# Patient Record
Sex: Female | Born: 1975
Health system: Southern US, Community
[De-identification: ages and names within clinical notes are randomized; demographics above are authoritative.]

## PROBLEM LIST (undated history)

## (undated) DIAGNOSIS — D509 Iron deficiency anemia, unspecified: Secondary | ICD-10-CM

## (undated) DIAGNOSIS — I10 Essential (primary) hypertension: Secondary | ICD-10-CM

## (undated) DIAGNOSIS — I639 Cerebral infarction, unspecified: Secondary | ICD-10-CM

## (undated) DIAGNOSIS — F209 Schizophrenia, unspecified: Secondary | ICD-10-CM

## (undated) DIAGNOSIS — E785 Hyperlipidemia, unspecified: Secondary | ICD-10-CM

## (undated) HISTORY — PX: OTHER SURGICAL HISTORY: SHX169

---

## 1998-10-07 ENCOUNTER — Emergency Department (HOSPITAL_COMMUNITY): Admission: EM | Admit: 1998-10-07 | Discharge: 1998-10-07 | Payer: Self-pay | Admitting: Emergency Medicine

## 1998-10-07 ENCOUNTER — Encounter: Payer: Self-pay | Admitting: Emergency Medicine

## 1998-10-08 ENCOUNTER — Encounter: Payer: Self-pay | Admitting: *Deleted

## 1998-10-31 ENCOUNTER — Ambulatory Visit (HOSPITAL_BASED_OUTPATIENT_CLINIC_OR_DEPARTMENT_OTHER): Admission: RE | Admit: 1998-10-31 | Discharge: 1998-11-01 | Payer: Self-pay | Admitting: Orthopedic Surgery

## 1998-11-11 ENCOUNTER — Encounter: Admission: RE | Admit: 1998-11-11 | Discharge: 1998-12-13 | Payer: Self-pay | Admitting: Orthopedic Surgery

## 1998-12-30 ENCOUNTER — Encounter: Admission: RE | Admit: 1998-12-30 | Discharge: 1999-01-20 | Payer: Self-pay | Admitting: Family Medicine

## 1999-05-08 ENCOUNTER — Inpatient Hospital Stay (HOSPITAL_COMMUNITY): Admission: AD | Admit: 1999-05-08 | Discharge: 1999-05-16 | Payer: Self-pay | Admitting: *Deleted

## 2000-05-11 ENCOUNTER — Other Ambulatory Visit: Admission: RE | Admit: 2000-05-11 | Discharge: 2000-05-11 | Payer: Self-pay | Admitting: Family Medicine

## 2003-10-15 ENCOUNTER — Other Ambulatory Visit: Admission: RE | Admit: 2003-10-15 | Discharge: 2003-10-15 | Payer: Self-pay | Admitting: Family Medicine

## 2005-03-05 ENCOUNTER — Other Ambulatory Visit: Admission: RE | Admit: 2005-03-05 | Discharge: 2005-03-05 | Payer: Self-pay | Admitting: Obstetrics and Gynecology

## 2007-05-16 ENCOUNTER — Other Ambulatory Visit: Admission: RE | Admit: 2007-05-16 | Discharge: 2007-05-16 | Payer: Self-pay | Admitting: Family Medicine

## 2007-06-26 ENCOUNTER — Inpatient Hospital Stay (HOSPITAL_COMMUNITY): Admission: EM | Admit: 2007-06-26 | Discharge: 2007-06-28 | Payer: Self-pay | Admitting: Emergency Medicine

## 2010-05-27 NOTE — H&P (Signed)
NAMEMELODE, MURRY              ACCOUNT NO.:  [id number]   MEDICAL RECORD NO.:  [id number]          PATIENT TYPE:  INP   LOCATION:  6734                         FACILITY:  [facility]   PHYSICIAN:  Jana Hakim, M.D. DATE OF BIRTH:  Apr 28, 1975   DATE OF ADMISSION:  06/26/2007  DATE OF DISCHARGE:                              HISTORY & PHYSICAL   PRIMARY CARE PHYSICIAN:  Unassigned.   CHIEF COMPLAINT:  Seizure.   HISTORY OF PRESENT ILLNESS:  This is a 35 year old female with a  psychiatric history currently being seen, followed and managed by  psychiatric services; who was described by her parents as suffering an  episode at 8:15 p.m..  They report hearing her fall to the floor, and  they went and found her shaking all over and unresponsive.  Of note, the  patient has had no previous history of seizures.  The patient reportedly  had been in good health up until 3 weeks ago, when she began to have an  upper respiratory illness/bronchitis.  The patient reportedly has had  increased cough and fevers and chills.   MEDICATIONS:  1. Fluphenazine 2.5 mg one p.o. q.h.s.  2. Clorazepate 100 mg q.h.s.   ALLERGIES:  NO KNOWN DRUG ALLERGIES.   SOCIAL HISTORY:  The patient lives with her parents.  She is a  nonsmoker, nondrinker.   FAMILY HISTORY:  Noncontributory.   REVIEW OF SYSTEMS:  Pertinents were mentioned above.  The patient has  not had any report of nausea, vomiting, diarrhea, constipation, chest  pain or shortness of breath.   PHYSICAL EXAMINATION FINDINGS:  This is a 35 year old well-nourished,  well-developed female who is currently obtunded, but in no acute  distress.  VITAL SIGNS:  Temperature 97.9, blood pressure 117/79, heart rate 102,  respirations 25, O2 saturations 100%.  HEENT:  Normocephalic, atraumatic.  Pupils are sluggish but reactive to  light.  Extraocular movements unable to assess at this time.  Oropharynx  id dry.  No exudate.  NECK:  Supple, full range  of motion.  No thyromegaly, adenopathy or  jugular venous distention.  CARDIOVASCULAR:  Regular rate and rhythm  with mild tachycardia.  No murmurs, gallops or rubs.  LUNGS:  Decreased  rhonchorous breath sounds, no rales nor wheezes.  ABDOMEN:  Positive bowel sounds, soft, nontender, nondistended.  No  hepatosplenomegaly.  EXTREMITIES: Without cyanosis, clubbing or edema.  NEUROLOGIC EXAMINATION:  The patient is obtunded, unable to assess right  now secondary to sedation.   LABORATORY STUDIES:  White blood cell count 12.6, hemoglobin 8.8,  hematocrit 26.6, platelets 268, neutrophils 79%.  Sodium 141, potassium  3.2, chloride 110, bicarb 22, BUN 14, creatinine 2.02, glucose 150.  Urinalysis reveals small leukocyte esterase.  Urine drug screen  negative.  Alcohol level less than 5.0.  CT scan of the head was  performed, results of which were negative for any acute changes or  intracranial bleed/hemorrhage.   CHEST X-RAY:  Negative for any acute cardiopulmonary disease findings.   ASSESSMENT:  A 35 year old female being admitted with:  1. Seizure.  2. Altered mental status.  3. Leukocytosis.  4. Anemia.  5. Acute with chronic renal insufficiency.  6. Mild hypokalemia.   PLAN:  The patient will be admitted to an area for cardiac and pulmonary  monitoring. Neurologic checks will be performed.  The patient will be  placed on IV antibiotic therapy of Zosyn, secondary to possible  aspiration which may not be visible on chest x-ray at this time.  Nebulizer treatments have also been ordered and supplemental oxygen.  The patient is n.p.o. now while she is obtunded.  IV Keppra therapy has  also been ordered.  IV fluids have been ordered to help correct her  electrolytes and her BUN and creatinine.  Further supplementation will  occur once the patient is more oriented.  An anemia workup will also be  started as well in.  DVT and GI prophylaxis have also been ordered.      Jana Hakim, M.D.  Electronically Signed     HJ/MEDQ  D:  06/26/2007  T:  06/26/2007  Job:  [id number]

## 2010-05-27 NOTE — Consult Note (Signed)
Debra Klein              ACCOUNT NO.:  [id number]   MEDICAL RECORD NO.:  [id number]          PATIENT TYPE:  INP   LOCATION:  6734                         FACILITY:  [facility]   PHYSICIAN:  Felizardo Hoffmann, M.D.  DATE OF BIRTH:  1975/07/17   DATE OF CONSULTATION:  06/28/2007  DATE OF DISCHARGE:                                 CONSULTATION   REFERRING PHYSICIAN:  Sheila Oats, M.D.   REASON FOR CONSULTATION:  Seizure in a patient with schizophrenia while  on Clozaril and Prolixin.  Please reevaluate medication regimen.   HISTORY OF PRESENT ILLNESS:  [name] is a 35 year old female  who was admitted to the [facility] on June 25, 2007, with  altered mental status and further evaluation of a convulsion.   The patient had a witnessed generalized tonic-clonic seizure at home.  She was brought to the emergency room.  While in the emergency room, she  was combative and paranoid, however, this did resolve.   Now, the patient is calm and cooperative.  She is not having any  hallucinations or delusions.  She does not have any thoughts of harming  herself or others.  Her interests are intact.  She does continue with a  mildly flat affect with occasional inappropriate smiling; however, she  demonstrates that she can perform her activities of daily living.   The patient was initially discontinued on her Prolixin and Clozaril.  She was initially started on Keppra, however, the Keppra has been  discontinued and now the patient is back on her regimen of Clozaril 100  mg nightly and Prolixin 2.5 mg nightly.   PAST PSYCHIATRIC HISTORY:  The patient had her first psychotic break in  1996.  This consisted of severe auditory hallucinations.  She required  one psychiatric hospitalization.  She has never tried to harm herself.   Her past antipsychotic medications have included Geodon, Seroquel,  Risperdal, Prolixin and Clozaril.   Once the patient had her first  psychotic break, she never had total  resolution of the auditory hallucinations until Clozaril was added.  Five years ago, Clozaril was added to her Prolixin and this was  associated with a complete discontinuation of her auditory  hallucinations.   Most recently, her maintenance antipsychotic regimen has been Prolixin  2.5 mg p.o. nightly and Clozaril 100 mg p.o. nightly.  She has never had  any seizures until now.  She has been having her CBC drawn once every  month.   FAMILY PSYCHIATRIC HISTORY:  None known.   SOCIAL HISTORY:  The patient is single.  She lives at home with her  parents.  She does not do any alcohol or illegal drugs.  She has no  children.  She does have a close friend.  She did attend some college,  but had to discontinue.   PAST MEDICAL HISTORY:  The patient has a history of an accidental fall,  where she sustained a left ankle fracture and required open reduction,  internal fixation.  Otherwise, past medical history is unremarkable.   ALLERGIES:  NO KNOWN DRUG ALLERGIES.   MEDICATIONS:  [facility] is reviewed.   PSYCHOTROPIC MEDICATIONS:  1. Clozaril 100 mg nightly.  2. Prolixin 2.5 mg nightly.   The patient also is on Ativan 1-2 mg q.4 h., p.r.n.   A head CT without contrast showed no acute abnormality   WBC 6.7, hemoglobin 7.8, platelet count 211, sodium 142, BUN 9,  creatinine 1.47.  TSH within normal limits.  SGOT 18, SGPT 9.  B12  within normal limits.  Folic acid within normal limits.  Serum HCG  negative.  Alcohol negative.  Urine drug screen negative.   REVIEW OF SYSTEMS:  Constitutional, head, eyes, ears, nose, throat,  mouth, neurologic, psychiatric cardiovascular, respiratory,  gastrointestinal, genitourinary, skin, hematologic, lymphatic, endocrine  metabolic musculoskeletal are all unremarkable.   PHYSICAL EXAMINATION:  VITAL SIGNS:  Temperature 99.1, pulse 95,  respiratory rate 18, blood pressure 133/95.  O2 saturation on room air   99%.   MENTAL STATUS EXAM:  Debra Klein is alert.  Her attention is within  normal limits.  Her eye contact is good.  Her affect is mildly flat at  baseline and then there is an occasional inappropriate smile.  Her  memory function is intact to immediate, recent and remote.  Her fund of  knowledge and intelligence are slightly below her estimated pre-  psychotic baseline.  Her speech involves a mildly flat prosody without  dysarthria.  Speech is normal rate.  Mood is within normal limits.  Thought process logical, coherent and goal-directed.  No looseness of  associations.  Thought content; no thoughts of harming herself, no  thoughts of harming others, no delusions, no hallucinations.   Insight, partial judgment is grossly intact.  However, the patient would  have some difficulty making mildly complicated medication decisions.  Therefore, the patient agrees to have the undersigned discussed  medication decisions with her parents.   ASSESSMENT:  Axis I:  293.82, psychotic disorder, not otherwise  specified with hallucinations well-controlled.  This condition likely is  schizophrenia, chronic undifferentiated type in current remission except  for some negative symptoms.  Axis II:  None.  Axis III:  See past medical history.  Axis IV:  General medical.  Axis V:  Global Assessment of Functioning 55.   The undersigned provided ego supportive psychotherapy and education.   The undersigned discussed the indications, alternatives and adverse  effects of Clozaril and Prolixin, including the risk of lethal blood  dyscrasias and seizures.   The option of discontinuing the Clozaril versus adding an anticonvulsant  versus not adding an anticonvulsant were all discussed with the  patient's mother and her father.  They understand and would like to  continue the antipsychotic regimen of Clozaril as it is while being  placed on an anticonvulsant for prevention of seizure.   The undersigned  discussed this plan with Dr. Brett Fairy or neurology.  Neurology has already consulted on the patient and Dr. Brett Fairy will  proceed with adding Keppra.   The patient will continue on her Clozaril 100 mg nightly and Prolixin  2.5 mg nightly for antipsychotic maintenance   Would ask the social worker to schedule this patient with her outpatient  psychiatrist within 1 week of discharge.  The patient is psychiatrically  cleared for discharge.      Felizardo Hoffmann, M.D.  Electronically Signed     JW/MEDQ  D:  06/28/2007  T:  06/28/2007  Job:  [id number]

## 2010-05-27 NOTE — Consult Note (Signed)
[name], [name]              ACCOUNT NO.:  [id number]   MEDICAL RECORD NO.:  [id number]          PATIENT TYPE:  INP   LOCATION:  6734                         FACILITY:  [facility]   PHYSICIAN:  Debra Bruins. Hickling, M.D.DATE OF BIRTH:  1975-06-15   DATE OF CONSULTATION:  06/27/2007  DATE OF DISCHARGE:                                 CONSULTATION   CHIEF COMPLAINT:  Single seizure.   HISTORY OF PRESENT CONDITION:  Debra Klein is a 35 year old young woman who  had onset of schizophrenia in her freshman year of college.  She had  visual hallucinations and was placed on neuroleptic medications.  I am  unaware of whether or not she ever had paranoid ideation.   The patient was at home with her parents.  They checked on her to make  certain that she had taken her neuroleptic medicines.  Less than an hour  later they heard a fall in her room and came to view her lying on the  floor around 8:15 p.m. in the midst of a generalized tonic-clonic  seizure.   Her parents described shaking that was rhythmic involving her arms and  her legs.  Her eyelids were slightly open.  Eyes were rolled up in her  head.  She had saliva coming from mouth.  She did not bite her tongue.  I am are unaware of urinary incontinence.   The patient was transported by EMS to [facility].  Parents  thought that she had seizures for about 20 minutes.  EMS note documents  that they were contacted at 2034 hours and they arrived to assess her at  2049 hours, so 15 minutes as early possible.  They noted that the  patient was bizarre, paranoid, and combative.  It seems to me that the  seizure may have been over at that time, but parents thought that she  was still having seizures.  They describe rhythmic jerking and not  thrashing.   The patient had no other definite abnormalities in the physical  examination.  She was placed on a non-rebreather mask.  Attempts were  made to put an IV in place and were successful.  She  had a regular sinus  rhythm.  She was transported to [facility].  She did not have  further seizures.  She was loaded with IV Keppra.  She required IV  Geodon to deal with her combativeness.  She has not received her regular  medicines since that time.   I was asked to see her to determine etiology of her dysfunction, make  recommendations for further workup and treatment.   Concerns were raised about the possibility of aspiration pneumonia, but  fortunately that did not occur.  The patient did have elevated white  count related to the seizure itself and demargination of white blood  cells.  She had evidence of renal failure with creatinine of 1.84 and  BUN of 13.  She had mild hypokalemia.  She has normocytic anemia,  hemoglobin 8.9 and apparently has iron deficiency based on low iron and  normal total iron-binding capacity.   PAST  MEDICAL HISTORY:  The patient has had one serious injury where she  fell off the roof where she was trying to clean the gutters.  She had a  comminuted fracture, compound fracture of her left ankle that required  open reduction and internal fixation and pinning.  She has had no other  surgeries.  No other hospitalizations.   FAMILY HISTORY:  Negative for seizures, schizophrenia, or other  neurologic or psychiatric disorders.   PHYSICAL EXAMINATION:  GENERAL:  Pleasant well-developed right-handed  woman in no distress.  VITAL SIGNS:  Weight 83.7 kg, temperature 98.6, blood pressure 135/86  resting, pulse 79, respirations 16, and oxygen saturation 99%.  EAR, NOSE, and THROAT:  No bruits.  No infections.  No meningismus.  LUNGS:  Clear.  HEART:  No murmurs.  Pulses normal.  ABDOMEN:  Soft and protuberant.  Bowel sounds normal.  No  hepatosplenomegaly.  EXTREMITIES:  Unremarkable.  NEUROLOGIC:  Awake and alert.  Flat affect.  No dysphasia or dyspraxia.  Cranial nerves, round reactive pupils.  Fundi normal.  Visual fields  full to double  simultaneous stimuli.  Symmetric facial strength.  Midline tongue and uvula.  Air conduction greater than bone conduction  bilaterally.  Motor examination, normal strength, mass.  Good fine motor  movements.  No pronator drift.  Sensation intact to cold vibration,  stereognosis, and proprioception.  Cerebellar examination good finger-to-  nose and rapid repetitive altering movements.  Gait not tested.  Deep  tendon reflexes were diminished.  The patient had bilateral flexor  plantar responses.   IMPRESSION:  1. Single seizure not deathly epilepsy, 780.39.  The duration      approached a diagnosis of status epilepticus, 345.3.  2. A 14- to 15-year history of schizophrenia.  3. Recent addition of Prolixin, I doubt that this was the cause of her      seizure.  4. Her EEG shows diffuse background slowing related to a postictal      state.  5. CT scan of the brain was normal.  6. Labs demonstrate normocytic anemia, transient leukocytosis, renal      failure grade 2, and hypokalemia.   RECOMMENDATIONS:  1. Discontinue Keppra.  2. Have nursing to demonstrate the use of rectal Diastat.  Call the      pediatrics floor as needed to help.  3. Diastat 20 mg AcuDial 6.0 cm tip 17.5 mg at the onset of her      seizures delivered rectally.  4. Restart Prolixin 2.5 mg at bedtime and Clozaril 100 mg at bedtime.      I agree with need for psychiatric consult to get Dr. Ahmed Prima      informed and to determine whether or not the neuroleptic agents      will be changed.  I discussed at 1615 hours with Dr. Dillard Essex.      Debra Klein, M.D.  Electronically Signed     WHH/MEDQ  D:  06/27/2007  T:  06/28/2007  Job:  [id number]   cc:   Sheila Oats, M.D.

## 2010-05-27 NOTE — Procedures (Signed)
EEG NUMBER:  [id number].   CLINICAL HISTORY:  The patient is a 35 year old with a history of  schizophrenia dating back nearly 15 years.  The patient had a  generalized tonic-clonic seizure witnessed by her parents.  She had a  urinary tract infection for 3 weeks, evidence of anemia, and  leukocytosis. (780.39)   PROCEDURE:  The tracing is carried out on a 32-channel digital Cadwell  recorder reformatted into 16-channel montages with one devoted to EKG.  The patient was awake and drowsy and asleep during the recording.  The  international 10/20 system lead placement was used.   MEDICATIONS:  Include Protonix, Keppra, Zosyn, Zofran, Tylenol,  Dilaudid, and Ativan.   DESCRIPTION OF FINDINGS:  Dominant frequency in the early record is a 6  Hz, 10-15 microvolt activity that is broadly distributed superimposed  upon this is mixed frequency will lower theta per delta range activity.  There is no focality in the background.  The patient drifts into natural  sleep with vertex sharp waves and brief spindles.  Background shows  regularly contoured delta range activity.  The patient arouses toward  the end of the record and the dominant frequency is a 7 Hz, 15 microvolt  activity again with mixed frequency theta, but less delta range  activity.  There is no focal slowing.  There was no interictal  epileptiform activity in the form of spikes or sharp waves.  Activating  procedures were not carried out.  EKG showed a sinus tachycardia with  ventricular response of 96 beats per minute.   IMPRESSION:  Abnormal EEG on the basis of mild diffuse background  slowing.  This is a nonspecific indicator of neuronal dysfunction maybe  on a primary degenerative basis in this case may be related either to  underlying static encephalopathy or to medication effect or to a  postictal state.      Princess Bruins. Gaynell Face, M.D.  Electronically Signed     [id number]  D:  06/27/2007 16:03:59  T:  06/28/2007  00:04:36  Job #:  [id number]   cc:   Hettie Holstein  Fax: [phone]

## 2010-05-30 NOTE — Discharge Summary (Signed)
[place]  Patient:    Debra Klein, Debra Klein                       MRN: [id number] Adm. Date:  [id number] Disc. Date: [id number] Attending:  Cyndi Bender Dictator:   Dennard Nip, NP                           Discharge Summary  HISTORY OF PRESENT ILLNESS:  The patient is a 35 year old single black female admitted secondary to increase in psychotic symptoms and delusional thinking. The patient reportedly has been deteriorating per family and therapist x 2 weeks.  The family reports that she is hearing voices, is having decreased sleep somewhere from 2-4 hours a night.  She is acting compulsively, taking multiple showers and washing her hair 2-3 times a day.  Per assessment team, when she was originally interviewed on May 08, 1999, she talked to herself throughout the interview.  Today, she presents stating that in 1996 she began to be bothered by the "devil."  She states recently she does think that she has been having nightmares but does not feel like they were auditory and visual hallucinations.  But then, she goes on to state perhaps they were.  She told the therapist on May 08, 1999 that she was having nightmares about being raped and expressing a lot of religious preoccupation.  She denies auditory and visual hallucinations.  She states she is not suicidal or homicidal.  She is very disorganized and she presents somewhat guarded. However, her affect is incongruent with what she states.  She is somewhat restless during the interview and, at one point, she does appear to be talking to herself.  The patient is a client at [facility] and her psychiatrist is Dr. Mariel Kansky.  She has had no previous inpatient treatment.  Her therapist is Dr. Ezra Sites.  PAST MEDICAL HISTORY:  Patient states that her primary care Chelsey [place]ondo is Claudie Leach.  She states that she has no medical problems.  ADMISSION MEDICATIONS:  Current medications  include Seroquel, dose unknown. She states that she takes four tablets.  ALLERGIES:  She reported being allergic to no known drugs.  Physical examination was done on May 08, 1999.  REVIEW OF SYSTEMS:  Essentially benign.  She denied upper respiratory tract infection.  She denied hoarseness, pain or lesions.  She denied chest pain and palpitations and hypertension.  PULMONARY:  She is a nonsmoker.  No cough.  No dyspnea. GASTROINTESTINAL:  Positive for ulcer in the second grade. MUSCULOSKELETAL:  She denied any problems.  She did have a history of compound fracture of left leg.  She denied seizures and basically, again, she had a benign review of systems.  PHYSICAL EXAMINATION:  GENERAL:  Within normal limits.  SKIN:  Somewhat dry.  There were benign keratoses noted to the left lower quadrant of her abdomen.  She did have a grade 1.5 murmur auscultated at the apex.  NEUROLOGIC:  Within normal limits.  Physical exam was within normal limits without significant findings other than those mentioned above.  LABORATORY FINDINGS:  Patient had a CBC with a differential.  Her hemoglobin was low at 11.3.  Her MCHC was low at 31.0.  Otherwise, within normal limits. Her chemistry profile was within normal limits except BUN which was low at 5. Her T3 and TSH were within normal limits.  Her drug screen was negative. Urinalysis was  within normal limits with a trace of esterase and she did have a few epithelial cells.  Her EKG was done and it basically showed normal sinus rhythm with a short PR; otherwise normal.  MENTAL STATUS EXAMINATION:  On admission, patient was a casually dressed young, black female who appeared restless.  Stood up intermittently and looked as though she was going to try to leave the interview.  However, she is polite with this interviewer as well as cooperative.  Eyes darted around the room and, at one point, she does appear to start talking to herself with a very  low voice.  Speech is normal rate and tone.  She can be somewhat circumstantial but, overall, she is able to answer the questions.  Mood and affect are incongruent.  She smiles superficially.  She appears very psychotic.  Her thought processes are positive for delusions.  Again, she is describing them as nightmares about being raped and the devil.  She is negative for suicidal ideation, homicidal ideation or auditory or visual hallucinations per her, although she does appear to be attending to internal stimuli.  Her thought processes are somewhat disorganized.  Cognitive function appears to be intact. Alert and oriented.  She presents with limited insight and judgment.  ADMITTING DIAGNOSES: Axis I:    Schizophrenia, disorganized. Axis II:   Deferred. Axis III:  None. Axis IV:   Severe (related to medical problems). Axis V:    Current GAF is 25; highest in past year is 24.  HOSPITAL COURSE:  The patient was admitted to the [place] unit for treatment of her psychotic symptoms.  Routine lab work was ordered and she was started on Seroquel 800 mg p.o. q.h.s. with Risperdal 1 mg stat, Cogentin 1 mg p.o. b.i.d. p.r.n. and Restoril 30 mg p.o. q.h.s. p.r.n.  We did do a baseline EKG on the day after admission.  We had to increase her Risperdal 0.5 mg p.o. q.h.s. on May 10, 1999.  On May 12, 1999, we did discharge her Restoril and, due to the fact the she did not need any sleeping medication, on May 13, 1999, we ordered Ativan 2 mg p.o. q.4-6h. p.r.n. for agitation.  On May 13, 1999, we went ahead and discontinued the Risperdal and decreased the Seroquel to 600 mg q.h.s. and added Geodon 20 mg b.i.d.  On May 15, 1999, we upped her Geodon to 40 mg b.i.d., decreased the Seroquel 400 mg q.h.s.   On May 16, 1999, it was decided that she could be discharged and could continue to be safe.  While she was in the hospital, she has a long history of schizophrenia and she has chronic psychotic  symptoms which have recently exacerbated with increased hallucinations, nightmares and increased paranoia.  Has been on Seroquel  800 mg a day.  Family says she has been compliant.  Will review office chart to review medications as patient commits to recall.  On May 10, 1999, the patient denied any problems but there apparently were some prior to admission. Patient denies hearing voices and denies any paranoia but discussed her reoccurrence of symptoms even on the Seroquel.  At this point was when the Risperdal was added and the EKG was ordered.  On May 11, 1999, she continued to state she had no hallucinations but did have nightmares.  She appeared less likely to be at attending to internal stimuli.  She is not muttering to herself or looking around the room as much.  Sleep is still  poor, tolerating Risperdal.  On the ____, patient appeared to be improving.  Denied current hallucinations.  Still requiring Restoril to sleep.  Appears more alert, less preoccupied and tolerating medications well and patient was evaluated if she could sleep without the Restoril.  On May 13, 1999, she did become quite agitated.  On the evening of May 12, 1999, she was praying outloud to God, restless, unable to sleep and she did require p.r.n. medication to calm her down.  During the morning, she was somewhat withdrawn, hyperreligious and appeared guarded.  She minimized episode last night.  She still denies hallucinations.  Patient continues to have psychiatric problems despite the 800 mg of Seroquel and that was when Risperdal and Geodon was added.  EKG showed a normal QT.  On May 14, 1999, she continued to be psychotic, staying to herself, responding to internal stimuli, but she was tolerating the Geodon.  On May 15, 1999, she again became agitated on the night before requiring p.r.n. medication; in bed this morning, withdrawn and preoccupied into forthcoming symptoms.  She was tolerating Geodon well so  far. Concerned that patient remained psychotic, subject to further decompensation. Therefore, the Geodon was increased and the Seroquel was decreased.  On May 16, 1999, she said she felt wonderful, slept eight hours last night with the current medications.  Denied nightmares.  Denied hallucinations.  Still rather seclusive.  Appears also preoccupied, making some inappropriate statements but many represent more of her baseline.  Is responsive this morning, smiling, showing some affect, still guarded, evasive.  She is tolerating her medications well and it was decided that she would be discharged on May 17, 1999.  CONDITION ON DISCHARGE:  The patient is discharged in an improved condition with improvement in her mood, sleep, appetite, alleviation of any suicidal or homicidal ideation although she still remained somewhat guarded and somewhat paranoid but did not appear to be a danger to herself or others.  DISPOSITION:  The patient is discharged to home.  DISCHARGE MEDICATIONS: 1. Geodon 40 mg 1 tablet q.a.m. 2. Seroquel 200 mg 4 tabs q.h.s.  FOLLOW-UP:  She is to contact [facility] for any problems and to see Dr. Mariel Kansky on May 28, 1999 at 9:45 a.m.  FINAL DIAGNOSES: Axis I:    Schizophrenia, disorganized. Axis II:   Deferred. Axis III:  None. Axis IV:   Severe (related to medical problems). Axis V:    Current GAF is 50; highest in past year is 56. DD:  05/21/99 TD:  05/26/99 Job: [id number]

## 2010-05-30 NOTE — H&P (Signed)
[place]  Patient:    Debra Klein, Debra Klein                       MRN: [id number] Adm. Date:  [id number] Attending:  Cyndi Bender Dictator:   Romona Curls, N.P.                         History and Physical  ALLERGIES:  No known allergies.  REVIEW OF SYSTEMS:  HEAD:  Denies headaches, dizziness, head trauma.  EYES: Denies visual changes.  The patient does not wear corrective lenses.  EARS: Denies hearing loss, earaches or tinnitus.  NOSE:  Denies upper respiratory tract symptoms, epistaxis or loss of smell.  THROAT:  Denies hoarseness, pain and lesions.  Dentition, denies gum infections or tooth aches.  SKIN:  She reports is dry.  There has been no abnormal bruising, rashes, swelling, or lesions noted.  CARDIOVASCULAR:  Denies chest pain, previous MI, palpitations, murmur, or hypertension.  PULMONARY:  She is a nonsmoker.  She had no cough. She denies dyspnea.  She has no history of asthma, bronchitis, or pneumonia. Denies night sweats.  GASTROINTESTINAL:  She states she had an ulcer in the second grade.  She denies melena, hematemesis, nausea, vomiting, diarrhea, no abdominal pain or food intolerance.  GENITOURINARY:  Denies dysuria, urgency or frequency.  Last menstrual period was April 10, 1999.  MUSCULOSKELETAL: Denies current back pain, joint pain or weakness.  She does have a history of a compound fracture, left leg.  Nervous system, denies seizures, hematopoietic.  Denies ______.  The patient does not have sickle cell or sickle cell trait.  No history of receiving a blood transfusion.  ENDOCRINE: Denies heat or cold intolerance, bowel changes.  No polyuria, polydipsia or polyphaga.  PHYSICAL EXAMINATION:  VITAL SIGNS:  Temperature 99.1, pulse 95, respirations 20, blood pressure 114/78.  GENERAL APPEARANCE:  The patient is a well-developed, black female.  She is alert and cooperative in no acute distress.  SKIN:  Somewhat dry in nature.   There is a benign keratoses noted to left lower quadrant of her abdomen, otherwise there was no abnormal bruising, rashes or swelling noted.  HEENT:  Head was normocephalic.  Extraocular movements were intact.  Pupils equal, round, and reactive to light.  Funduscopic examination was benign. Ears:  Canals are clear.  Tympanic membranes exhibit normal landmarks.  Nares were clear.  They were slightly erythematous.  Throat:  Buccal mucosa was clear.  There were no lesions noted.  Tongue was in midline.  Dentition is in good repair.  NECK:  Supple with trachea in midline.  Thorax:  Bilateral symmetrical expansion.  LUNGS:  Clear to auscultation.  HEART:  G 1.5 murmur auscultated at apex.  VASCULAR:  Pulses were equal.  No bruits were auscultated carotid aortic. There was no dependent edema.  ABDOMEN:  Soft.  There was no tenderness.  Bowel sounds x 4.  No masses. Liver, no organomegaly noted.  GLANDULAR:  Negative adenopathy at cervical, supraclavicular or axillary palpable thyroid.  BONES AND JOINTS:  There was no swelling, tenderness or deformity noted.  MUSCULOSKELETAL:  There was no atrophy.  Strength was equal bilaterally, 5/5.  EXTREMITIES:  Full range of motion.  No cyanosis or clubbing.  NEUROLOGICAL:  Cranial nerves 2-12 are grossly intact.  Cerebellar function is intact.  Reflexes were 2+.  Negative Romberg.  Gait within normal limits.  IMPRESSION:  No significant findings.  Normal physical examination. DD:  05/12/99 TD:  05/12/99 Job: [id number]

## 2010-05-30 NOTE — H&P (Signed)
[place]  Patient:    Debra Klein, Debra Klein                       MRN: [id number] Adm. Date:  [id number] Attending:  Cyndi Bender Dictator:   Romona Curls, N.P.                   Psychiatric Admission Assessment  DATE OF ADMISSION:  May 08, 1999  PATIENT IDENTIFICATION:  The patient is a 35 year old single black female admitted secondary to increase in psychotic behavior and delusional thinking.  HISTORY OF PRESENT ILLNESS:  The patient reportedly has been deteriorating per family and therapist x 2 weeks.  The family reports that she is hearing voices, is having decreased sleep, anywhere from only two to four hours a night.  She is acting compulsively, taking multiple showers, and washing her hair two to three times a day.  Per assessment team, when she was originally interviewed on May 08, 1999, she talked to herself throughout the interview. Today she presents stating that in 1996, she began to be bothered by "the devil."  She states recently she does think she has been having nightmares, but does not feel like they were auditory and visual hallucinations, but then she goes on to state perhaps they were.  She told her therapist on May 08, 1999, that she was having nightmares about being raped, and expressing a lot of religious preoccupation.  She denies, still, auditory and visual hallucinations.  She states she is not suicidal or homicidal.  She is very disorganized, and she presents somewhat guarded; however, her affect is incongruent with what she states.  She is somewhat restless during the interview and, at one point, she does appear to be talking to herself.  PAST PSYCHIATRIC HISTORY:  She is a client of [facility], and her attending psychiatrist is Dr. Mariel Klein.  She has had no previous inpatient treatment.  Her therapist is Dr. Bennie Klein.  SOCIAL HISTORY:  She currently lives with her parents.  She has completed  high school, and she attend one year in college.  Apparently, at that point, this was in 1996, that she had her first psychotic break.  She states she has no boyfriend, but she does have a good girlfriend that she likes to go do things with, and she is looking forward, or attempting to try to go to a training program at [employer] for future employment.  FAMILY HISTORY:  Per intake, there are some uncles who have alcoholism.  No other known psychiatric history.  ALCOHOL AND DRUG HISTORY:  The patient denies any recent or remote use of alcohol or drugs.  PRIMARY CARE Debra Klein:  Claudie Leach.  PAST MEDICAL HISTORY:  Patient states none.  MEDICATIONS:  Current miscommunications regarding patients current Seroquel dose.  Patient states she takes four tablets.  ALLERGIES:  No known allergies.  PHYSICAL EXAMINATION:  Physical exam is pending, given patients current delusional and psychotic state.  MENTAL STATUS EXAMINATION:  Appearance and behavior:  She is a casually-dressed young black female.  She appears somewhat restless.  She stands up intermittently, and looks as though she is going to try to leave the interview; however, she is polite with this Probation officer, as well as cooperative. Her eyes dart around the room and, at one point, she does appear to start talking to herself in a very low voice.  Her speech is normal rate and tone. She can be somewhat circumstantial but,  overall, she is able to answer the questions.  Her mood and affect are incongruent.  She smiles superficially. She appears very psychotic.  Her thought processes are positive for delusions. Again, she is describing them as nightmares about being raped and the devil. She is negative for suicidal ideation, homicidal ideation, and auditory and visual hallucinations for her, although she does appear to be attending to internal stimuli.  Her thought processes are somewhat disorganized.  Cognitive function appears to be  intact.  She is alert and oriented.  She presents with limited insight and judgment.  ADMISSION DIAGNOSES: Axis I:    Schizophrenia, disorganized. Axis II:   Deferred. Axis III:  None. Axis IV:   Severe, related to medical problems. Axis V:    Current global assessment of functioning is 25; highest in the past            year is 54.  TREATMENT PLAN AND RECOMMENDATIONS:  We will voluntarily admit [name] to [facility] for stabilization.  Fifteen-minute checks will be provided for safety.  We will clarify the patients current Seroquel order with attending psychiatrist.  Risperdal 1 mg now dose was given on admission. Cogentin 1 mg b.i.d. p.r.n., and Restoril 30 mg q.h.s. for sleep.  Laboratory values of CBC, CMET, UA, urinalysis, TSH, T3, T4 are currently pending.  TENTATIVE LENGTH OF STAY AND DISCHARGE PLANS:  Three to four days, with follow-up at [facility], as well as Dr. Bennie Klein. DD:  05/09/99 TD:  05/12/99 Job: [id number]

## 2010-10-09 LAB — DIFFERENTIAL
Basophils Absolute: 0
Basophils Relative: 0
Blasts: 0
Eosinophils Absolute: 0
Lymphocytes Relative: 13
Lymphocytes Relative: 15
Lymphocytes Relative: 28
Lymphs Abs: 1.9
Lymphs Abs: 1.9
Monocytes Relative: 8
Myelocytes: 0
Neutro Abs: 9.9 — ABNORMAL HIGH
Neutro Abs: 4.7
Neutrophils Relative %: 79 — ABNORMAL HIGH
Neutrophils Relative %: 81 — ABNORMAL HIGH
Neutrophils Relative %: 68
Promyelocytes Absolute: 0
nRBC: 0

## 2010-10-09 LAB — BASIC METABOLIC PANEL
BUN: 13
BUN: 14
BUN: 5 — ABNORMAL LOW
CO2: 22
CO2: 20
CO2: 22
Calcium: 8.5
Calcium: 8.4
Calcium: 8.9
Chloride: 110
Chloride: 115 — ABNORMAL HIGH
Creatinine, Ser: 1.84 — ABNORMAL HIGH
Creatinine, Ser: 2.02 — ABNORMAL HIGH
Creatinine, Ser: 1.47 — ABNORMAL HIGH
Creatinine, Ser: 1.52 — ABNORMAL HIGH
GFR calc Af Amer: 35 — ABNORMAL LOW
GFR calc Af Amer: 48 — ABNORMAL LOW
GFR calc non Af Amer: 29 — ABNORMAL LOW
GFR calc non Af Amer: 32 — ABNORMAL LOW
Glucose, Bld: 150 — ABNORMAL HIGH
Glucose, Bld: 92
Glucose, Bld: 94
Potassium: 3.2 — ABNORMAL LOW
Sodium: 141

## 2010-10-09 LAB — CBC
HCT: 26.6 — ABNORMAL LOW
Hemoglobin: 8.8 — ABNORMAL LOW
Hemoglobin: 7.8 — CL
MCHC: 33
MCHC: 33
MCHC: 34.1
MCV: 83
MCV: 83.5
Platelets: 268
Platelets: 269
Platelets: 239
RBC: 3.21 — ABNORMAL LOW
RBC: 3.03 — ABNORMAL LOW
RDW: 15.7 — ABNORMAL HIGH
RDW: 16.4 — ABNORMAL HIGH
RDW: 16.4 — ABNORMAL HIGH
WBC: 12.4 — ABNORMAL HIGH
WBC: 12.6 — ABNORMAL HIGH

## 2010-10-09 LAB — HEPATIC FUNCTION PANEL
ALT: 9
Bilirubin, Direct: 0.2
Indirect Bilirubin: 0.3
Total Bilirubin: 0.5

## 2010-10-09 LAB — IRON AND TIBC
Iron: 17 — ABNORMAL LOW
Saturation Ratios: 20
TIBC: 216 — ABNORMAL LOW
TIBC: 212 — ABNORMAL LOW
UIBC: 170

## 2010-10-09 LAB — VITAMIN B12: Vitamin B-12: 463 (ref 211–911)

## 2010-10-09 LAB — RAPID URINE DRUG SCREEN, HOSP PERFORMED
Amphetamines: NOT DETECTED
Benzodiazepines: NOT DETECTED

## 2010-10-09 LAB — URINALYSIS, ROUTINE W REFLEX MICROSCOPIC
Glucose, UA: NEGATIVE
Protein, ur: NEGATIVE
Urobilinogen, UA: 0.2

## 2010-10-09 LAB — URINE MICROSCOPIC-ADD ON

## 2010-10-09 LAB — FERRITIN: Ferritin: 22 (ref 10–291)

## 2010-10-09 LAB — ETHANOL: Alcohol, Ethyl (B): <5

## 2010-10-09 LAB — RETICULOCYTES: Retic Ct Pct: 0.5

## 2010-10-09 LAB — FOLATE: Folate: 6.7

## 2011-02-03 DIAGNOSIS — F200 Paranoid schizophrenia: Secondary | ICD-10-CM | POA: Diagnosis not present

## 2011-02-10 DIAGNOSIS — F200 Paranoid schizophrenia: Secondary | ICD-10-CM | POA: Diagnosis not present

## 2011-02-19 DIAGNOSIS — F4310 Post-traumatic stress disorder, unspecified: Secondary | ICD-10-CM | POA: Diagnosis not present

## 2011-02-19 DIAGNOSIS — F339 Major depressive disorder, recurrent, unspecified: Secondary | ICD-10-CM | POA: Diagnosis not present

## 2011-03-13 DIAGNOSIS — F200 Paranoid schizophrenia: Secondary | ICD-10-CM | POA: Diagnosis not present

## 2011-04-15 DIAGNOSIS — F200 Paranoid schizophrenia: Secondary | ICD-10-CM | POA: Diagnosis not present

## 2011-05-13 DIAGNOSIS — F339 Major depressive disorder, recurrent, unspecified: Secondary | ICD-10-CM | POA: Diagnosis not present

## 2011-05-13 DIAGNOSIS — F4310 Post-traumatic stress disorder, unspecified: Secondary | ICD-10-CM | POA: Diagnosis not present

## 2011-05-14 DIAGNOSIS — F339 Major depressive disorder, recurrent, unspecified: Secondary | ICD-10-CM | POA: Diagnosis not present

## 2011-05-14 DIAGNOSIS — F4310 Post-traumatic stress disorder, unspecified: Secondary | ICD-10-CM | POA: Diagnosis not present

## 2011-05-20 DIAGNOSIS — D509 Iron deficiency anemia, unspecified: Secondary | ICD-10-CM | POA: Diagnosis not present

## 2011-05-20 DIAGNOSIS — I10 Essential (primary) hypertension: Secondary | ICD-10-CM | POA: Diagnosis not present

## 2011-06-11 DIAGNOSIS — F200 Paranoid schizophrenia: Secondary | ICD-10-CM | POA: Diagnosis not present

## 2011-06-18 DIAGNOSIS — I10 Essential (primary) hypertension: Secondary | ICD-10-CM | POA: Diagnosis not present

## 2011-07-03 DIAGNOSIS — F200 Paranoid schizophrenia: Secondary | ICD-10-CM | POA: Diagnosis not present

## 2011-08-06 DIAGNOSIS — F4310 Post-traumatic stress disorder, unspecified: Secondary | ICD-10-CM | POA: Diagnosis not present

## 2011-08-06 DIAGNOSIS — F339 Major depressive disorder, recurrent, unspecified: Secondary | ICD-10-CM | POA: Diagnosis not present

## 2011-08-25 DIAGNOSIS — Z111 Encounter for screening for respiratory tuberculosis: Secondary | ICD-10-CM | POA: Diagnosis not present

## 2011-08-31 DIAGNOSIS — F200 Paranoid schizophrenia: Secondary | ICD-10-CM | POA: Diagnosis not present

## 2011-09-04 DIAGNOSIS — Z23 Encounter for immunization: Secondary | ICD-10-CM | POA: Diagnosis not present

## 2011-09-07 DIAGNOSIS — F4310 Post-traumatic stress disorder, unspecified: Secondary | ICD-10-CM | POA: Diagnosis not present

## 2011-09-07 DIAGNOSIS — F339 Major depressive disorder, recurrent, unspecified: Secondary | ICD-10-CM | POA: Diagnosis not present

## 2011-10-02 DIAGNOSIS — F200 Paranoid schizophrenia: Secondary | ICD-10-CM | POA: Diagnosis not present

## 2011-10-14 DIAGNOSIS — I10 Essential (primary) hypertension: Secondary | ICD-10-CM | POA: Diagnosis not present

## 2011-10-14 DIAGNOSIS — Z79899 Other long term (current) drug therapy: Secondary | ICD-10-CM | POA: Diagnosis not present

## 2011-10-14 DIAGNOSIS — D509 Iron deficiency anemia, unspecified: Secondary | ICD-10-CM | POA: Diagnosis not present

## 2011-10-30 DIAGNOSIS — F200 Paranoid schizophrenia: Secondary | ICD-10-CM | POA: Diagnosis not present

## 2011-12-03 DIAGNOSIS — F4310 Post-traumatic stress disorder, unspecified: Secondary | ICD-10-CM | POA: Diagnosis not present

## 2011-12-03 DIAGNOSIS — F200 Paranoid schizophrenia: Secondary | ICD-10-CM | POA: Diagnosis not present

## 2011-12-03 DIAGNOSIS — F339 Major depressive disorder, recurrent, unspecified: Secondary | ICD-10-CM | POA: Diagnosis not present

## 2011-12-30 DIAGNOSIS — F200 Paranoid schizophrenia: Secondary | ICD-10-CM | POA: Diagnosis not present

## 2012-01-14 DIAGNOSIS — E7800 Pure hypercholesterolemia, unspecified: Secondary | ICD-10-CM | POA: Diagnosis not present

## 2012-01-30 DIAGNOSIS — Z79899 Other long term (current) drug therapy: Secondary | ICD-10-CM | POA: Diagnosis not present

## 2012-02-22 DIAGNOSIS — Z79899 Other long term (current) drug therapy: Secondary | ICD-10-CM | POA: Diagnosis not present

## 2012-03-02 DIAGNOSIS — F339 Major depressive disorder, recurrent, unspecified: Secondary | ICD-10-CM | POA: Diagnosis not present

## 2012-03-02 DIAGNOSIS — F4310 Post-traumatic stress disorder, unspecified: Secondary | ICD-10-CM | POA: Diagnosis not present

## 2012-03-02 DIAGNOSIS — F200 Paranoid schizophrenia: Secondary | ICD-10-CM | POA: Diagnosis not present

## 2012-04-01 DIAGNOSIS — F200 Paranoid schizophrenia: Secondary | ICD-10-CM | POA: Diagnosis not present

## 2012-04-14 DIAGNOSIS — F209 Schizophrenia, unspecified: Secondary | ICD-10-CM | POA: Diagnosis not present

## 2012-05-17 DIAGNOSIS — F200 Paranoid schizophrenia: Secondary | ICD-10-CM | POA: Diagnosis not present

## 2012-06-17 DIAGNOSIS — F200 Paranoid schizophrenia: Secondary | ICD-10-CM | POA: Diagnosis not present

## 2012-06-21 DIAGNOSIS — F339 Major depressive disorder, recurrent, unspecified: Secondary | ICD-10-CM | POA: Diagnosis not present

## 2012-06-21 DIAGNOSIS — F4310 Post-traumatic stress disorder, unspecified: Secondary | ICD-10-CM | POA: Diagnosis not present

## 2012-07-18 DIAGNOSIS — F200 Paranoid schizophrenia: Secondary | ICD-10-CM | POA: Diagnosis not present

## 2012-08-16 DIAGNOSIS — F200 Paranoid schizophrenia: Secondary | ICD-10-CM | POA: Diagnosis not present

## 2012-09-16 DIAGNOSIS — F200 Paranoid schizophrenia: Secondary | ICD-10-CM | POA: Diagnosis not present

## 2012-09-20 DIAGNOSIS — F4310 Post-traumatic stress disorder, unspecified: Secondary | ICD-10-CM | POA: Diagnosis not present

## 2012-10-13 DIAGNOSIS — F200 Paranoid schizophrenia: Secondary | ICD-10-CM | POA: Diagnosis not present

## 2012-10-14 DIAGNOSIS — I10 Essential (primary) hypertension: Secondary | ICD-10-CM | POA: Diagnosis not present

## 2012-10-14 DIAGNOSIS — F209 Schizophrenia, unspecified: Secondary | ICD-10-CM | POA: Diagnosis not present

## 2012-10-14 DIAGNOSIS — D509 Iron deficiency anemia, unspecified: Secondary | ICD-10-CM | POA: Diagnosis not present

## 2012-10-14 DIAGNOSIS — E7800 Pure hypercholesterolemia, unspecified: Secondary | ICD-10-CM | POA: Diagnosis not present

## 2012-10-14 DIAGNOSIS — Z79899 Other long term (current) drug therapy: Secondary | ICD-10-CM | POA: Diagnosis not present

## 2012-11-02 DIAGNOSIS — Z23 Encounter for immunization: Secondary | ICD-10-CM | POA: Diagnosis not present

## 2012-11-10 DIAGNOSIS — F200 Paranoid schizophrenia: Secondary | ICD-10-CM | POA: Diagnosis not present

## 2012-11-18 DIAGNOSIS — F200 Paranoid schizophrenia: Secondary | ICD-10-CM | POA: Diagnosis not present

## 2012-12-06 DIAGNOSIS — F200 Paranoid schizophrenia: Secondary | ICD-10-CM | POA: Diagnosis not present

## 2012-12-19 DIAGNOSIS — I10 Essential (primary) hypertension: Secondary | ICD-10-CM | POA: Diagnosis not present

## 2012-12-19 DIAGNOSIS — S93409A Sprain of unspecified ligament of unspecified ankle, initial encounter: Secondary | ICD-10-CM | POA: Diagnosis not present

## 2012-12-19 DIAGNOSIS — E876 Hypokalemia: Secondary | ICD-10-CM | POA: Diagnosis not present

## 2013-01-09 DIAGNOSIS — F200 Paranoid schizophrenia: Secondary | ICD-10-CM | POA: Diagnosis not present

## 2013-01-13 DIAGNOSIS — F200 Paranoid schizophrenia: Secondary | ICD-10-CM | POA: Diagnosis not present

## 2013-01-19 DIAGNOSIS — E876 Hypokalemia: Secondary | ICD-10-CM | POA: Diagnosis not present

## 2013-02-08 DIAGNOSIS — F200 Paranoid schizophrenia: Secondary | ICD-10-CM | POA: Diagnosis not present

## 2013-02-10 DIAGNOSIS — F200 Paranoid schizophrenia: Secondary | ICD-10-CM | POA: Diagnosis not present

## 2013-04-18 DIAGNOSIS — F200 Paranoid schizophrenia: Secondary | ICD-10-CM | POA: Diagnosis not present

## 2013-05-05 DIAGNOSIS — F200 Paranoid schizophrenia: Secondary | ICD-10-CM | POA: Diagnosis not present

## 2013-05-18 DIAGNOSIS — F200 Paranoid schizophrenia: Secondary | ICD-10-CM | POA: Diagnosis not present

## 2013-06-15 DIAGNOSIS — F200 Paranoid schizophrenia: Secondary | ICD-10-CM | POA: Diagnosis not present

## 2013-07-13 DIAGNOSIS — F200 Paranoid schizophrenia: Secondary | ICD-10-CM | POA: Diagnosis not present

## 2013-08-10 DIAGNOSIS — F200 Paranoid schizophrenia: Secondary | ICD-10-CM | POA: Diagnosis not present

## 2013-09-12 DIAGNOSIS — F200 Paranoid schizophrenia: Secondary | ICD-10-CM | POA: Diagnosis not present

## 2013-10-03 DIAGNOSIS — F200 Paranoid schizophrenia: Secondary | ICD-10-CM | POA: Diagnosis not present

## 2013-10-12 DIAGNOSIS — F200 Paranoid schizophrenia: Secondary | ICD-10-CM | POA: Diagnosis not present

## 2013-10-17 DIAGNOSIS — D509 Iron deficiency anemia, unspecified: Secondary | ICD-10-CM | POA: Diagnosis not present

## 2013-10-17 DIAGNOSIS — I10 Essential (primary) hypertension: Secondary | ICD-10-CM | POA: Diagnosis not present

## 2013-10-17 DIAGNOSIS — F209 Schizophrenia, unspecified: Secondary | ICD-10-CM | POA: Diagnosis not present

## 2013-10-17 DIAGNOSIS — E780 Pure hypercholesterolemia: Secondary | ICD-10-CM | POA: Diagnosis not present

## 2013-10-17 DIAGNOSIS — Z23 Encounter for immunization: Secondary | ICD-10-CM | POA: Diagnosis not present

## 2013-11-14 DIAGNOSIS — F200 Paranoid schizophrenia: Secondary | ICD-10-CM | POA: Diagnosis not present

## 2013-12-14 DIAGNOSIS — F200 Paranoid schizophrenia: Secondary | ICD-10-CM | POA: Diagnosis not present

## 2013-12-26 DIAGNOSIS — F200 Paranoid schizophrenia: Secondary | ICD-10-CM | POA: Diagnosis not present

## 2014-01-15 DIAGNOSIS — F200 Paranoid schizophrenia: Secondary | ICD-10-CM | POA: Diagnosis not present

## 2014-01-19 DIAGNOSIS — Z79899 Other long term (current) drug therapy: Secondary | ICD-10-CM | POA: Diagnosis not present

## 2014-01-19 DIAGNOSIS — E780 Pure hypercholesterolemia: Secondary | ICD-10-CM | POA: Diagnosis not present

## 2014-02-12 DIAGNOSIS — F200 Paranoid schizophrenia: Secondary | ICD-10-CM | POA: Diagnosis not present

## 2014-03-05 DIAGNOSIS — E780 Pure hypercholesterolemia: Secondary | ICD-10-CM | POA: Diagnosis not present

## 2014-03-05 DIAGNOSIS — Z79899 Other long term (current) drug therapy: Secondary | ICD-10-CM | POA: Diagnosis not present

## 2014-03-15 DIAGNOSIS — F200 Paranoid schizophrenia: Secondary | ICD-10-CM | POA: Diagnosis not present

## 2014-03-19 DIAGNOSIS — F200 Paranoid schizophrenia: Secondary | ICD-10-CM | POA: Diagnosis not present

## 2014-04-16 DIAGNOSIS — F200 Paranoid schizophrenia: Secondary | ICD-10-CM | POA: Diagnosis not present

## 2014-05-14 DIAGNOSIS — F200 Paranoid schizophrenia: Secondary | ICD-10-CM | POA: Diagnosis not present

## 2014-06-07 DIAGNOSIS — F200 Paranoid schizophrenia: Secondary | ICD-10-CM | POA: Diagnosis not present

## 2014-06-18 DIAGNOSIS — F200 Paranoid schizophrenia: Secondary | ICD-10-CM | POA: Diagnosis not present

## 2014-07-18 DIAGNOSIS — F200 Paranoid schizophrenia: Secondary | ICD-10-CM | POA: Diagnosis not present

## 2014-08-15 DIAGNOSIS — F200 Paranoid schizophrenia: Secondary | ICD-10-CM | POA: Diagnosis not present

## 2014-08-30 DIAGNOSIS — F200 Paranoid schizophrenia: Secondary | ICD-10-CM | POA: Diagnosis not present

## 2014-09-18 DIAGNOSIS — F200 Paranoid schizophrenia: Secondary | ICD-10-CM | POA: Diagnosis not present

## 2014-10-18 DIAGNOSIS — I10 Essential (primary) hypertension: Secondary | ICD-10-CM | POA: Diagnosis not present

## 2014-10-18 DIAGNOSIS — E7800 Pure hypercholesterolemia, unspecified: Secondary | ICD-10-CM | POA: Diagnosis not present

## 2014-10-18 DIAGNOSIS — F209 Schizophrenia, unspecified: Secondary | ICD-10-CM | POA: Diagnosis not present

## 2014-10-18 DIAGNOSIS — D509 Iron deficiency anemia, unspecified: Secondary | ICD-10-CM | POA: Diagnosis not present

## 2014-10-18 DIAGNOSIS — Z23 Encounter for immunization: Secondary | ICD-10-CM | POA: Diagnosis not present

## 2014-11-13 DIAGNOSIS — F200 Paranoid schizophrenia: Secondary | ICD-10-CM | POA: Diagnosis not present

## 2014-11-27 DIAGNOSIS — F419 Anxiety disorder, unspecified: Secondary | ICD-10-CM | POA: Diagnosis not present

## 2014-11-27 DIAGNOSIS — F200 Paranoid schizophrenia: Secondary | ICD-10-CM | POA: Diagnosis not present

## 2014-12-17 DIAGNOSIS — F200 Paranoid schizophrenia: Secondary | ICD-10-CM | POA: Diagnosis not present

## 2015-01-16 DIAGNOSIS — F200 Paranoid schizophrenia: Secondary | ICD-10-CM | POA: Diagnosis not present

## 2015-02-15 DIAGNOSIS — F200 Paranoid schizophrenia: Secondary | ICD-10-CM | POA: Diagnosis not present

## 2015-02-25 DIAGNOSIS — F419 Anxiety disorder, unspecified: Secondary | ICD-10-CM | POA: Diagnosis not present

## 2015-02-25 DIAGNOSIS — F200 Paranoid schizophrenia: Secondary | ICD-10-CM | POA: Diagnosis not present

## 2015-03-14 DIAGNOSIS — F200 Paranoid schizophrenia: Secondary | ICD-10-CM | POA: Diagnosis not present

## 2015-04-16 DIAGNOSIS — F200 Paranoid schizophrenia: Secondary | ICD-10-CM | POA: Diagnosis not present

## 2015-05-16 DIAGNOSIS — F200 Paranoid schizophrenia: Secondary | ICD-10-CM | POA: Diagnosis not present

## 2015-05-20 DIAGNOSIS — F419 Anxiety disorder, unspecified: Secondary | ICD-10-CM | POA: Diagnosis not present

## 2015-05-20 DIAGNOSIS — F200 Paranoid schizophrenia: Secondary | ICD-10-CM | POA: Diagnosis not present

## 2015-06-20 DIAGNOSIS — F200 Paranoid schizophrenia: Secondary | ICD-10-CM | POA: Diagnosis not present

## 2015-07-15 DIAGNOSIS — F200 Paranoid schizophrenia: Secondary | ICD-10-CM | POA: Diagnosis not present

## 2015-08-12 DIAGNOSIS — F419 Anxiety disorder, unspecified: Secondary | ICD-10-CM | POA: Diagnosis not present

## 2015-08-12 DIAGNOSIS — F200 Paranoid schizophrenia: Secondary | ICD-10-CM | POA: Diagnosis not present

## 2015-08-13 DIAGNOSIS — F200 Paranoid schizophrenia: Secondary | ICD-10-CM | POA: Diagnosis not present

## 2015-09-20 DIAGNOSIS — F200 Paranoid schizophrenia: Secondary | ICD-10-CM | POA: Diagnosis not present

## 2015-10-18 ENCOUNTER — Other Ambulatory Visit: Payer: Self-pay | Admitting: Family Medicine

## 2015-10-18 ENCOUNTER — Other Ambulatory Visit (HOSPITAL_COMMUNITY)
Admission: RE | Admit: 2015-10-18 | Discharge: 2015-10-18 | Disposition: A | Discharge status: Home / Self Care | Payer: Medicare Other | Source: Ambulatory Visit | Attending: Family Medicine | Admitting: Family Medicine

## 2015-10-18 DIAGNOSIS — Z23 Encounter for immunization: Secondary | ICD-10-CM | POA: Diagnosis not present

## 2015-10-18 DIAGNOSIS — F209 Schizophrenia, unspecified: Secondary | ICD-10-CM | POA: Diagnosis not present

## 2015-10-18 DIAGNOSIS — Z124 Encounter for screening for malignant neoplasm of cervix: Secondary | ICD-10-CM | POA: Diagnosis present

## 2015-10-18 DIAGNOSIS — I10 Essential (primary) hypertension: Secondary | ICD-10-CM | POA: Diagnosis not present

## 2015-10-18 DIAGNOSIS — E7800 Pure hypercholesterolemia, unspecified: Secondary | ICD-10-CM | POA: Diagnosis not present

## 2015-10-18 DIAGNOSIS — D509 Iron deficiency anemia, unspecified: Secondary | ICD-10-CM | POA: Diagnosis not present

## 2015-10-21 LAB — CYTOLOGY - PAP

## 2015-11-04 DIAGNOSIS — F200 Paranoid schizophrenia: Secondary | ICD-10-CM | POA: Diagnosis not present

## 2015-11-04 DIAGNOSIS — F419 Anxiety disorder, unspecified: Secondary | ICD-10-CM | POA: Diagnosis not present

## 2015-12-02 DIAGNOSIS — E876 Hypokalemia: Secondary | ICD-10-CM | POA: Diagnosis not present

## 2015-12-02 DIAGNOSIS — R7301 Impaired fasting glucose: Secondary | ICD-10-CM | POA: Diagnosis not present

## 2015-12-12 DIAGNOSIS — J209 Acute bronchitis, unspecified: Secondary | ICD-10-CM | POA: Diagnosis not present

## 2015-12-12 DIAGNOSIS — R05 Cough: Secondary | ICD-10-CM | POA: Diagnosis not present

## 2015-12-31 DIAGNOSIS — J209 Acute bronchitis, unspecified: Secondary | ICD-10-CM | POA: Diagnosis not present

## 2016-01-07 DIAGNOSIS — F200 Paranoid schizophrenia: Secondary | ICD-10-CM | POA: Diagnosis not present

## 2016-01-27 DIAGNOSIS — F200 Paranoid schizophrenia: Secondary | ICD-10-CM | POA: Diagnosis not present

## 2016-01-27 DIAGNOSIS — F419 Anxiety disorder, unspecified: Secondary | ICD-10-CM | POA: Diagnosis not present

## 2016-02-27 DIAGNOSIS — F200 Paranoid schizophrenia: Secondary | ICD-10-CM | POA: Diagnosis not present

## 2016-03-20 DIAGNOSIS — F200 Paranoid schizophrenia: Secondary | ICD-10-CM | POA: Diagnosis not present

## 2016-04-16 DIAGNOSIS — F200 Paranoid schizophrenia: Secondary | ICD-10-CM | POA: Diagnosis not present

## 2016-04-20 DIAGNOSIS — F419 Anxiety disorder, unspecified: Secondary | ICD-10-CM | POA: Diagnosis not present

## 2016-04-20 DIAGNOSIS — F200 Paranoid schizophrenia: Secondary | ICD-10-CM | POA: Diagnosis not present

## 2016-05-18 DIAGNOSIS — F200 Paranoid schizophrenia: Secondary | ICD-10-CM | POA: Diagnosis not present

## 2016-06-09 DIAGNOSIS — R7303 Prediabetes: Secondary | ICD-10-CM | POA: Diagnosis not present

## 2016-06-16 DIAGNOSIS — F200 Paranoid schizophrenia: Secondary | ICD-10-CM | POA: Diagnosis not present

## 2016-07-06 DIAGNOSIS — F419 Anxiety disorder, unspecified: Secondary | ICD-10-CM | POA: Diagnosis not present

## 2016-07-06 DIAGNOSIS — F200 Paranoid schizophrenia: Secondary | ICD-10-CM | POA: Diagnosis not present

## 2016-07-23 DIAGNOSIS — F200 Paranoid schizophrenia: Secondary | ICD-10-CM | POA: Diagnosis not present

## 2016-08-17 DIAGNOSIS — F200 Paranoid schizophrenia: Secondary | ICD-10-CM | POA: Diagnosis not present

## 2016-09-17 DIAGNOSIS — F200 Paranoid schizophrenia: Secondary | ICD-10-CM | POA: Diagnosis not present

## 2016-09-28 DIAGNOSIS — F419 Anxiety disorder, unspecified: Secondary | ICD-10-CM | POA: Diagnosis not present

## 2016-09-28 DIAGNOSIS — F200 Paranoid schizophrenia: Secondary | ICD-10-CM | POA: Diagnosis not present

## 2016-10-19 DIAGNOSIS — E7800 Pure hypercholesterolemia, unspecified: Secondary | ICD-10-CM | POA: Diagnosis not present

## 2016-10-19 DIAGNOSIS — F209 Schizophrenia, unspecified: Secondary | ICD-10-CM | POA: Diagnosis not present

## 2016-10-19 DIAGNOSIS — R7303 Prediabetes: Secondary | ICD-10-CM | POA: Diagnosis not present

## 2016-10-19 DIAGNOSIS — E669 Obesity, unspecified: Secondary | ICD-10-CM | POA: Diagnosis not present

## 2016-10-19 DIAGNOSIS — I10 Essential (primary) hypertension: Secondary | ICD-10-CM | POA: Diagnosis not present

## 2016-10-19 DIAGNOSIS — D509 Iron deficiency anemia, unspecified: Secondary | ICD-10-CM | POA: Diagnosis not present

## 2016-10-19 DIAGNOSIS — Z23 Encounter for immunization: Secondary | ICD-10-CM | POA: Diagnosis not present

## 2016-11-18 DIAGNOSIS — F200 Paranoid schizophrenia: Secondary | ICD-10-CM | POA: Diagnosis not present

## 2016-12-18 DIAGNOSIS — F200 Paranoid schizophrenia: Secondary | ICD-10-CM | POA: Diagnosis not present

## 2016-12-28 DIAGNOSIS — F200 Paranoid schizophrenia: Secondary | ICD-10-CM | POA: Diagnosis not present

## 2016-12-28 DIAGNOSIS — F419 Anxiety disorder, unspecified: Secondary | ICD-10-CM | POA: Diagnosis not present

## 2017-01-19 DIAGNOSIS — F200 Paranoid schizophrenia: Secondary | ICD-10-CM | POA: Diagnosis not present

## 2017-02-22 DIAGNOSIS — F200 Paranoid schizophrenia: Secondary | ICD-10-CM | POA: Diagnosis not present

## 2017-03-18 ENCOUNTER — Other Ambulatory Visit: Payer: Self-pay

## 2017-03-18 DIAGNOSIS — F200 Paranoid schizophrenia: Secondary | ICD-10-CM | POA: Diagnosis not present

## 2017-03-18 LAB — CBC WITH DIFFERENTIAL/PLATELET
BASOS ABS: 0 10*3/uL (ref 0.0–0.2)
Basos: 0 %
EOS (ABSOLUTE): 0 10*3/uL (ref 0.0–0.4)
EOS: 0 %
HEMOGLOBIN: 10.7 g/dL — AB (ref 11.1–15.9)
Hematocrit: 32.8 % — ABNORMAL LOW (ref 34.0–46.6)
IMMATURE GRANULOCYTES: 0 %
Immature Grans (Abs): 0 10*3/uL (ref 0.0–0.1)
Lymphocytes Absolute: 1.9 10*3/uL (ref 0.7–3.1)
Lymphs: 19 %
MCH: 27.6 pg (ref 26.6–33.0)
MCHC: 32.6 g/dL (ref 31.5–35.7)
MCV: 85 fL (ref 79–97)
MONOCYTES: 13 %
MONOS ABS: 1.3 10*3/uL — AB (ref 0.1–0.9)
NEUTROS PCT: 68 %
Neutrophils Absolute: 6.6 10*3/uL (ref 1.4–7.0)
Platelets: 388 10*3/uL — ABNORMAL HIGH (ref 150–379)
RBC: 3.88 x10E6/uL (ref 3.77–5.28)
RDW: 15.7 % — AB (ref 12.3–15.4)
WBC: 9.8 10*3/uL (ref 3.4–10.8)

## 2017-03-25 DIAGNOSIS — F200 Paranoid schizophrenia: Secondary | ICD-10-CM | POA: Diagnosis not present

## 2017-03-25 DIAGNOSIS — F419 Anxiety disorder, unspecified: Secondary | ICD-10-CM | POA: Diagnosis not present

## 2017-04-21 DIAGNOSIS — F200 Paranoid schizophrenia: Secondary | ICD-10-CM | POA: Diagnosis not present

## 2017-06-15 DIAGNOSIS — F200 Paranoid schizophrenia: Secondary | ICD-10-CM | POA: Diagnosis not present

## 2017-06-17 DIAGNOSIS — F200 Paranoid schizophrenia: Secondary | ICD-10-CM | POA: Diagnosis not present

## 2017-06-17 DIAGNOSIS — F419 Anxiety disorder, unspecified: Secondary | ICD-10-CM | POA: Diagnosis not present

## 2017-07-19 DIAGNOSIS — F200 Paranoid schizophrenia: Secondary | ICD-10-CM | POA: Diagnosis not present

## 2017-08-05 ENCOUNTER — Telehealth: Payer: Self-pay

## 2017-08-05 NOTE — Telephone Encounter (Signed)
Opened in error

## 2017-08-18 DIAGNOSIS — F200 Paranoid schizophrenia: Secondary | ICD-10-CM | POA: Diagnosis not present

## 2017-09-14 DIAGNOSIS — F419 Anxiety disorder, unspecified: Secondary | ICD-10-CM | POA: Diagnosis not present

## 2017-09-14 DIAGNOSIS — F200 Paranoid schizophrenia: Secondary | ICD-10-CM | POA: Diagnosis not present

## 2017-09-20 DIAGNOSIS — F200 Paranoid schizophrenia: Secondary | ICD-10-CM | POA: Diagnosis not present

## 2017-10-19 DIAGNOSIS — I10 Essential (primary) hypertension: Secondary | ICD-10-CM | POA: Diagnosis not present

## 2017-10-19 DIAGNOSIS — E7800 Pure hypercholesterolemia, unspecified: Secondary | ICD-10-CM | POA: Diagnosis not present

## 2017-10-19 DIAGNOSIS — D509 Iron deficiency anemia, unspecified: Secondary | ICD-10-CM | POA: Diagnosis not present

## 2017-10-19 DIAGNOSIS — F209 Schizophrenia, unspecified: Secondary | ICD-10-CM | POA: Diagnosis not present

## 2017-10-19 DIAGNOSIS — Z23 Encounter for immunization: Secondary | ICD-10-CM | POA: Diagnosis not present

## 2017-10-19 DIAGNOSIS — R7303 Prediabetes: Secondary | ICD-10-CM | POA: Diagnosis not present

## 2017-11-18 DIAGNOSIS — F200 Paranoid schizophrenia: Secondary | ICD-10-CM | POA: Diagnosis not present

## 2017-12-14 DIAGNOSIS — F200 Paranoid schizophrenia: Secondary | ICD-10-CM | POA: Diagnosis not present

## 2017-12-14 DIAGNOSIS — F419 Anxiety disorder, unspecified: Secondary | ICD-10-CM | POA: Diagnosis not present

## 2018-01-17 DIAGNOSIS — F200 Paranoid schizophrenia: Secondary | ICD-10-CM | POA: Diagnosis not present

## 2020-07-10 DIAGNOSIS — M25561 Pain in right knee: Secondary | ICD-10-CM | POA: Diagnosis not present

## 2020-07-29 ENCOUNTER — Ambulatory Visit: Payer: Self-pay

## 2020-07-29 ENCOUNTER — Ambulatory Visit (INDEPENDENT_AMBULATORY_CARE_PROVIDER_SITE_OTHER): Payer: Medicare Other | Admitting: Orthopaedic Surgery

## 2020-07-29 VITALS — Ht 66.5 in | Wt 198.2 lb

## 2020-07-29 DIAGNOSIS — M25561 Pain in right knee: Secondary | ICD-10-CM

## 2020-07-29 MED ORDER — MELOXICAM 15 MG PO TABS: 15.0000 mg | ORAL_TABLET | Freq: Every day | ORAL | 3 refills | Status: DC | PRN

## 2020-07-29 MED ORDER — LIDOCAINE HCL 1 % IJ SOLN
3.0000 mL | INTRAMUSCULAR | Status: AC | PRN
  Administered 2020-07-29: 3 mL

## 2020-07-29 MED ORDER — METHYLPREDNISOLONE ACETATE 40 MG/ML IJ SUSP
40.0000 mg | INTRAMUSCULAR | Status: AC | PRN
  Administered 2020-07-29: 40 mg via INTRA_ARTICULAR

## 2020-07-29 NOTE — Progress Notes (Signed)
Office Visit Note   Patient: Debra Klein           Date of Birth: 1975-02-07           MRN: [id number] Visit Date: 07/29/2020              Requested by: No referring provider defined for this encounter. PCP: No primary care provider on file.   Assessment & Plan: Visit Diagnoses:  1. Acute pain of right knee     Plan: Based on her x-rays I recommended a steroid injection in the right knee.  This is also based on her clinical exam.  She agreed that this treatment measure and tolerated the steroid injection well.  I will start her on meloxicam as an occasional anti-inflammatory to try as needed.  All questions and concerns were answered and addressed.  Follow-up can be as needed.  If things worsen they will let us know.  Follow-Up Instructions: No follow-ups on file.   Orders:  Orders Placed This Encounter  Procedures   Large Joint Inj   XR Knee 1-2 Views Right   Meds ordered this encounter  Medications   meloxicam (MOBIC) 15 MG tablet    Sig: Take 1 tablet (15 mg total) by mouth daily as needed for pain.    Dispense:  30 tablet    Refill:  3      Procedures: Large Joint Inj: L knee on 07/29/2020 3:14 PM Indications: diagnostic evaluation and pain Details: 22 G 1.5 in needle, superolateral approach  Arthrogram: No  Medications: 3 mL lidocaine 1 %; 40 mg methylPREDNISolone acetate 40 MG/ML Outcome: tolerated well, no immediate complications Procedure, treatment alternatives, risks and benefits explained, specific risks discussed. Consent was given by the patient. Immediately prior to procedure a time out was called to verify the correct patient, procedure, equipment, support staff and site/side marked as required. Patient was prepped and draped in the usual sterile fashion.      Clinical Data: No additional findings.   Subjective: Chief Complaint  Patient presents with   Right Knee - Pain  The patient comes in with a 2-week history of right knee pain.  She  feels like a strain on her knee with no known injury.  She does not feel like it is going to give out but is painful when she walks and tries to sleep.  She has been taking some ibuprofen.  The pain is along the medial aspect of her knee.  She denies any left knee pain at all.  Again this is been going on for only 2 weeks.  She is not a diabetic.  She has never had surgery on her right knee either.  HPI  Review of Systems She currently denies any headache, chest pain, shortness of breath, fever, chills, nausea, vomiting  Objective: Vital Signs: Ht 5' 6.5" (1.689 m)   Wt 198 lb 3.2 oz (89.9 kg)   BMI 31.51 kg/m   Physical Exam She is alert and oriented x3 and in no acute distress Ortho Exam Examination of her right knee shows only medial joint line tenderness with no effusion.  The knee is ligamentously stable with full range of motion. Specialty Comments:  No specialty comments available.  Imaging: XR Knee 1-2 Views Right  Result Date: 07/29/2020 The right knee showed no acute findings.  There is moderate arthritic changes with medial joint space narrowing and patellofemoral narrowing as well as patellofemoral osteophytes.  The AP view that does show the right  knee shows some lesions in the proximal tibia on the left knee that are sclerotic and well-corticated suggesting a benign process.  The patient is completely asymptomatic with her left knee.    PMFS History: There are no problems to display for this patient.  No past medical history on file.  No family history on file.   Social History   Occupational History   Not on file  Tobacco Use   Smoking status: Not on file   Smokeless tobacco: Not on file  Substance and Sexual Activity   Alcohol use: Not on file   Drug use: Not on file   Sexual activity: Not on file

## 2020-11-15 DIAGNOSIS — Z23 Encounter for immunization: Secondary | ICD-10-CM | POA: Diagnosis not present

## 2020-11-15 DIAGNOSIS — R634 Abnormal weight loss: Secondary | ICD-10-CM | POA: Diagnosis not present

## 2020-11-15 DIAGNOSIS — Z0000 Encounter for general adult medical examination without abnormal findings: Secondary | ICD-10-CM | POA: Diagnosis not present

## 2020-11-15 DIAGNOSIS — Z1389 Encounter for screening for other disorder: Secondary | ICD-10-CM | POA: Diagnosis not present

## 2020-11-15 DIAGNOSIS — R7303 Prediabetes: Secondary | ICD-10-CM | POA: Diagnosis not present

## 2020-11-15 DIAGNOSIS — D509 Iron deficiency anemia, unspecified: Secondary | ICD-10-CM | POA: Diagnosis not present

## 2020-11-15 DIAGNOSIS — E7800 Pure hypercholesterolemia, unspecified: Secondary | ICD-10-CM | POA: Diagnosis not present

## 2020-11-15 DIAGNOSIS — I10 Essential (primary) hypertension: Secondary | ICD-10-CM | POA: Diagnosis not present

## 2020-11-29 DIAGNOSIS — E876 Hypokalemia: Secondary | ICD-10-CM | POA: Diagnosis not present

## 2020-12-04 ENCOUNTER — Ambulatory Visit (INDEPENDENT_AMBULATORY_CARE_PROVIDER_SITE_OTHER): Payer: Medicare Other | Admitting: Orthopaedic Surgery

## 2020-12-04 ENCOUNTER — Other Ambulatory Visit: Payer: Self-pay

## 2020-12-04 ENCOUNTER — Encounter: Payer: Self-pay | Admitting: Orthopaedic Surgery

## 2020-12-04 DIAGNOSIS — G8929 Other chronic pain: Secondary | ICD-10-CM | POA: Diagnosis not present

## 2020-12-04 DIAGNOSIS — M25461 Effusion, right knee: Secondary | ICD-10-CM

## 2020-12-04 DIAGNOSIS — M25561 Pain in right knee: Secondary | ICD-10-CM | POA: Diagnosis not present

## 2020-12-04 MED ORDER — METHYLPREDNISOLONE ACETATE 40 MG/ML IJ SUSP
40.0000 mg | INTRAMUSCULAR | Status: AC | PRN
  Administered 2020-12-04: 40 mg via INTRA_ARTICULAR

## 2020-12-04 MED ORDER — LIDOCAINE HCL 1 % IJ SOLN
3.0000 mL | INTRAMUSCULAR | Status: AC | PRN
  Administered 2020-12-04: 3 mL

## 2020-12-04 NOTE — Progress Notes (Signed)
Office Visit Note   Patient: Debra Klein           Date of Birth: [birth year]           MRN: [id number] Visit Date: 12/04/2020              Requested by: No referring provider defined for this encounter. PCP: No primary care provider on file.   Assessment & Plan: Visit Diagnoses:  1. Chronic pain of right knee   2. Effusion, right knee     Plan: At this point, we have failed conservative treatment for her knee for well over 6 weeks.  She has now been hurting for over 6 months now.  The steroid injection in July only temporize her symptoms.  I did place a steroid injection in her knee today to help her through the holidays while we recommend a MRI of her right knee to rule out a meniscal tear.  We will see her back in follow-up once we have this MRI.  Follow-Up Instructions: No follow-ups on file.   Orders:  Orders Placed This Encounter  Procedures   Large Joint Inj   No orders of the defined types were placed in this encounter.     Procedures: Large Joint Inj: R knee on 12/04/2020 4:07 PM Indications: diagnostic evaluation and pain Details: 22 G 1.5 in needle, superolateral approach  Arthrogram: No  Medications: 3 mL lidocaine 1 %; 40 mg methylPREDNISolone acetate 40 MG/ML Outcome: tolerated well, no immediate complications Procedure, treatment alternatives, risks and benefits explained, specific risks discussed. Consent was given by the patient. Immediately prior to procedure a time out was called to verify the correct patient, procedure, equipment, support staff and site/side marked as required. Patient was prepped and draped in the usual sterile fashion.      Clinical Data: No additional findings.   Subjective: Chief Complaint  Patient presents with   Right Knee - Pain, Follow-up  The patient is sometimes seen before.  She is only 45 years old and has been dealing with chronic right knee issues.  When I saw her in July we x-rayed the knee and did show some  slight medial joint space narrowing.  She is not obese.  She was having locking and catching and occasional stabbing pains.  We did place a steroid injection in her right knee and it did help somewhat.  She is on meloxicam.  The pain comes and goes but is really difficult with going up and down stairs and weightbearing and pivoting activities causes sharp pain in the right knee.  She is worked on activity modification and quad strengthening as well.  She is otherwise had no acute change in her medical status.  She denies any specific injury and has not had surgery on her right knee.  HPI  Review of Systems There is currently listed no fever, chills, nausea, vomiting  Objective: Vital Signs: There were no vitals taken for this visit.  Physical Exam She is alert and oriented x3 and in no acute distress Ortho Exam Examination of her right knee shows a mild effusion.  There is medial joint line tenderness and a positive McMurray's exam to the medial compartment of the knee.  The patella seems to track well and she has good flexion extension of her right knee. Specialty Comments:  No specialty comments available.  Imaging: No results found.   PMFS History: There are no problems to display for this patient.  History reviewed. No pertinent past  medical history.  History reviewed. No pertinent family history.  History reviewed. No pertinent surgical history. Social History   Occupational History   Not on file  Tobacco Use   Smoking status: Not on file   Smokeless tobacco: Not on file  Substance and Sexual Activity   Alcohol use: Not on file   Drug use: Not on file   Sexual activity: Not on file

## 2020-12-09 ENCOUNTER — Other Ambulatory Visit: Payer: Self-pay

## 2020-12-09 DIAGNOSIS — G8929 Other chronic pain: Secondary | ICD-10-CM

## 2020-12-09 DIAGNOSIS — M25561 Pain in right knee: Secondary | ICD-10-CM

## 2021-01-01 ENCOUNTER — Ambulatory Visit
Admission: RE | Admit: 2021-01-01 | Discharge: 2021-01-01 | Disposition: A | Discharge status: Home / Self Care | Payer: Medicare Other | Source: Ambulatory Visit | Attending: Orthopaedic Surgery | Admitting: Orthopaedic Surgery

## 2021-01-01 ENCOUNTER — Other Ambulatory Visit: Payer: Self-pay

## 2021-01-01 DIAGNOSIS — M25561 Pain in right knee: Secondary | ICD-10-CM | POA: Diagnosis not present

## 2021-01-01 DIAGNOSIS — M1712 Unilateral primary osteoarthritis, left knee: Secondary | ICD-10-CM | POA: Diagnosis not present

## 2021-01-01 DIAGNOSIS — M25562 Pain in left knee: Secondary | ICD-10-CM

## 2021-01-09 ENCOUNTER — Ambulatory Visit (INDEPENDENT_AMBULATORY_CARE_PROVIDER_SITE_OTHER): Payer: Medicare Other | Admitting: Physician Assistant

## 2021-01-09 ENCOUNTER — Telehealth: Payer: Self-pay

## 2021-01-09 ENCOUNTER — Encounter: Payer: Self-pay | Admitting: Physician Assistant

## 2021-01-09 DIAGNOSIS — M1711 Unilateral primary osteoarthritis, right knee: Secondary | ICD-10-CM | POA: Diagnosis not present

## 2021-01-09 DIAGNOSIS — M1712 Unilateral primary osteoarthritis, left knee: Secondary | ICD-10-CM | POA: Diagnosis not present

## 2021-01-09 NOTE — Progress Notes (Signed)
HPI: Mrs. Melucci returns to go over the MRI of both knees.  She states she has had no new falls or injuries.  She said no change in pain in either knee.  She ranks her right knee pain to be 6 out of 10 at worst in her left to be 2 out of 10 pain at worst.  She is having no mechanical symptoms of either knee.  She presents today with her mother.  She has tried a steroid injection right knee and this helped some. MRI right knee shows moderate to severe tricompartmental arthritis involving the medial compartment and areas of full-thickness patellofemoral chondromalacia.  Lateral compartment with mild chondral thinning without a focal defect.  Medial meniscus with complex tear/maceration involving the body and anterior horn. Left knee MRI showed mild to moderate tricompartmental arthritis.  Intraosseous cystic changes likely reflecting an interosseous ganglion cyst involving the proximal tibia.  No discrete meniscal tear.  Lateral meniscal anterior horn with intersubstance degeneration. MRI images are reviewed with patient and her mother is present throughout the exam today.  Physical exam: General well-developed well-nourished female no acute distress.  Ambulates without any assistive device. Bilateral knees good range of motion.  Impression: Bilateral knee tricompartmental arthritis  Plan: Recommend supplemental injections both knees.  Discussed knee friendly exercises with the patient.  Discussed the importance of keeping her weight down in regards to her knee pain.  We will have her follow-up with [place] once the supplemental injections are available.  Handout on supplemental injections was given.  Questions were encouraged and answered.

## 2021-01-09 NOTE — Telephone Encounter (Signed)
Please get auth for bilateral knee gel injection-gil pt 

## 2021-01-10 NOTE — Telephone Encounter (Signed)
Noted  

## 2021-02-27 ENCOUNTER — Other Ambulatory Visit: Payer: Self-pay | Admitting: Orthopaedic Surgery

## 2021-05-30 ENCOUNTER — Other Ambulatory Visit: Payer: Self-pay | Admitting: Orthopaedic Surgery

## 2021-06-03 ENCOUNTER — Encounter: Payer: Self-pay | Admitting: Orthopaedic Surgery

## 2021-06-03 ENCOUNTER — Ambulatory Visit (INDEPENDENT_AMBULATORY_CARE_PROVIDER_SITE_OTHER): Payer: Medicare Other | Admitting: Orthopaedic Surgery

## 2021-06-03 DIAGNOSIS — M1712 Unilateral primary osteoarthritis, left knee: Secondary | ICD-10-CM | POA: Diagnosis not present

## 2021-06-03 MED ORDER — LIDOCAINE HCL 1 % IJ SOLN
3.0000 mL | INTRAMUSCULAR | Status: AC | PRN
  Administered 2021-06-03: 3 mL

## 2021-06-03 MED ORDER — METHYLPREDNISOLONE ACETATE 40 MG/ML IJ SUSP
40.0000 mg | INTRAMUSCULAR | Status: AC | PRN
  Administered 2021-06-03: 40 mg via INTRA_ARTICULAR

## 2021-06-03 NOTE — Progress Notes (Signed)
   Procedure Note  Patient: Debra Klein             Date of Birth: 1975/05/04           MRN: [id number]             Visit Date: 06/03/2021 HPI: Debra Klein returns today with her mother requesting cortisone injection left knee.  She has had no new injury.  Last injection in the knee was given in December.  She has mild to moderate tricompartmental arthritis in the knee.  She denies any fevers chills.  No injury.  Injection lasted until just recently.  She is taking ibuprofen states had to stop the Mobic due to the fact it was making her sleepy.  Physical exam: Left knee no abnormal warmth erythema or effusion.  Lacks full extension by few degrees and flexes beyond 90 degrees.  Procedures: Visit Diagnoses:  1. Primary osteoarthritis of left knee     Large Joint Inj: L knee on 06/03/2021 11:23 AM Indications: pain Details: 22 G 1.5 in needle, anterolateral approach  Arthrogram: No  Medications: 3 mL lidocaine 1 %; 40 mg methylPREDNISolone acetate 40 MG/ML Outcome: tolerated well, no immediate complications Procedure, treatment alternatives, risks and benefits explained, specific risks discussed. Consent was given by the patient. Immediately prior to procedure a time out was called to verify the correct patient, procedure, equipment, support staff and site/side marked as required. Patient was prepped and draped in the usual sterile fashion.    Plan: She understands to wait at least 3 months between injections.  Her mother did ask about supplemental injections due to the fact she has been here 6 months out of the cortisone injections would not recommend supplemental injections at this time.  Questions were encouraged and answered.  Patient and mother is present throughout the exam.

## 2021-06-10 ENCOUNTER — Telehealth: Payer: Self-pay | Admitting: Orthopaedic Surgery

## 2021-06-10 NOTE — Telephone Encounter (Signed)
Pt's mother Debra Klein called stating that pt has been limping since cortisone injection on left leg. Please call [name] back with medical advice at [phone].

## 2021-06-10 NOTE — Telephone Encounter (Signed)
Mom aware of below message

## 2021-06-12 DIAGNOSIS — M179 Osteoarthritis of knee, unspecified: Secondary | ICD-10-CM | POA: Diagnosis not present

## 2021-06-17 ENCOUNTER — Telehealth: Payer: Self-pay | Admitting: Orthopaedic Surgery

## 2021-06-17 NOTE — Telephone Encounter (Signed)
Patient's mother Debra Klein called advised patient fell 06/15/2021 and the left ankle is swollen. [name] said patient is unable to walk and complaining about she's in a lot of pain.  The number to [name] is [phone] or [phone]

## 2021-06-18 ENCOUNTER — Ambulatory Visit (INDEPENDENT_AMBULATORY_CARE_PROVIDER_SITE_OTHER): Payer: Medicare Other

## 2021-06-18 ENCOUNTER — Ambulatory Visit (INDEPENDENT_AMBULATORY_CARE_PROVIDER_SITE_OTHER): Payer: Medicare Other | Admitting: Physician Assistant

## 2021-06-18 ENCOUNTER — Encounter: Payer: Self-pay | Admitting: Physician Assistant

## 2021-06-18 DIAGNOSIS — M25572 Pain in left ankle and joints of left foot: Secondary | ICD-10-CM | POA: Diagnosis not present

## 2021-06-18 NOTE — Progress Notes (Signed)
wwww

## 2021-06-18 NOTE — Progress Notes (Signed)
Office Visit Note   Patient: Debra Klein           Date of Birth: 04/13/1975           MRN: [id number] Visit Date: 06/18/2021              Requested by: No referring provider defined for this encounter. PCP: Pcp, No  Chief Complaint  Patient presents with   Left Ankle - Pain      HPI: Patient is a 46 year old woman who is minimally verbal.  She has a significant psychiatric history and is cared for by her family.  Her mother is with her today.  Her mother said she has had a number of falls and fell down some stairs this morning.  She has a previous history many years ago when she was in college of a tib-fib fracture.  Mother has her using a walker today.  Normally she ambulates with her hand on her mother shoulder.  Mom thinks she is mostly complaining of lateral leg pain.  Assessment & Plan: Visit Diagnoses:  1. Pain in left ankle and joints of left foot     Plan: 46 year old woman status post fall.  She does not really have any swelling in her ankle and her compartments of her leg are soft.  She does verbalize just a little bit that she may hurt over the lateral compartment of her leg.  Cannot appreciate any ecchymosis or bruising.  Looking at her x-rays in several projections has 1 view of the ankle that may demonstrate refracture of the previous fibula fracture but also may many views show good healing of this.  Given her symptoms either way I think the treatment would best be putting in her tall cam walker boot.  She has an appointment upcoming with Dr. Ninfa Linden will keep this.  They are going on vacation after that appointment.  Follow-Up Instructions: Return in about 1 week (around 06/25/2021).   Ortho Exam  Patient is alert, oriented, no adenopathy, well-dressed, normal affect, normal respiratory effort. Examination of her left ankle and leg she has no ecchymosis or swelling in her ankle she allows me to flex and extend her ankle without any pain.  She does have some fluid  collection at the proximal incision where the tibial rod was removed mom says this is normal.  She has no ecchymosis compartments of the calf are soft nontender negative Homans' sign.  She is slightly tender to palpation over the lateral side of the leg.  Difficult to interpret this as patient is somnolent.  Negative Homans' sign  Imaging: XR Ankle Complete Left  Result Date: 06/18/2021 Radiographs of her left ankle were reviewed today.  Findings consistent with previous intramedullary rodding and rod removal of a left tibia.  On 1 AP projection cannot completely rule out that she has not refractured the fibula though in other projections this appears healed and may just be callus formation  XR Tibia/Fibula Left  Result Date: 06/18/2021 Views of her tib-fib were taken in multiple projections today.  She does have findings consistent with previous fracture of the midshaft tibia and fibula.  Has slight irregularity at the proximal fibula unable to determine if this is new would have to coordinate with exam.    No images are attached to the encounter.  Labs: No results found for: HGBA1C, ESRSEDRATE, CRP, LABURIC, REPTSTATUS, GRAMSTAIN, CULT, LABORGA   Lab Results  Component Value Date   ALBUMIN 3.1 (L) 06/26/2007  No results found for: MG No results found for: VD25OH  No results found for: PREALBUMIN    Latest Ref Rng & Units 03/18/2017   10:46 AM 06/28/2007   11:30 AM 06/28/2007    7:07 AM  CBC EXTENDED  WBC 3.4 - 10.8 x10E3/uL 9.8    6.7    RBC 3.77 - 5.28 x10E6/uL 3.88    2.73    Hemoglobin 11.1 - 15.9 g/dL 10.7   8.3   7.8 CRITICAL RESULT CALLED TO, READ BACK BY AND VERIFIED WITH: A.GILLEY,RN 06/28/07 0817 BY BSLADE    HCT 34.0 - 46.6 % 32.8    22.8    Platelets 150 - 379 x10E3/uL 388    211    NEUT# 1.4 - 7.0 x10E3/uL 6.6      Lymph# 0.7 - 3.1 x10E3/uL 1.9         There is no height or weight on file to calculate BMI.  Orders:  Orders Placed This Encounter  Procedures   XR  Ankle Complete Left   XR Tibia/Fibula Left   No orders of the defined types were placed in this encounter.    Procedures: No procedures performed  Clinical Data: No additional findings.  ROS:  All other systems negative, except as noted in the HPI. Review of Systems  Objective: Vital Signs: There were no vitals taken for this visit.  Specialty Comments:  No specialty comments available.  PMFS History: There are no problems to display for this patient.  History reviewed. No pertinent past medical history.  History reviewed. No pertinent family history.  History reviewed. No pertinent surgical history. Social History   Occupational History   Not on file  Tobacco Use   Smoking status: Not on file   Smokeless tobacco: Not on file  Substance and Sexual Activity   Alcohol use: Not on file   Drug use: Not on file   Sexual activity: Not on file

## 2021-06-19 ENCOUNTER — Inpatient Hospital Stay (HOSPITAL_COMMUNITY): Payer: Medicare Other

## 2021-06-19 ENCOUNTER — Emergency Department (HOSPITAL_COMMUNITY): Payer: Medicare Other

## 2021-06-19 ENCOUNTER — Inpatient Hospital Stay (HOSPITAL_COMMUNITY)
Admission: EM | Admit: 2021-06-19 | Discharge: 2021-07-17 | DRG: 853 | Disposition: A | Discharge status: Rehabilitation Facility | Payer: Medicare Other | Attending: Internal Medicine | Admitting: Internal Medicine

## 2021-06-19 ENCOUNTER — Encounter (HOSPITAL_COMMUNITY): Payer: Self-pay | Admitting: Critical Care Medicine

## 2021-06-19 DIAGNOSIS — I10 Essential (primary) hypertension: Secondary | ICD-10-CM | POA: Diagnosis not present

## 2021-06-19 DIAGNOSIS — N1832 Chronic kidney disease, stage 3b: Secondary | ICD-10-CM | POA: Diagnosis not present

## 2021-06-19 DIAGNOSIS — E8770 Fluid overload, unspecified: Secondary | ICD-10-CM | POA: Diagnosis not present

## 2021-06-19 DIAGNOSIS — S83272A Complex tear of lateral meniscus, current injury, left knee, initial encounter: Secondary | ICD-10-CM | POA: Diagnosis not present

## 2021-06-19 DIAGNOSIS — J9811 Atelectasis: Secondary | ICD-10-CM | POA: Diagnosis not present

## 2021-06-19 DIAGNOSIS — A419 Sepsis, unspecified organism: Secondary | ICD-10-CM | POA: Diagnosis not present

## 2021-06-19 DIAGNOSIS — I129 Hypertensive chronic kidney disease with stage 1 through stage 4 chronic kidney disease, or unspecified chronic kidney disease: Secondary | ICD-10-CM | POA: Diagnosis present

## 2021-06-19 DIAGNOSIS — L89323 Pressure ulcer of left buttock, stage 3: Secondary | ICD-10-CM | POA: Diagnosis not present

## 2021-06-19 DIAGNOSIS — R945 Abnormal results of liver function studies: Secondary | ICD-10-CM | POA: Diagnosis not present

## 2021-06-19 DIAGNOSIS — Z8673 Personal history of transient ischemic attack (TIA), and cerebral infarction without residual deficits: Secondary | ICD-10-CM | POA: Diagnosis not present

## 2021-06-19 DIAGNOSIS — I63312 Cerebral infarction due to thrombosis of left middle cerebral artery: Secondary | ICD-10-CM | POA: Diagnosis not present

## 2021-06-19 DIAGNOSIS — R7881 Bacteremia: Secondary | ICD-10-CM | POA: Diagnosis present

## 2021-06-19 DIAGNOSIS — M1712 Unilateral primary osteoarthritis, left knee: Secondary | ICD-10-CM | POA: Diagnosis not present

## 2021-06-19 DIAGNOSIS — L89313 Pressure ulcer of right buttock, stage 3: Secondary | ICD-10-CM | POA: Diagnosis not present

## 2021-06-19 DIAGNOSIS — D509 Iron deficiency anemia, unspecified: Secondary | ICD-10-CM | POA: Diagnosis not present

## 2021-06-19 DIAGNOSIS — H547 Unspecified visual loss: Secondary | ICD-10-CM | POA: Diagnosis not present

## 2021-06-19 DIAGNOSIS — R29724 NIHSS score 24: Secondary | ICD-10-CM | POA: Diagnosis present

## 2021-06-19 DIAGNOSIS — Z79899 Other long term (current) drug therapy: Secondary | ICD-10-CM

## 2021-06-19 DIAGNOSIS — M009 Pyogenic arthritis, unspecified: Secondary | ICD-10-CM | POA: Diagnosis present

## 2021-06-19 DIAGNOSIS — E872 Acidosis, unspecified: Secondary | ICD-10-CM | POA: Diagnosis not present

## 2021-06-19 DIAGNOSIS — R9431 Abnormal electrocardiogram [ECG] [EKG]: Secondary | ICD-10-CM | POA: Diagnosis not present

## 2021-06-19 DIAGNOSIS — W1830XA Fall on same level, unspecified, initial encounter: Secondary | ICD-10-CM | POA: Diagnosis present

## 2021-06-19 DIAGNOSIS — J90 Pleural effusion, not elsewhere classified: Secondary | ICD-10-CM | POA: Diagnosis not present

## 2021-06-19 DIAGNOSIS — D6489 Other specified anemias: Secondary | ICD-10-CM | POA: Diagnosis not present

## 2021-06-19 DIAGNOSIS — M869 Osteomyelitis, unspecified: Secondary | ICD-10-CM | POA: Diagnosis present

## 2021-06-19 DIAGNOSIS — I63412 Cerebral infarction due to embolism of left middle cerebral artery: Secondary | ICD-10-CM | POA: Diagnosis not present

## 2021-06-19 DIAGNOSIS — E876 Hypokalemia: Secondary | ICD-10-CM | POA: Diagnosis not present

## 2021-06-19 DIAGNOSIS — R601 Generalized edema: Secondary | ICD-10-CM | POA: Diagnosis not present

## 2021-06-19 DIAGNOSIS — I330 Acute and subacute infective endocarditis: Secondary | ICD-10-CM | POA: Diagnosis not present

## 2021-06-19 DIAGNOSIS — R4701 Aphasia: Secondary | ICD-10-CM | POA: Diagnosis present

## 2021-06-19 DIAGNOSIS — G459 Transient cerebral ischemic attack, unspecified: Secondary | ICD-10-CM | POA: Diagnosis not present

## 2021-06-19 DIAGNOSIS — Z20822 Contact with and (suspected) exposure to covid-19: Secondary | ICD-10-CM | POA: Diagnosis not present

## 2021-06-19 DIAGNOSIS — R4182 Altered mental status, unspecified: Secondary | ICD-10-CM | POA: Diagnosis not present

## 2021-06-19 DIAGNOSIS — I63512 Cerebral infarction due to unspecified occlusion or stenosis of left middle cerebral artery: Secondary | ICD-10-CM | POA: Diagnosis not present

## 2021-06-19 DIAGNOSIS — R296 Repeated falls: Secondary | ICD-10-CM | POA: Diagnosis present

## 2021-06-19 DIAGNOSIS — E785 Hyperlipidemia, unspecified: Secondary | ICD-10-CM | POA: Insufficient documentation

## 2021-06-19 DIAGNOSIS — D62 Acute posthemorrhagic anemia: Secondary | ICD-10-CM | POA: Diagnosis not present

## 2021-06-19 DIAGNOSIS — E1122 Type 2 diabetes mellitus with diabetic chronic kidney disease: Secondary | ICD-10-CM | POA: Diagnosis not present

## 2021-06-19 DIAGNOSIS — B9561 Methicillin susceptible Staphylococcus aureus infection as the cause of diseases classified elsewhere: Secondary | ICD-10-CM | POA: Diagnosis not present

## 2021-06-19 DIAGNOSIS — R791 Abnormal coagulation profile: Secondary | ICD-10-CM | POA: Diagnosis not present

## 2021-06-19 DIAGNOSIS — Z01810 Encounter for preprocedural cardiovascular examination: Secondary | ICD-10-CM | POA: Diagnosis not present

## 2021-06-19 DIAGNOSIS — A4101 Sepsis due to Methicillin susceptible Staphylococcus aureus: Principal | ICD-10-CM | POA: Diagnosis present

## 2021-06-19 DIAGNOSIS — R6520 Severe sepsis without septic shock: Secondary | ICD-10-CM | POA: Diagnosis present

## 2021-06-19 DIAGNOSIS — G039 Meningitis, unspecified: Secondary | ICD-10-CM | POA: Diagnosis not present

## 2021-06-19 DIAGNOSIS — M659 Synovitis and tenosynovitis, unspecified: Secondary | ICD-10-CM | POA: Diagnosis present

## 2021-06-19 DIAGNOSIS — E7800 Pure hypercholesterolemia, unspecified: Secondary | ICD-10-CM | POA: Diagnosis present

## 2021-06-19 DIAGNOSIS — N179 Acute kidney failure, unspecified: Secondary | ICD-10-CM | POA: Diagnosis present

## 2021-06-19 DIAGNOSIS — R570 Cardiogenic shock: Secondary | ICD-10-CM | POA: Diagnosis not present

## 2021-06-19 DIAGNOSIS — J9690 Respiratory failure, unspecified, unspecified whether with hypoxia or hypercapnia: Secondary | ICD-10-CM | POA: Diagnosis not present

## 2021-06-19 DIAGNOSIS — G928 Other toxic encephalopathy: Secondary | ICD-10-CM | POA: Diagnosis present

## 2021-06-19 DIAGNOSIS — F05 Delirium due to known physiological condition: Secondary | ICD-10-CM | POA: Diagnosis not present

## 2021-06-19 DIAGNOSIS — I3139 Other pericardial effusion (noninflammatory): Secondary | ICD-10-CM | POA: Diagnosis not present

## 2021-06-19 DIAGNOSIS — E860 Dehydration: Secondary | ICD-10-CM | POA: Diagnosis not present

## 2021-06-19 DIAGNOSIS — G9341 Metabolic encephalopathy: Secondary | ICD-10-CM | POA: Diagnosis present

## 2021-06-19 DIAGNOSIS — Z9911 Dependence on respirator [ventilator] status: Secondary | ICD-10-CM

## 2021-06-19 DIAGNOSIS — K6389 Other specified diseases of intestine: Secondary | ICD-10-CM | POA: Diagnosis not present

## 2021-06-19 DIAGNOSIS — S199XXA Unspecified injury of neck, initial encounter: Secondary | ICD-10-CM | POA: Diagnosis not present

## 2021-06-19 DIAGNOSIS — J9601 Acute respiratory failure with hypoxia: Secondary | ICD-10-CM | POA: Diagnosis not present

## 2021-06-19 DIAGNOSIS — R531 Weakness: Secondary | ICD-10-CM | POA: Diagnosis not present

## 2021-06-19 DIAGNOSIS — R7401 Elevation of levels of liver transaminase levels: Secondary | ICD-10-CM | POA: Diagnosis not present

## 2021-06-19 DIAGNOSIS — G929 Unspecified toxic encephalopathy: Secondary | ICD-10-CM | POA: Diagnosis not present

## 2021-06-19 DIAGNOSIS — E1169 Type 2 diabetes mellitus with other specified complication: Secondary | ICD-10-CM | POA: Diagnosis present

## 2021-06-19 DIAGNOSIS — I083 Combined rheumatic disorders of mitral, aortic and tricuspid valves: Secondary | ICD-10-CM | POA: Diagnosis present

## 2021-06-19 DIAGNOSIS — L8990 Pressure ulcer of unspecified site, unspecified stage: Secondary | ICD-10-CM | POA: Insufficient documentation

## 2021-06-19 DIAGNOSIS — S80211A Abrasion, right knee, initial encounter: Secondary | ICD-10-CM | POA: Diagnosis present

## 2021-06-19 DIAGNOSIS — D649 Anemia, unspecified: Secondary | ICD-10-CM | POA: Diagnosis not present

## 2021-06-19 DIAGNOSIS — I081 Rheumatic disorders of both mitral and tricuspid valves: Secondary | ICD-10-CM | POA: Diagnosis not present

## 2021-06-19 DIAGNOSIS — R1310 Dysphagia, unspecified: Secondary | ICD-10-CM | POA: Diagnosis present

## 2021-06-19 DIAGNOSIS — F209 Schizophrenia, unspecified: Secondary | ICD-10-CM | POA: Diagnosis present

## 2021-06-19 DIAGNOSIS — R7303 Prediabetes: Secondary | ICD-10-CM | POA: Insufficient documentation

## 2021-06-19 DIAGNOSIS — M7989 Other specified soft tissue disorders: Secondary | ICD-10-CM | POA: Diagnosis not present

## 2021-06-19 DIAGNOSIS — I639 Cerebral infarction, unspecified: Secondary | ICD-10-CM | POA: Diagnosis not present

## 2021-06-19 DIAGNOSIS — Z9889 Other specified postprocedural states: Secondary | ICD-10-CM | POA: Diagnosis not present

## 2021-06-19 DIAGNOSIS — I059 Rheumatic mitral valve disease, unspecified: Secondary | ICD-10-CM | POA: Diagnosis not present

## 2021-06-19 DIAGNOSIS — I6521 Occlusion and stenosis of right carotid artery: Secondary | ICD-10-CM | POA: Diagnosis not present

## 2021-06-19 DIAGNOSIS — R404 Transient alteration of awareness: Secondary | ICD-10-CM | POA: Diagnosis not present

## 2021-06-19 DIAGNOSIS — E1165 Type 2 diabetes mellitus with hyperglycemia: Secondary | ICD-10-CM | POA: Diagnosis present

## 2021-06-19 DIAGNOSIS — R918 Other nonspecific abnormal finding of lung field: Secondary | ICD-10-CM | POA: Diagnosis not present

## 2021-06-19 DIAGNOSIS — I959 Hypotension, unspecified: Secondary | ICD-10-CM | POA: Diagnosis not present

## 2021-06-19 DIAGNOSIS — I340 Nonrheumatic mitral (valve) insufficiency: Secondary | ICD-10-CM | POA: Diagnosis not present

## 2021-06-19 DIAGNOSIS — I9581 Postprocedural hypotension: Secondary | ICD-10-CM | POA: Diagnosis not present

## 2021-06-19 DIAGNOSIS — M00062 Staphylococcal arthritis, left knee: Secondary | ICD-10-CM | POA: Diagnosis not present

## 2021-06-19 DIAGNOSIS — G9340 Encephalopathy, unspecified: Secondary | ICD-10-CM

## 2021-06-19 DIAGNOSIS — R29818 Other symptoms and signs involving the nervous system: Secondary | ICD-10-CM | POA: Diagnosis not present

## 2021-06-19 DIAGNOSIS — R197 Diarrhea, unspecified: Secondary | ICD-10-CM | POA: Diagnosis not present

## 2021-06-19 DIAGNOSIS — S80212A Abrasion, left knee, initial encounter: Secondary | ICD-10-CM | POA: Diagnosis present

## 2021-06-19 DIAGNOSIS — I76 Septic arterial embolism: Secondary | ICD-10-CM | POA: Diagnosis present

## 2021-06-19 DIAGNOSIS — I4891 Unspecified atrial fibrillation: Secondary | ICD-10-CM | POA: Diagnosis not present

## 2021-06-19 DIAGNOSIS — G8191 Hemiplegia, unspecified affecting right dominant side: Secondary | ICD-10-CM | POA: Diagnosis not present

## 2021-06-19 DIAGNOSIS — I339 Acute and subacute endocarditis, unspecified: Secondary | ICD-10-CM | POA: Diagnosis not present

## 2021-06-19 DIAGNOSIS — G8220 Paraplegia, unspecified: Secondary | ICD-10-CM | POA: Diagnosis present

## 2021-06-19 DIAGNOSIS — R140 Abdominal distension (gaseous): Secondary | ICD-10-CM | POA: Diagnosis not present

## 2021-06-19 DIAGNOSIS — Z7401 Bed confinement status: Secondary | ICD-10-CM | POA: Diagnosis not present

## 2021-06-19 DIAGNOSIS — Z9181 History of falling: Secondary | ICD-10-CM

## 2021-06-19 DIAGNOSIS — Z4682 Encounter for fitting and adjustment of non-vascular catheter: Secondary | ICD-10-CM | POA: Diagnosis not present

## 2021-06-19 HISTORY — DX: Essential (primary) hypertension: I10

## 2021-06-19 HISTORY — DX: Schizophrenia, unspecified: F20.9

## 2021-06-19 HISTORY — DX: Hyperlipidemia, unspecified: E78.5

## 2021-06-19 HISTORY — DX: Iron deficiency anemia, unspecified: D50.9

## 2021-06-19 LAB — CBC WITH DIFFERENTIAL/PLATELET
Abs Immature Granulocytes: 0.25 10*3/uL — ABNORMAL HIGH (ref 0.00–0.07)
Basophils Absolute: 0 10*3/uL (ref 0.0–0.1)
Basophils Relative: 0 %
Eosinophils Absolute: 0 10*3/uL (ref 0.0–0.5)
Eosinophils Relative: 0 %
HCT: 27.1 % — ABNORMAL LOW (ref 36.0–46.0)
Hemoglobin: 8.6 g/dL — ABNORMAL LOW (ref 12.0–15.0)
Immature Granulocytes: 2 %
Lymphocytes Relative: 2 %
Lymphs Abs: 0.4 10*3/uL — ABNORMAL LOW (ref 0.7–4.0)
MCH: 24.9 pg — ABNORMAL LOW (ref 26.0–34.0)
MCHC: 31.7 g/dL (ref 30.0–36.0)
MCV: 78.3 fL — ABNORMAL LOW (ref 80.0–100.0)
Monocytes Absolute: 0.3 10*3/uL (ref 0.1–1.0)
Monocytes Relative: 2 %
Neutro Abs: 14.3 10*3/uL — ABNORMAL HIGH (ref 1.7–7.7)
Neutrophils Relative %: 94 %
Platelets: 196 10*3/uL (ref 150–400)
RBC: 3.46 MIL/uL — ABNORMAL LOW (ref 3.87–5.11)
RDW: 16.6 % — ABNORMAL HIGH (ref 11.5–15.5)
WBC: 15.2 10*3/uL — ABNORMAL HIGH (ref 4.0–10.5)
nRBC: 0 % (ref 0.0–0.2)

## 2021-06-19 LAB — BLOOD GAS, ARTERIAL
Acid-Base Excess: 2.9 mmol/L — ABNORMAL HIGH (ref 0.0–2.0)
Bicarbonate: 25.7 mmol/L (ref 20.0–28.0)
Drawn by: 42261
FIO2: 40 %
MECHVT: 470 mL
O2 Saturation: 99.9 %
PEEP: 5 cmH2O
Patient temperature: 38.3
RATE: 16 resp/min
pCO2 arterial: 35 mmHg (ref 32–48)
pH, Arterial: 7.48 — ABNORMAL HIGH (ref 7.35–7.45)
pO2, Arterial: 157 mmHg — ABNORMAL HIGH (ref 83–108)

## 2021-06-19 LAB — PROTIME-INR
INR: 1.2 (ref 0.8–1.2)
Prothrombin Time: 15.2 seconds (ref 11.4–15.2)

## 2021-06-19 LAB — HIV ANTIBODY (ROUTINE TESTING W REFLEX): HIV Screen 4th Generation wRfx: NONREACTIVE

## 2021-06-19 LAB — PREGNANCY, URINE: Preg Test, Ur: NEGATIVE

## 2021-06-19 LAB — URINALYSIS, ROUTINE W REFLEX MICROSCOPIC
Bacteria, UA: NONE SEEN
Bilirubin Urine: NEGATIVE
Glucose, UA: NEGATIVE mg/dL
Ketones, ur: NEGATIVE mg/dL
Leukocytes,Ua: NEGATIVE
Nitrite: NEGATIVE
Protein, ur: 100 mg/dL — AB
Specific Gravity, Urine: 1.018 (ref 1.005–1.030)
pH: 5 (ref 5.0–8.0)

## 2021-06-19 LAB — MAGNESIUM: Magnesium: 1.8 mg/dL (ref 1.7–2.4)

## 2021-06-19 LAB — COMPREHENSIVE METABOLIC PANEL
ALT: 77 U/L — ABNORMAL HIGH (ref 0–44)
AST: 98 U/L — ABNORMAL HIGH (ref 15–41)
Albumin: 2.1 g/dL — ABNORMAL LOW (ref 3.5–5.0)
Alkaline Phosphatase: 156 U/L — ABNORMAL HIGH (ref 38–126)
Anion gap: 13 (ref 5–15)
BUN: 32 mg/dL — ABNORMAL HIGH (ref 6–20)
CO2: 27 mmol/L (ref 22–32)
Calcium: 8.9 mg/dL (ref 8.9–10.3)
Chloride: 99 mmol/L (ref 98–111)
Creatinine, Ser: 1.53 mg/dL — ABNORMAL HIGH (ref 0.44–1.00)
GFR, Estimated: 42 mL/min — ABNORMAL LOW (ref >60)
Glucose, Bld: 129 mg/dL — ABNORMAL HIGH (ref 70–99)
Potassium: 2.8 mmol/L — ABNORMAL LOW (ref 3.5–5.1)
Sodium: 139 mmol/L (ref 135–145)
Total Bilirubin: 1.4 mg/dL — ABNORMAL HIGH (ref 0.3–1.2)
Total Protein: 7.4 g/dL (ref 6.5–8.1)

## 2021-06-19 LAB — AMMONIA: Ammonia: 33 umol/L (ref 9–35)

## 2021-06-19 LAB — RAPID URINE DRUG SCREEN, HOSP PERFORMED
Amphetamines: NOT DETECTED
Barbiturates: NOT DETECTED
Benzodiazepines: NOT DETECTED
Cocaine: NOT DETECTED
Opiates: NOT DETECTED
Tetrahydrocannabinol: NOT DETECTED

## 2021-06-19 LAB — I-STAT BETA HCG BLOOD, ED (MC, WL, AP ONLY): I-stat hCG, quantitative: 5.5 m[IU]/mL — ABNORMAL HIGH (ref <5)

## 2021-06-19 LAB — CK: Total CK: 39 U/L (ref 38–234)

## 2021-06-19 LAB — CBG MONITORING, ED: Glucose-Capillary: 116 mg/dL — ABNORMAL HIGH (ref 70–99)

## 2021-06-19 LAB — LACTIC ACID, PLASMA
Lactic Acid, Venous: 2.8 mmol/L (ref 0.5–1.9)
Lactic Acid, Venous: 1.9 mmol/L (ref 0.5–1.9)

## 2021-06-19 LAB — HCG, QUANTITATIVE, PREGNANCY: hCG, Beta Chain, Quant, S: 1 m[IU]/mL (ref <5)

## 2021-06-19 LAB — HEPATITIS PANEL, ACUTE
HCV Ab: NONREACTIVE
Hep A IgM: NONREACTIVE
Hep B C IgM: NONREACTIVE
Hepatitis B Surface Ag: NONREACTIVE

## 2021-06-19 LAB — ETHANOL: Alcohol, Ethyl (B): <10 mg/dL (ref <10)

## 2021-06-19 LAB — SARS CORONAVIRUS 2 BY RT PCR: SARS Coronavirus 2 by RT PCR: NEGATIVE

## 2021-06-19 LAB — GLUCOSE, CAPILLARY: Glucose-Capillary: 132 mg/dL — ABNORMAL HIGH (ref 70–99)

## 2021-06-19 MED ORDER — SODIUM CHLORIDE 0.9 % IV SOLN
1750.0000 mg | INTRAVENOUS | Status: AC
  Administered 2021-06-19: 1750 mg via INTRAVENOUS
  Filled 2021-06-19: qty 35

## 2021-06-19 MED ORDER — SODIUM CHLORIDE 0.9 % IV SOLN
2.0000 g | Freq: Once | INTRAVENOUS | Status: AC
  Administered 2021-06-19: 2 g via INTRAVENOUS
  Filled 2021-06-19: qty 20

## 2021-06-19 MED ORDER — DOCUSATE SODIUM 50 MG/5ML PO LIQD
100.0000 mg | Freq: Two times a day (BID) | ORAL | Status: DC
  Administered 2021-06-20 – 2021-06-22 (×6): 100 mg
  Filled 2021-06-19 (×6): qty 10

## 2021-06-19 MED ORDER — THIAMINE HCL 100 MG/ML IJ SOLN
500.0000 mg | Freq: Three times a day (TID) | INTRAVENOUS | Status: DC
  Administered 2021-06-19 – 2021-06-20 (×2): 500 mg via INTRAVENOUS
  Filled 2021-06-19 (×6): qty 5

## 2021-06-19 MED ORDER — VANCOMYCIN HCL IN DEXTROSE 1-5 GM/200ML-% IV SOLN
1000.0000 mg | INTRAVENOUS | Status: DC
Start: 2021-06-19 — End: 2021-06-19

## 2021-06-19 MED ORDER — ETOMIDATE 2 MG/ML IV SOLN
INTRAVENOUS | Status: AC
  Administered 2021-06-19: 20 mg via INTRAVENOUS
  Filled 2021-06-19: qty 20

## 2021-06-19 MED ORDER — DEXTROSE 5 % IV SOLN
10.0000 mg/kg | INTRAVENOUS | Status: AC
  Administered 2021-06-19: 820 mg via INTRAVENOUS
  Filled 2021-06-19: qty 16.4

## 2021-06-19 MED ORDER — CHLORHEXIDINE GLUCONATE CLOTH 2 % EX PADS
6.0000 | MEDICATED_PAD | Freq: Every day | CUTANEOUS | Status: DC
  Administered 2021-06-21 – 2021-06-27 (×7): 6 via TOPICAL

## 2021-06-19 MED ORDER — HEPARIN SODIUM (PORCINE) 5000 UNIT/ML IJ SOLN
5000.0000 [IU] | Freq: Three times a day (TID) | INTRAMUSCULAR | Status: DC
  Administered 2021-06-20 – 2021-06-23 (×11): 5000 [IU] via SUBCUTANEOUS
  Filled 2021-06-19 (×11): qty 1

## 2021-06-19 MED ORDER — PANTOPRAZOLE 2 MG/ML SUSPENSION
40.0000 mg | Freq: Every day | ORAL | Status: DC
  Administered 2021-06-20 – 2021-06-26 (×7): 40 mg
  Filled 2021-06-19 (×7): qty 20

## 2021-06-19 MED ORDER — DEXAMETHASONE SODIUM PHOSPHATE 10 MG/ML IJ SOLN
10.0000 mg | Freq: Four times a day (QID) | INTRAMUSCULAR | Status: DC
  Administered 2021-06-20 (×2): 10 mg via INTRAVENOUS
  Filled 2021-06-19 (×2): qty 1

## 2021-06-19 MED ORDER — VANCOMYCIN HCL 1250 MG/250ML IV SOLN: 1250.0000 mg | INTRAVENOUS | Status: DC

## 2021-06-19 MED ORDER — MIDAZOLAM HCL 2 MG/2ML IJ SOLN: 2.0000 mg | INTRAMUSCULAR | Status: DC | PRN

## 2021-06-19 MED ORDER — POTASSIUM CHLORIDE 10 MEQ/100ML IV SOLN
10.0000 meq | INTRAVENOUS | Status: AC
  Administered 2021-06-19 (×4): 10 meq via INTRAVENOUS
  Filled 2021-06-19 (×4): qty 100

## 2021-06-19 MED ORDER — INSULIN ASPART 100 UNIT/ML IJ SOLN
1.0000 [IU] | INTRAMUSCULAR | Status: DC
  Administered 2021-06-20 (×5): 1 [IU] via SUBCUTANEOUS
  Administered 2021-06-20 – 2021-06-21 (×2): 2 [IU] via SUBCUTANEOUS
  Administered 2021-06-21 (×4): 1 [IU] via SUBCUTANEOUS
  Administered 2021-06-21: 2 [IU] via SUBCUTANEOUS
  Administered 2021-06-22 – 2021-06-25 (×8): 1 [IU] via SUBCUTANEOUS
  Administered 2021-06-26: 2 [IU] via SUBCUTANEOUS
  Administered 2021-06-26 (×4): 1 [IU] via SUBCUTANEOUS

## 2021-06-19 MED ORDER — POTASSIUM CHLORIDE 20 MEQ PO PACK
20.0000 meq | PACK | Freq: Once | ORAL | Status: AC
  Administered 2021-06-20: 20 meq
  Filled 2021-06-19: qty 1

## 2021-06-19 MED ORDER — LIDOCAINE-EPINEPHRINE (PF) 2 %-1:200000 IJ SOLN
20.0000 mL | Freq: Once | INTRAMUSCULAR | Status: AC
  Administered 2021-06-19: 20 mL
  Filled 2021-06-19: qty 20

## 2021-06-19 MED ORDER — ORAL CARE MOUTH RINSE
15.0000 mL | OROMUCOSAL | Status: DC
  Administered 2021-06-19 – 2021-06-26 (×67): 15 mL via OROMUCOSAL

## 2021-06-19 MED ORDER — SUCCINYLCHOLINE CHLORIDE 200 MG/10ML IV SOSY
PREFILLED_SYRINGE | INTRAVENOUS | Status: AC
  Filled 2021-06-19: qty 10

## 2021-06-19 MED ORDER — VANCOMYCIN HCL 750 MG/150ML IV SOLN
750.0000 mg | Freq: Two times a day (BID) | INTRAVENOUS | Status: DC
  Administered 2021-06-20: 750 mg via INTRAVENOUS
  Filled 2021-06-19: qty 150

## 2021-06-19 MED ORDER — MIDAZOLAM HCL 2 MG/2ML IJ SOLN
2.0000 mg | INTRAMUSCULAR | Status: DC | PRN
  Filled 2021-06-19: qty 2

## 2021-06-19 MED ORDER — ASPIRIN 81 MG PO CHEW
81.0000 mg | CHEWABLE_TABLET | Freq: Every day | ORAL | Status: DC
  Administered 2021-06-20 – 2021-06-24 (×5): 81 mg
  Filled 2021-06-19 (×5): qty 1

## 2021-06-19 MED ORDER — PROSOURCE TF PO LIQD
45.0000 mL | Freq: Two times a day (BID) | ORAL | Status: DC
  Administered 2021-06-20 (×2): 45 mL
  Filled 2021-06-19 (×2): qty 45

## 2021-06-19 MED ORDER — POTASSIUM CHLORIDE IN NACL 20-0.9 MEQ/L-% IV SOLN
INTRAVENOUS | Status: DC
  Filled 2021-06-19 (×3): qty 1000

## 2021-06-19 MED ORDER — POLYETHYLENE GLYCOL 3350 17 G PO PACK
17.0000 g | PACK | Freq: Every day | ORAL | Status: DC
  Administered 2021-06-20 – 2021-06-21 (×2): 17 g
  Filled 2021-06-19 (×2): qty 1

## 2021-06-19 MED ORDER — ROCURONIUM BROMIDE 10 MG/ML (PF) SYRINGE
PREFILLED_SYRINGE | INTRAVENOUS | Status: AC
  Administered 2021-06-19: 100 mg via INTRAVENOUS
  Filled 2021-06-19: qty 10

## 2021-06-19 MED ORDER — ACETAMINOPHEN 650 MG RE SUPP
650.0000 mg | Freq: Once | RECTAL | Status: DC
  Filled 2021-06-19: qty 1

## 2021-06-19 MED ORDER — ONDANSETRON HCL 4 MG/2ML IJ SOLN
4.0000 mg | Freq: Four times a day (QID) | INTRAMUSCULAR | Status: DC | PRN
Start: 2021-06-19 — End: 2021-06-19

## 2021-06-19 MED ORDER — ONDANSETRON HCL 4 MG PO TABS
4.0000 mg | ORAL_TABLET | Freq: Four times a day (QID) | ORAL | Status: DC | PRN
Start: 2021-06-19 — End: 2021-06-19

## 2021-06-19 MED ORDER — PROPOFOL 1000 MG/100ML IV EMUL
0.0000 ug/kg/min | INTRAVENOUS | Status: DC
  Administered 2021-06-19 – 2021-06-20 (×2): 15 ug/kg/min via INTRAVENOUS
  Filled 2021-06-19 (×3): qty 100

## 2021-06-19 MED ORDER — VITAL HIGH PROTEIN PO LIQD
1000.0000 mL | ORAL | Status: DC
  Administered 2021-06-20: 1000 mL

## 2021-06-19 MED ORDER — ATORVASTATIN CALCIUM 40 MG PO TABS
40.0000 mg | ORAL_TABLET | Freq: Every day | ORAL | Status: DC
  Administered 2021-06-20 – 2021-06-24 (×5): 40 mg
  Filled 2021-06-19 (×5): qty 1

## 2021-06-19 MED ORDER — CHLORHEXIDINE GLUCONATE 0.12% ORAL RINSE (MEDLINE KIT)
15.0000 mL | Freq: Two times a day (BID) | OROMUCOSAL | Status: DC
  Administered 2021-06-19 – 2021-06-27 (×16): 15 mL via OROMUCOSAL
  Filled 2021-06-19: qty 15

## 2021-06-19 MED ORDER — ETOMIDATE 2 MG/ML IV SOLN: 20.0000 mg | Freq: Once | INTRAVENOUS | Status: AC

## 2021-06-19 MED ORDER — DEXTROSE 5 % IV SOLN
10.0000 mg/kg | Freq: Two times a day (BID) | INTRAVENOUS | Status: DC
  Filled 2021-06-19 (×2): qty 16.4

## 2021-06-19 MED ORDER — ACETAMINOPHEN 325 MG PO TABS
650.0000 mg | ORAL_TABLET | Freq: Four times a day (QID) | ORAL | Status: DC | PRN
  Administered 2021-06-21 – 2021-06-26 (×5): 650 mg
  Filled 2021-06-19 (×6): qty 2

## 2021-06-19 MED ORDER — LACTATED RINGERS IV BOLUS
1000.0000 mL | Freq: Once | INTRAVENOUS | Status: AC
  Administered 2021-06-19: 1000 mL via INTRAVENOUS

## 2021-06-19 MED ORDER — ROCURONIUM BROMIDE 10 MG/ML (PF) SYRINGE: 100.0000 mg | PREFILLED_SYRINGE | Freq: Once | INTRAVENOUS | Status: AC

## 2021-06-19 MED ORDER — FENTANYL CITRATE PF 50 MCG/ML IJ SOSY
50.0000 ug | PREFILLED_SYRINGE | INTRAMUSCULAR | Status: DC | PRN
  Administered 2021-06-24: 50 ug via INTRAVENOUS
  Filled 2021-06-19: qty 2
  Filled 2021-06-19: qty 4

## 2021-06-19 MED ORDER — FENTANYL CITRATE PF 50 MCG/ML IJ SOSY: 50.0000 ug | PREFILLED_SYRINGE | INTRAMUSCULAR | Status: DC | PRN

## 2021-06-19 MED ORDER — SODIUM CHLORIDE 0.9 % IV SOLN
2.0000 g | Freq: Two times a day (BID) | INTRAVENOUS | Status: DC
  Administered 2021-06-20: 2 g via INTRAVENOUS
  Filled 2021-06-19: qty 20

## 2021-06-19 NOTE — Plan of Care (Signed)
  Problem: Clinical Measurements: Goal: Will remain free from infection Outcome: Not Progressing Goal: Diagnostic test results will improve Outcome: Not Progressing

## 2021-06-19 NOTE — Hospital Course (Addendum)
46 year old female with past medical history of hypertension, hyperlipidemia, iron deficiency anemia, schizophrenia presenting 6/6 Clozapine/fluphenazine brought to the ED for evaluation of altered mental status.  Patient nonverbal although alert awake in the ED, history obtained from chart review ED physician and patient's mother in the ED we spoke with the ED provider. As per the report patient ambulatory and verbal at baseline, patient has not been eating or drinking well for past 2 weeks, also not taking medication has had multiple falls seen by orthopedic yesterday.  There is question of possible fracture on the left ankle and placed in cam boot.  He was found today on the floor by family after a possible fall, has not been verbal. In the ED work-up showed fever, leukocytosis, AKI, elevated LFTs, hyperkalemia, lactic acidosis.  Chest x-ray no obvious pneumonia and UA no evidence of UTI, had CT head and cervical spine that did not show any acute fracture or bleeding, COVID-19 negative, ammonia level normal 33 pregnancy test urine negative, INR 1.2, alcohol level <10.Ultrasound liver right upper quadrant no acute finding, unremarkable limited imaging. LP was attempted but unsuccessful.CK pending.Given IV fluids, started on empiric antibiotics and admission requested. On my exam patient is minimally responsive and nonverbal frequently dozing off.Mother at the bedside.She reports patient was able to take p.o. meds this morning and has been missing medication here in the ER, was getting NSAIDs for pain control.

## 2021-06-19 NOTE — ED Notes (Signed)
Pt opens eyes to name, but does not respond more than a groan when asked a question. Pt drooling on her self. EDP in to see pt.

## 2021-06-19 NOTE — ED Notes (Addendum)
20 etomidate at 2050  100 rocuronium at 2050  7.5 ett in at 2051  + color change at 2052  23 @ the lip

## 2021-06-19 NOTE — H&P (Signed)
History and Physical    Debra Klein ZOX:[id number] DOB: [birth year] DOA: 06/19/2021  PCP: Gaynelle Arabian, MD   Patient coming from: Home Chief Complaint  Patient presents with   Altered Mental Status   HPI: 46 year old female with past medical history of hypertension, hyperlipidemia, iron deficiency anemia, schizophrenia presenting 6/6 Clozapine/fluphenazine brought to the ED for evaluation of altered mental status.  Patient nonverbal although alert awake in the ED, history obtained from chart review ED physician and patient's mother in the ED we spoke with the ED provider. As per the report patient ambulatory and verbal at baseline, patient has not been eating or drinking well for past 2 weeks, also not taking medication has had multiple falls seen by orthopedic yesterday.  There is question of possible fracture on the left ankle and placed in cam boot.  He was found today on the floor by family after a possible fall, has not been verbal. In the ED work-up showed fever, leukocytosis, AKI, elevated LFTs, hyperkalemia, lactic acidosis.  Chest x-ray no obvious pneumonia and UA no evidence of UTI, had CT head and cervical spine that did not show any acute fracture or bleeding, COVID-19 negative, ammonia level normal 33 pregnancy test urine negative, INR 1.2, alcohol level <10.Ultrasound liver right upper quadrant no acute finding, unremarkable limited imaging. LP was attempted but unsuccessful.CK pending.Given IV fluids, started on empiric antibiotics and admission requested. On my exam patient is minimally responsive and nonverbal frequently dozing off.Mother at the bedside.She reports patient was able to take p.o. meds this morning and has been missing medication here in the ER, was getting NSAIDs for pain control.    Assessment/Plan Principal Problem:   Sepsis ([place]) Active Problems:   Acute encephalopathy   Essential hypertension   Hypercholesterolemia   Iron deficiency anemia    Prediabetes   Schizophrenia ([facility])   Lactic acidosis   Transaminitis   AKI (acute kidney injury) ([place])   Hypokalemia  Severe sepsis POA: With leukocytosis fever lactic acidosis.  Unclear source, no evidence of pneumonia or UTI. LP attempt by ED physician failed, consult IR for LP.  Discussed with neurology.  Empirically being covered with vancomycin Rocephin, add acyclovir. FU BLOOD CX Recent Labs  Lab 06/19/21 1341 06/19/21 1641  WBC 15.2*  --   LATICACIDVEN 2.8* 1.9    Acute encephalopathy: Unclear etiology, IR consult for LP.  Obtain EEG, check B1 B12, start high-dose thiamine, consulted  Dr. Bobetta Lime we discussed plan is to continue empiric coverage with antibiotics, adding acycloivir aa above. Ammonia normal UDS negative.  We will monitor in the stepdown unit  Lactic acidosis-resolved  Transaminitis: RUQ [place] neg. Check viral hepatitis panel.  Question medication-induced versus others.  Repeat LFTs in AM. Ck pending.  AKI: With very poor oral intake for several days, NSAIDs use.  Continue aggressive IV fluid hydration, repeat BMP in AM.  Recent multiple falls questionable left ankle fracture examination without any wound.  Continue cam boot.  PT OT evaluation once mentation improves.  Hypokalemia: Being repleted. Prediabetes check hemoglobin A1c, add SSI and monitor every 4 hours blood sugar Hypertension: BP stable holding meds due to sepsis Hyperlipidemia-hold her Lipitor due to elevated LFTs. Iron deficiency anemia hemoglobin close to baseline.  Monitor There is no height or weight on file to calculate BMI.   Severity of Illness: The appropriate patient status for this patient is INPATIENT. Inpatient status is judged to be reasonable and necessary in order to provide the required intensity of service to ensure  the patient's safety. The patient's presenting symptoms, physical exam findings, and initial radiographic and laboratory data in the context of their chronic  comorbidities is felt to place them at high risk for further clinical deterioration. Furthermore, it is not anticipated that the patient will be medically stable for discharge from the hospital within 2 midnights of admission.   * I certify that at the point of admission it is my clinical judgment that the patient will require inpatient hospital care spanning beyond 2 midnights from the point of admission due to high intensity of service, high risk for further deterioration and high frequency of surveillance required.*   DVT prophylaxis: SCDs Start: 06/19/21 1755 Code Status:   Code Status: Full Code  Family Communication: Admission, patients condition and plan of care including tests being ordered have been discussed with the patient/s mother who indicate understanding and agree with the plan and Code Status.  She understands patient is seriously ill being admitted to stepdown unit.  Consults called:    Review of Systems: All systems were reviewed and were negative except as mentioned in HPI above. Negative for fever Negative for chest pain Negative for shortness of breath  No past medical history on file.  No past surgical history on file.   has no history on file for tobacco use, alcohol use, and drug use.  No Known Allergies  No family history on file.   Prior to Admission medications   Medication Sig Start Date End Date Taking? Authorizing Provider  atorvastatin (LIPITOR) 20 MG tablet Take 20 mg by mouth daily. 06/16/20  Yes [provider]  benztropine (COGENTIN) 1 MG tablet Take 1 mg by mouth at bedtime. 05/08/20  Yes [provider]  cloZAPine (CLOZARIL) 100 MG tablet Take 400 mg by mouth at bedtime. 07/23/20  Yes [provider]  fluPHENAZine (PROLIXIN) 2.5 MG tablet Take 2.5 mg by mouth at bedtime. 07/24/20  Yes [provider]  ibuprofen (ADVIL) 200 MG tablet Take 200 mg by mouth once.   Yes [provider]  iron polysaccharides  (NIFEREX) 150 MG capsule Take 150 mg by mouth 2 (two) times daily. 05/30/20  Yes [provider]  meloxicam (MOBIC) 15 MG tablet TAKE 1 TABLET(15 MG) BY MOUTH DAILY AS NEEDED FOR PAIN Patient taking differently: Take 15 mg by mouth daily as needed for pain. 05/30/21  Yes Mcarthur Rossetti, MD  traZODone (DESYREL) 50 MG tablet Take 50 mg by mouth at bedtime as needed. Patient not taking: Reported on 06/19/2021 07/21/20   [provider]    Physical Exam: Vitals:   06/19/21 1600 06/19/21 1615 06/19/21 1630 06/19/21 1700  BP: 105/73 117/74 120/71 125/79  Pulse: (!) 101 (!) 103 (!) 102 (!) 119  Resp: 19 (!) 39 (!) 33 20  Temp:      TempSrc:      SpO2: 100% 100% 100% 100%    General exam: Drowsy lethargy minimally responding nonverbal  HEENT:Oral mucosa moist, Ear/Nose WNL grossly, dentition normal. Respiratory system: bilaterally clear,no wheezing or crackles,no use of accessory muscle Cardiovascular system: S1 & S2 +, No JVD,. Gastrointestinal system: Abdomen soft, NT,ND, BS+ Nervous System: Minimally responsive  Extremities: No edema, distal peripheral pulses palpable.  Skin: No rashes,no icterus. MSK: Normal muscle bulk,tone, power   Labs on Admission: I have personally reviewed following labs and imaging studies  CBC: Recent Labs  Lab 06/19/21 1341  WBC 15.2*  NEUTROABS 14.3*  HGB 8.6*  HCT 27.1*  MCV 78.3*  PLT 426   Basic Metabolic Panel: Recent Labs  Lab 06/19/21 1341  NA 139  K 2.8*  CL 99  CO2 27  GLUCOSE 129*  BUN 32*  CREATININE 1.53*  CALCIUM 8.9   GFR: CrCl cannot be calculated (Unknown ideal weight.). Liver Function Tests: Recent Labs  Lab 06/19/21 1341  AST 98*  ALT 77*  ALKPHOS 156*  BILITOT 1.4*  PROT 7.4  ALBUMIN 2.1*   No results for input(s): "LIPASE", "AMYLASE" in the last 168 hours. Recent Labs  Lab 06/19/21 1357  AMMONIA 33   Coagulation Profile: Recent Labs  Lab 06/19/21 1341  INR 1.2   Cardiac  Enzymes: No results for input(s): "CKTOTAL", "CKMB", "CKMBINDEX", "TROPONINI" in the last 168 hours. BNP (last 3 results) No results for input(s): "PROBNP" in the last 8760 hours. HbA1C: No results for input(s): "HGBA1C" in the last 72 hours. CBG: Recent Labs  Lab 06/19/21 1338  GLUCAP 116*   Lipid Profile: No results for input(s): "CHOL", "HDL", "LDLCALC", "TRIG", "CHOLHDL", "LDLDIRECT" in the last 72 hours. Thyroid Function Tests: No results for input(s): "TSH", "T4TOTAL", "FREET4", "T3FREE", "THYROIDAB" in the last 72 hours. Anemia Panel: No results for input(s): "VITAMINB12", "FOLATE", "FERRITIN", "TIBC", "IRON", "RETICCTPCT" in the last 72 hours. Urine analysis:    Component Value Date/Time   COLORURINE AMBER (A) 06/19/2021 1329   APPEARANCEUR HAZY (A) 06/19/2021 1329   LABSPEC 1.018 06/19/2021 1329   PHURINE 5.0 06/19/2021 1329   GLUCOSEU NEGATIVE 06/19/2021 1329   HGBUR SMALL (A) 06/19/2021 1329   BILIRUBINUR NEGATIVE 06/19/2021 1329   KETONESUR NEGATIVE 06/19/2021 1329   PROTEINUR 100 (A) 06/19/2021 1329   UROBILINOGEN 0.2 06/25/2007 2223   NITRITE NEGATIVE 06/19/2021 1329   LEUKOCYTESUR NEGATIVE 06/19/2021 1329    Radiological Exams on Admission: US Abdomen Limited RUQ (LIVER/GB)  Result Date: 06/19/2021 CLINICAL DATA:  [id number] elevated LFTs EXAM: ULTRASOUND ABDOMEN LIMITED RIGHT UPPER QUADRANT COMPARISON:  None Available. FINDINGS: Gallbladder: No gallstones or wall thickening visualized. Gallbladder wall measures 2.7 mm without pericholecystic fluid. No sonographic Murphy sign noted by sonographer. Common bile duct: Diameter: 2.7 mm Liver: No focal lesion identified. Within normal limits in parenchymal echogenicity. Portal vein is patent on color Doppler imaging with normal direction of blood flow towards the liver. Other: None. IMPRESSION: Unremarkable limited ultrasound imaging of the right upper quadrant of the abdomen. Electronically Signed   By: Frazier Richards M.D.    On: 06/19/2021 16:05   CT Cervical Spine Wo Contrast  Result Date: 06/19/2021 CLINICAL DATA:  Neck trauma, intoxicated or obtunded (Age >= 16y) EXAM: CT CERVICAL SPINE WITHOUT CONTRAST TECHNIQUE: Multidetector CT imaging of the cervical spine was performed without intravenous contrast. Multiplanar CT image reconstructions were also generated. RADIATION DOSE REDUCTION: This exam was performed according to the departmental dose-optimization program which includes automated exposure control, adjustment of the mA and/or kV according to patient size and/or use of iterative reconstruction technique. COMPARISON:  None Available. FINDINGS: Alignment: Preserved. Skull base and vertebrae: Vertebral body heights are maintained. No acute fracture. Soft tissues and spinal canal: No prevertebral fluid or swelling. No visible canal hematoma. Disc levels:  Intervertebral disc heights are maintained. Upper chest: Included lung apices are clear. Other: Trace calcified plaque at the right common carotid bifurcation. IMPRESSION: No acute fracture. Electronically Signed   By: Macy Mis M.D.   On: 06/19/2021 14:59   CT Head Wo Contrast  Result Date: 06/19/2021 CLINICAL DATA:  Mental status change, unknown cause EXAM: CT HEAD WITHOUT CONTRAST  TECHNIQUE: Contiguous axial images were obtained from the base of the skull through the vertex without intravenous contrast. RADIATION DOSE REDUCTION: This exam was performed according to the departmental dose-optimization program which includes automated exposure control, adjustment of the mA and/or kV according to patient size and/or use of iterative reconstruction technique. COMPARISON:  None Available. FINDINGS: Brain: There is no acute intracranial hemorrhage, mass effect, or edema. Gray-white differentiation is preserved. There is no extra-axial fluid collection. Ventricles and sulci are within normal limits in size and configuration. Vascular: No hyperdense vessel or unexpected  calcification. Skull: Calvarium is unremarkable. Sinuses/Orbits: No acute finding. Other: None. IMPRESSION: No acute intracranial abnormality. Electronically Signed   By: Macy Mis M.D.   On: 06/19/2021 14:57   DG Chest Port 1 View  Result Date: 06/19/2021 CLINICAL DATA:  Altered mental status.  Schizophrenia. EXAM: PORTABLE CHEST 1 VIEW COMPARISON:  06/25/2007 FINDINGS: The heart size and mediastinal contours are within normal limits. Both lungs are clear. The visualized skeletal structures are unremarkable. IMPRESSION: No active disease. Electronically Signed   By: Nelson Chimes M.D.   On: 06/19/2021 14:19   XR Ankle Complete Left  Result Date: 06/18/2021 Radiographs of her left ankle were reviewed today.  Findings consistent with previous intramedullary rodding and rod removal of a left tibia.  On 1 AP projection cannot completely rule out that she has not refractured the fibula though in other projections this appears healed and may just be callus formation  XR Tibia/Fibula Left  Result Date: 06/18/2021 Views of her tib-fib were taken in multiple projections today.  She does have findings consistent with previous fracture of the midshaft tibia and fibula.  Has slight irregularity at the proximal fibula unable to determine if this is new would have to coordinate with exam.      Antonieta Pert MD [facility]  If 7PM-7AM, please contact night-coverage [email or web address]  06/19/2021, 6:03 PM

## 2021-06-19 NOTE — Consult Note (Addendum)
Neurology Consultation Reason for Consult: AMS, fever, stroke vs. Seizure concern Requesting Physician: Noemi Chapel  CC: Leg pain, AMS  History is obtained from: Chart review and family by phone  HPI: Debra Klein is a 46 y.o. female presenting with altered mental status and increased falls with a past medical history significant for hypertension, hyperlipidemia, schizophrenia.  Family reports that about 3 weeks ago the patient started to be less verbal, but was still managing her medications. They were noticing for the last few days they would find a pill or 2 dropped on the floor although she was still overall taking her medications.  Her verbal output was actually improving in the last week or so, but then acutely today she was completely nonverbal and so they came to the ED for further evaluation.  They report she did not have any other complaints and did not notice any fevers or any physical issues other than left leg swelling and pain  Per chart review, she stopped taking her medications approximately 1 week ago, had a fall on 06/15/2021 with left ankle swelling, became nonverbal on 6/6, and subsequently had another fall on 6/7 found to have a left lower leg fracture and discharged with a cam walker boot after radiographs of her left ankle and tibia/fibula revealed previous intramedullary rodding and rod removal of the left tibia without clear evidence of acute fracture.  Subsequently she fell again on 6/8 and was found to be febrile (101.2), tachycardic to 110s, noted to be nonverbal, not following commands, frequently dozing off but withdrawing in all 4 extremities without a facial droop.  She was started on vancomycin, ceftriaxone, acyclovir for meningitis coverage.  Additionally, per hospitalist admission note LP was attempted but unsuccessful at bedside.  Subsequently at approximately 9 PM the patient had a change in mental status (drooling) with gaze deviation to the left and was intubated  with head CT demonstrating an insular hypodensity concerning for MCA infarct discussed with Dr. Carlis Abbott and then with myself.  On review of the chart she additionally had a cortisol injection on 06/03/2021 in the left knee which she gets intermittently for management of osteoarthritis, and there are phone notes regarding her having issues with that leg subsequently.     LKW:  tPA given?: No, out of the window, c/f endocarditis  IA performed?: No Premorbid modified rankin scale:      1 - No significant disability. Able to carry out all usual activities, despite some symptoms. Lives with her mother, manages her medications, ambulates independently, does shopping for herself, but does not drive or cook  ROS: Unable to obtain due to altered mental status.   Past Medical History:  Diagnosis Date   Hyperlipemia    Hypertension    IDA (iron deficiency anemia)    Schizophrenia (HCC)    No past surgical history on file.  Current Outpatient Medications  Medication Instructions   atorvastatin (LIPITOR) 20 mg, Oral, Daily   benztropine (COGENTIN) 1 mg, Oral, Daily at bedtime   cloZAPine (CLOZARIL) 400 mg, Oral, Daily at bedtime   fluPHENAZine (PROLIXIN) 2.5 mg, Oral, Daily at bedtime   ibuprofen (ADVIL) 200 mg, Oral,  Once   iron polysaccharides (NIFEREX) 150 mg, Oral, 2 times daily   meloxicam (MOBIC) 15 MG tablet TAKE 1 TABLET(15 MG) BY MOUTH DAILY AS NEEDED FOR PAIN   traZODone (DESYREL) 50 mg, At bedtime PRN     Scheduled Meds:  acetaminophen  650 mg Rectal Once   [START ON 06/20/2021] aspirin  81 mg Per Tube Daily   [START ON 06/20/2021] atorvastatin  40 mg Per Tube Daily   chlorhexidine gluconate (MEDLINE KIT)  15 mL Mouth Rinse BID   [START ON 06/20/2021] Chlorhexidine Gluconate Cloth  6 each Topical Daily   [START ON 06/20/2021] dexamethasone (DECADRON) injection  10 mg Intravenous Q6H   docusate  100 mg Per Tube BID   feeding supplement (PROSource TF)  45 mL Per Tube BID   feeding  supplement (VITAL HIGH PROTEIN)  1,000 mL Per Tube Q24H   heparin injection (subcutaneous)  5,000 Units Subcutaneous Q8H   [START ON 06/20/2021] insulin aspart  1-3 Units Subcutaneous Q4H   [START ON 06/20/2021] mouth rinse  15 mL Mouth Rinse 10 times per day   [START ON 06/20/2021] pantoprazole sodium  40 mg Per Tube Daily   [START ON 06/20/2021] polyethylene glycol  17 g Per Tube Daily   potassium chloride  20 mEq Per Tube Once   succinylcholine       Continuous Infusions:  0.9 % NaCl with KCl 20 mEq / L 125 mL/hr at 06/19/21 2201   [START ON 06/20/2021] acyclovir     [START ON 06/20/2021] cefTRIAXone (ROCEPHIN)  IV     propofol (DIPRIVAN) infusion 15 mcg/kg/min (06/19/21 2241)   thiamine injection Stopped (06/19/21 2033)   [START ON 06/20/2021] vancomycin     PRN Meds:.acetaminophen, fentaNYL (SUBLIMAZE) injection, fentaNYL (SUBLIMAZE) injection, midazolam, midazolam, succinylcholine   No family history on file.  Social History:  has no history on file for tobacco use, alcohol use, and drug use.   Exam: Current vital signs: BP (!) 143/81   Pulse (!) 121   Temp (!) 100.8 F (38.2 C)   Resp 16   Ht $R'5\' 6"'tv$  (1.676 m)   Wt 81.9 kg   SpO2 99%   BMI 29.14 kg/m  Vital signs in last 24 hours: Temp:  [99.1 F (37.3 C)-101.2 F (38.4 C)] 100.8 F (38.2 C) (06/08 2300) Pulse Rate:  [101-129] 121 (06/08 2300) Resp:  [13-39] 16 (06/08 2300) BP: (101-164)/(62-103) 143/81 (06/08 2330) SpO2:  [98 %-100 %] 99 % (06/08 2330) FiO2 (%):  [40 %] 40 % (06/08 2159) Weight:  [81.6 kg-81.9 kg] 81.9 kg (06/08 2159)  Physical Exam  Constitutional: Appears acutely ill, intubated, localizes to tube when sedation is paused Psych: Minimally interactive with examiner even with sedation paused Eyes: Scleral edema is present (mild, bilateral) HENT: ET tube in place MSK: left lower extremity in a boot Cardiovascular: Tachycardic, regular rate Respiratory: Breathing comfortably on the ventilator, with  sedation paused she coughs GI: Soft.  No distension. There is no tenderness.  Skin: Scattered abrasions including bilateral knees   Neuro: Mental Status: Opens eyes to noxious stimulation Does not follow any commands Cranial Nerves: II: No blink to threat. Pupils are equal, round, and reactive to light. III,IV, VI/VIII: EOMI to VOR, does not orient to voice and otherwise gaze is midline V/VII: Facial sensation is symmetric to eyelash brush VIII: No clear response to voice X/XI: Intact cough/gag XII: Unable to assess tongue protrusion secondary to patient's mental status  Motor/Sensory: Tone is normal. Bulk is normal.  She is spontaneous with the left upper extremity.  Left lower extremity is in a boot and has trace movement to noxious stimulation.  Slight movement of the right lower extremity likely triple flexion, extensor posturing of the right upper extremity Deep Tendon Reflexes: 2+ and symmetric brachioradialis 2+ in the right patella, left patella elicits guarding/pain response.  Plantars: Toes are mute bilaterally.  Cerebellar: Unable to assess secondary to patient's mental status    NIHSS total 24 Score breakdown: One-point for drowsiness, 2 points for not answering questions, 2 points for not following commands, 3 points for no blink to threat throughout, one-point for left upper extremity weakness, 3 points for right upper extremity weakness, 3 points for left lower extremity weakness, 3 points for right lower extremity weakness, one-point for less responsiveness to noxious stimulation on the right compared to left, 3 points for being mute (reported prior to intubation), 2 points for dysarthria Performed at 1:15 AM 6/9   I have reviewed labs in epic and the results pertinent to this consultation are: Labs are notable for potassium of 2.8, glucose 129, creatinine 1.53 (GFR 42) with a BUN of 32, alk phos elevation of 156, AST 98, ALT 77, CK 39, leukocytosis to 15.3 with  microcytic anemia with hemoglobin of 8.6 and platelets of 196 and increased RDW (16.6).  Lab Results  Component Value Date   VITAMINB12 245 06/20/2021    Lab Results  Component Value Date   TSH 0.856 06/20/2021   Lab Results  Component Value Date   HGBA1C 6.5 (H) 06/20/2021   Lab Results  Component Value Date   CHOL 161 06/20/2021   HDL <10 (L) 06/20/2021   LDLCALC UNABLE TO CALCULATE IF TRIGLYCERIDE OVER 400 mg/dL 06/20/2021   TRIG 479 (H) 06/20/2021   CHOLHDL  UNABLE TO CALCULATE 06/20/2021   UA with small hemoglobin pigment, leukocytes, no nitrites, proteinuria (100), 11-20 red blood cells per high-powered field,  I have reviewed the images obtained:  Head CT personally reviewed Insula, M2 hypodensity, aspects 8 Loss of gray-white matter differentiation along the left insula, concerning for infarction. Recommend CTA head/neck and/or brain MRI.  MRI brain personally reviewed, agree with radiology 1. Large left MCA territory cortical infarct. No hemorrhage or mass effect. 2. Scattered punctate foci of acute ischemia within the right hemisphere and left occipital lobe. Discussed findings with radiology, including potential utility of CTA/CTP  Assessment: This is a 46 year old woman with past medical history of schizophrenia, hypertension, hyperlipidemia, presenting with fever, altered mental status, and found to have multifocal strokes, with an acute change in mental status in the ED resulting in intubation.  I think most likely the event in the ED as best as I can piece together represents seizure followed by postictal Todd's phenomenon that is clearing.  However given her MRI findings I am also concerned about the possibility of endocarditis with multiple embolic events; I will obtain a CTA and CT perfusion studies to confirm that there is not a significant penumbra at risk given her examination is limited at this time secondary to intubation/sedation and history as provided by  family and in chart review is somewhat unclear as to the extent of her symptoms at different times.  DDx  - Endocarditis with multiple embolic events - Subacute Stroke c/b seizure (with ED event representing post-ictal state) - Vasculitic meningitis such as VZV (less likely)  Impression:  - fever of unknown source at this time, - sepsis c/b transaminitis - possible seizure - chronic anemia, history of iron deficiency - borderline low B12 - CKD (Baseline Cr ~1.5)  Initial Recommendations: -CTA/CT perfusion to confirm no intervenable embolic event  CTA, CT perfusion personally reviewed, agree with radiology: Infarction Location:No infarction by CT perfusion analysis. However, this is likely artifactual, because there is completed infarction demonstrated on the noncontrast CT and the MRI, which  often confounds perfusion data.   IMPRESSION: 1. Occlusion of a left MCA M2 branch in the insula. No other intracranial arterial occlusion or high-grade stenosis. 2. CT perfusion does not show a significant region of penumbra. 3. No embolic source identified within the neck. Echocardiogram may be helpful to assess for potential cardiac embolic source.  Additional recommendations:  # Left MCA inferior division branch stroke -Hold on antiplatelets given concern for endocarditis -EEG on transfer to [facility], with LTM given her fluctuating mental status -Goal normotension, given she is out of the window for permissive hypertension -Stroke work-up: echocardiogram, will need TEE if TTE is negative -Without a clear source of fever, lumbar puncture is still prudent to allow narrowing of antibiotics; please note that a pleocytosis of up to approximately 20 would be expected in the setting of her stroke.  However if another source is elucidated, this study could be deferred as her stroke explains her nonverbal state and right-sided weakness -Stroke team to follow  # Borderline B12 - MMA - Empiric B12  supplementation for goal B12 > 400 (1000 mcg IM x 1 dose, followed by 1000 mcg oral daily)  Lesleigh Noe MD-PhD [facility] [phone] Available 7 PM to 7 AM, outside of these hours please call Neurologist on call as listed on Amion.  Total critical care time: 90 minutes   Critical care time was exclusive of separately billable procedures and treating other patients.   Critical care was necessary to treat or prevent imminent or life-threatening deterioration.   Critical care was time spent personally by me on the following activities: development of treatment plan with patient and/or surrogate as well as nursing, discussions with consultants/primary team, evaluation of patient's response to treatment, examination of patient, obtaining history from patient or surrogate, ordering and performing treatments and interventions, ordering and review of laboratory studies, ordering and review of radiographic studies, and re-evaluation of patient's condition as needed, as documented above.

## 2021-06-19 NOTE — ED Provider Notes (Signed)
[facility] DEPT Provider Note   CSN: [id number] Arrival date & time: 06/19/21  1310     History  Chief Complaint  Patient presents with   Altered Mental Status    Debra Klein is a 46 y.o. female.  Level 5 caveat secondary to altered mental status.  She has a history of schizophrenia and reportedly has not taken her medicine in a few days.  She had recent falls and was seen in orthopedics.  Work-up showed a possible refracture of an old injury was placed in a cam walker boot.  Per EMS was found today on the floor by family after a possible fall.  Patient unable to give any history.  The history is provided by the EMS personnel.  Altered Mental Status Presenting symptoms: lethargy and partial responsiveness   Most recent episode:  2 days ago Episode history:  Continuous Duration:  2 days Timing:  Constant Progression:  Worsening Chronicity:  New Context: head injury and not taking medications as prescribed        Home Medications Prior to Admission medications   Medication Sig Start Date End Date Taking? Authorizing Provider  atorvastatin (LIPITOR) 20 MG tablet Take 20 mg by mouth daily. 06/16/20   [provider]  benztropine (COGENTIN) 1 MG tablet Take 1 mg by mouth at bedtime. 05/08/20   [provider]  cloZAPine (CLOZARIL) 100 MG tablet Take 400 mg by mouth at bedtime. 07/23/20   [provider]  fluPHENAZine (PROLIXIN) 2.5 MG tablet Take 2.5 mg by mouth at bedtime. 07/24/20   [provider]  iron polysaccharides (NIFEREX) 150 MG capsule Take 150 mg by mouth 2 (two) times daily. 05/30/20   [provider]  meloxicam (MOBIC) 15 MG tablet TAKE 1 TABLET(15 MG) BY MOUTH DAILY AS NEEDED FOR PAIN 05/30/21   Mcarthur Rossetti, MD  traZODone (DESYREL) 50 MG tablet Take 50 mg by mouth at bedtime as needed. 07/21/20   [provider]      Allergies    Patient has no known allergies.    Review  of Systems   Review of Systems  Unable to perform ROS: Mental status change    Physical Exam Updated Vital Signs BP (!) 129/103 (BP Location: Right Arm)   Pulse (!) 112   Temp (!) 101.2 F (38.4 C) (Axillary)   Resp (!) 23   SpO2 100%  Physical Exam Vitals and nursing note reviewed.  Constitutional:      General: She is not in acute distress.    Appearance: Normal appearance. She is well-developed.  HENT:     Head: Normocephalic and atraumatic.  Eyes:     Conjunctiva/sclera: Conjunctivae normal.  Cardiovascular:     Rate and Rhythm: Regular rhythm. Tachycardia present.     Heart sounds: No murmur heard. Pulmonary:     Effort: Pulmonary effort is normal. No respiratory distress.     Breath sounds: Normal breath sounds.  Abdominal:     Palpations: Abdomen is soft.     Tenderness: There is no abdominal tenderness. There is no guarding or rebound.  Musculoskeletal:        General: No swelling.     Cervical back: Neck supple.     Comments: left lower extremity in cam boot.  Distal cap refill intact.  Other extremities full range of motion without any pain or limitations.  Skin:    General: Skin is warm and dry.     Capillary Refill: Capillary refill  takes less than 2 seconds.  Neurological:     General: No focal deficit present.     Comments: She will her eyes to voice.  Is withdrawing all 4 extremities.  No gross focal deficits no facial droop.  Patient not following commands however.     ED Results / Procedures / Treatments   Labs (all labs ordered are listed, but only abnormal results are displayed) Labs Reviewed  CBC WITH DIFFERENTIAL/PLATELET - Abnormal; Notable for the following components:      Result Value   WBC 15.2 (*)    RBC 3.46 (*)    Hemoglobin 8.6 (*)    HCT 27.1 (*)    MCV 78.3 (*)    MCH 24.9 (*)    RDW 16.6 (*)    Neutro Abs 14.3 (*)    Lymphs Abs 0.4 (*)    Abs Immature Granulocytes 0.25 (*)    All other components within normal limits   COMPREHENSIVE METABOLIC PANEL - Abnormal; Notable for the following components:   Potassium 2.8 (*)    Glucose, Bld 129 (*)    BUN 32 (*)    Creatinine, Ser 1.53 (*)    Albumin 2.1 (*)    AST 98 (*)    ALT 77 (*)    Alkaline Phosphatase 156 (*)    Total Bilirubin 1.4 (*)    GFR, Estimated 42 (*)    All other components within normal limits  URINALYSIS, ROUTINE W REFLEX MICROSCOPIC - Abnormal; Notable for the following components:   Color, Urine AMBER (*)    APPearance HAZY (*)    Hgb urine dipstick SMALL (*)    Protein, ur 100 (*)    All other components within normal limits  LACTIC ACID, PLASMA - Abnormal; Notable for the following components:   Lactic Acid, Venous 2.8 (*)    All other components within normal limits  CBG MONITORING, ED - Abnormal; Notable for the following components:   Glucose-Capillary 116 (*)    All other components within normal limits  I-STAT BETA HCG BLOOD, ED (MC, WL, AP ONLY) - Abnormal; Notable for the following components:   I-stat hCG, quantitative 5.5 (*)    All other components within normal limits  SARS CORONAVIRUS 2 BY RT PCR  CULTURE, BLOOD (ROUTINE X 2)  CULTURE, BLOOD (ROUTINE X 2)  CSF CULTURE W GRAM STAIN  AMMONIA  PROTIME-INR  ETHANOL  RAPID URINE DRUG SCREEN, HOSP PERFORMED  LACTIC ACID, PLASMA  PREGNANCY, URINE  HEPATITIS PANEL, ACUTE  MAGNESIUM  CSF CELL COUNT WITH DIFFERENTIAL  PROTEIN AND GLUCOSE, CSF  CK  HIV ANTIBODY (ROUTINE TESTING W REFLEX)  HEMOGLOBIN A1C  TSH  VITAMIN D14  BASIC METABOLIC PANEL  VITAMIN B1  BASIC METABOLIC PANEL  HCG, QUANTITATIVE, PREGNANCY    EKG EKG Interpretation  Date/Time:  Thursday June 19 2021 13:27:04 EDT Ventricular Rate:  112 PR Interval:  145 QRS Duration: 85 QT Interval:  357 QTC Calculation: 488 R Axis:   52 Text Interpretation: Sinus tachycardia Minimal ST depression, inferior leads Borderline prolonged QT interval tremor and poor baseline Confirmed by Aletta Edouard  [phone]) on 06/19/2021 1:38:34 PM  Radiology XR Ankle Complete Left  Result Date: 06/18/2021 Radiographs of her left ankle were reviewed today.  Findings consistent with previous intramedullary rodding and rod removal of a left tibia.  On 1 AP projection cannot completely rule out that she has not refractured the fibula though in other projections this appears healed and may just be  callus formation  XR Tibia/Fibula Left  Result Date: 06/18/2021 Views of her tib-fib were taken in multiple projections today.  She does have findings consistent with previous fracture of the midshaft tibia and fibula.  Has slight irregularity at the proximal fibula unable to determine if this is new would have to coordinate with exam.     Procedures .Lumbar Puncture  Date/Time: 06/19/2021 4:56 PM  Performed by: Hayden Rasmussen, MD Authorized by: Hayden Rasmussen, MD   Consent:    Consent given by:  Parent   Risks, benefits, and alternatives were discussed: yes     Risks discussed:  Bleeding, headache, nerve damage, pain, repeat procedure and infection   Alternatives discussed:  No treatment, delayed treatment and alternative treatment Universal protocol:    Procedure explained and questions answered to patient or proxy's satisfaction: yes     Relevant documents present and verified: yes     Test results available: yes     Immediately prior to procedure a time out was called: yes     Site/side marked: yes     Patient identity confirmed:  Arm band Pre-procedure details:    Procedure purpose:  Diagnostic   Preparation: Patient was prepped and draped in usual sterile fashion   Anesthesia:    Anesthesia method:  Local infiltration   Local anesthetic:  Lidocaine 2% WITH epi Procedure details:    Lumbar space:  L3-L4 interspace   Patient position:  L lateral decubitus   Ultrasound guidance: no     Number of attempts:  3 Post-procedure details:    Puncture site:  Adhesive bandage applied   Procedure  completion:  Tolerated well, no immediate complications Comments:     Unsuccessful tap .Critical Care  Performed by: Hayden Rasmussen, MD Authorized by: Hayden Rasmussen, MD   Critical care provider statement:    Critical care time (minutes):  45   Critical care time was exclusive of:  Separately billable procedures and treating other patients   Critical care was necessary to treat or prevent imminent or life-threatening deterioration of the following conditions:  CNS failure or compromise   Critical care was time spent personally by me on the following activities:  Development of treatment plan with patient or surrogate, discussions with consultants, evaluation of patient's response to treatment, examination of patient, obtaining history from patient or surrogate, ordering and performing treatments and interventions, ordering and review of laboratory studies, ordering and review of radiographic studies, pulse oximetry, re-evaluation of patient's condition and review of old charts   I assumed direction of critical care for this patient from another provider in my specialty: no       Medications Ordered in ED Medications  acetaminophen (TYLENOL) suppository 650 mg (0 mg Rectal Hold 06/19/21 1407)  potassium chloride 10 mEq in 100 mL IVPB (10 mEq Intravenous New Bag/Given 06/19/21 1827)  0.9 % NaCl with KCl 20 mEq/ L  infusion (has no administration in time range)  ondansetron (ZOFRAN) tablet 4 mg (has no administration in time range)    Or  ondansetron (ZOFRAN) injection 4 mg (has no administration in time range)  thiamine 500mg  in normal saline (68ml) IVPB (has no administration in time range)  acyclovir (ZOVIRAX) 820 mg in dextrose 5 % 250 mL IVPB (820 mg Intravenous New Bag/Given 06/19/21 1848)  vancomycin (VANCOCIN) 1,750 mg in sodium chloride 0.9 % 500 mL IVPB (has no administration in time range)  cefTRIAXone (ROCEPHIN) 2 g in sodium chloride 0.9 % 100  mL IVPB (has no administration in  time range)  lactated ringers bolus 1,000 mL (1,000 mLs Intravenous New Bag/Given 06/19/21 1405)  lidocaine-EPINEPHrine (XYLOCAINE W/EPI) 2 %-1:200000 (PF) injection 20 mL (20 mLs Infiltration Given 06/19/21 1703)  cefTRIAXone (ROCEPHIN) 2 g in sodium chloride 0.9 % 100 mL IVPB (0 g Intravenous Stopped 06/19/21 1826)    ED Course/ Medical Decision Making/ A&P Clinical Course as of 06/19/21 1655  Thu Jun 19, 2021  1337 Patient's mother just showed up.  She said the patient has not been eating or drinking much for the last 2 weeks.  Possibly also not taking her medications.  She said she said multiple falls saw the orthopedics yesterday.  Today she had another unwitnessed fall.  She said the patient is usually ambulatory without difficulty and verbal. [MB]  1422 Chest x-ray interpreted by me as no acute infiltrates.  Awaiting radiology reading. [MB]  1506 CT head and cervical spine did not show any acute fracture or bleed.  Talk screen negative.  COVID-negative.  Labs showing low potassium elevated LFTs.  Does have a white count.  Hemoglobin low but similar to priors.  Lactate elevated.  Getting IV fluids.  No clear infectious source identified yet [MB]    Clinical Course User Index [MB] Hayden Rasmussen, MD                           Medical Decision Making Amount and/or Complexity of Data Reviewed Labs: ordered. Radiology: ordered.  Risk OTC drugs. Prescription drug management. Decision regarding hospitalization.   This patient complains of altered mental status poor p.o. intake frequent falls; this involves an extensive number of treatment Options and is a complaint that carries with it a high risk of complications and morbidity. The differential includes stroke, bleed, infection, metabolic derangement, dehydration, psychiatric illness  I ordered, reviewed and interpreted labs, which included CBC with elevated white count, hemoglobin low similar to priors, chemistries with low potassium  elevated creatinine, elevated LFTs, urinalysis without clear signs of infection, pregnancy test equivocal, hepatitis panel negative, lactate elevated but cleared, urine pregnancy test negative I ordered medication IV fluids and potassium, IV antibiotics and reviewed PMP when indicated. I ordered imaging studies which included CT head, chest x-ray, right upper quadrant ultrasound and I independently    visualized and interpreted imaging which showed no acute findings Additional history obtained from patient's mother and sister Previous records obtained and reviewed in epic including recent orthopedic notes I consulted [facility] hospitalist Dr. Lupita Leash And discussed lab and imaging findings and discussed disposition.  Cardiac monitoring reviewed, sinus tachycardia Social determinants considered, patient with significant mental health issues impeding her care Critical Interventions: Initiation of broad work-up for altered mental status including imaging and lab work and lumbar puncture  After the interventions stated above, I reevaluated the patient and found patient to be somewhat more alert although not back to baseline. Admission and further testing considered, patient will benefit from admission for further work-up of her altered mental status.         Final Clinical Impression(s) / ED Diagnoses Final diagnoses:  Altered mental status, unspecified altered mental status type  Transaminitis  Dehydration    Rx / DC Orders ED Discharge Orders     None         Hayden Rasmussen, MD 06/19/21 1909

## 2021-06-19 NOTE — ED Provider Notes (Signed)
8:55 PM I was called to bedside as the patient had what seemed to be change in mental status, was noninteractive, with pupils fixed to the left.  With concern for protection of her airway she had intubation performed without complication.  After intubation I have reviewed the patient's head CT, no bleed, however, I subsequently discussed it with our radiologist and there are some concern for an insular ribbon sign which may represent MCA infarct.  Patient had already gone to the ICU at this point but I discussed her case with our intensivist, Dr. Carlis Abbott.   INTUBATION Performed by: Carmin Muskrat  Required items: required blood products, implants, devices, and special equipment available Patient identity confirmed: provided demographic data and hospital-assigned identification number Time out: Immediately prior to procedure a "time out" was called to verify the correct patient, procedure, equipment, support staff and site/side marked as required.  Indications: airway protection  Intubation method: Glidescope Laryngoscopy   Preoxygenation: BVM  Sedatives: 20Etomidate Paralytic: 100 rocuronium  Tube Size: 7.5 cuffed  Post-procedure assessment: chest rise and ETCO2 monitor Breath sounds: equal and absent over the epigastrium Tube secured with: ETT holder Chest x-ray interpreted by radiologist and me.  Chest x-ray findings: endotracheal tube in appropriate position  Patient tolerated the procedure well with no immediate complications.     Carmin Muskrat, MD 06/19/21 2215

## 2021-06-19 NOTE — H&P (Signed)
NAME:  Debra Klein, MRN:  [id number], DOB:  Dec 01, 1975, LOS: 0 ADMISSION DATE:  06/19/2021, CONSULTATION DATE:  06/19/21 REFERRING MD:  Laurena Spies, CHIEF COMPLAINT:  respiratory failure   History of Present Illness:  Debra Klein is a 46 year old woman with a history of schizophrenia who presented to the hospital today with acute encephalopathy for about 1 day.  She stopped taking her psychiatric medications about a week ago.  Yesterday she had a fall with a left lower extremity injury for which she was seen by orthopedic surgery--likely she has a repeat fracture of her left fibula.  Today she presented to the emergency department by EMS due to altered mental status.  Her baseline is being able to be independent in ADLs and taking her own medications.  She has not been talking for the last 2 days, which is confirmed from the orthopedic surgery notes yesterday.  Throughout the day in the emergency department she developed worsening mentation, eventually requiring intubation for altered mental status.  She presented with a temperature of 101.2 and has remained tachycardic throughout her stay in the emergency department.  An LP was attempted but was unsuccessful.  She was started on empiric vancomycin, ceftriaxone, acyclovir.  Head CT is concerning for MCA stroke.  Pertinent  Medical History  Schizophrenia HTN anemia  Significant Hospital Events: Including procedures, antibiotic start and stop dates in addition to other pertinent events   6/7 fall, fibula fracture, nonverbal as an outpatient 6/8 presenting to the ED nonverbal, altered mentation, progressively worsening to require intubation.  Empirically treated for sepsis, presumed meningitis.  Interim History / Subjective:    Objective   Blood pressure (!) 152/86, pulse (!) 129, temperature (!) 100.9 F (38.3 C), temperature source Bladder, resp. rate 16, height 5\' 6"  (1.676 m), weight 81.9 kg, SpO2 100 %.    Vent Mode: PRVC FiO2 (%):  [40  %] 40 % Set Rate:  [16 bmp] 16 bmp Vt Set:  [470 mL] 470 mL PEEP:  [5 cmH20] 5 cmH20 Plateau Pressure:  [14 cmH20] 14 cmH20   Intake/Output Summary (Last 24 hours) at 06/19/2021 2240 Last data filed at 06/19/2021 2201 Gross per 24 hour  Intake 882.65 ml  Output 300 ml  Net 582.65 ml   Filed Weights   06/19/21 1844 06/19/21 2159  Weight: 81.6 kg 81.9 kg    Examination: General: Critically ill-appearing woman lying in bed intubated, not sedated HENT: [place]/AT, eyes anicteric, endotracheal tube in place Lungs: CTAB, minimal thin secretions from endotracheal tube. Cardiovascular: S1-S2, tachycardic, regular rhythm Abdomen: Soft, nontender, hypoactive bowel sounds Extremities: Left lower extremity in walking boot.  No right lower extremity edema. Neuro: RASS -5, no response to trapezius squeeze or nailbed pressure x4 extremities.  Pinpoint pupils bilaterally.  No significant cough reflex. Derm: Warm, dry, no diffuse rashes  UA: 6-10 WBCs Potassium 2.8 BUN 32 Creatinine 1.53 AST 98 ALT 77 Bilirubin 1.4 Lactic acid 2.8> 1.9 Beta hCG-negative WBC 15.2 H/H 8.6/27.1  Resolved Hospital Problem list     Assessment & Plan:  Acute encephalopathy, concern this could be septic versus due to acute stroke.  New onset seizures with nonconvulsive status epilepticus is also potentially possible, especially with her recent stroke.  Her underlying psychiatric disease does not fully explain this presentation. - Empiric antibiotics for meningitis.  LP was previously unsuccessful today.  May need IR to perform tomorrow - Needs stat brain MRI and transferred to Debra Klein for neurology consultation - Propofol empirically until an EEG is  able to be obtained  Sepsis, unknown source currently.  No infiltrate on x-ray or sputum to suggest pneumonia.  UA and right upper quadrant ultrasound do not reveal source. - Follow blood cultures - Needs an LP - Continue empiric antibiotics for meningitis and  encephalitis plus steroids, droplet precautions Hypokalemia -Repleted IV and oral - Recheck in the morning  Lactic acidosis resolved - No additional monitoring needed unless hemodynamic changes  Elevated transaminases and hyperbilirubinemia due to sepsis - Continue to monitor  CKD  3b -Renally dose meds, avoid nephrotoxic meds - Strict I's/O - Monitor  Chronic anemia, history of iron deficiency - Transfuse for hemoglobin less than 7 or hemodynamically significant bleeding - Monitor  Hyperglycemia - Check A1c - SSI as needed  Best Practice (right click and "Reselect all SmartList Selections" daily)   Diet/type: tubefeeds DVT prophylaxis: prophylactic heparin  GI prophylaxis: PPI Lines: N/A Foley:  Yes, and it is still needed Code Status:  full code Last date of multidisciplinary goals of care discussion [ ]   Labs   CBC: Recent Labs  Lab 06/19/21 1341  WBC 15.2*  NEUTROABS 14.3*  HGB 8.6*  HCT 27.1*  MCV 78.3*  PLT 967    Basic Metabolic Panel: Recent Labs  Lab 06/19/21 1341 06/19/21 1820  NA 139  --   K 2.8*  --   CL 99  --   CO2 27  --   GLUCOSE 129*  --   BUN 32*  --   CREATININE 1.53*  --   CALCIUM 8.9  --   MG  --  1.8   GFR: Estimated Creatinine Clearance: 49.5 mL/min (A) (by C-G formula based on SCr of 1.53 mg/dL (H)). Recent Labs  Lab 06/19/21 1341 06/19/21 1641  WBC 15.2*  --   LATICACIDVEN 2.8* 1.9    Liver Function Tests: Recent Labs  Lab 06/19/21 1341  AST 98*  ALT 77*  ALKPHOS 156*  BILITOT 1.4*  PROT 7.4  ALBUMIN 2.1*   No results for input(s): "LIPASE", "AMYLASE" in the last 168 hours. Recent Labs  Lab 06/19/21 1357  AMMONIA 33    ABG No results found for: "PHART", "PCO2ART", "PO2ART", "HCO3", "TCO2", "ACIDBASEDEF", "O2SAT"   Coagulation Profile: Recent Labs  Lab 06/19/21 1341  INR 1.2    Cardiac Enzymes: Recent Labs  Lab 06/19/21 1820  CKTOTAL 39    HbA1C: No results found for:  "HGBA1C"  CBG: Recent Labs  Lab 06/19/21 1338 06/19/21 2207  GLUCAP 116* 132*    Review of Systems:   Unable to be obtained due to mental status  Past Medical History:  She,  has a past medical history of Hyperlipemia, Hypertension, IDA (iron deficiency anemia), and Schizophrenia (Debra Klein).   Surgical History:  No past surgical history on file.   Social History:      Family History:  Her family history is not on file.   Allergies No Known Allergies   Home Medications  Prior to Admission medications   Medication Sig Start Date End Date Taking? Authorizing Provider  atorvastatin (LIPITOR) 20 MG tablet Take 20 mg by mouth daily. 06/16/20  Yes [provider]  benztropine (COGENTIN) 1 MG tablet Take 1 mg by mouth at bedtime. 05/08/20  Yes [provider]  cloZAPine (CLOZARIL) 100 MG tablet Take 400 mg by mouth at bedtime. 07/23/20  Yes [provider]  fluPHENAZine (PROLIXIN) 2.5 MG tablet Take 2.5 mg by mouth at bedtime. 07/24/20  Yes [provider]  ibuprofen (  ADVIL) 200 MG tablet Take 200 mg by mouth once.   Yes [provider]  iron polysaccharides (NIFEREX) 150 MG capsule Take 150 mg by mouth 2 (two) times daily. 05/30/20  Yes [provider]  meloxicam (MOBIC) 15 MG tablet TAKE 1 TABLET(15 MG) BY MOUTH DAILY AS NEEDED FOR PAIN Patient taking differently: Take 15 mg by mouth daily as needed for pain. 05/30/21  Yes Mcarthur Rossetti, MD  traZODone (DESYREL) 50 MG tablet Take 50 mg by mouth at bedtime as needed. Patient not taking: Reported on 06/19/2021 07/21/20   [provider]     Critical care time: 50 min.     Julian Hy, DO 06/19/21 10:40 PM [facility]

## 2021-06-19 NOTE — Progress Notes (Signed)
Pharmacy Antibiotic Note  Debra Klein is a 46 y.o. female admitted on 06/19/2021 with sepsis, concern for meningitis.  Pharmacy has been consulted for Vancomycin and Acyclovir dosing.  Plan: Vancomycin 1750mg  IV x 1 given in the ED. Continue with Vancomycin 750mg  IV q12h to target trough level 15-20 mcg/mL. Vancomycin trough level at steady state, as indicated. Ceftriaxone 2g IV q12h per MD. Acyclovir 10mg /kg (820mg ) IV q12h. Monitor renal function, cultures, clinical course.   Height: 5\' 6"  (167.6 cm) Weight: 81.9 kg (180 lb 8.9 oz) IBW/kg (Calculated) : 59.3  Temp (24hrs), Avg:99.9 F (37.7 C), Min:99.1 F (37.3 C), Max:101.2 F (38.4 C)  Recent Labs  Lab 06/19/21 1341 06/19/21 1641  WBC 15.2*  --   CREATININE 1.53*  --   LATICACIDVEN 2.8* 1.9    Estimated Creatinine Clearance: 49.5 mL/min (A) (by C-G formula based on SCr of 1.53 mg/dL (H)).    No Known Allergies  Antimicrobials this admission: 6/8 Vancomycin >> 6/8 Ceftriaxone >> 6/8 Acyclovir >>  Dose adjustments this admission: --  Microbiology results: 6/8 BCx:  6/8 MRSA PCR:    Thank you for allowing pharmacy to be a part of this patient's care.   Lindell Spar, PharmD, BCPS Clinical Pharmacist  06/19/2021 10:18 PM

## 2021-06-19 NOTE — Progress Notes (Signed)
Pt transported to MRI and back to room while on mechanical ventilation.  Pt remained stable and comfortable throughout trip.  Suctioning and oral care done prior to trip.

## 2021-06-19 NOTE — ED Triage Notes (Signed)
Pt arrives via [facility] from home for altered mental status. Pt reportedly has hx of schizophrenia, normally taking her meds well and able to perform all ADLs. Pt's family told EMS that she stopped taking her medications approximately one week ago. Pt had a fall yesterday and was seen, notable for L lower leg fx. Pt has been non-verbal according to family for two days. Pt was found today in bathroom supine after a fall. Pt alert, but unable to verbalize.

## 2021-06-19 NOTE — Progress Notes (Signed)
A consult was received from an ED physician for Vancomycin per pharmacy dosing.  The patient's profile has been reviewed for ht/wt/allergies/indication/available labs.  Most recent height documented in chart was from 07/29/2020 (5'6.5"). Enriqueta Shutter, RN, estimates patient's weight to be 180 pounds.   A one time order has been placed for Vancomycin 1750mg  IV.  Further antibiotics/pharmacy consults should be ordered by admitting physician if indicated.                       Thank you, Luiz Ochoa 06/19/2021  6:14 PM

## 2021-06-19 NOTE — ED Notes (Signed)
Pt feels cool to the touch, warm blankets applied.

## 2021-06-19 NOTE — ED Notes (Signed)
Date and time results received: 06/19/21 1429  (use smartphrase ".now" to insert current time)  Test: Lactic Acid  Critical Value: 2.8  Name of Provider Notified: Dr. Carin Hock  Orders Received? Or Actions Taken?:  Repeat lactic

## 2021-06-20 ENCOUNTER — Inpatient Hospital Stay (HOSPITAL_COMMUNITY): Payer: Medicare Other

## 2021-06-20 ENCOUNTER — Encounter (HOSPITAL_COMMUNITY): Payer: Self-pay | Admitting: Critical Care Medicine

## 2021-06-20 DIAGNOSIS — F209 Schizophrenia, unspecified: Secondary | ICD-10-CM

## 2021-06-20 DIAGNOSIS — R6520 Severe sepsis without septic shock: Secondary | ICD-10-CM | POA: Diagnosis not present

## 2021-06-20 DIAGNOSIS — J9601 Acute respiratory failure with hypoxia: Secondary | ICD-10-CM | POA: Diagnosis present

## 2021-06-20 DIAGNOSIS — G9341 Metabolic encephalopathy: Secondary | ICD-10-CM

## 2021-06-20 DIAGNOSIS — B9561 Methicillin susceptible Staphylococcus aureus infection as the cause of diseases classified elsewhere: Secondary | ICD-10-CM | POA: Diagnosis present

## 2021-06-20 DIAGNOSIS — R4182 Altered mental status, unspecified: Secondary | ICD-10-CM | POA: Diagnosis not present

## 2021-06-20 DIAGNOSIS — A419 Sepsis, unspecified organism: Secondary | ICD-10-CM | POA: Diagnosis not present

## 2021-06-20 DIAGNOSIS — R7881 Bacteremia: Secondary | ICD-10-CM | POA: Diagnosis present

## 2021-06-20 DIAGNOSIS — I63412 Cerebral infarction due to embolism of left middle cerebral artery: Secondary | ICD-10-CM

## 2021-06-20 DIAGNOSIS — A4101 Sepsis due to Methicillin susceptible Staphylococcus aureus: Secondary | ICD-10-CM

## 2021-06-20 DIAGNOSIS — G9340 Encephalopathy, unspecified: Secondary | ICD-10-CM | POA: Diagnosis not present

## 2021-06-20 DIAGNOSIS — G929 Unspecified toxic encephalopathy: Secondary | ICD-10-CM | POA: Diagnosis present

## 2021-06-20 LAB — BASIC METABOLIC PANEL
Anion gap: 11 (ref 5–15)
Anion gap: 8 (ref 5–15)
BUN: 27 mg/dL — ABNORMAL HIGH (ref 6–20)
BUN: 28 mg/dL — ABNORMAL HIGH (ref 6–20)
CO2: 25 mmol/L (ref 22–32)
CO2: 26 mmol/L (ref 22–32)
Calcium: 7.9 mg/dL — ABNORMAL LOW (ref 8.9–10.3)
Calcium: 8.4 mg/dL — ABNORMAL LOW (ref 8.9–10.3)
Chloride: 102 mmol/L (ref 98–111)
Chloride: 99 mmol/L (ref 98–111)
Creatinine, Ser: 1.09 mg/dL — ABNORMAL HIGH (ref 0.44–1.00)
Creatinine, Ser: 1.19 mg/dL — ABNORMAL HIGH (ref 0.44–1.00)
GFR, Estimated: 57 mL/min — ABNORMAL LOW (ref >60)
GFR, Estimated: >60 mL/min (ref >60)
Glucose, Bld: 110 mg/dL — ABNORMAL HIGH (ref 70–99)
Glucose, Bld: 126 mg/dL — ABNORMAL HIGH (ref 70–99)
Potassium: 3.1 mmol/L — ABNORMAL LOW (ref 3.5–5.1)
Potassium: 3.4 mmol/L — ABNORMAL LOW (ref 3.5–5.1)
Sodium: 135 mmol/L (ref 135–145)
Sodium: 136 mmol/L (ref 135–145)

## 2021-06-20 LAB — BLOOD CULTURE ID PANEL (REFLEXED) - BCID2

## 2021-06-20 LAB — CBC WITH DIFFERENTIAL/PLATELET
Abs Immature Granulocytes: 0.15 10*3/uL — ABNORMAL HIGH (ref 0.00–0.07)
Basophils Absolute: 0 10*3/uL (ref 0.0–0.1)
Basophils Relative: 0 %
Eosinophils Absolute: 0 10*3/uL (ref 0.0–0.5)
Eosinophils Relative: 0 %
HCT: 25.9 % — ABNORMAL LOW (ref 36.0–46.0)
Hemoglobin: 8.5 g/dL — ABNORMAL LOW (ref 12.0–15.0)
Immature Granulocytes: 1 %
Lymphocytes Relative: 7 %
Lymphs Abs: 1 10*3/uL (ref 0.7–4.0)
MCH: 25.3 pg — ABNORMAL LOW (ref 26.0–34.0)
MCHC: 32.8 g/dL (ref 30.0–36.0)
MCV: 77.1 fL — ABNORMAL LOW (ref 80.0–100.0)
Monocytes Absolute: 0.2 10*3/uL (ref 0.1–1.0)
Monocytes Relative: 2 %
Neutro Abs: 12.3 10*3/uL — ABNORMAL HIGH (ref 1.7–7.7)
Neutrophils Relative %: 90 %
Platelets: 109 10*3/uL — ABNORMAL LOW (ref 150–400)
RBC: 3.36 MIL/uL — ABNORMAL LOW (ref 3.87–5.11)
RDW: 16.6 % — ABNORMAL HIGH (ref 11.5–15.5)
Smear Review: NORMAL
WBC: 13.6 10*3/uL — ABNORMAL HIGH (ref 4.0–10.5)
nRBC: 0 % (ref 0.0–0.2)

## 2021-06-20 LAB — ECHOCARDIOGRAM COMPLETE
Area-P 1/2: 4.44 cm2
Calc EF: 51 %
Height: 66 in
MV M vel: 5.02 m/s
MV Peak grad: 100.8 mmHg
Radius: 0.4 cm
S' Lateral: 3.2 cm
Single Plane A2C EF: 48 %
Single Plane A4C EF: 54.7 %
Weight: 2959.46 oz

## 2021-06-20 LAB — GLUCOSE, CAPILLARY
Glucose-Capillary: 109 mg/dL — ABNORMAL HIGH (ref 70–99)
Glucose-Capillary: 137 mg/dL — ABNORMAL HIGH (ref 70–99)
Glucose-Capillary: 139 mg/dL — ABNORMAL HIGH (ref 70–99)
Glucose-Capillary: 140 mg/dL — ABNORMAL HIGH (ref 70–99)
Glucose-Capillary: 143 mg/dL — ABNORMAL HIGH (ref 70–99)
Glucose-Capillary: 145 mg/dL — ABNORMAL HIGH (ref 70–99)
Glucose-Capillary: 158 mg/dL — ABNORMAL HIGH (ref 70–99)

## 2021-06-20 LAB — IRON AND TIBC
Iron: 28 ug/dL (ref 28–170)
Saturation Ratios: 21 % (ref 10.4–31.8)
TIBC: 133 ug/dL — ABNORMAL LOW (ref 250–450)
UIBC: 105 ug/dL

## 2021-06-20 LAB — HEMOGLOBIN A1C
Hgb A1c MFr Bld: 6.5 % — ABNORMAL HIGH (ref 4.8–5.6)
Mean Plasma Glucose: 139.85 mg/dL

## 2021-06-20 LAB — HEPATIC FUNCTION PANEL
ALT: 63 U/L — ABNORMAL HIGH (ref 0–44)
AST: 85 U/L — ABNORMAL HIGH (ref 15–41)
Albumin: 1.7 g/dL — ABNORMAL LOW (ref 3.5–5.0)
Alkaline Phosphatase: 129 U/L — ABNORMAL HIGH (ref 38–126)
Bilirubin, Direct: 0.7 mg/dL — ABNORMAL HIGH (ref 0.0–0.2)
Indirect Bilirubin: 0.9 mg/dL (ref 0.3–0.9)
Total Bilirubin: 1.6 mg/dL — ABNORMAL HIGH (ref 0.3–1.2)
Total Protein: 7 g/dL (ref 6.5–8.1)

## 2021-06-20 LAB — LIPID PANEL
Cholesterol: 161 mg/dL (ref 0–200)
HDL: <10 mg/dL — ABNORMAL LOW (ref >40)
LDL Cholesterol: UNDETERMINED mg/dL (ref 0–99)
Total CHOL/HDL Ratio: UNDETERMINED RATIO
Triglycerides: 522 mg/dL — ABNORMAL HIGH (ref <150)
VLDL: UNDETERMINED mg/dL (ref 0–40)

## 2021-06-20 LAB — TRIGLYCERIDES: Triglycerides: 479 mg/dL — ABNORMAL HIGH (ref <150)

## 2021-06-20 LAB — TSH: TSH: 0.856 u[IU]/mL (ref 0.350–4.500)

## 2021-06-20 LAB — VITAMIN B12: Vitamin B-12: 245 pg/mL (ref 180–914)

## 2021-06-20 LAB — MRSA NEXT GEN BY PCR, NASAL
MRSA by PCR Next Gen: DETECTED — AB
MRSA by PCR Next Gen: DETECTED — AB

## 2021-06-20 LAB — PHOSPHORUS: Phosphorus: 1.9 mg/dL — ABNORMAL LOW (ref 2.5–4.6)

## 2021-06-20 LAB — MAGNESIUM: Magnesium: 1.8 mg/dL (ref 1.7–2.4)

## 2021-06-20 LAB — LDL CHOLESTEROL, DIRECT: Direct LDL: 21.9 mg/dL (ref 0–99)

## 2021-06-20 MED ORDER — SODIUM CHLORIDE (PF) 0.9 % IJ SOLN
INTRAMUSCULAR | Status: AC
  Filled 2021-06-20: qty 50

## 2021-06-20 MED ORDER — OSMOLITE 1.5 CAL PO LIQD
1000.0000 mL | ORAL | Status: DC
  Administered 2021-06-20 – 2021-06-26 (×4): 1000 mL

## 2021-06-20 MED ORDER — CYANOCOBALAMIN 1000 MCG/ML IJ SOLN
1000.0000 ug | Freq: Once | INTRAMUSCULAR | Status: AC
  Administered 2021-06-20: 1000 ug via INTRAMUSCULAR
  Filled 2021-06-20: qty 1

## 2021-06-20 MED ORDER — POTASSIUM PHOSPHATES 15 MMOLE/5ML IV SOLN
30.0000 mmol | Freq: Once | INTRAVENOUS | Status: AC
  Administered 2021-06-20: 30 mmol via INTRAVENOUS
  Filled 2021-06-20: qty 10

## 2021-06-20 MED ORDER — PROSOURCE TF PO LIQD
45.0000 mL | Freq: Three times a day (TID) | ORAL | Status: DC
  Administered 2021-06-20 – 2021-06-26 (×18): 45 mL
  Filled 2021-06-20 (×18): qty 45

## 2021-06-20 MED ORDER — STROKE: EARLY STAGES OF RECOVERY BOOK
Freq: Once | Status: AC
  Filled 2021-06-20: qty 1

## 2021-06-20 MED ORDER — FENTANYL 2500MCG IN NS 250ML (10MCG/ML) PREMIX INFUSION
0.0000 ug/h | INTRAVENOUS | Status: DC
  Administered 2021-06-20: 50 ug/h via INTRAVENOUS
  Filled 2021-06-20: qty 250

## 2021-06-20 MED ORDER — WHITE PETROLATUM EX OINT
TOPICAL_OINTMENT | CUTANEOUS | Status: DC | PRN
  Filled 2021-06-20: qty 28.35

## 2021-06-20 MED ORDER — MUPIROCIN 2 % EX OINT
1.0000 "application " | TOPICAL_OINTMENT | Freq: Two times a day (BID) | CUTANEOUS | Status: AC
  Administered 2021-06-20 – 2021-06-24 (×10): 1 via NASAL
  Filled 2021-06-20 (×2): qty 22

## 2021-06-20 MED ORDER — MAGNESIUM SULFATE 2 GM/50ML IV SOLN
2.0000 g | Freq: Once | INTRAVENOUS | Status: AC
  Administered 2021-06-20: 2 g via INTRAVENOUS
  Filled 2021-06-20: qty 50

## 2021-06-20 MED ORDER — SODIUM CHLORIDE 0.9 % IV SOLN
12.0000 g | INTRAVENOUS | Status: DC
  Administered 2021-06-20 – 2021-06-28 (×7): 12 g via INTRAVENOUS
  Filled 2021-06-20: qty 8
  Filled 2021-06-20 (×9): qty 48

## 2021-06-20 MED ORDER — POTASSIUM CHLORIDE 20 MEQ PO PACK
40.0000 meq | PACK | Freq: Once | ORAL | Status: AC
Start: 2021-06-20 — End: 2021-06-20
  Administered 2021-06-20: 40 meq
  Filled 2021-06-20: qty 2

## 2021-06-20 MED ORDER — FREE WATER: 200.0000 mL | Freq: Four times a day (QID) | Status: DC

## 2021-06-20 MED ORDER — VITAMIN B-12 1000 MCG PO TABS
1000.0000 ug | ORAL_TABLET | Freq: Every day | ORAL | Status: DC
  Administered 2021-06-21 – 2021-07-08 (×16): 1000 ug
  Filled 2021-06-20 (×17): qty 1

## 2021-06-20 MED ORDER — CHLORHEXIDINE GLUCONATE CLOTH 2 % EX PADS
6.0000 | MEDICATED_PAD | Freq: Every day | CUTANEOUS | Status: DC
  Administered 2021-06-20: 6 via TOPICAL

## 2021-06-20 MED ORDER — LACTATED RINGERS IV SOLN: INTRAVENOUS | Status: AC

## 2021-06-20 MED ORDER — IOHEXOL 350 MG/ML SOLN
100.0000 mL | Freq: Once | INTRAVENOUS | Status: AC | PRN
  Administered 2021-06-20: 100 mL via INTRAVENOUS

## 2021-06-20 NOTE — Assessment & Plan Note (Addendum)
Currently weaning on mechanical ventilation  -Daily SBT -Mental status likely to dictate extubation

## 2021-06-20 NOTE — Assessment & Plan Note (Deleted)
Source unclear. Echocardiogram shows normal LV RV function.  Vegetations on both anterior and posterior mitral leaflets with 2 eccentric regurgitation jets.  -Requires 6 weeks nafcillin. -Currently not suitable for operative repair.  Not in acute heart failure. -Allow patient to recover from stroke and reassess necessity for surgical intervention at that time.

## 2021-06-20 NOTE — Assessment & Plan Note (Signed)
Due to Staphylococcus aureus bacteremia. Does not appear in shock at this time.

## 2021-06-20 NOTE — Progress Notes (Signed)
Pt transported to CT scan and back to room 1230 in ICU.  Pt remained stable and comfortable throughout trip.  Suctioning and oral care done prior to trip,

## 2021-06-20 NOTE — Procedures (Addendum)
Patient Name: Debra Klein  MRN: [id number]  Epilepsy Attending: Lora Havens  Referring Physician/Provider: Lorenza Chick, MD  Duration: 06/20/2021 [phone] to 1558   Patient history:  46 year old woman with past medical history of schizophrenia, hypertension, hyperlipidemia, presenting with fever, altered mental status, and found to have multifocal strokes, with an acute change in mental status.  EEG to evaluate for seizure.   Level of alertness: lethargic    AEDs during EEG study: None   Technical aspects: This EEG study was done with scalp electrodes positioned according to the 10-20 International system of electrode placement. Electrical activity was acquired at a sampling rate of 500Hz  and reviewed with a high frequency filter of 70Hz  and a low frequency filter of 1Hz . EEG data were recorded continuously and digitally stored.    Description: EEG showed continuous generalized predominantly 5 to 7 Hz theta slowing admixed with intermittent generalized 2 to 3 Hz delta slowing.  Hyperventilation and photic stimulation were not performed.      ABNORMALITY - Continuous slow, generalized   IMPRESSION: This study is suggestive of moderate diffuse encephalopathy, nonspecific etiology. No seizures or epileptiform discharges were seen throughout the recording.   Debra Klein

## 2021-06-20 NOTE — Procedures (Addendum)
Patient Name: Debra Klein  MRN: [id number]  Epilepsy Attending: Lora Havens  Referring Physician/Provider: Julian Hy, DO  Date: 06/19/2021 Duration: 22.59 mins  Patient history:  46 year old woman with past medical history of schizophrenia, hypertension, hyperlipidemia, presenting with fever, altered mental status, and found to have multifocal strokes, with an acute change in mental status.  EEG to evaluate for seizure.  Level of alertness: lethargic   AEDs during EEG study: None  Technical aspects: This EEG study was done with scalp electrodes positioned according to the 10-20 International system of electrode placement. Electrical activity was acquired at a sampling rate of 500Hz  and reviewed with a high frequency filter of 70Hz  and a low frequency filter of 1Hz . EEG data were recorded continuously and digitally stored.   Description: EEG showed continuous generalized 3 to 6 Hz theta-delta slowing. Hyperventilation and photic stimulation were not performed.     ABNORMALITY - Continuous slow, generalized  IMPRESSION: This study is suggestive of moderate to severe diffuse encephalopathy, nonspecific etiology. No seizures or epileptiform discharges were seen throughout the recording.  [name] [name]

## 2021-06-20 NOTE — Progress Notes (Signed)
SLP Cancellation Note  Patient Details Name: Debra Klein MRN: [id number] DOB: 08-Feb-1975   Cancelled treatment:       Reason Eval/Treat Not Completed: Patient not medically ready (Pt is currently on the vent. SLP will follow up.)  Cheng Dec I. Hardin Negus, [place], [place] Office number [phone] Pager [place] 06/20/2021, 8:47 AM

## 2021-06-20 NOTE — Assessment & Plan Note (Deleted)
Remains globally encephalopathic likely secondary to sepsis as well as MCA stroke.  -Keep off sedation and allow sensorium to clear. -Reconsider LP if mental status does not improve.

## 2021-06-20 NOTE — Assessment & Plan Note (Signed)
Worsening creatinine in face of bacteremia.  Likely metastatic renal infections from Staphylococcus aureus.  -Follow creatinine

## 2021-06-20 NOTE — Progress Notes (Signed)
Initial Nutrition Assessment  DOCUMENTATION CODES:   Not applicable  INTERVENTION:   D/C Vital High Protein  Initiate tube feeding via OG tube: Osmolite 1.5 at 55 ml/h (1320 ml per day) Prosource TF 45 ml TID  Provides 2100 kcal, 115 gm protein, 1003 ml free water daily   NUTRITION DIAGNOSIS:   Increased nutrient needs related to  (endocarditis) as evidenced by estimated needs.  GOAL:   Patient will meet greater than or equal to 90% of their needs  MONITOR:   TF tolerance  REASON FOR ASSESSMENT:   Consult Enteral/tube feeding initiation and management  ASSESSMENT:   Pt with PMH of schizophrenia, HTN, and anemia admitted from home where she lives with her mother with embolic CVA and MSSA bacteremia due to suspected endocarditis.   Pt discussed during ICU rounds and with RN and MD.  TEE pending.  No family present. Per chart review pt had not been feeling well, eating less, not responding to questions at times for about a week PTA. She had a fall with knee injury received a cortisone shot 2 weeks ago. She also had a dental filling 1 week ago.    Medications reviewed and include: vitamin B12 1000 mcg daily, colace, SSI, protonix, miralax  Labs reviewed: K 3.4, PO4: 1.9 (30 mmol KPhos x 1), Vitamin B12: 245   24 F OG tube: tip in gastric lumen per xray  Current TF: Vital High Protein @ 40 ml/hr with 45 ml ProSource TF BID  NUTRITION - FOCUSED PHYSICAL EXAM:  Flowsheet Row Most Recent Value  Orbital Region No depletion  Upper Arm Region No depletion  Thoracic and Lumbar Region No depletion  Buccal Region Unable to assess  Temple Region Mild depletion  Clavicle Bone Region Moderate depletion  Clavicle and Acromion Bone Region Moderate depletion  Scapular Bone Region Unable to assess  Dorsal Hand No depletion  Patellar Region No depletion  Anterior Thigh Region No depletion  Posterior Calf Region No depletion  Edema (RD Assessment) None  Hair Reviewed  Eyes  Reviewed  Mouth Unable to assess  Skin Reviewed  Nails Reviewed       Diet Order:   Diet Order             Diet NPO time specified  Diet effective now                   EDUCATION NEEDS:   No education needs have been identified at this time  Skin:  Skin Assessment:  (knee wound from recent fall)  Last BM:  unknown  Height:   Ht Readings from Last 1 Encounters:  06/19/21 5\' 6"  (1.676 m)    Weight:   Wt Readings from Last 1 Encounters:  06/20/21 83.9 kg   BMI:  Body mass index is 29.85 kg/m.  Estimated Nutritional Needs:   Kcal:  2000-2200  Protein:  115-130 grams  Fluid:  >2L/day  Lockie Pares., RD, LDN, CNSC See [facility] for contact information

## 2021-06-20 NOTE — Progress Notes (Signed)
  Echocardiogram 2D Echocardiogram has been performed.  Debra Klein 06/20/2021, 9:29 AM

## 2021-06-20 NOTE — Progress Notes (Signed)
ABG reviewed. RR decreased to 14, FiO2 decreased to 30%.  MRI confirmed large L MCA CVA. No shift. Neuro updated, will see when she gets to [facility].  Julian Hy, DO 06/20/21 12:53 AM [facility]

## 2021-06-20 NOTE — Assessment & Plan Note (Signed)
-  Consider restarting home medications as mental status improves.

## 2021-06-20 NOTE — Progress Notes (Signed)
vLTM discontinued.  [facility] notified.  No skin breakdown noted at all skin sites. ?

## 2021-06-20 NOTE — TOC CAGE-AID Note (Signed)
Transition of Care [facility]) - CAGE-AID Screening   Patient Details  Name: Debra Klein MRN: [id number] Date of Birth: [birth year]  [facility] CM/SW Contact:    Kevin Space C Tarpley-Carter, [place] Phone Number: 06/20/2021, 2:53 PM   Clinical Narrative: Pt is unable to participate in Cage Aid. Pt is intubated with AMS.  Kayshaun Polanco Tarpley-Carter, MSW, LCSW-A Pronouns:  She/Her/Hers [facility] Clinical Social Worker Direct Number:  [phone] [email or web address]  CAGE-AID Screening: Substance Abuse Screening unable to be completed due to: : Patient unable to participate

## 2021-06-20 NOTE — Assessment & Plan Note (Signed)
CVA secondary to septic embolization from Staph aureus bacteremia. L MCA stroke on MRI but not moving left side.   -Outside window for cerebrovascular intervention. -Repeat imaging if doesn't wake up further and start to move left side.  -Likely to require stroke rehabilitation.

## 2021-06-20 NOTE — Progress Notes (Signed)
PT Cancellation Note  Patient Details Name: Debra Klein MRN: [id number] DOB: Mar 12, 1975   Cancelled Treatment:    Reason Eval/Treat Not Completed: Fatigue/lethargy limiting ability to participate. Per RN pt is intubated, off sedation, does not arouse to stimulation, unable to actively participate in PT at this time. PT will follow up tomorrow.   Zenaida Niece 06/20/2021, 4:38 PM

## 2021-06-20 NOTE — Progress Notes (Signed)
EEG complete - results pending 

## 2021-06-20 NOTE — Progress Notes (Addendum)
STROKE TEAM PROGRESS NOTE   INTERVAL HISTORY Patient is seen in her room with no family at the bedside.  She became nonverbal on 6/6 and on 6/8 developed acute onset left gaze deviation and decrease in mental status.  She was then intubated for airway protection.  CT head was concerning for left sided stroke, and CTA revealed left MCA M2 branch occlusion.  MRI brain reveals large left MCA territory infarct and scattered acute infarcts in right hemisphere and left occipital lobe.  She has MSSA bacteremia and ID suspects bacterial endocarditis 2D echo is pending.  EEG shows diffuse encephalopathy.  No seizure activity. Vitals:   06/20/21 0838 06/20/21 0900 06/20/21 1000 06/20/21 1110  BP:  112/84 119/85   Pulse:  86 86 87  Resp:  13 15 15   Temp:      TempSrc:      SpO2: 100% 100% 100% 100%  Weight:      Height:       CBC:  Recent Labs  Lab 06/19/21 1341 06/20/21 1003  WBC 15.2* 13.6*  NEUTROABS 14.3* 12.3*  HGB 8.6* 8.5*  HCT 27.1* 25.9*  MCV 78.3* 77.1*  PLT 196 161*   Basic Metabolic Panel:  Recent Labs  Lab 06/19/21 1820 06/20/21 0015 06/20/21 0301  NA  --  136 135  K  --  3.1* 3.4*  CL  --  99 102  CO2  --  26 25  GLUCOSE  --  110* 126*  BUN  --  27* 28*  CREATININE  --  1.19* 1.09*  CALCIUM  --  8.4* 7.9*  MG 1.8  --  1.8  PHOS  --   --  1.9*   Lipid Panel:  Recent Labs  Lab 06/20/21 0200 06/20/21 0301  CHOL 161  --   TRIG 522* 479*  HDL <10*  --   CHOLHDL  UNABLE TO CALCULATE  --   VLDL UNABLE TO CALCULATE  --   LDLCALC UNABLE TO CALCULATE IF TRIGLYCERIDE OVER 400 mg/dL  --    HgbA1c:  Recent Labs  Lab 06/20/21 0015  HGBA1C 6.5*   Urine Drug Screen:  Recent Labs  Lab 06/19/21 1330  LABOPIA NONE DETECTED  COCAINSCRNUR NONE DETECTED  LABBENZ NONE DETECTED  AMPHETMU NONE DETECTED  THCU NONE DETECTED  LABBARB NONE DETECTED    Alcohol Level  Recent Labs  Lab 06/19/21 1341  ETH <10    IMAGING past 24 hours Overnight EEG with  video  Result Date: 06/20/2021 Lora Havens, MD     06/20/2021 10:00 AM Patient Name: Debra Klein MRN: [id number] Epilepsy Attending: Lora Havens Referring Physician/Provider: Lorenza Chick, MD Duration: 06/20/2021 [phone] to 1000  Patient history:  46 year old woman with past medical history of schizophrenia, hypertension, hyperlipidemia, presenting with fever, altered mental status, and found to have multifocal strokes, with an acute change in mental status.  EEG to evaluate for seizure.  Level of alertness: lethargic  AEDs during EEG study: None  Technical aspects: This EEG study was done with scalp electrodes positioned according to the 10-20 International system of electrode placement. Electrical activity was acquired at a sampling rate of 500Hz  and reviewed with a high frequency filter of 70Hz  and a low frequency filter of 1Hz . EEG data were recorded continuously and digitally stored.  Description: EEG showed continuous generalized predominantly 5 to 7 Hz theta slowing admixed with intermittent generalized 2 to 3 Hz delta slowing.  Hyperventilation and photic stimulation were not performed.  ABNORMALITY - Continuous slow, generalized  IMPRESSION: This study is suggestive of moderate diffuse encephalopathy, nonspecific etiology. No seizures or epileptiform discharges were seen throughout the recording.  Lora Havens   EEG adult  Result Date: 06/20/2021 Lora Havens, MD     06/20/2021  9:56 AM Patient Name: Debra Klein MRN: [id number] Epilepsy Attending: Lora Havens Referring Physician/Provider: Julian Hy, DO Date: 06/19/2021 Duration: 22.59 mins Patient history:  46 year old woman with past medical history of schizophrenia, hypertension, hyperlipidemia, presenting with fever, altered mental status, and found to have multifocal strokes, with an acute change in mental status.  EEG to evaluate for seizure. Level of alertness: lethargic AEDs during EEG study: None Technical aspects:  This EEG study was done with scalp electrodes positioned according to the 10-20 International system of electrode placement. Electrical activity was acquired at a sampling rate of 500Hz  and reviewed with a high frequency filter of 70Hz  and a low frequency filter of 1Hz . EEG data were recorded continuously and digitally stored. Description: EEG showed continuous generalized 3 to 6 Hz theta-delta slowing. Hyperventilation and photic stimulation were not performed.   ABNORMALITY - Continuous slow, generalized IMPRESSION: This study is suggestive of moderate to severe diffuse encephalopathy, nonspecific etiology. No seizures or epileptiform discharges were seen throughout the recording. Priyanka [name]   CT ANGIO HEAD NECK W WO CM W PERF (CODE STROKE)  Result Date: 06/20/2021 CLINICAL DATA:  Altered mental status. EXAM: CT ANGIOGRAPHY HEAD AND NECK CT PERFUSION BRAIN TECHNIQUE: Multidetector CT imaging of the head and neck was performed using the standard protocol during bolus administration of intravenous contrast. Multiplanar CT image reconstructions and MIPs were obtained to evaluate the vascular anatomy. Carotid stenosis measurements (when applicable) are obtained utilizing NASCET criteria, using the distal internal carotid diameter as the denominator. Multiphase CT imaging of the brain was performed following IV bolus contrast injection. Subsequent parametric perfusion maps were calculated using RAPID software. RADIATION DOSE REDUCTION: This exam was performed according to the departmental dose-optimization program which includes automated exposure control, adjustment of the mA and/or kV according to patient size and/or use of iterative reconstruction technique. CONTRAST:  133mL OMNIPAQUE IOHEXOL 350 MG/ML SOLN COMPARISON:  None Available. FINDINGS: CTA NECK FINDINGS SKELETON: There is no bony spinal canal stenosis. No lytic or blastic lesion. OTHER NECK: Normal pharynx, larynx and major salivary glands. No  cervical lymphadenopathy. Unremarkable thyroid gland. UPPER CHEST: No pneumothorax or pleural effusion. No nodules or masses. AORTIC ARCH: There is no calcific atherosclerosis of the aortic arch. There is no aneurysm, dissection or hemodynamically significant stenosis of the visualized portion of the aorta. Conventional 3 vessel aortic branching pattern. The visualized proximal subclavian arteries are widely patent. RIGHT CAROTID SYSTEM: No dissection, occlusion or aneurysm. Minimal atherosclerotic calcification at the carotid bifurcation without hemodynamically significant stenosis. LEFT CAROTID SYSTEM: Normal without aneurysm, dissection or stenosis. VERTEBRAL ARTERIES: Left dominant configuration. Both origins are clearly patent. There is no dissection, occlusion or flow-limiting stenosis to the skull base (V1-V3 segments). CTA HEAD FINDINGS POSTERIOR CIRCULATION: --Vertebral arteries: Normal V4 segments. --Inferior cerebellar arteries: Normal. --Basilar artery: Normal. --Superior cerebellar arteries: Normal. --Posterior cerebral arteries (PCA): Normal. ANTERIOR CIRCULATION: --Intracranial internal carotid arteries: Normal. --Anterior cerebral arteries (ACA): Normal. Both A1 segments are present. Patent anterior communicating artery (a-comm). --Middle cerebral arteries (MCA): There is occlusion of a left MCA M2 branch in the insula (series 8, image 107). The M1 segment is patent without stenosis. Right MCA is normal. VENOUS SINUSES: As permitted by  contrast timing, patent. ANATOMIC VARIANTS: None Review of the MIP images confirms the above findings. CT Brain Perfusion Findings: ASPECTS: 8 at 9:40 p.m. on 06/19/2021 CBF (<30%) Volume: 28mL Perfusion (Tmax>6.0s) volume: 82mL Mismatch Volume: 46mL Infarction Location:No infarction by CT perfusion analysis. However, this is likely artifactual, because there is completed infarction demonstrated on the noncontrast CT and the MRI, which often confounds perfusion data.  IMPRESSION: 1. Occlusion of a left MCA M2 branch in the insula. No other intracranial arterial occlusion or high-grade stenosis. 2. CT perfusion does not show a significant region of penumbra. 3. No embolic source identified within the neck. Echocardiogram may be helpful to assess for potential cardiac embolic source. Electronically Signed   By: Ulyses Jarred M.D.   On: 06/20/2021 03:04   MR BRAIN WO CONTRAST  Result Date: 06/20/2021 CLINICAL DATA:  Acute neurologic deficit EXAM: MRI HEAD WITHOUT CONTRAST TECHNIQUE: Multiplanar, multiecho pulse sequences of the brain and surrounding structures were obtained without intravenous contrast. COMPARISON:  None Available. FINDINGS: Brain: There is multifocal acute ischemia, predominantly a left MCA territory large cortical infarct. There are scattered punctate foci of acute ischemia within the right hemisphere and in the left occipital lobe. No acute or chronic hemorrhage. There is multifocal hyperintense T2-weighted signal within the white matter. Parenchymal volume and CSF spaces are normal. The midline structures are normal. Vascular: Major flow voids are preserved. Skull and upper cervical spine: Normal calvarium and skull base. Visualized upper cervical spine and soft tissues are normal. Sinuses/Orbits:No paranasal sinus fluid levels or advanced mucosal thickening. No mastoid or middle ear effusion. Normal orbits. IMPRESSION: 1. Large left MCA territory cortical infarct. No hemorrhage or mass effect. 2. Scattered punctate foci of acute ischemia within the right hemisphere and left occipital lobe. Electronically Signed   By: Ulyses Jarred M.D.   On: 06/20/2021 00:10   CT Head Wo Contrast  Result Date: 06/19/2021 CLINICAL DATA:  Altered mental status EXAM: CT HEAD WITHOUT CONTRAST TECHNIQUE: Contiguous axial images were obtained from the base of the skull through the vertex without intravenous contrast. RADIATION DOSE REDUCTION: This exam was performed according  to the departmental dose-optimization program which includes automated exposure control, adjustment of the mA and/or kV according to patient size and/or use of iterative reconstruction technique. COMPARISON:  Head CT 06/19/2021 FINDINGS: Brain: There is no evidence of acute intracranial hemorrhage. There is loss of gray-white matter differentiation along the left insula (series 2, images 12-15).The basal cisterns are patent.The ventricles are normal in size. Vascular: No hyperdense vessel or unexpected calcification. Skull: Normal. Negative for fracture or focal lesion. Sinuses/Orbits: There is frothy material in the nasal cavities. Mild ethmoid air cell mucosal thickening. Other: None. IMPRESSION: Loss of gray-white matter differentiation along the left insula, concerning for infarction. Recommend CTA head/neck and/or brain MRI. Critical Value/emergent results were called by telephone at the time of interpretation on 06/19/2021 at 10:07 pm to provider Carmin Muskrat , who verbally acknowledged these results. Electronically Signed   By: Maurine Simmering M.D.   On: 06/19/2021 22:09   DG Chest Portable 1 View  Result Date: 06/19/2021 CLINICAL DATA:  post intubation; OG tube placement EXAM: PORTABLE CHEST 1 VIEW COMPARISON:  None Available. FINDINGS: Endotracheal tube approximately 3-4 cm above the carina. Enteric tube coursing below the hemidiaphragm with tip and side port overlying the expected region of the gastric lumen. Lines and tubes overlie the abdomen. The heart and mediastinal contours are within normal limits. No focal consolidation. No pulmonary edema. No  pleural effusion. No pneumothorax. Nonobstructive bowel gas pattern. Stool noted throughout the visualized colon. No acute osseous abnormality. IMPRESSION: 1. No active cardiopulmonary disease. 2. Nonobstructive bowel gas pattern. 3. Lines and tubes in good position. Electronically Signed   By: Iven Finn M.D.   On: 06/19/2021 21:29   DG Abdomen 1  View  Result Date: 06/19/2021 CLINICAL DATA:  post intubation; OG tube placement EXAM: PORTABLE CHEST 1 VIEW COMPARISON:  None Available. FINDINGS: Endotracheal tube approximately 3-4 cm above the carina. Enteric tube coursing below the hemidiaphragm with tip and side port overlying the expected region of the gastric lumen. Lines and tubes overlie the abdomen. The heart and mediastinal contours are within normal limits. No focal consolidation. No pulmonary edema. No pleural effusion. No pneumothorax. Nonobstructive bowel gas pattern. Stool noted throughout the visualized colon. No acute osseous abnormality. IMPRESSION: 1. No active cardiopulmonary disease. 2. Nonobstructive bowel gas pattern. 3. Lines and tubes in good position. Electronically Signed   By: Iven Finn M.D.   On: 06/19/2021 21:29   US Abdomen Limited RUQ (LIVER/GB)  Result Date: 06/19/2021 CLINICAL DATA:  [id number] elevated LFTs EXAM: ULTRASOUND ABDOMEN LIMITED RIGHT UPPER QUADRANT COMPARISON:  None Available. FINDINGS: Gallbladder: No gallstones or wall thickening visualized. Gallbladder wall measures 2.7 mm without pericholecystic fluid. No sonographic Murphy sign noted by sonographer. Common bile duct: Diameter: 2.7 mm Liver: No focal lesion identified. Within normal limits in parenchymal echogenicity. Portal vein is patent on color Doppler imaging with normal direction of blood flow towards the liver. Other: None. IMPRESSION: Unremarkable limited ultrasound imaging of the right upper quadrant of the abdomen. Electronically Signed   By: Frazier Richards M.D.   On: 06/19/2021 16:05   CT Cervical Spine Wo Contrast  Result Date: 06/19/2021 CLINICAL DATA:  Neck trauma, intoxicated or obtunded (Age >= 16y) EXAM: CT CERVICAL SPINE WITHOUT CONTRAST TECHNIQUE: Multidetector CT imaging of the cervical spine was performed without intravenous contrast. Multiplanar CT image reconstructions were also generated. RADIATION DOSE REDUCTION: This exam was  performed according to the departmental dose-optimization program which includes automated exposure control, adjustment of the mA and/or kV according to patient size and/or use of iterative reconstruction technique. COMPARISON:  None Available. FINDINGS: Alignment: Preserved. Skull base and vertebrae: Vertebral body heights are maintained. No acute fracture. Soft tissues and spinal canal: No prevertebral fluid or swelling. No visible canal hematoma. Disc levels:  Intervertebral disc heights are maintained. Upper chest: Included lung apices are clear. Other: Trace calcified plaque at the right common carotid bifurcation. IMPRESSION: No acute fracture. Electronically Signed   By: Macy Mis M.D.   On: 06/19/2021 14:59   CT Head Wo Contrast  Result Date: 06/19/2021 CLINICAL DATA:  Mental status change, unknown cause EXAM: CT HEAD WITHOUT CONTRAST TECHNIQUE: Contiguous axial images were obtained from the base of the skull through the vertex without intravenous contrast. RADIATION DOSE REDUCTION: This exam was performed according to the departmental dose-optimization program which includes automated exposure control, adjustment of the mA and/or kV according to patient size and/or use of iterative reconstruction technique. COMPARISON:  None Available. FINDINGS: Brain: There is no acute intracranial hemorrhage, mass effect, or edema. Gray-white differentiation is preserved. There is no extra-axial fluid collection. Ventricles and sulci are within normal limits in size and configuration. Vascular: No hyperdense vessel or unexpected calcification. Skull: Calvarium is unremarkable. Sinuses/Orbits: No acute finding. Other: None. IMPRESSION: No acute intracranial abnormality. Electronically Signed   By: Macy Mis M.D.   On: 06/19/2021 14:57  DG Chest Port 1 View  Result Date: 06/19/2021 CLINICAL DATA:  Altered mental status.  Schizophrenia. EXAM: PORTABLE CHEST 1 VIEW COMPARISON:  06/25/2007 FINDINGS: The heart  size and mediastinal contours are within normal limits. Both lungs are clear. The visualized skeletal structures are unremarkable. IMPRESSION: No active disease. Electronically Signed   By: Nelson Chimes M.D.   On: 06/19/2021 14:19    PHYSICAL EXAM General:  Intubated, well-nourished, well-developed patient in no acute distress Respiratory:  Respirations synchronous with ventilator Neurological (sedated with fentanyl):  Pupils equal and reactive, positive corneal, cough and gag reflexes, no response to noxious stimuli in all 4 extremities.  Plantars of both mute  ASSESSMENT/PLAN [name] is a 46 y.o. female with history of schizophrenia, HTN and HLD presenting with AMS, fever and acute onset left gaze deviation.  She may have had seizure activity at this time, cEEG ongoing.  She was intubated for airway protection.  CT head was concerning for left sided stroke, and CTA revealed left MCA M2 branch occlusion.  MRI brain reveals large left MCA territory infarct and scattered acute infarcts in right hemisphere and left occipital lobe.  Stroke: Left MCA infarct and small scattered acute infarcts likely secondary due to embolism from cryptogenic source though strong suspicion for bacterial endocarditis CT head Loss of gray-white matter differentiation along left insula CTA head & neck occlusion of left MCA M2 branch in insula.  No other intracranial arterial occlusion or high grade stenosis MRI  large left MCA territory cortical infarct, scattered punctate foci of acute ischemia in right hemisphere and left occipital lobe 2D Echo pending LDL UNABLE TO CALCULATE IF TRIGLYCERIDE OVER 400 mg/dL HgbA1c 6.5 VTE prophylaxis - SQH    Diet   Diet NPO time specified   No antithrombotic prior to admission, now on aspirin 81 mg daily.  Therapy recommendations:  pending Disposition:  pending  Possible seizures Patient had possible seizure on 6/8 with new onset left gaze deviation and altered mental  status Continue cEEG  Hypertension Home meds:  none Stable Permissive hypertension (OK if < 220/120) but gradually normalize in 5-7 days Long-term BP goal normotensive  Hyperlipidemia Home meds:  atorvastatin 20 mg daily, increased to 40 mg daily LDL UNABLE TO CALCULATE IF TRIGLYCERIDE OVER 400 mg/dL, goal < 70 Continue statin at discharge  Diabetes type II Controlled Home meds:  none HgbA1c 6.5, goal < 7.0 CBGs Recent Labs    06/20/21 0357 06/20/21 0830 06/20/21 1202  GLUCAP 137* 139* 145*    SSI  Schizophrenia Patient has history of schizophrenia Had stopped taking medications about a week prior to admission and had become nonverbal Consult psychiatry when patient extubated  Respiratory Failure Patient was intubated for airway protection Ventilator management per CCM  Other Stroke Risk Factors none  Other Active Problems none  Hospital day # [place] , MSN, AGACNP-BC [facility] See Amion for schedule and pager information 06/20/2021 1:26 PM    STROKE MD NOTE :  I have personally obtained history,examined this patient, reviewed notes, independently viewed imaging studies, participated in medical decision making and plan of care.ROS completed by me personally and pertinent positives fully documented  I have made any additions or clarifications directly to the above note. Agree with note above.  Patient presented with altered mental status in the setting of MSSA bacteremia with suspicion for endocarditis.  MRI scan shows left MCA inferior infarct with CT angio showing left M2 occlusion.  Etiology  of the infarct is likely embolic from endocarditis is also possible.  Recommend continue ongoing cardiac monitoring.Check echocardiogram results.  She may need TEE unless 2D echo shows a clear vegetation.  Continue aspirin for now.  Continue antibiotics as per ID team.  Discussed with Dr. Lynetta Mare critical care medicine.This patient is critically  ill and at significant risk of neurological worsening, death and care requires constant monitoring of vital signs, hemodynamics,respiratory and cardiac monitoring, extensive review of multiple databases, frequent neurological assessment, discussion with family, other specialists and medical decision making of high complexity.I have made any additions or clarifications directly to the above note.This critical care time does not reflect procedure time, or teaching time or supervisory time of PA/NP/Med Resident etc but could involve care discussion time.  I spent 30 minutes of neurocritical care time  in the care of  this patient.      Antony Contras, MD Medical Director [place] Pager: [phone] 06/20/2021 3:14 PM   To contact Stroke Continuity provider, please refer to [email or web address] After hours, contact General Neurology

## 2021-06-20 NOTE — Assessment & Plan Note (Signed)
Remains globally encephalopathic likely secondary to sepsis as well as MCA stroke.  -Keep off sedation and allow sensorium to clear. -Reconsider LP if mental status does not improve.

## 2021-06-20 NOTE — Progress Notes (Signed)
NAME:  Debra Klein, MRN:  [id number], DOB:  May 23, 1975, LOS: 1 ADMISSION DATE:  06/19/2021, CONSULTATION DATE:  06/19/21 REFERRING MD:  Laurena Spies, CHIEF COMPLAINT:  respiratory failure   History of Present Illness:  Debra Klein is a 46 year old woman with a history of schizophrenia who presented to the hospital today with acute encephalopathy. History obtained through chart review:   Family reports that about 3 weeks ago the patient started to be less verbal, but was still managing her medications. They were noticing for the last few days they would find a pill or 2 dropped on the floor although she was still overall taking her medications.  Her verbal output was actually improving in the last week or so, but then acutely today she was completely nonverbal and so they came to the ED for further evaluation.  They report she did not have any other complaints and did not notice any fevers or any physical issues other than left leg swelling and pain   Per chart review, she stopped taking her medications approximately 1 week ago, had a fall on 06/15/2021 with left ankle swelling, became nonverbal on 6/6, and subsequently had another fall on 6/7 found to have a left lower leg fracture and discharged with a cam walker boot after radiographs of her left ankle and tibia/fibula revealed previous intramedullary rodding and rod removal of the left tibia without clear evidence of acute fracture.  Subsequently she fell again on 6/8 and was found to be febrile (101.2), tachycardic to 110s, noted to be nonverbal, not following commands, frequently dozing off but withdrawing in all 4 extremities without a facial droop.  She was started on vancomycin, ceftriaxone, acyclovir for meningitis coverage.  Additionally, per hospitalist admission note LP was attempted but unsuccessful at bedside.  Subsequently at approximately 9 PM the patient had a change in mental status (drooling) with gaze deviation to the left and was  intubated. PCCM consulted. Head CT demonstrating an insular hypodensity concerning for MCA infarct.   Pertinent  Medical History  Schizophrenia HTN anemia  Significant Hospital Events: Including procedures, antibiotic start and stop dates in addition to other pertinent events   6/7: Fall --> fibula fracture, Minimally verbal at an outpatient visit with [facility]  6/8: Presented to the ED nonverbal with altered mentation. Progressively worsening mentation with requirement of intubation for airway protection.  Empirically treated for sepsis, presumed meningitis. MRI with large MCA infarct and punctate right hemisphere infarcts.  6/9: Blood cultures obtained on arrival positive for MSSA   Interim History / Subjective:   Febrile overnight up to 100.9.   EEG placed overnight. Results pending.  Triglycerides markedly increased and so Propofol discontinued.  Blood cultures positive 3/4 bottles for MSSA.   Patient is non-responsive to voice or noxious stimuli.   Objective   Blood pressure 120/79, pulse (!) 103, temperature 98.7 F (37.1 C), temperature source Oral, resp. rate 15, height 5\' 6"  (1.676 m), weight 83.9 kg, SpO2 100 %.    Vent Mode: PRVC FiO2 (%):  [30 %-40 %] 30 % Set Rate:  [14 bmp-16 bmp] 14 bmp Vt Set:  [47 mL-470 mL] 47 mL PEEP:  [5 cmH20] 5 cmH20 Plateau Pressure:  [14 cmH20-18 cmH20] 18 cmH20   Intake/Output Summary (Last 24 hours) at 06/20/2021 0756 Last data filed at 06/20/2021 0600 Gross per 24 hour  Intake 2485.87 ml  Output 725 ml  Net 1760.87 ml    Filed Weights   06/19/21 1844 06/19/21 2159 06/20/21 0457  Weight:  81.6 kg 81.9 kg 83.9 kg   Examination: General: Critically ill-appearing woman lying in bed intubated, no acute distress HENT: [place]/AT, eyes anicteric, endotracheal tube in place Lungs: Clear to auscultation throughout. No increased work of breathing.  Cardiovascular: Regular rate and rhythm. Subtle 1/6 systolic murmur heard best over LLSB using  bell.  Abdomen: Soft, nontender, active bowel sounds.  Extremities: Left lower extremity in walking boot.  No right lower extremity edema. Neuro: Unresponsive to noxious stimuli in all 4 extremities initially. On re-examination, movement of the LUE and BLE with eye opening to painful stimuli.  Pupils unequal (pinpoint right, 1 mm left), both responsive to light.  Derm: Abrasions on bilateral knees.   Resolved Hospital Problem list     Assessment & Plan:   # Sepsis 2/2 MSSA Bacteremia  # Suspected MSSA Endocarditis  Source undetermined at this time. Patient has had multiple falls with some abrasions but relatively mild. Recent knee corticoid steroid injection on the 24th of May although left knee is non-erythematous and only mildly edematous. Recent dental procedure on the 30th of May with symptoms beginning 2-3 days afterwards. No history of IVDU.  - ID consulted; appreciate their recommendations  - Narrow antibiotics to Nafcillin - Discontinue Vancomycin, Ceftriaxone and Acyclovir  - CT maxillofacial and MRI left knee per ID - TTE results pending  # Acute Encephalopathy  Most likely in the setting of bacteremia with critical illness. CVA certainly worsened her mental status, but her encephalopathy preceded the left MCA infarct. Differential also includes seizures with EEG results pending.   - Treatment of bacteremia as listed below - Discontinue plan for LP - EEG results pending   # Left Large MCA Infarct 2/2 Left MCA M2 occlusion # Scattered Right Hemisphere + Left Occiput Infarcts Secondary to bacteremia with suspect endocarditis.  - Neurology following; appreciate their recommendations - BP goal: normotension   # Acute Hypoxic Respiratory Failure  Due to acute encephalopathy.   - Continue full vent support - Not stable for SBT until neurological examination improves  - VAP bundle  # Elevated LFTs Acute hepatitis panel and RUQ ultrasound negative. Likely in the setting  of sepsis and bacteremia.  - Trend LFTs  # Hypomagnesemia  # Hypophosphatemia  - Replete PRN   # History of Hypertension - Hold home BP medications in light of sepsis   # Chronic Iron Deficiency Anemia - Monitor CBC - Obtain iron panel  - Transfuse for hemoglobin < 7  # Type 2 Diabetes Mellitus # History of Pre-Diabetes  A1c on admission minimally elevated at 6.5%.   - SSI to maintain CBG between 140 -180  # Schizophrenia  - Hold home Clozapine, Fluphenazine, Benztropine and Trazodone   Best Practice (right click and "Reselect all SmartList Selections" daily)   Diet/type: tubefeeds DVT prophylaxis: prophylactic heparin  GI prophylaxis: PPI Lines: N/A Foley:  Yes, and it is still needed Code Status:  full code Last date of multidisciplinary goals of care discussion [Mother and sister updated at bedside on 6/9]      Dr. Jose Persia Internal Medicine PGY-3  06/20/2021, 9:36 AM

## 2021-06-20 NOTE — Assessment & Plan Note (Signed)
Source unclear. Echocardiogram shows normal LV RV function.  Vegetations on both anterior and posterior mitral leaflets with 2 eccentric regurgitation jets.  -Requires 6 weeks nafcillin. -Currently not suitable for operative repair.  Not in acute heart failure. -Allow patient to recover from stroke and reassess necessity for surgical intervention at that time.

## 2021-06-20 NOTE — Consult Note (Signed)
Brief Psychiatry Consult Note  Psychiatry consulted initially by hospitalist d/t AMS in pt with refractory schizophrenia necessitating clozapine. Unfortunately found to have large L MCA territory stroke with ?endocarditis, now intubated and minimally responsive. Have discussed with primary team and will discontinue consult. Would not restart clozapine at this time (highly anticholinergic) although may be necessary as most pts on clozapine have a history of non-response to other antipsychotics. If psychiatry reconsulted this admission, please also order labs/tests below which would be necessary if restarting clozapine.  - EKG, troponins, CBC w diff, orthostatics, CRP and lipids/A1c if none recent in system   99211 - spent approx 15 min on chart review/discussing with primary team.   Debra Klein

## 2021-06-20 NOTE — Progress Notes (Signed)
[facility] Progress Note Patient Name: Debra Klein DOB: 1975-08-26 MRN: [id number]   Date of Service  06/20/2021  HPI/Events of Note  Multiple issues: 1. Triglycerides = 479 - Likely d/t Propofol IV infusion. 2. K+ = 3.4, Mg++ = 1.8, PO3--- = 1.9 and Creatinine = 1.09.  eICU Interventions  Plan: Fentanyl IV infusion. Titrate to RASS = 0 to -1. D/C Propofol IV infusion.  Will replace K+, PO4--- and Mg++.     Intervention Category Major Interventions: Other:  Lamone Ferrelli Cornelia Copa 06/20/2021, 6:25 AM

## 2021-06-20 NOTE — Progress Notes (Signed)
LTM EEG hooked up and running - no initial skin breakdown - push button tested - neuro notified. [facility] monitoring.  

## 2021-06-20 NOTE — Assessment & Plan Note (Signed)
Due to sepsis.  Improving.

## 2021-06-20 NOTE — Progress Notes (Signed)
PHARMACY - PHYSICIAN COMMUNICATION CRITICAL VALUE ALERT - BLOOD CULTURE IDENTIFICATION (BCID)  Debra Klein is an 46 y.o. female who presented to [facility] on 06/19/2021 with a chief complaint of leg pain/AMS  Assessment:  71 YOF with AMS and found to have multifocal strokes concerning for endocarditis. Now with 3 of 4 BCx growing GPC with BCID detecting MSSA.   Name of physician (or Provider) Contacted: ID consult - automatic consult, CCM informed via rounding pharmacist on 4N  Current antibiotics: Vancomycin + Acyclovir + Ceftriaxone  Changes to prescribed antibiotics recommended:  Narrow to Nafcillin 12g CI every 24 hours  Results for orders placed or performed during the hospital encounter of 06/19/21  Blood Culture ID Panel (Reflexed) (Collected: 06/19/2021  1:41 PM)  Result Value Ref Range   Enterococcus faecalis NOT DETECTED NOT DETECTED   Enterococcus Faecium NOT DETECTED NOT DETECTED   Listeria monocytogenes NOT DETECTED NOT DETECTED   Staphylococcus species DETECTED (A) NOT DETECTED   Staphylococcus aureus (BCID) DETECTED (A) NOT DETECTED   Staphylococcus epidermidis NOT DETECTED NOT DETECTED   Staphylococcus lugdunensis NOT DETECTED NOT DETECTED   Streptococcus species NOT DETECTED NOT DETECTED   Streptococcus agalactiae NOT DETECTED NOT DETECTED   Streptococcus pneumoniae NOT DETECTED NOT DETECTED   Streptococcus pyogenes NOT DETECTED NOT DETECTED   A.calcoaceticus-baumannii NOT DETECTED NOT DETECTED   Bacteroides fragilis NOT DETECTED NOT DETECTED   Enterobacterales NOT DETECTED NOT DETECTED   Enterobacter cloacae complex NOT DETECTED NOT DETECTED   Escherichia coli NOT DETECTED NOT DETECTED   Klebsiella aerogenes NOT DETECTED NOT DETECTED   Klebsiella oxytoca NOT DETECTED NOT DETECTED   Klebsiella pneumoniae NOT DETECTED NOT DETECTED   Proteus species NOT DETECTED NOT DETECTED   Salmonella species NOT DETECTED NOT DETECTED   Serratia marcescens NOT DETECTED NOT  DETECTED   Haemophilus influenzae NOT DETECTED NOT DETECTED   Neisseria meningitidis NOT DETECTED NOT DETECTED   Pseudomonas aeruginosa NOT DETECTED NOT DETECTED   Stenotrophomonas maltophilia NOT DETECTED NOT DETECTED   Candida albicans NOT DETECTED NOT DETECTED   Candida auris NOT DETECTED NOT DETECTED   Candida glabrata NOT DETECTED NOT DETECTED   Candida krusei NOT DETECTED NOT DETECTED   Candida parapsilosis NOT DETECTED NOT DETECTED   Candida tropicalis NOT DETECTED NOT DETECTED   Cryptococcus neoformans/gattii NOT DETECTED NOT DETECTED   Meth resistant mecA/C and MREJ NOT DETECTED NOT DETECTED    Thank you for allowing pharmacy to be a part of this patient's care.  Alycia Rossetti, PharmD, BCPS Infectious Diseases Clinical Pharmacist 06/20/2021 8:34 AM   **Pharmacist phone directory can now be found on [email or web address] ([email or web address]).  Listed under [place].

## 2021-06-20 NOTE — Consult Note (Addendum)
[place] for Infectious Disease    Date of Admission:  06/19/2021   Total days of inpatient antibiotics 1        Reason for Consult: MSSA bacteremia    Principal Problem:   Sepsis ([place]) Active Problems:   Schizophrenia ([place])   Transaminitis   AKI (acute kidney injury) ([place])   Hypokalemia   Bacteremia due to methicillin susceptible Staphylococcus aureus (MSSA)   Toxic encephalopathy   Cerebrovascular accident (CVA) due to embolic occlusion of left middle cerebral artery [name])   Assessment: 46 year old female with schizophrenia admitted with altered mental status.  Intubated in the ED, found to have CVA with MSSA bacteremia.  On arrival, she had temp of 101.2, WBC 15 K. #MSSA bacteremia #AMS #CVA #Possible IE #Fever #Leukocytosis #Falls -CTH on 6/8 showed loss of gray white matter alone with insula concerning for infarction -CT on head neck 6/9 showed left MCA branch in the insula. No embolic source in the eck identified -Blood Cx from admission returned + for MSSA. ID engaged -I spoke with mother(whom pt lives with) and sister. PT had not been feeling well(eating less, and not responding to questions at times) for about a week. She had a cortisone shot in left knee about 2 weeks ago. Had dental procedure(filling) one weeks ago.  Suspect MSSA was seeded by either the knee(although only mildly swollen on exam) or dental procedure. Also noted to be having falls over the last week.  -Suspect she likely has endocarditis with MSSA leading to embolic CVA. Suspect she was likely feeling weak from bacteremia and CVA resulted in AMS.  Recommendations:  -D/C acyclovir, ceftriaxone and vancomycin -Start nafcillin -Follow blood Cx, repeat blood Cx for 6/10 ordered -Follow up TTE -MRI left knee when able -CT maxillofacial  Dr. Baxter Flattery will be available over the weekend for ID related concerns.  Microbiology:   Antibiotics: Vancomycin acyclovir and ceftriaxone  6/8-p  Cultures: Blood 6/8 2/2 MSSA  HPI: Debra Klein is a 46 y.o. female with history of schizophrenia, hypertension, iron deficiency anemia presented to the hospital altered mental status noted to have start taking antipsychotics about a week ago day prior to admission.  She had left lower extremity injury, seen by orthopedics.  Noted to have not been which was also noted on orthopedic surgery's note on 6/7.  Her mentation worsened requiring intubation.  She developed AMS.  Attempted LP in the ED which was unsuccessful.  Intubated, unable to provide Hx.   Review of Systems: Review of Systems  Unable to perform ROS: Critical illness    Past Medical History:  Diagnosis Date   Hyperlipemia    Hypertension    IDA (iron deficiency anemia)    Schizophrenia ([place])        No family history on file. Scheduled Meds:  acetaminophen  650 mg Rectal Once   aspirin  81 mg Per Tube Daily   atorvastatin  40 mg Per Tube Daily   chlorhexidine gluconate (MEDLINE KIT)  15 mL Mouth Rinse BID   Chlorhexidine Gluconate Cloth  6 each Topical Daily   Chlorhexidine Gluconate Cloth  6 each Topical Q0600   cyanocobalamin  1,000 mcg Intramuscular Once   docusate  100 mg Per Tube BID   feeding supplement (PROSource TF)  45 mL Per Tube BID   feeding supplement (VITAL HIGH PROTEIN)  1,000 mL Per Tube Q24H   heparin injection (subcutaneous)  5,000 Units Subcutaneous Q8H   insulin aspart  1-3 Units Subcutaneous Q4H   mouth rinse  15 mL Mouth Rinse 10 times per day   mupirocin ointment  1 application  Nasal BID   pantoprazole sodium  40 mg Per Tube Daily   polyethylene glycol  17 g Per Tube Daily   [START ON 06/21/2021] vitamin B-12  1,000 mcg Per Tube Daily   Continuous Infusions:  0.9 % NaCl with KCl 20 mEq / L 125 mL/hr at 06/20/21 1000   fentaNYL infusion INTRAVENOUS Stopped (06/20/21 0851)   nafcillin 12 g in sodium chloride 0.9 % 500 mL continuous infusion     potassium PHOSPHATE IVPB (in mmol)  85 mL/hr at 06/20/21 1000   PRN Meds:.acetaminophen, fentaNYL (SUBLIMAZE) injection, fentaNYL (SUBLIMAZE) injection, midazolam, midazolam, white petrolatum No Known Allergies  OBJECTIVE: Blood pressure 119/85, pulse 87, temperature 98.1 F (36.7 C), temperature source Axillary, resp. rate 15, height $RemoveBe'5\' 6"'DjFAhFEPT$  (1.676 m), weight 83.9 kg, SpO2 100 %.  Physical Exam Constitutional:      Comments: Intubated. Does not respond to pain.   HENT:     Head: Normocephalic and atraumatic.     Right Ear: Tympanic membrane normal.     Left Ear: Tympanic membrane normal.     Nose: Nose normal.     Mouth/Throat:     Mouth: Mucous membranes are moist.  Eyes:     Extraocular Movements: Extraocular movements intact.     Conjunctiva/sclera: Conjunctivae normal.     Pupils: Pupils are equal, round, and reactive to light.  Cardiovascular:     Rate and Rhythm: Normal rate and regular rhythm.     Heart sounds: No murmur heard.    No friction rub. No gallop.  Pulmonary:     Effort: Pulmonary effort is normal.     Breath sounds: Normal breath sounds.  Abdominal:     General: Abdomen is flat.     Palpations: Abdomen is soft.  Skin:    General: Skin is warm and dry.  Psychiatric:        Mood and Affect: Mood normal.     Lab Results Lab Results  Component Value Date   WBC 15.2 (H) 06/19/2021   HGB 8.6 (L) 06/19/2021   HCT 27.1 (L) 06/19/2021   MCV 78.3 (L) 06/19/2021   PLT 196 06/19/2021    Lab Results  Component Value Date   CREATININE 1.09 (H) 06/20/2021   BUN 28 (H) 06/20/2021   NA 135 06/20/2021   K 3.4 (L) 06/20/2021   CL 102 06/20/2021   CO2 25 06/20/2021    Lab Results  Component Value Date   ALT 77 (H) 06/19/2021   AST 98 (H) 06/19/2021   ALKPHOS 156 (H) 06/19/2021   BILITOT 1.4 (H) 06/19/2021       Laurice Record, [place] for Infectious Disease [facility] 06/20/2021, 11:21 AM

## 2021-06-20 NOTE — Progress Notes (Signed)
1449: RN called EEG to notify of MRI scheduled for 1530 and need for lead removal prior to then.   1542: EEG still not at bedside, RN called EEG again to ask when leads would be removed.   1550: RN discussed plan with MRI tech Trilby Drummer to reschedule MRI for tonight at midnight due to delay. Dr. Charleen Kirks notified of MRI being rescheduled, per Dr. Charleen Kirks okay for CT to be completed tonight alongside MRI.

## 2021-06-20 NOTE — Progress Notes (Signed)
[place] Progress Note Patient Name: Debra Klein DOB: [birth year] MRN: [id number]   Date of Service  06/20/2021  HPI/Events of Note  Hypokalemia - K+ = 3.1 and Creatinine = 1.19.  eICU Interventions  Will replace K+.     Intervention Category Major Interventions: Electrolyte abnormality - evaluation and management  Helmer Dull [place] 06/20/2021, 1:50 AM

## 2021-06-21 ENCOUNTER — Inpatient Hospital Stay (HOSPITAL_COMMUNITY): Payer: Medicare Other

## 2021-06-21 DIAGNOSIS — J9601 Acute respiratory failure with hypoxia: Secondary | ICD-10-CM | POA: Diagnosis not present

## 2021-06-21 DIAGNOSIS — A4101 Sepsis due to Methicillin susceptible Staphylococcus aureus: Secondary | ICD-10-CM | POA: Diagnosis not present

## 2021-06-21 DIAGNOSIS — M009 Pyogenic arthritis, unspecified: Secondary | ICD-10-CM

## 2021-06-21 DIAGNOSIS — G9341 Metabolic encephalopathy: Secondary | ICD-10-CM | POA: Diagnosis not present

## 2021-06-21 DIAGNOSIS — R6520 Severe sepsis without septic shock: Secondary | ICD-10-CM | POA: Diagnosis not present

## 2021-06-21 DIAGNOSIS — A419 Sepsis, unspecified organism: Secondary | ICD-10-CM | POA: Diagnosis not present

## 2021-06-21 LAB — CBC WITH DIFFERENTIAL/PLATELET
Abs Immature Granulocytes: 0.26 10*3/uL — ABNORMAL HIGH (ref 0.00–0.07)
Basophils Absolute: 0 10*3/uL (ref 0.0–0.1)
Basophils Relative: 0 %
Eosinophils Absolute: 0 10*3/uL (ref 0.0–0.5)
Eosinophils Relative: 0 %
HCT: 20.4 % — ABNORMAL LOW (ref 36.0–46.0)
Hemoglobin: 6.9 g/dL — CL (ref 12.0–15.0)
Immature Granulocytes: 2 %
Lymphocytes Relative: 8 %
Lymphs Abs: 1.1 10*3/uL (ref 0.7–4.0)
MCH: 25.5 pg — ABNORMAL LOW (ref 26.0–34.0)
MCHC: 33.8 g/dL (ref 30.0–36.0)
MCV: 75.3 fL — ABNORMAL LOW (ref 80.0–100.0)
Monocytes Absolute: 0.5 10*3/uL (ref 0.1–1.0)
Monocytes Relative: 4 %
Neutro Abs: 11.8 10*3/uL — ABNORMAL HIGH (ref 1.7–7.7)
Neutrophils Relative %: 86 %
Platelets: 132 10*3/uL — ABNORMAL LOW (ref 150–400)
RBC: 2.71 MIL/uL — ABNORMAL LOW (ref 3.87–5.11)
RDW: 16.6 % — ABNORMAL HIGH (ref 11.5–15.5)
WBC: 13.7 10*3/uL — ABNORMAL HIGH (ref 4.0–10.5)
nRBC: 0 % (ref 0.0–0.2)

## 2021-06-21 LAB — GLUCOSE, CAPILLARY
Glucose-Capillary: 138 mg/dL — ABNORMAL HIGH (ref 70–99)
Glucose-Capillary: 151 mg/dL — ABNORMAL HIGH (ref 70–99)
Glucose-Capillary: 149 mg/dL — ABNORMAL HIGH (ref 70–99)
Glucose-Capillary: 142 mg/dL — ABNORMAL HIGH (ref 70–99)
Glucose-Capillary: 150 mg/dL — ABNORMAL HIGH (ref 70–99)
Glucose-Capillary: 158 mg/dL — ABNORMAL HIGH (ref 70–99)

## 2021-06-21 LAB — HEMOGLOBIN AND HEMATOCRIT, BLOOD
HCT: 21.2 % — ABNORMAL LOW (ref 36.0–46.0)
Hemoglobin: 7 g/dL — ABNORMAL LOW (ref 12.0–15.0)

## 2021-06-21 LAB — MAGNESIUM: Magnesium: 2.5 mg/dL — ABNORMAL HIGH (ref 1.7–2.4)

## 2021-06-21 LAB — CBC
HCT: 29.5 % — ABNORMAL LOW (ref 36.0–46.0)
Hemoglobin: 9.4 g/dL — ABNORMAL LOW (ref 12.0–15.0)
MCH: 26.3 pg (ref 26.0–34.0)
MCHC: 31.9 g/dL (ref 30.0–36.0)
MCV: 82.4 fL (ref 80.0–100.0)
Platelets: 132 10*3/uL — ABNORMAL LOW (ref 150–400)
RBC: 3.58 MIL/uL — ABNORMAL LOW (ref 3.87–5.11)
RDW: 17.2 % — ABNORMAL HIGH (ref 11.5–15.5)
WBC: 16.4 10*3/uL — ABNORMAL HIGH (ref 4.0–10.5)
nRBC: 0 % (ref 0.0–0.2)

## 2021-06-21 LAB — BASIC METABOLIC PANEL
Anion gap: 10 (ref 5–15)
BUN: 34 mg/dL — ABNORMAL HIGH (ref 6–20)
CO2: 23 mmol/L (ref 22–32)
Calcium: 8.1 mg/dL — ABNORMAL LOW (ref 8.9–10.3)
Chloride: 105 mmol/L (ref 98–111)
Creatinine, Ser: 1.12 mg/dL — ABNORMAL HIGH (ref 0.44–1.00)
GFR, Estimated: >60 mL/min (ref >60)
Glucose, Bld: 134 mg/dL — ABNORMAL HIGH (ref 70–99)
Potassium: 4.2 mmol/L (ref 3.5–5.1)
Sodium: 138 mmol/L (ref 135–145)

## 2021-06-21 LAB — PHOSPHORUS: Phosphorus: 4.3 mg/dL (ref 2.5–4.6)

## 2021-06-21 LAB — PREPARE RBC (CROSSMATCH)

## 2021-06-21 LAB — ABO/RH: ABO/RH(D): A POS

## 2021-06-21 LAB — LDL CHOLESTEROL, DIRECT: Direct LDL: 27.7 mg/dL (ref 0–99)

## 2021-06-21 MED ORDER — CLOZAPINE 100 MG PO TABS
100.0000 mg | ORAL_TABLET | Freq: Every day | ORAL | Status: DC
  Administered 2021-06-21 – 2021-06-28 (×7): 100 mg
  Filled 2021-06-21 (×10): qty 1

## 2021-06-21 MED ORDER — SODIUM CHLORIDE 0.9% IV SOLUTION: Freq: Once | INTRAVENOUS | Status: AC

## 2021-06-21 MED ORDER — GADOBUTROL 1 MMOL/ML IV SOLN
8.5000 mL | Freq: Once | INTRAVENOUS | Status: AC | PRN
  Administered 2021-06-21: 8.5 mL via INTRAVENOUS

## 2021-06-21 MED ORDER — IOHEXOL 300 MG/ML  SOLN
80.0000 mL | Freq: Once | INTRAMUSCULAR | Status: AC | PRN
  Administered 2021-06-21: 80 mL via INTRAVENOUS

## 2021-06-21 MED ORDER — LACTATED RINGERS IV SOLN: INTRAVENOUS | Status: DC

## 2021-06-21 NOTE — Progress Notes (Signed)
On 1200 neuro assessment, patient noted to have purposeful movement approximately 4 to noxious stimuli on RUE and nonpurposeful flicker of 1 on LUE. When RN went to switch mitten from LUE to RUE, pt purposefully reached to grab ET tube with LUE. Mittens applied to both upper extremities. Dr. Lynetta Mare notified of purposeful movement on both arms.

## 2021-06-21 NOTE — Progress Notes (Signed)
OT Cancellation Note  Patient Details Name: Debra Klein MRN: [id number] DOB: January 04, 1976   Cancelled Treatment:    Reason Eval/Treat Not Completed: Patient not medically ready (encephalopathic / no sedation with minimal responses to RN staff) Pt is weaning at this time   Fleeta Emmer, OTR/L  [place] Office: [phone] .  Debra Klein 06/21/2021, 11:36 AM

## 2021-06-21 NOTE — Progress Notes (Signed)
Patient transported to CT from 4N32, then to MRI where another RT took over transport. No adverse events noted. Patient was suctioned prior to trip.

## 2021-06-21 NOTE — Progress Notes (Addendum)
Pt not having bowel movements.  Blood occult ordered and attempted to be collected.  Little smear of stool collected from rectum per qtip.  No blood noted at this time.  Sent stool down to lab.  Lab notified RN that it was on the wrong card.  Attempted to recollect stool and place on correct card.  When qtip was pulled out of rectum, small amount of blood was on it along with stool.  Unsure if blood was already in rectum or new blood from previous attempted sample collections.  Did not want to chance a false positive result.  Will collect an accurate stool sample at a later time.

## 2021-06-21 NOTE — Progress Notes (Signed)
eLink Physician-Brief Progress Note Patient Name: Debra Klein DOB: 1975/11/24 MRN: [id number]   Date of Service  06/21/2021  HPI/Events of Note  Hemoglobin dropped 2 gm to  6.9 gm / dl, no evidence of overt hemorrhage (it maybe dilutional since patient is 2 liters positive), cannot reliably assess abdominal pain as patient is intubated, sedated  and mechanically ventilated.  eICU Interventions  Transfuse 1 unit PRBC, obtain stool occult blood, serial H & H.        Kerry Kass Jonte Shiller 06/21/2021, 4:14 AM

## 2021-06-21 NOTE — Progress Notes (Signed)
NAME:  Debra Klein, MRN:  [id number], DOB:  01-Apr-1975, LOS: 2 ADMISSION DATE:  06/19/2021, CONSULTATION DATE:  06/19/21 REFERRING MD:  Laurena Spies, CHIEF COMPLAINT:  respiratory failure   History of Present Illness:  Debra Klein is a 46 year old woman with a history of schizophrenia who presented to the hospital today with acute encephalopathy. History obtained through chart review:   Family reports that about 3 weeks ago the patient started to be less verbal, but was still managing her medications. They were noticing for the last few days they would find a pill or 2 dropped on the floor although she was still overall taking her medications.  Her verbal output was actually improving in the last week or so, but then acutely today she was completely nonverbal and so they came to the ED for further evaluation.  They report she did not have any other complaints and did not notice any fevers or any physical issues other than left leg swelling and pain   Per chart review, she stopped taking her medications approximately 1 week ago, had a fall on 06/15/2021 with left ankle swelling, became nonverbal on 6/6, and subsequently had another fall on 6/7 found to have a left lower leg fracture and discharged with a cam walker boot after radiographs of her left ankle and tibia/fibula revealed previous intramedullary rodding and rod removal of the left tibia without clear evidence of acute fracture.  Subsequently she fell again on 6/8 and was found to be febrile (101.2), tachycardic to 110s, noted to be nonverbal, not following commands, frequently dozing off but withdrawing in all 4 extremities without a facial droop.  She was started on vancomycin, ceftriaxone, acyclovir for meningitis coverage.  Additionally, per hospitalist admission note LP was attempted but unsuccessful at bedside.  Subsequently at approximately 9 PM the patient had a change in mental status (drooling) with gaze deviation to the left and was  intubated. PCCM consulted. Head CT demonstrating an insular hypodensity concerning for MCA infarct.   Pertinent  Medical History  Schizophrenia HTN anemia  Significant Hospital Events: Including procedures, antibiotic start and stop dates in addition to other pertinent events   6/7: Fall --> fibula fracture, Minimally verbal at an outpatient visit with [facility]  6/8: Presented to the ED nonverbal with altered mentation. Progressively worsening mentation with requirement of intubation for airway protection.  Empirically treated for sepsis, presumed meningitis. MRI with large MCA infarct and punctate right hemisphere infarcts.  6/9: Blood cultures obtained on arrival positive for MSSA  6/10 MRI knee demonstrates septic arthritis.   Interim History / Subjective:   Has defervesced. Still very encephalopathic.   Objective   Blood pressure 129/87, pulse 93, temperature 98 F (36.7 C), temperature source Axillary, resp. rate 17, height 5\' 6"  (1.676 m), weight 86.4 kg, SpO2 100 %.    Vent Mode: CPAP;PSV FiO2 (%):  [30 %] 30 % Set Rate:  [14 bmp] 14 bmp Vt Set:  [470 mL] 470 mL PEEP:  [5 cmH20] 5 cmH20 Pressure Support:  [8 RCB63-84 cmH20] 8 cmH20 Plateau Pressure:  [9 cmH20-14 cmH20] 12 cmH20   Intake/Output Summary (Last 24 hours) at 06/21/2021 1024 Last data filed at 06/21/2021 0800 Gross per 24 hour  Intake 3245.52 ml  Output 745 ml  Net 2500.52 ml    Filed Weights   06/19/21 2159 06/20/21 0457 06/21/21 0500  Weight: 81.9 kg 83.9 kg 86.4 kg   Examination: General: Critically ill-appearing woman lying in bed intubated, no acute distress HENT: /AT,  eyes anicteric, endotracheal tube in place Lungs: Clear to auscultation throughout. No increased work of breathing.  Cardiovascular: Regular rate and rhythm. Subtle 1/6 systolic murmur heard best over LLSB using bell.  Abdomen: Soft, nontender, active bowel sounds.  Extremities: Left lower extremity in walking boot.  No right  lower extremity edema. Neuro:  movement of the RUE but no movement left side.   Pupils unequal (pinpoint right, 1 mm left), both responsive to light.  Derm: Abrasions on bilateral knees.   Assessment & Plan:  Acute respiratory failure with hypoxia (HCC) Currently weaning on mechanical ventilation  -Daily SBT -Mental status likely to dictate extubation  Cerebrovascular accident (CVA) due to embolic occlusion of left middle cerebral artery (HCC) CVA secondary to septic embolization from Staph aureus bacteremia. L MCA stroke on MRI but not moving left side.   -Outside window for cerebrovascular intervention. -Repeat imaging if doesn't wake up further and start to move left side.  -Likely to require stroke rehabilitation.  Transaminitis Due to sepsis.  Improving.  AKI (acute kidney injury) ([place]) Worsening creatinine in face of bacteremia.  Likely metastatic renal infections from Staphylococcus aureus.  -Follow creatinine  Schizophrenia ([place]) -Consider restarting home medications as mental status improves.  Toxic encephalopathy Remains globally encephalopathic likely secondary to sepsis as well as MCA stroke.  -Keep off sedation and allow sensorium to clear. -Degree of encephalopathy appears to be out of proportion with size of stroke. At risk for multiple stroke. - Re-image if exam doesn't improve further off sedation.   Bacteremia due to methicillin susceptible Staphylococcus aureus (MSSA) Source unclear. Echocardiogram shows normal LV RV function.  Vegetations on both anterior and posterior mitral leaflets with 2 eccentric regurgitation jets.  -Requires 6 weeks nafcillin. -Currently not suitable for operative repair.  Not in acute heart failure. -Allow patient to recover from stroke and reassess necessity for surgical intervention at that time.  Sepsis ([place]) Due to Staphylococcus aureus bacteremia. Does not appear in shock at this time.  Septic arthritis of knee, left  (HCC) As demonstrated by MRI No significant erythema or warmth Small effusion only on examination.   -Antibiotics for 6 weeks and follow clinically  - Drain if worsens clinically   Best Practice (right click and "Reselect all SmartList Selections" daily)   Diet/type: tubefeeds DVT prophylaxis: prophylactic heparin  GI prophylaxis: PPI Lines: N/A Foley:  Yes, and it is still needed Code Status:  full code Last date of multidisciplinary goals of care discussion [Mother and sister updated at bedside on 6/9]  CRITICAL CARE Performed by: Kipp Brood   Total critical care time: 40 minutes  Critical care time was exclusive of separately billable procedures and treating other patients.  Critical care was necessary to treat or prevent imminent or life-threatening deterioration.  Critical care was time spent personally by me on the following activities: development of treatment plan with patient and/or surrogate as well as nursing, discussions with consultants, evaluation of patient's response to treatment, examination of patient, obtaining history from patient or surrogate, ordering and performing treatments and interventions, ordering and review of laboratory studies, ordering and review of radiographic studies, pulse oximetry, re-evaluation of patient's condition and participation in multidisciplinary rounds.  Kipp Brood, MD [facility] ICU Physician [place]  Pager: [phone] Mobile: [phone] After hours: [phone].   06/21/2021, 10:24 AM

## 2021-06-21 NOTE — Progress Notes (Signed)
PT Cancellation Note  Patient Details Name: Debra Klein MRN: [id number] DOB: [birth year]   Cancelled Treatment:    Reason Eval/Treat Not Completed: Fatigue/lethargy limiting ability to participate this morning, per conversation with RN, pt with minimal response despite sedation wean. Will continue to follow and evaluate as time/schedule allow.   West Carbo, PT, DPT   Acute Rehabilitation Department   Sandra Cockayne 06/21/2021, 4:42 PM

## 2021-06-21 NOTE — Progress Notes (Signed)
STROKE TEAM PROGRESS NOTE   INTERVAL HISTORY Patient has been off sedation since this morning.  She is awake with eyes open but is globally aphasic and not following any commands.  She has left gaze deviation.  She is able to move right side purposefully against gravity with does not appear to move the left side well.  MRI of the left knee yesterday demonstrated septic arthritis.  Blood cultures on arrival were positive for MSSA she is currently on vancomycin and ceftriaxone.  White count remains mildly elevated at 13.7.  She had significant drop in hematocrit to 20.4 she is afebrile this morning.  She is leaning on ventilator with spontaneous breathing trials. Vitals:   06/21/21 0615 06/21/21 0700 06/21/21 0800 06/21/21 0844  BP: 128/85 135/87 129/87   Pulse: 87 76 75 93  Resp: 15 16 15 17   Temp: 98.1 F (36.7 C)  98 F (36.7 C)   TempSrc: Axillary  Axillary   SpO2: 100% 100% 100% 100%  Weight:      Height:       CBC:  Recent Labs  Lab 06/20/21 1003 06/21/21 0256 06/21/21 0431  WBC 13.6* 13.7*  --   NEUTROABS 12.3* 11.8*  --   HGB 8.5* 6.9* 7.0*  HCT 25.9* 20.4* 21.2*  MCV 77.1* 75.3*  --   PLT 109* 132*  --    Basic Metabolic Panel:  Recent Labs  Lab 06/20/21 0301 06/21/21 0256  NA 135 138  K 3.4* 4.2  CL 102 105  CO2 25 23  GLUCOSE 126* 134*  BUN 28* 34*  CREATININE 1.09* 1.12*  CALCIUM 7.9* 8.1*  MG 1.8 2.5*  PHOS 1.9* 4.3   Lipid Panel:  Recent Labs  Lab 06/20/21 0200 06/20/21 0301  CHOL 161  --   TRIG 522* 479*  HDL <10*  --   CHOLHDL  UNABLE TO CALCULATE  --   VLDL UNABLE TO CALCULATE  --   LDLCALC UNABLE TO CALCULATE IF TRIGLYCERIDE OVER 400 mg/dL  --    HgbA1c:  Recent Labs  Lab 06/20/21 0015  HGBA1C 6.5*   Urine Drug Screen:  Recent Labs  Lab 06/19/21 1330  LABOPIA NONE DETECTED  COCAINSCRNUR NONE DETECTED  LABBENZ NONE DETECTED  AMPHETMU NONE DETECTED  THCU NONE DETECTED  LABBARB NONE DETECTED    Alcohol Level  Recent Labs  Lab  06/19/21 1341  ETH <10    IMAGING past 24 hours MR KNEE LEFT W WO CONTRAST  Result Date: 06/21/2021 CLINICAL DATA:  Left knee pain EXAM: MRI OF THE LEFT KNEE WITHOUT AND WITH CONTRAST TECHNIQUE: Multiplanar, multisequence MR imaging of the left knee was performed both before and after administration of intravenous contrast. CONTRAST:  8.76mL GADAVIST GADOBUTROL 1 MMOL/ML IV SOLN COMPARISON:  Left knee MRI 01/01/2021 FINDINGS: MENISCI Medial: There is intrasubstance degenerative signal without definitive medial meniscus tear. Lateral: Complex degenerative tearing of the anterior horn of the lateral meniscus extending to the horn-body junction. There is substance loss of the macerated appearance of the anterior horn. LIGAMENTS Cruciates: ACL and PCL are intact. Collaterals: Intact MCL with mild thickening proximally, likely related to prior trauma. Lateral collateral ligament complex is intact. CARTILAGE Patellofemoral: Progressive patellofemoral chondrosis with areas of intermediate and high-grade cartilage loss worst along the median patellar ridge. Medial: Progressive, full-thickness cartilage loss along the weight-bearing surfaces. Lateral: Progressive, intermediate grade cartilage loss along the weight-bearing surfaces. JOINT: Small heterogeneous joint effusion with thick synovial enhancement. POPLITEAL FOSSA: Miniscule Baker's cyst. EXTENSOR MECHANISM:  Intact quadriceps tendon. Prior patellar tendon repair. There is enlargement of the lobular intraosseous cyst within the proximal tibia, measuring 2.6 x 4.0 x 2.7 cm, previously 3.4 x 2.4 x 1.6 cm (coronal PD image 21, sagittal PD image 24). The inferior margin is also wider measuring up to 1.3 cm in width, previously 0.6 cm (sagittal PD image 22). This intraosseous cystic area demonstrates globular intermediate signal within the superior aspect (series 8, image 20). BONES: New, intense marrow edema within the proximal tibia and to a slightly lesser degree  in the fibular head and femoral condyles. Low T1 marrow signal in the proximal tibia. There is also increased marrow edema in the patella. Other: There is extensive soft tissue swelling along the knee. There are lobular rim enhancing fluid collections along the anterolateral knee, measuring 0.6 cm short axis at the level of the distal quadriceps tendon (series 8, image 6), and up to 1.5 cm short axis along the lateral knee superficial to the attachment of the IT band (series 8, image 15). There are small rim enhancing collections within the popliteus muscle, proximal anterior compartment musculature of the lower leg (series 16, image 26), and distal aspect of the vastus medialis posteriorly (series 16, image 2). 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 Moderate-sized joint effusion with thick synovial enhancement. Superficial rim enhancing fluid collections, likely abscesses, along the anterolateral knee, measurements above. Small developing abscesses within the popliteus muscle, proximal anterior compartment musculature of the lower leg, and distal vastus medialis posteriorly. Enlargement of an intraosseous cyst in the proximal tibia which communicates with the joint via prior patellar tendon repair, which is also likely infected. Progressive tricompartment arthritis, cartilaginous abnormalities as described above. Complex degenerative tear involving the anterior horn of the lateral meniscus extending to the horn-body junction. Electronically Signed   By: Maurine Simmering M.D.   On: 06/21/2021 08:01   CT MAXILLOFACIAL W CONTRAST  Result Date: 06/21/2021 CLINICAL DATA:  MSSA bacteremia EXAM: CT MAXILLOFACIAL WITH CONTRAST TECHNIQUE: Multidetector CT imaging of the maxillofacial structures was performed with intravenous contrast. Multiplanar CT image reconstructions were also generated. RADIATION DOSE REDUCTION: This exam was performed according  to the departmental dose-optimization program which includes automated exposure control, adjustment of the mA and/or kV according to patient size and/or use of iterative reconstruction technique. CONTRAST:  44mL OMNIPAQUE IOHEXOL 300 MG/ML  SOLN COMPARISON:  None Available. FINDINGS: Osseous: There is a periapical lucency at the root of the most posterior remaining right mandibular tooth. Orbits: Negative. No traumatic or inflammatory finding. Sinuses: Clear. Soft tissues: Negative. Limited intracranial: Recent left insula infarct. IMPRESSION: 1. Recent left insula infarct. 2. Periapical lucency at the root of the most posterior remaining right mandibular tooth. Electronically Signed   By: Ulyses Jarred M.D.   On: 06/21/2021 00:46    PHYSICAL EXAM General:  Intubated, well-nourished, well-developed patient in no acute distress.  Patient is intubated she has a left knee brace. Respiratory:  Respirations synchronous with ventilator Neurological : She is intubated.  Off sedation.  Opens eyes.  Left gaze deviation.  Globally aphasic.  Will not follow any commands.  Does not blink to threat on either side.:  Pupils equal and reactive, positive corneal, cough and gag reflexes, moves right upper extremity and lower extremity purposefully to noxious stimuli.  Trace movement in the left upper and lower extremity only today.  Plantars of both mute  ASSESSMENT/PLAN Debra Klein is a 46 y.o. female  with history of schizophrenia, HTN and HLD presenting with AMS, fever and acute onset left gaze deviation.  She may have had seizure activity at this time, cEEG ongoing.  She was intubated for airway protection.  CT head was concerning for left sided stroke, and CTA revealed left MCA M2 branch occlusion.  MRI brain reveals large left MCA territory infarct and scattered acute infarcts in right hemisphere and left occipital lobe.  Stroke: Left MCA infarct and small scattered acute infarcts likely secondary due to  embolism from cryptogenic source though strong suspicion for bacterial endocarditis CT head Loss of gray-white matter differentiation along left insula CTA head & neck occlusion of left MCA M2 branch in insula.  No other intracranial arterial occlusion or high grade stenosis MRI  large left MCA territory cortical infarct, scattered punctate foci of acute ischemia in right hemisphere and left occipital lobe 2D Echo pending LDL UNABLE TO CALCULATE IF TRIGLYCERIDE OVER 400 mg/dL HgbA1c 6.5 VTE prophylaxis - SQH    Diet   Diet NPO time specified   No antithrombotic prior to admission, now on aspirin 81 mg daily.  Therapy recommendations:  pending Disposition:  pending  Possible seizures Patient had possible seizure on 6/8 with new onset left gaze deviation and altered mental status Continue cEEG  Hypertension Home meds:  none Stable Permissive hypertension (OK if < 220/120) but gradually normalize in 5-7 days Long-term BP goal normotensive  Hyperlipidemia Home meds:  atorvastatin 20 mg daily, increased to 40 mg daily LDL UNABLE TO CALCULATE IF TRIGLYCERIDE OVER 400 mg/dL, goal < 70 Continue statin at discharge  Diabetes type II Controlled Home meds:  none HgbA1c 6.5, goal < 7.0 CBGs Recent Labs    06/21/21 0304 06/21/21 0834 06/21/21 1202  GLUCAP 138* 151* 149*    SSI  Schizophrenia Patient has history of schizophrenia Had stopped taking medications about a week prior to admission and had become nonverbal Consult psychiatry when patient extubated  Respiratory Failure Patient was intubated for airway protection Ventilator management per CCM  Other Stroke Risk Factors none  Other Active Problems none  Hospital day # 2      Patient presented with altered mental status in the setting of MSSA bacteremia with suspicion for endocarditis.  MRI scan shows left MCA inferior infarct with CT angio showing left M2 occlusion.  Etiology of the infarct is likely embolic  from endocarditis is also possible.  Recommend continue ongoing cardiac monitoring  2D echo shows a clear 10 x 6 mm mitral valve vegetation vegetation.  Patient neurological exam appears to be out of proportion to the location of her stroke and may need to consider repeating CT scan of the head later today unless her exam improves as the day goes on.  Continue aspirin for now.  Continue antibiotics as per ID team.  Discussed with Dr. Lynetta Mare critical care medicine.This patient is critically ill and at significant risk of neurological worsening, death and care requires constant monitoring of vital signs, hemodynamics,respiratory and cardiac monitoring, extensive review of multiple databases, frequent neurological assessment, discussion with family, other specialists and medical decision making of high complexity.I have made any additions or clarifications directly to the above note.This critical care time does not reflect procedure time, or teaching time or supervisory time of PA/NP/Med Resident etc but could involve care discussion time.  I spent 35 minutes of neurocritical care time  in the care of  this patient.      Antony Contras, MD Medical Director [facility] Pager: [phone] 06/21/2021 12:08 PM   To contact Stroke Continuity provider, please refer to [email or web address] After hours, contact General Neurology

## 2021-06-21 NOTE — Assessment & Plan Note (Signed)
As demonstrated by MRI No significant erythema or warmth Small effusion only on examination.   -Antibiotics for 6 weeks and follow clinically  - Drain if worsens clinically

## 2021-06-21 NOTE — Progress Notes (Signed)
CCM notified at 0355 of the following critical lab value: Hgb 6.9.  No signs of bleeding.  Vitals stable.  No new orders at this time.

## 2021-06-22 DIAGNOSIS — A419 Sepsis, unspecified organism: Secondary | ICD-10-CM | POA: Diagnosis not present

## 2021-06-22 DIAGNOSIS — A4101 Sepsis due to Methicillin susceptible Staphylococcus aureus: Secondary | ICD-10-CM | POA: Diagnosis not present

## 2021-06-22 DIAGNOSIS — G9341 Metabolic encephalopathy: Secondary | ICD-10-CM | POA: Diagnosis not present

## 2021-06-22 DIAGNOSIS — J9601 Acute respiratory failure with hypoxia: Secondary | ICD-10-CM | POA: Diagnosis not present

## 2021-06-22 DIAGNOSIS — R6520 Severe sepsis without septic shock: Secondary | ICD-10-CM | POA: Diagnosis not present

## 2021-06-22 LAB — CBC WITH DIFFERENTIAL/PLATELET
Abs Immature Granulocytes: 0 10*3/uL (ref 0.00–0.07)
Basophils Absolute: 0 10*3/uL (ref 0.0–0.1)
Basophils Relative: 0 %
Eosinophils Absolute: 0 10*3/uL (ref 0.0–0.5)
Eosinophils Relative: 0 %
HCT: 26.7 % — ABNORMAL LOW (ref 36.0–46.0)
Hemoglobin: 8.9 g/dL — ABNORMAL LOW (ref 12.0–15.0)
Lymphocytes Relative: 12 %
Lymphs Abs: 1.8 10*3/uL (ref 0.7–4.0)
MCH: 26 pg (ref 26.0–34.0)
MCHC: 33.3 g/dL (ref 30.0–36.0)
MCV: 78.1 fL — ABNORMAL LOW (ref 80.0–100.0)
Monocytes Absolute: 0.9 10*3/uL (ref 0.1–1.0)
Monocytes Relative: 6 %
Neutro Abs: 12.3 10*3/uL — ABNORMAL HIGH (ref 1.7–7.7)
Neutrophils Relative %: 82 %
Platelets: 156 10*3/uL (ref 150–400)
RBC: 3.42 MIL/uL — ABNORMAL LOW (ref 3.87–5.11)
RDW: 17.1 % — ABNORMAL HIGH (ref 11.5–15.5)
WBC: 15 10*3/uL — ABNORMAL HIGH (ref 4.0–10.5)
nRBC: 0 % (ref 0.0–0.2)
nRBC: 0 /100 WBC

## 2021-06-22 LAB — BASIC METABOLIC PANEL
Anion gap: 10 (ref 5–15)
BUN: 28 mg/dL — ABNORMAL HIGH (ref 6–20)
CO2: 25 mmol/L (ref 22–32)
Calcium: 8 mg/dL — ABNORMAL LOW (ref 8.9–10.3)
Chloride: 110 mmol/L (ref 98–111)
Creatinine, Ser: 0.88 mg/dL (ref 0.44–1.00)
GFR, Estimated: >60 mL/min (ref >60)
Glucose, Bld: 145 mg/dL — ABNORMAL HIGH (ref 70–99)
Potassium: 3.5 mmol/L (ref 3.5–5.1)
Sodium: 145 mmol/L (ref 135–145)

## 2021-06-22 LAB — BPAM RBC
Blood Product Expiration Date: 202307052359
ISSUE DATE / TIME: 202306100553
Unit Type and Rh: 6200

## 2021-06-22 LAB — TYPE AND SCREEN
ABO/RH(D): A POS
Antibody Screen: NEGATIVE
Unit division: 0

## 2021-06-22 LAB — CULTURE, BLOOD (ROUTINE X 2): Special Requests: ADEQUATE

## 2021-06-22 LAB — OCCULT BLOOD X 1 CARD TO LAB, STOOL: Fecal Occult Bld: NEGATIVE

## 2021-06-22 LAB — GLUCOSE, CAPILLARY
Glucose-Capillary: 147 mg/dL — ABNORMAL HIGH (ref 70–99)
Glucose-Capillary: 132 mg/dL — ABNORMAL HIGH (ref 70–99)
Glucose-Capillary: 94 mg/dL (ref 70–99)
Glucose-Capillary: 86 mg/dL (ref 70–99)
Glucose-Capillary: 126 mg/dL — ABNORMAL HIGH (ref 70–99)

## 2021-06-22 LAB — MAGNESIUM: Magnesium: 1.8 mg/dL (ref 1.7–2.4)

## 2021-06-22 LAB — PHOSPHORUS: Phosphorus: 2.9 mg/dL (ref 2.5–4.6)

## 2021-06-22 MED ORDER — POTASSIUM CHLORIDE 20 MEQ PO PACK
40.0000 meq | PACK | Freq: Once | ORAL | Status: AC
  Administered 2021-06-22: 40 meq
  Filled 2021-06-22: qty 2

## 2021-06-22 MED ORDER — FLUPHENAZINE HCL 5 MG PO TABS
2.5000 mg | ORAL_TABLET | Freq: Every day | ORAL | Status: DC
  Administered 2021-06-22 – 2021-06-30 (×8): 2.5 mg
  Filled 2021-06-22 (×12): qty 1

## 2021-06-22 MED ORDER — MAGNESIUM SULFATE 2 GM/50ML IV SOLN
2.0000 g | Freq: Once | INTRAVENOUS | Status: AC
  Administered 2021-06-22: 2 g via INTRAVENOUS
  Filled 2021-06-22: qty 50

## 2021-06-22 NOTE — Progress Notes (Addendum)
NAME:  Debra Klein, MRN:  [id number], DOB:  [birth year], LOS: 3 ADMISSION DATE:  06/19/2021, CONSULTATION DATE:  06/19/21 REFERRING MD:  Laurena Spies, CHIEF COMPLAINT:  respiratory failure   History of Present Illness:  Debra Klein is a 46 year old woman with a history of schizophrenia who presented to the hospital today with acute encephalopathy. History obtained through chart review:   Family reports that about 3 weeks ago the patient started to be less verbal, but was still managing her medications. They were noticing for the last few days they would find a pill or 2 dropped on the floor although she was still overall taking her medications.  Her verbal output was actually improving in the last week or so, but then acutely today she was completely nonverbal and so they came to the ED for further evaluation.  They report she did not have any other complaints and did not notice any fevers or any physical issues other than left leg swelling and pain   Per chart review, she stopped taking her medications approximately 1 week ago, had a fall on 06/15/2021 with left ankle swelling, became nonverbal on 6/6, and subsequently had another fall on 6/7 found to have a left lower leg fracture and discharged with a cam walker boot after radiographs of her left ankle and tibia/fibula revealed previous intramedullary rodding and rod removal of the left tibia without clear evidence of acute fracture.  Subsequently she fell again on 6/8 and was found to be febrile (101.2), tachycardic to 110s, noted to be nonverbal, not following commands, frequently dozing off but withdrawing in all 4 extremities without a facial droop.  She was started on vancomycin, ceftriaxone, acyclovir for meningitis coverage.  Additionally, per hospitalist admission note LP was attempted but unsuccessful at bedside.  Subsequently at approximately 9 PM the patient had a change in mental status (drooling) with gaze deviation to the left and was  intubated. PCCM consulted. Head CT demonstrating an insular hypodensity concerning for MCA infarct.   Pertinent  Medical History  Schizophrenia HTN anemia  Significant Hospital Events: Including procedures, antibiotic start and stop dates in addition to other pertinent events   6/7: Fall --> fibula fracture, Minimally verbal at an outpatient visit with [facility]  6/8: Presented to the ED nonverbal with altered mentation. Progressively worsening mentation with requirement of intubation for airway protection.  Empirically treated for sepsis, presumed meningitis. MRI with large MCA infarct and punctate right hemisphere infarcts.  6/9: Blood cultures obtained on arrival positive for MSSA  6/10 MRI knee demonstrates septic arthritis.   Interim History / Subjective:   More awake, following commands.   Objective   Blood pressure 127/85, pulse 87, temperature 98.6 F (37 C), temperature source Axillary, resp. rate 17, height 5\' 6"  (1.676 m), weight 89 kg, SpO2 100 %.    Vent Mode: PSV;CPAP FiO2 (%):  [30 %] 30 % Set Rate:  [14 bmp] 14 bmp Vt Set:  [470 mL] 470 mL PEEP:  [5 cmH20] 5 cmH20 Pressure Support:  [5 cmH20-8 cmH20] 5 cmH20 Plateau Pressure:  [12 cmH20-14 cmH20] 14 cmH20   Intake/Output Summary (Last 24 hours) at 06/22/2021 1003 Last data filed at 06/22/2021 0700 Gross per 24 hour  Intake 3120.19 ml  Output 700 ml  Net 2420.19 ml    Filed Weights   06/20/21 0457 06/21/21 0500 06/22/21 0500  Weight: 83.9 kg 86.4 kg 89 kg   Examination: General: Critically ill-appearing woman lying in bed intubated, no acute distress HENT: [place]/AT, eyes  anicteric, endotracheal tube in place Lungs: Clear to auscultation throughout. No increased work of breathing.  Cardiovascular: Regular rate and rhythm. Subtle 1/6 systolic murmur heard best over LLSB using bell.  Abdomen: Soft, nontender, active bowel sounds.  Extremities: Left lower extremity in walking boot.  No right lower extremity  edema. Neuro:  awake and follows commands in both UE L>R.  Derm: Abrasions on bilateral knees.   Assessment & Plan:  Acute respiratory failure with hypoxia (HCC) Currently weaning on mechanical ventilation  -Passed SBT  but still to somnolent to extubate - Continue daily SBT.   Cerebrovascular accident (CVA) due to embolic occlusion of left middle cerebral artery (HCC) CVA secondary to septic embolization from Staph aureus bacteremia. L MCA stroke on MRI but not moving left side.   -Outside window for cerebrovascular intervention. -Repeat imaging if doesn't wake up further and start to move left side.  -Likely to require stroke rehabilitation.  AKI (acute kidney injury) ([place]) Worsening creatinine in face of bacteremia.  Likely metastatic renal infections from Staphylococcus aureus.   - Now resolved  Schizophrenia ([place])  -Have restarted clozapine.   Toxic encephalopathy Remains globally encephalopathic likely secondary to sepsis as well as MCA stroke.  -Sensorium is clearing. - Attempt extubation today.  - Reintroduce medications for schizophrenia.   Bacteremia due to methicillin susceptible Staphylococcus aureus (MSSA) Source unclear. Echocardiogram shows normal LV RV function.  Vegetations on both anterior and posterior mitral leaflets with 2 eccentric regurgitation jets.  -Requires 6 weeks nafcillin. -Currently not suitable for operative repair.  Not in acute heart failure. -Allow patient to recover from stroke and reassess necessity for surgical intervention at that time.  Sepsis ([place]) Due to Staphylococcus aureus bacteremia. Does not appear in shock at this time.  Septic arthritis of knee, left (HCC) As demonstrated by MRI No significant erythema or warmth Small effusion only on examination.   -Antibiotics for 6 weeks and follow clinically  - Drain if worsens clinically   Best Practice (right click and "Reselect all SmartList Selections" daily)    Diet/type: tubefeeds DVT prophylaxis: prophylactic heparin  GI prophylaxis: PPI Lines: N/A Foley:  Yes, and it is still needed Code Status:  full code Last date of multidisciplinary goals of care discussion [Mother and sister updated at bedside on 6/9]  CRITICAL CARE Performed by: Kipp Brood   Total critical care time: 40 minutes  Critical care time was exclusive of separately billable procedures and treating other patients.  Critical care was necessary to treat or prevent imminent or life-threatening deterioration.  Critical care was time spent personally by me on the following activities: development of treatment plan with patient and/or surrogate as well as nursing, discussions with consultants, evaluation of patient's response to treatment, examination of patient, obtaining history from patient or surrogate, ordering and performing treatments and interventions, ordering and review of laboratory studies, ordering and review of radiographic studies, pulse oximetry, re-evaluation of patient's condition and participation in multidisciplinary rounds.  Kipp Brood, MD [facility] ICU Physician [facility]  Pager: [phone] Mobile: [phone] After hours: [phone].   06/22/2021, 10:03 AM

## 2021-06-22 NOTE — Progress Notes (Signed)
[facility] ADULT ICU REPLACEMENT PROTOCOL   The patient does apply for the [facility] Adult ICU Electrolyte Replacment Protocol based on the criteria listed below:   1.Exclusion criteria: TCTS patients, ECMO patients, and Dialysis patients 2. Is GFR >/= 30 ml/min? Yes.    Patient's GFR today is >60 3. Is SCr </= 2? Yes.   Patient's SCr is 0.88 mg/dL 4. Did SCr increase >/= 0.5 in 24 hours? No. 5.Pt's weight >40kg  Yes.   6. Abnormal electrolyte(s): K, Mag  7. Electrolytes replaced per protocol 8.  Call MD STAT for K+ </= 2.5, Phos </= 1, or Mag </= 1 Physician:  Rosezella Rumpf Marjoria Mancillas 06/22/2021 5:20 AM

## 2021-06-22 NOTE — Evaluation (Signed)
Physical Therapy Evaluation Patient Details Name: Debra Klein MRN: [id number] DOB: 1975/06/12 Today's Date: 06/22/2021  History of Present Illness  46 y.o. female presents to [facility] 06/19/2021 with AMS, poor PO intake and recent falls, along with recentL ankle fx. Pt admitted with severes sepsis of unknown source. MRI 6/8 shows large L MCA infarct. Pt required intubation 6/8. PMH includes schizophrenia, HTN, anemia.   Clinical Impression  Pt in bed upon arrival of PT, agreeable to evaluation at this time. Prior to admission the pt was independent with mobility, but per chart has had increased falls in last week PTA, one resulting in possible L fibula fx. The pt now presents with limitations in functional mobility, strength, stability, cognition, command following, and activity tolerance due to above dx, and will continue to benefit from skilled PT to address these deficits. The pt was able to follow a few commands with R extremities with max verbal stimulation and placing extremity in the pt's visual field. VSS with mobility, but pt fatigued after ~15 min. Of note, pt without psychiatric medications this entire admission (and 1 week PTA) with new orders to resume. The pt will continue to benefit from skilled PT acutely to progress deficits and OOB mobility. Anticipate will need continued rehab once medically stable for d/c.         Recommendations for follow up therapy are one component of a multi-disciplinary discharge planning process, led by the attending physician.  Recommendations may be updated based on patient status, additional functional criteria and insurance authorization.  Follow Up Recommendations Acute inpatient rehab (3hours/day)    Assistance Recommended at Discharge Frequent or constant Supervision/Assistance  Patient can return home with the following  Two people to help with walking and/or transfers;Two people to help with bathing/dressing/bathroom;Assistance with  cooking/housework;Assistance with feeding;Direct supervision/assist for medications management;Direct supervision/assist for financial management;Assist for transportation;Help with stairs or ramp for entrance    Equipment Recommendations  (defer to post acute)  Recommendations for Other Services  Rehab consult    Functional Status Assessment Patient has had a recent decline in their functional status and demonstrates the ability to make significant improvements in function in a reasonable and predictable amount of time.     Precautions / Restrictions Precautions Precautions: Fall Precaution Comments: rectal pouch, purewick, NG tube, feeding tube oral with NG tube, bil mittens, Required Braces or Orthoses: Splint/Cast Splint/Cast: L cam boot Splint/Cast - Date Prophylactic Dressing Applied (if applicable): 05/39/76 Restrictions Weight Bearing Restrictions: Yes LLE Weight Bearing: Weight bearing as tolerated      Mobility  Bed Mobility Overal bed mobility: Needs Assistance Bed Mobility: Rolling, Supine to Sit, Sit to Supine Rolling: Max assist   Supine to sit: +2 for physical assistance, Max assist Sit to supine: +2 for physical assistance, Max assist   General bed mobility comments: pt requires pad use for pivot toward EOB. once pt rising initiates with trunk and completes sitting task. pt with R lateral lean notes. pt helping control trunk to bed surface. Ot attempting to adjust a sheet and pt elevated trunk off bed surface to (A) with task. Pt requires total (A) for bil LE on and off bed surface with grimace and L UE response to movement of L LE    Transfers                   General transfer comment: unsafe to attempt due to lack of command following and assist needed in sitting      Balance  Overall balance assessment: Needs assistance Sitting-balance support: Single extremity supported, Feet unsupported Sitting balance-Leahy Scale: Zero   Postural control:  Right lateral lean                                   Pertinent Vitals/Pain Pain Assessment Pain Assessment: Faces Faces Pain Scale: Hurts even more Pain Location: L LE movement Pain Descriptors / Indicators: Grimacing Pain Intervention(s): Monitored during session, Repositioned, Limited activity within patient's tolerance    Home Living Family/patient expects to be discharged to:: Unsure                        Prior Function Prior Level of Function : Independent/Modified Independent                     Hand Dominance   Dominant Hand:  (unknown at this time. pt using L UE more consistently)    Extremity/Trunk Assessment   Upper Extremity Assessment Upper Extremity Assessment: Defer to OT evaluation RUE Deficits / Details: movement noted, does rise against gravity with turn to supine and control descend to bed surface. LUE Deficits / Details: does not follow commands but holding wash cloth and placing it back on PT hands    Lower Extremity Assessment Lower Extremity Assessment: RLE deficits/detail;LLE deficits/detail RLE Deficits / Details: limited due to poor command following, pt able to complete single command to "wiggle your toes" x1after getting pt attention and placing RLE into pt's visual field. LLE Deficits / Details: limited assessment due to impaired cognition, pt responding to any movement of LLE, likel due to pain. pt did not follow any commands regardin movement of LLE LLE: Unable to fully assess due to pain    Cervical / Trunk Assessment Cervical / Trunk Assessment: Kyphotic  Communication   Communication: Other (comment) (intubated)  Cognition Arousal/Alertness: Awake/alert Behavior During Therapy: Flat affect Overall Cognitive Status: Impaired/Different from baseline Area of Impairment: Attention, Following commands                   Current Attention Level: Focused   Following Commands: Follows one step commands  inconsistently, Follows one step commands with increased time       General Comments: pt following commands with auditory cue of name call and extremitity in visual field to wiggle toes R LE and squeeze R UE. Pt does not follow command with L UE but using purposeful to pull down gown and to help with balance at EOB. Pt requires name call before all commands with visual attention then a delayed response. Of note pt without psychiatric medications for 1 week        General Comments General comments (skin integrity, edema, etc.): VSS, PS CPAP vent oral intubation PEEP 5 FIO2 30 RR 21        Assessment/Plan    PT Assessment Patient needs continued PT services  PT Problem List Decreased strength;Decreased range of motion;Decreased activity tolerance;Decreased balance;Decreased mobility;Decreased cognition;Decreased coordination;Decreased knowledge of use of DME;Decreased safety awareness;Impaired tone;Obesity;Pain       PT Treatment Interventions DME instruction;Gait training;Stair training;Therapeutic activities;Functional mobility training;Therapeutic exercise;Balance training;Neuromuscular re-education;Patient/family education    PT Goals (Current goals can be found in the Care Plan section)  Acute Rehab PT Goals Patient Stated Goal: none stated PT Goal Formulation: With patient Time For Goal Achievement: 07/06/21 Potential to Achieve Goals: Fair    Frequency Min  4X/week     Co-evaluation PT/OT/SLP Co-Evaluation/Treatment: Yes Reason for Co-Treatment: Complexity of the patient's impairments (multi-system involvement);Necessary to address cognition/behavior during functional activity;For patient/therapist safety;To address functional/ADL transfers PT goals addressed during session: Mobility/safety with mobility;Balance;Strengthening/ROM         AM-PAC PT "6 Clicks" Mobility  Outcome Measure Help needed turning from your back to your side while in a flat bed without using  bedrails?: Total Help needed moving from lying on your back to sitting on the side of a flat bed without using bedrails?: Total Help needed moving to and from a bed to a chair (including a wheelchair)?: Total Help needed standing up from a chair using your arms (e.g., wheelchair or bedside chair)?: Total Help needed to walk in hospital room?: Total Help needed climbing 3-5 steps with a railing? : Total 6 Click Score: 6    End of Session Equipment Utilized During Treatment: Oxygen (vent) Activity Tolerance: Patient limited by fatigue;Patient tolerated treatment well Patient left: in bed;with call bell/phone within reach;with bed alarm set;with nursing/sitter in room Nurse Communication: Mobility status PT Visit Diagnosis: Other abnormalities of gait and mobility (R26.89);Muscle weakness (generalized) (M62.81);Hemiplegia and hemiparesis Hemiplegia - Right/Left: Right Hemiplegia - caused by: Cerebral infarction    Time: 2595-6387 PT Time Calculation (min) (ACUTE ONLY): 29 min   Charges:   PT Evaluation $PT Eval High Complexity: 1 High          [place], PT, DPT   Acute Rehabilitation Department  Sandra Cockayne 06/22/2021, 5:56 PM

## 2021-06-22 NOTE — Progress Notes (Signed)
SLP Cancellation Note  Patient Details Name: Dondrea Clendenin MRN: [id number] DOB: 1975-08-07   Cancelled treatment:        Attempted to see pt for cognitive-linguistic assessment.  Pt remains on the vent at this time, but has been weaning.  SLP will follow for medically readiness.  Consider swallowing evaluation post extubation.   Celedonio Savage, MA, [place] Office: [phone] 06/22/2021, 9:23 AM

## 2021-06-22 NOTE — Progress Notes (Signed)
STROKE TEAM PROGRESS NOTE   INTERVAL HISTORY Patient sitting on the side of the bed and working with therapist.  She is awake with eyes open but is globally aphasic and not following any commands consistently.  She has left gaze deviation.  She is able to move right side purposefully against gravity with does not appear to move the left side well but has been observed to do so intermittently..   She is still on ventilator but is being weaned..  White count is slightly improved and hematocrit is also improved to 26.7 Vitals:   06/22/21 0900 06/22/21 1000 06/22/21 1100 06/22/21 1111  BP: 122/84 131/90 135/85   Pulse: 95  97 100  Resp: 19 20 (!) 22 19  Temp:      TempSrc:      SpO2: 100%  100% 100%  Weight:      Height:       CBC:  Recent Labs  Lab 06/21/21 0256 06/21/21 0431 06/21/21 1537 06/22/21 0335  WBC 13.7*  --  16.4* 15.0*  NEUTROABS 11.8*  --   --  12.3*  HGB 6.9*   < > 9.4* 8.9*  HCT 20.4*   < > 29.5* 26.7*  MCV 75.3*  --  82.4 78.1*  PLT 132*  --  132* 156   < > = values in this interval not displayed.   Basic Metabolic Panel:  Recent Labs  Lab 06/21/21 0256 06/22/21 0335  NA 138 145  K 4.2 3.5  CL 105 110  CO2 23 25  GLUCOSE 134* 145*  BUN 34* 28*  CREATININE 1.12* 0.88  CALCIUM 8.1* 8.0*  MG 2.5* 1.8  PHOS 4.3 2.9   Lipid Panel:  Recent Labs  Lab 06/20/21 0200 06/20/21 0301  CHOL 161  --   TRIG 522* 479*  HDL <10*  --   CHOLHDL  UNABLE TO CALCULATE  --   VLDL UNABLE TO CALCULATE  --   LDLCALC UNABLE TO CALCULATE IF TRIGLYCERIDE OVER 400 mg/dL  --    HgbA1c:  Recent Labs  Lab 06/20/21 0015  HGBA1C 6.5*   Urine Drug Screen:  Recent Labs  Lab 06/19/21 1330  LABOPIA NONE DETECTED  COCAINSCRNUR NONE DETECTED  LABBENZ NONE DETECTED  AMPHETMU NONE DETECTED  THCU NONE DETECTED  LABBARB NONE DETECTED    Alcohol Level  Recent Labs  Lab 06/19/21 1341  ETH <10    IMAGING past 24 hours No results found.  PHYSICAL EXAM General:   Intubated, well-nourished, well-developed patient in no acute distress.  Patient is intubated she has a left knee brace. Respiratory:  Respirations synchronous with ventilator Neurological : She is intubated.  Off sedation.  Opens eyes.  Left gaze deviation.  Globally aphasic.  Will not follow any commands.  Does not blink to threat on either side.:  Pupils equal and reactive, positive corneal, cough and gag reflexes, moves right upper extremity and lower extremity purposefully to noxious stimuli.  Trace movement in the left upper and lower extremity only today.  Plantars of both mute  ASSESSMENT/PLAN [name] is a 46 y.o. female with history of schizophrenia, HTN and HLD presenting with AMS, fever and acute onset left gaze deviation.  She may have had seizure activity at this time, cEEG ongoing.  She was intubated for airway protection.  CT head was concerning for left sided stroke, and CTA revealed left MCA M2 branch occlusion.  MRI brain reveals large left MCA territory infarct and scattered acute infarcts  in right hemisphere and left occipital lobe.  Stroke: Left MCA infarct and small scattered acute infarcts likely secondary due to embolism from cryptogenic source though strong suspicion for bacterial endocarditis CT head Loss of gray-white matter differentiation along left insula CTA head & neck occlusion of left MCA M2 branch in insula.  No other intracranial arterial occlusion or high grade stenosis MRI  large left MCA territory cortical infarct, scattered punctate foci of acute ischemia in right hemisphere and left occipital lobe 2D Echo pending LDL direct 27.7 HgbA1c 6.5 VTE prophylaxis - SQH    Diet   Diet NPO time specified   No antithrombotic prior to admission, now on aspirin 81 mg daily.  Therapy recommendations: CLR  disposition:  pending  Possible seizures Patient had possible seizure on 6/8 with new onset left gaze deviation and altered mental status Continue  cEEG  Hypertension Home meds:  none Stable Permissive hypertension (OK if < 220/120) but gradually normalize in 5-7 days Long-term BP goal normotensive  Hyperlipidemia Home meds:  atorvastatin 20 mg daily, increased to 40 mg daily LDL 27.7, goal < 70 Continue statin at discharge  Diabetes type II Controlled Home meds:  none HgbA1c 6.5, goal < 7.0 CBGs Recent Labs    06/21/21 2319 06/22/21 0317 06/22/21 0744  GLUCAP 158* 147* 132*    SSI  Schizophrenia Patient has history of schizophrenia Had stopped taking medications about a week prior to admission and had become nonverbal Consult psychiatry when patient extubated  Respiratory Failure Patient was intubated for airway protection Ventilator management per CCM  Other Stroke Risk Factors none  Other Active Problems none  Hospital day # 3      Patient presented with altered mental status in the setting of MSSA bacteremia with suspicion for endocarditis.  MRI scan shows left MCA inferior infarct with CT angio showing left M2 occlusion.  Etiology of the infarct is likely embolic from endocarditis   Recommend continue ongoing cardiac monitoring  2D echo shows a clear 10 x 6 mm mitral valve vegetation vegetation.  Patient neurological exam appears to be out of proportion to the location of her stroke and may be from her baseline schizophrenia and she is not being on medications on more than a week which could contribute as well.  Continue aspirin for now.  Resume schizophrenia medications.  Continue antibiotics as per ID team.  Discussed with Dr. Lynetta Mare critical care medicine.This patient is critically ill and at significant risk of neurological worsening, death and care requires constant monitoring of vital signs, hemodynamics,respiratory and cardiac monitoring, extensive review of multiple databases, frequent neurological assessment, discussion with family, other specialists and medical decision making of high complexity.I  have made any additions or clarifications directly to the above note.This critical care time does not reflect procedure time, or teaching time or supervisory time of PA/NP/Med Resident etc but could involve care discussion time.  I spent 32 minutes of neurocritical care time  in the care of  this patient.      Antony Contras, MD Medical Director [facility] Pager: [phone] 06/22/2021 11:52 AM   To contact Stroke Continuity provider, please refer to [email or web address] After hours, contact General Neurology

## 2021-06-22 NOTE — Progress Notes (Signed)
RT NOTE: patient placed on CPAP/PSV of 5/5 at 0800.  Currently tolerating well.  Will continue to monitor and wean as tolerated.

## 2021-06-22 NOTE — Evaluation (Signed)
Occupational Therapy Evaluation Patient Details Name: Debra Klein MRN: [id number] DOB: 1975-12-24 Today's Date: 06/22/2021   History of Present Illness 46 y.o. female presents to [facility] hospital 06/19/2021 with AMS, poor PO intake and recent falls, along with questionable L ankle fx. Pt admitted with severes sepsis of unknown source. MRI 6/8 shows large L MCA infarct. Pt required intubation 6/8. PMH includes schizophrenia, HTN, anemia.   Clinical Impression   PT admitted with CVA with sepsis. Pt currently with functional limitiations due to the deficits listed below (see OT problem list). Pt currently following commands on the R side of the body but purposeful use of L UE. Pt responds to pain in the L LE with any movement. Pt awake with a static stare to the L visual field throughout session. Of note pt without psychiatric medications this entire admission with new orders to resume. Pt delayed responses to commands and needs visual attention.  Pt will benefit from skilled OT to increase their independence and safety with adls and balance to allow discharge CIR.       Recommendations for follow up therapy are one component of a multi-disciplinary discharge planning process, led by the attending physician.  Recommendations may be updated based on patient status, additional functional criteria and insurance authorization.   Follow Up Recommendations  Acute inpatient rehab (3hours/day)    Assistance Recommended at Discharge Frequent or constant Supervision/Assistance  Patient can return home with the following Two people to help with walking and/or transfers;Two people to help with bathing/dressing/bathroom    Functional Status Assessment  Patient has had a recent decline in their functional status and demonstrates the ability to make significant improvements in function in a reasonable and predictable amount of time.  Equipment Recommendations  Other (comment) (TBA)    Recommendations for Other  Services Rehab consult     Precautions / Restrictions Precautions Precautions: Fall Precaution Comments: rectal pouch, purewick, NG tube, feeding tube oral with NG tube, bil mittens, Required Braces or Orthoses: Splint/Cast Splint/Cast: L cam boot Splint/Cast - Date Prophylactic Dressing Applied (if applicable): 67/34/19 Restrictions Weight Bearing Restrictions: Yes LLE Weight Bearing: Weight bearing as tolerated      Mobility Bed Mobility Overal bed mobility: Needs Assistance Bed Mobility: Rolling, Supine to Sit, Sit to Supine Rolling: Max assist   Supine to sit: +2 for physical assistance, Max assist Sit to supine: +2 for physical assistance, Max assist   General bed mobility comments: pt requires pad use for pivot toward EOB. once pt rising initiates with trunk and completes sitting task. pt with R lateral lean notes. pt helping control trunk to bed surface. Ot attempting to adjust a sheet and pt elevated trunk off bed surface to (A) with task. Pt requires total (A) for bil LE on and off bed surface with grimace and L UE response to movement of L LE    Transfers                   General transfer comment: NT      Balance Overall balance assessment: Needs assistance Sitting-balance support: Single extremity supported, Feet unsupported Sitting balance-Leahy Scale: Zero   Postural control: Right lateral lean                                 ADL either performed or assessed with clinical judgement   ADL Overall ADL's : Needs assistance/impaired Eating/Feeding: NPO   Grooming: Total assistance  Upper Body Bathing: Total assistance   Lower Body Bathing: Total assistance   Upper Body Dressing : Total assistance   Lower Body Dressing: Total assistance     Toilet Transfer Details (indicate cue type and reason): dependent on purewick and rectal pouch           General ADL Comments: pt sitting eob >10 minutes this session     Vision    Additional Comments: noted to have L gaze preference and using L of midline vision to respond to responses. unable to fully assess due to cognition so continue to test functionally     Perception     Praxis      Pertinent Vitals/Pain Pain Assessment Pain Assessment: Faces Faces Pain Scale: Hurts even more Pain Location: L LE movement Pain Descriptors / Indicators: Grimacing Pain Intervention(s): Monitored during session, Repositioned     Hand Dominance  (unknown at this time. pt using L UE more consistently)   Extremity/Trunk Assessment Upper Extremity Assessment Upper Extremity Assessment: RUE deficits/detail;LUE deficits/detail RUE Deficits / Details: movement noted, does rise against gravity with turn to supine and control descend to bed surface. LUE Deficits / Details: does not follow commands but holding wash cloth and placing it back on PT hands   Lower Extremity Assessment Lower Extremity Assessment: Defer to PT evaluation   Cervical / Trunk Assessment Cervical / Trunk Assessment: Kyphotic   Communication Communication Communication: Other (comment) (intubated)   Cognition Arousal/Alertness: Awake/alert Behavior During Therapy: Flat affect Overall Cognitive Status: Impaired/Different from baseline Area of Impairment: Attention, Following commands                   Current Attention Level: Focused   Following Commands: Follows one step commands inconsistently, Follows one step commands with increased time       General Comments: pt following commands with auditory cue of name call and extremitity in visual field to wiggle toes R LE and squeeze R UE. Pt does not follow command with L UE but using purposeful to pull down gown and to help with balance at EOB. Pt requires name call before all commands with visual attention then a delayed response. Of note pt without psychiatric medications for 1 week     General Comments  VSS, PS CPAP vent oral intubation  PEEP 5 FIO2 30 RR 21    Exercises     Shoulder Instructions      Home Living Family/patient expects to be discharged to:: Unsure                                        Prior Functioning/Environment Prior Level of Function : Independent/Modified Independent                        OT Problem List: Decreased strength;Decreased activity tolerance;Impaired balance (sitting and/or standing);Decreased coordination;Decreased cognition;Decreased safety awareness;Decreased knowledge of use of DME or AE;Decreased knowledge of precautions;Cardiopulmonary status limiting activity;Impaired UE functional use      OT Treatment/Interventions: Self-care/ADL training;Therapeutic exercise;Neuromuscular education;DME and/or AE instruction;Manual therapy;Modalities;Therapeutic activities;Splinting;Cognitive remediation/compensation;Patient/family education;Balance training    OT Goals(Current goals can be found in the care plan section) Acute Rehab OT Goals Patient Stated Goal: none stated due to intubation OT Goal Formulation: Patient unable to participate in goal setting Time For Goal Achievement: 07/06/21 Potential to Achieve Goals: Good  OT Frequency: Min 2X/week  Co-evaluation PT/OT/SLP Co-Evaluation/Treatment: Yes Reason for Co-Treatment: Complexity of the patient's impairments (multi-system involvement);Necessary to address cognition/behavior during functional activity;For patient/therapist safety   OT goals addressed during session: ADL's and self-care;Proper use of Adaptive equipment and DME      AM-PAC OT "6 Clicks" Daily Activity     Outcome Measure Help from another person eating meals?: Total Help from another person taking care of personal grooming?: Total Help from another person toileting, which includes using toliet, bedpan, or urinal?: Total Help from another person bathing (including washing, rinsing, drying)?: Total Help from another person to put on  and taking off regular upper body clothing?: Total Help from another person to put on and taking off regular lower body clothing?: Total 6 Click Score: 6   End of Session Equipment Utilized During Treatment: Oxygen Nurse Communication: Mobility status;Precautions  Activity Tolerance: Patient tolerated treatment well Patient left: in bed;with call bell/phone within reach;with bed alarm set;with nursing/sitter in room;with SCD's reapplied;with restraints reapplied  OT Visit Diagnosis: Unsteadiness on feet (R26.81);Muscle weakness (generalized) (M62.81)                Time: 0109-3235 OT Time Calculation (min): 28 min Charges:  OT General Charges $OT Visit: 1 Visit OT Evaluation $OT Eval Moderate Complexity: 1 Mod   Brynn, OTR/L  Acute Rehabilitation Services Office: [phone] .   Jeri Modena 06/22/2021, 9:58 AM

## 2021-06-22 NOTE — Progress Notes (Signed)
Inpatient Rehab Admissions Coordinator:   Per therapy, patient was screened for CIR candidacy by Clemens Catholic, MS, CCC-SLP . At this time, Pt. is not yet tolerating OOB and is not at a level to tolerate the intensity of CIR.   Pt. may have potential to progress to becoming a potential CIR candidate, so CIR admissions team will follow and monitor for progress and participation with therapies and place consult order if Pt. appears to be an appropriate candidate. Please contact me with any questions.   Clemens Catholic, [place], [facility] Admissions Coordinator  [phone] ([place]) [phone] (office)

## 2021-06-23 ENCOUNTER — Encounter (HOSPITAL_COMMUNITY): Payer: Self-pay | Admitting: Critical Care Medicine

## 2021-06-23 DIAGNOSIS — I63412 Cerebral infarction due to embolism of left middle cerebral artery: Secondary | ICD-10-CM | POA: Diagnosis not present

## 2021-06-23 DIAGNOSIS — N179 Acute kidney failure, unspecified: Secondary | ICD-10-CM | POA: Diagnosis not present

## 2021-06-23 DIAGNOSIS — R4182 Altered mental status, unspecified: Secondary | ICD-10-CM | POA: Diagnosis not present

## 2021-06-23 DIAGNOSIS — G9341 Metabolic encephalopathy: Secondary | ICD-10-CM | POA: Diagnosis not present

## 2021-06-23 DIAGNOSIS — R7881 Bacteremia: Secondary | ICD-10-CM | POA: Diagnosis not present

## 2021-06-23 DIAGNOSIS — I330 Acute and subacute infective endocarditis: Secondary | ICD-10-CM | POA: Diagnosis not present

## 2021-06-23 DIAGNOSIS — I059 Rheumatic mitral valve disease, unspecified: Secondary | ICD-10-CM

## 2021-06-23 DIAGNOSIS — A4101 Sepsis due to Methicillin susceptible Staphylococcus aureus: Secondary | ICD-10-CM | POA: Diagnosis not present

## 2021-06-23 DIAGNOSIS — G9340 Encephalopathy, unspecified: Secondary | ICD-10-CM | POA: Diagnosis not present

## 2021-06-23 DIAGNOSIS — B9561 Methicillin susceptible Staphylococcus aureus infection as the cause of diseases classified elsewhere: Secondary | ICD-10-CM | POA: Diagnosis not present

## 2021-06-23 DIAGNOSIS — R6520 Severe sepsis without septic shock: Secondary | ICD-10-CM | POA: Diagnosis not present

## 2021-06-23 DIAGNOSIS — M00062 Staphylococcal arthritis, left knee: Secondary | ICD-10-CM

## 2021-06-23 DIAGNOSIS — J9601 Acute respiratory failure with hypoxia: Secondary | ICD-10-CM | POA: Diagnosis not present

## 2021-06-23 LAB — CBC WITH DIFFERENTIAL/PLATELET
Abs Immature Granulocytes: 2.15 10*3/uL — ABNORMAL HIGH (ref 0.00–0.07)
Basophils Absolute: 0.1 10*3/uL (ref 0.0–0.1)
Basophils Relative: 0 %
Eosinophils Absolute: 0 10*3/uL (ref 0.0–0.5)
Eosinophils Relative: 0 %
HCT: 29.4 % — ABNORMAL LOW (ref 36.0–46.0)
Hemoglobin: 9.8 g/dL — ABNORMAL LOW (ref 12.0–15.0)
Immature Granulocytes: 11 %
Lymphocytes Relative: 11 %
Lymphs Abs: 2.2 10*3/uL (ref 0.7–4.0)
MCH: 26.5 pg (ref 26.0–34.0)
MCHC: 33.3 g/dL (ref 30.0–36.0)
MCV: 79.5 fL — ABNORMAL LOW (ref 80.0–100.0)
Monocytes Absolute: 1.5 10*3/uL — ABNORMAL HIGH (ref 0.1–1.0)
Monocytes Relative: 8 %
Neutro Abs: 13.6 10*3/uL — ABNORMAL HIGH (ref 1.7–7.7)
Neutrophils Relative %: 70 %
Platelets: 179 10*3/uL (ref 150–400)
RBC: 3.7 MIL/uL — ABNORMAL LOW (ref 3.87–5.11)
RDW: 18.1 % — ABNORMAL HIGH (ref 11.5–15.5)
WBC: 19.5 10*3/uL — ABNORMAL HIGH (ref 4.0–10.5)
nRBC: 0 % (ref 0.0–0.2)

## 2021-06-23 LAB — BASIC METABOLIC PANEL
Anion gap: 9 (ref 5–15)
BUN: 21 mg/dL — ABNORMAL HIGH (ref 6–20)
CO2: 23 mmol/L (ref 22–32)
Calcium: 8.3 mg/dL — ABNORMAL LOW (ref 8.9–10.3)
Chloride: 111 mmol/L (ref 98–111)
Creatinine, Ser: 0.88 mg/dL (ref 0.44–1.00)
GFR, Estimated: >60 mL/min (ref >60)
Glucose, Bld: 96 mg/dL (ref 70–99)
Potassium: 4.4 mmol/L (ref 3.5–5.1)
Sodium: 143 mmol/L (ref 135–145)

## 2021-06-23 LAB — SYNOVIAL CELL COUNT + DIFF, W/ CRYSTALS
Crystals, Fluid: NONE SEEN
Eosinophils-Synovial: 0 % (ref 0–1)
Lymphocytes-Synovial Fld: 6 % (ref 0–20)
Monocyte-Macrophage-Synovial Fluid: 6 % — ABNORMAL LOW (ref 50–90)
Neutrophil, Synovial: 88 % — ABNORMAL HIGH (ref 0–25)
WBC, Synovial: 225000 /mm3 — ABNORMAL HIGH (ref 0–200)

## 2021-06-23 LAB — MAGNESIUM: Magnesium: 1.8 mg/dL (ref 1.7–2.4)

## 2021-06-23 LAB — GLUCOSE, CAPILLARY
Glucose-Capillary: 110 mg/dL — ABNORMAL HIGH (ref 70–99)
Glucose-Capillary: 141 mg/dL — ABNORMAL HIGH (ref 70–99)
Glucose-Capillary: 89 mg/dL (ref 70–99)
Glucose-Capillary: 96 mg/dL (ref 70–99)
Glucose-Capillary: 97 mg/dL (ref 70–99)
Glucose-Capillary: 98 mg/dL (ref 70–99)
Glucose-Capillary: 98 mg/dL (ref 70–99)

## 2021-06-23 LAB — TRIGLYCERIDES: Triglycerides: 157 mg/dL — ABNORMAL HIGH (ref <150)

## 2021-06-23 LAB — PHOSPHORUS: Phosphorus: 3.5 mg/dL (ref 2.5–4.6)

## 2021-06-23 MED ORDER — ENOXAPARIN SODIUM 40 MG/0.4ML IJ SOSY
40.0000 mg | PREFILLED_SYRINGE | INTRAMUSCULAR | Status: DC
  Administered 2021-06-23 – 2021-06-26 (×4): 40 mg via SUBCUTANEOUS
  Filled 2021-06-23 (×4): qty 0.4

## 2021-06-23 MED ORDER — POLYETHYLENE GLYCOL 3350 17 G PO PACK: 17.0000 g | PACK | Freq: Every day | ORAL | Status: DC | PRN

## 2021-06-23 MED ORDER — NUTRISOURCE FIBER PO PACK
1.0000 | PACK | Freq: Two times a day (BID) | ORAL | Status: DC
Start: 2021-06-23 — End: 2021-06-27
  Administered 2021-06-23 – 2021-06-26 (×8): 1
  Filled 2021-06-23 (×8): qty 1

## 2021-06-23 MED ORDER — DOCUSATE SODIUM 50 MG/5ML PO LIQD: 100.0000 mg | Freq: Two times a day (BID) | ORAL | Status: DC | PRN

## 2021-06-23 NOTE — Consult Note (Signed)
Cardiology Consultation:   Patient ID: Debra Klein MRN: [id number]; DOB: 06-Dec-1975  Admit date: 06/19/2021 Date of Consult: 06/23/2021  PCP:  Gaynelle Arabian, MD   [facility] Providers Cardiologist:  New   Patient Profile:   Debra Klein is a 46 y.o. female with a hx of hypertension, hyperlipidemia, schizophrenia who is being seen 06/23/2021 for the evaluation of probable subacute bacterial endocarditis at the request of Noemi Chapel, DO.  History of Present Illness:   Patient presently intubated and sedated.  History obtained through chart.  She was admitted on June 8 with complaints of altered mental status, decreased p.o. intake for 2 weeks, not taking medications and multiple falls.  Felt to be septic and antibiotics initiated.  Patient was intubated in the emergency room.  Patient was subsequently found to have MSSA bacteremia and also CVA.  Echocardiogram showed density on the mitral valve and moderate to severe mitral regurgitation.  MRI consistent with CVA and also septic knee.  Cardiology now asked to evaluate.  Note patient had a cortisone injection into her left knee approximately 2 weeks prior to admission and dental procedure 1 week prior to admission.   Past Medical History:  Diagnosis Date   Hyperlipemia    Hypertension    IDA (iron deficiency anemia)    Schizophrenia (HCC)     No past surgical history on file.    Inpatient Medications: Scheduled Meds:  aspirin  81 mg Per Tube Daily   atorvastatin  40 mg Per Tube Daily   chlorhexidine gluconate (MEDLINE KIT)  15 mL Mouth Rinse BID   Chlorhexidine Gluconate Cloth  6 each Topical Daily   cloZAPine  100 mg Per Tube Daily   enoxaparin (LOVENOX) injection  40 mg Subcutaneous Q24H   feeding supplement (PROSource TF)  45 mL Per Tube TID   fiber  1 packet Per Tube BID   fluPHENAZine  2.5 mg Per Tube QHS   insulin aspart  1-3 Units Subcutaneous Q4H   mouth rinse  15 mL Mouth Rinse 10 times per day   mupirocin  ointment  1 application  Nasal BID   pantoprazole sodium  40 mg Per Tube Daily   vitamin B-12  1,000 mcg Per Tube Daily   Continuous Infusions:  feeding supplement (OSMOLITE 1.5 CAL) 1,000 mL (06/23/21 1205)   nafcillin 12 g in sodium chloride 0.9 % 500 mL continuous infusion 20.8 mL/hr at 06/23/21 0800   PRN Meds: acetaminophen, docusate, fentaNYL (SUBLIMAZE) injection, fentaNYL (SUBLIMAZE) injection, polyethylene glycol, white petrolatum  Allergies:   No Known Allergies  Social History:   Social History   Socioeconomic History   Marital status: Single    Spouse name: Not on file   Number of children: Not on file   Years of education: Not on file   Highest education level: Not on file  Occupational History   Not on file  Tobacco Use   Smoking status: Not on file   Smokeless tobacco: Not on file  Substance and Sexual Activity   Alcohol use: Not on file   Drug use: Not on file   Sexual activity: Not on file  Other Topics Concern   Not on file  Social History Narrative   Not on file   Social Determinants of Health   Financial Resource Strain: Not on file  Food Insecurity: Not on file  Transportation Needs: Not on file  Physical Activity: Not on file  Stress: Not on file  Social Connections: Not on file  Intimate Partner Violence: Not on file    Family History:   Not obtained as patient intubated and sedated.  ROS:  Please see the history of present illness.  Not obtained as patient intubated and sedated.   Physical Exam/Data:   Vitals:   06/23/21 1000 06/23/21 1100 06/23/21 1138 06/23/21 1200  BP: 117/76 112/74  107/70  Pulse: (!) 101  97 97  Resp: _0 Temp:      TempSrc:      SpO2: 100%  100% 100%  Weight:      Height:        Intake/Output Summary (Last 24 hours) at 06/23/2021 1417 Last data filed at 06/23/2021 0800 Gross per 24 hour  Intake 2217.1 ml  Output 1550 ml  Net 667.1 ml      06/23/2021    4:39 AM 06/22/2021    5:00 AM  06/21/2021    5:00 AM  Last 3 Weights  Weight (lbs) 201 lb 1 oz 196 lb 3.4 oz 190 lb 7.6 oz  Weight (kg) 91.2 kg 89 kg 86.4 kg     Body mass index is 32.45 kg/m.  General:  Well nourished, well developed, intubated and sedated HEENT: normal Neck: no JVD Vascular: No carotid bruits; Distal pulses 2+ bilaterally Cardiac:  normal S1, S2; RRR; no murmur  Lungs:  clear to auscultation bilaterally, no wheezing, rhonchi or rales  Abd: soft, nontender, no hepatomegaly  Ext: Trace edema; left knee in brace following aspiration. Skin: warm and dry  Neuro: Intubated and sedated Psych: Intubated and sedated  EKG:  The EKG was personally reviewed and demonstrates: Sinus tachycardia with nonspecific ST changes. Telemetry:  Telemetry was personally reviewed and demonstrates: Sinus   Laboratory Data:   Chemistry Recent Labs  Lab 06/21/21 0256 06/22/21 0335 06/23/21 0620  NA 138 145 143  K 4.2 3.5 4.4  CL 105 110 111  CO2 _1 GLUCOSE 134* 145* 96  BUN 34* 28* 21*  CREATININE 1.12* 0.88 0.88  CALCIUM 8.1* 8.0* 8.3*  MG 2.5* 1.8 1.8  GFRNONAA >60 >60 >60  ANIONGAP _2 Recent Labs  Lab 06/19/21 1341 06/20/21 1003  PROT 7.4 7.0  ALBUMIN 2.1* 1.7*  AST 98* 85*  ALT 77* 63*  ALKPHOS 156* 129*  BILITOT 1.4* 1.6*   Lipids  Recent Labs  Lab 06/20/21 0200 06/20/21 0301 06/23/21 0620  CHOL 161  --   --   TRIG 522*   < > 157*  HDL <10*  --   --   LDLCALC UNABLE TO CALCULATE IF TRIGLYCERIDE OVER 400 mg/dL  --   --   CHOLHDL  UNABLE TO CALCULATE  --   --    < > = values in this interval not displayed.    Hematology Recent Labs  Lab 06/21/21 1537 06/22/21 0335 06/23/21 0620  WBC 16.4* 15.0* 19.5*  RBC 3.58* 3.42* 3.70*  HGB 9.4* 8.9* 9.8*  HCT 29.5* 26.7* 29.4*  MCV 82.4 78.1* 79.5*  MCH 26.3 26.0 26.5  MCHC 31.9 33.3 33.3  RDW 17.2* 17.1* 18.1*  PLT 132* 156 179   Thyroid  Recent Labs  Lab 06/20/21 0015  TSH 0.856     Radiology/Studies:  MR  KNEE LEFT W WO CONTRAST  Result Date: 06/21/2021 CLINICAL DATA:  Left knee pain EXAM: MRI OF THE LEFT KNEE WITHOUT AND WITH CONTRAST TECHNIQUE: Multiplanar, multisequence MR imaging of the left knee was performed both before  and after administration of intravenous contrast. CONTRAST:  8.56m GADAVIST GADOBUTROL 1 MMOL/ML IV SOLN COMPARISON:  Left knee MRI 01/01/2021 FINDINGS: MENISCI Medial: There is intrasubstance degenerative signal without definitive medial meniscus tear. Lateral: Complex degenerative tearing of the anterior horn of the lateral meniscus extending to the horn-body junction. There is substance loss of the macerated appearance of the anterior horn. LIGAMENTS Cruciates: ACL and PCL are intact. Collaterals: Intact MCL with mild thickening proximally, likely related to prior trauma. Lateral collateral ligament complex is intact. CARTILAGE Patellofemoral: Progressive patellofemoral chondrosis with areas of intermediate and high-grade cartilage loss worst along the median patellar ridge. Medial: Progressive, full-thickness cartilage loss along the weight-bearing surfaces. Lateral: Progressive, intermediate grade cartilage loss along the weight-bearing surfaces. JOINT: Small heterogeneous joint effusion with thick synovial enhancement. POPLITEAL FOSSA: Miniscule Baker's cyst. EXTENSOR MECHANISM: Intact quadriceps tendon. Prior patellar tendon repair. There is enlargement of the lobular intraosseous cyst within the proximal tibia, measuring 2.6 x 4.0 x 2.7 cm, previously 3.4 x 2.4 x 1.6 cm (coronal PD image 21, sagittal PD image 24). The inferior margin is also wider measuring up to 1.3 cm in width, previously 0.6 cm (sagittal PD image 22). This intraosseous cystic area demonstrates globular intermediate signal within the superior aspect (series 8, image 20). BONES: New, intense marrow edema within the proximal tibia and to a slightly lesser degree in the fibular head and femoral condyles. Low T1 marrow  signal in the proximal tibia. There is also increased marrow edema in the patella. Other: There is extensive soft tissue swelling along the knee. There are lobular rim enhancing fluid collections along the anterolateral knee, measuring 0.6 cm short axis at the level of the distal quadriceps tendon (series 8, image 6), and up to 1.5 cm short axis along the lateral knee superficial to the attachment of the IT band (series 8, image 15). There are small rim enhancing collections within the popliteus muscle, proximal anterior compartment musculature of the lower leg (series 16, image 26), and distal aspect of the vastus medialis posteriorly (series 16, image 2). 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 Moderate-sized joint effusion with thick synovial enhancement. Superficial rim enhancing fluid collections, likely abscesses, along the anterolateral knee, measurements above. Small developing abscesses within the popliteus muscle, proximal anterior compartment musculature of the lower leg, and distal vastus medialis posteriorly. Enlargement of an intraosseous cyst in the proximal tibia which communicates with the joint via prior patellar tendon repair, which is also likely infected. Progressive tricompartment arthritis, cartilaginous abnormalities as described above. Complex degenerative tear involving the anterior horn of the lateral meniscus extending to the horn-body junction. Electronically Signed   By: JMaurine SimmeringM.D.   On: 06/21/2021 08:01   CT MAXILLOFACIAL W CONTRAST  Result Date: 06/21/2021 CLINICAL DATA:  MSSA bacteremia EXAM: CT MAXILLOFACIAL WITH CONTRAST TECHNIQUE: Multidetector CT imaging of the maxillofacial structures was performed with intravenous contrast. Multiplanar CT image reconstructions were also generated. RADIATION DOSE REDUCTION: This exam was performed according to the departmental dose-optimization program which  includes automated exposure control, adjustment of the mA and/or kV according to patient size and/or use of iterative reconstruction technique. CONTRAST:  895mOMNIPAQUE IOHEXOL 300 MG/ML  SOLN COMPARISON:  None Available. FINDINGS: Osseous: There is a periapical lucency at the root of the most posterior remaining right mandibular tooth. Orbits: Negative. No traumatic or inflammatory finding. Sinuses: Clear. Soft tissues: Negative. Limited intracranial: Recent left insula infarct. IMPRESSION: 1. Recent left insula  infarct. 2. Periapical lucency at the root of the most posterior remaining right mandibular tooth. Electronically Signed   By: Ulyses Jarred M.D.   On: 06/21/2021 00:46   ECHOCARDIOGRAM COMPLETE  Result Date: 06/20/2021    ECHOCARDIOGRAM REPORT   Patient Name:   YANINA KNUPP Date of Exam: 06/20/2021 Medical Rec #:  [id number]      Height:       66.0 in Accession #:    [id number]     Weight:       185.0 lb Date of Birth:  1975/02/13      BSA:          1.934 m Patient Age:    47 years       BP:           112/84 mmHg Patient Gender: F              HR:           84 bpm. Exam Location:  Inpatient Procedure: 2D Echo, 3D Echo, Cardiac Doppler and Color Doppler Indications:     Stroke I63.9 Bacteremia.  History:         Patient has no prior history of Echocardiogram examinations.                  Signs/Symptoms:Bacteremia; Risk Factors:Hypertension and                  Dyslipidemia.  Sonographer:     Roseanna Rainbow RDCS Referring Phys:  [id number] Lorenza Chick Diagnosing Phys: [name]  Sonographer Comments: Technically difficult study due to poor echo windows and echo performed with patient supine and on artificial respirator. Patient supine. IMPRESSIONS  1. Left ventricular ejection fraction, by estimation, is 50%. The left ventricle has mildly decreased function. The left ventricle demonstrates global hypokinesis. Left ventricular diastolic parameters are consistent with Grade I diastolic dysfunction  (impaired relaxation).  2. Right ventricular systolic function is normal. The right ventricular size is normal. There is mildly elevated pulmonary artery systolic pressure. The estimated right ventricular systolic pressure is 33.0 mmHg.  3. Small 10 x 6 mm vegetation noted on the mitral valve. The mitral valve is abnormal. Moderate to severe mitral valve regurgitation not fully visualized due to eccentricity, splay artifact present suggests significant MR. No evidence of mitral stenosis.  4. Tricuspid valve regurgitation is moderate.  5. The aortic valve is tricuspid. Aortic valve regurgitation is not visualized. No aortic stenosis is present.  6. The inferior vena cava is dilated in size with <50% respiratory variability, suggesting right atrial pressure of 15 mmHg.  7. Suggest TEE to assess vegetation and mitral regurgitation. FINDINGS  Left Ventricle: Left ventricular ejection fraction, by estimation, is 50%. The left ventricle has mildly decreased function. The left ventricle demonstrates global hypokinesis. The left ventricular internal cavity size was normal in size. There is no left ventricular hypertrophy. Left ventricular diastolic parameters are consistent with Grade I diastolic dysfunction (impaired relaxation). Right Ventricle: The right ventricular size is normal. No increase in right ventricular wall thickness. Right ventricular systolic function is normal. There is mildly elevated pulmonary artery systolic pressure. The tricuspid regurgitant velocity is 2.39  m/s, and with an assumed right atrial pressure of 15 mmHg, the estimated right ventricular systolic pressure is 07.6 mmHg. Left Atrium: Left atrial size was normal in size. Right Atrium: Right atrial size was normal in size. Pericardium: There is no evidence of pericardial effusion. Mitral Valve: Small 10 x 6 mm  vegetation noted on the mitral valve. The mitral valve is abnormal. Moderate to severe mitral valve regurgitation. No evidence of mitral  valve stenosis. Tricuspid Valve: The tricuspid valve is normal in structure. Tricuspid valve regurgitation is moderate. Aortic Valve: The aortic valve is tricuspid. Aortic valve regurgitation is not visualized. No aortic stenosis is present. Pulmonic Valve: The pulmonic valve was normal in structure. Pulmonic valve regurgitation is trivial. Aorta: The aortic root is normal in size and structure. Venous: The inferior vena cava is dilated in size with less than 50% respiratory variability, suggesting right atrial pressure of 15 mmHg. IAS/Shunts: No atrial level shunt detected by color flow Doppler.  LEFT VENTRICLE PLAX 2D LVIDd:         4.50 cm     Diastology LVIDs:         3.20 cm     LV e' medial:    10.90 cm/s LV PW:         0.80 cm     LV E/e' medial:  8.1 LV IVS:        0.80 cm     LV e' lateral:   6.96 cm/s LVOT diam:     1.80 cm     LV E/e' lateral: 12.7 LV SV:         52 LV SV Index:   27 LVOT Area:     2.54 cm                             3D Volume EF: LV Volumes (MOD)           3D EF:        52 % LV vol d, MOD A2C: 92.2 ml LV EDV:       135 ml LV vol d, MOD A4C: 82.1 ml LV ESV:       65 ml LV vol s, MOD A2C: 47.9 ml LV SV:        70 ml LV vol s, MOD A4C: 37.2 ml LV SV MOD A2C:     44.3 ml LV SV MOD A4C:     82.1 ml LV SV MOD BP:      44.6 ml RIGHT VENTRICLE             IVC RV S prime:     10.20 cm/s  IVC diam: 2.40 cm TAPSE (M-mode): 1.3 cm LEFT ATRIUM           Index        RIGHT ATRIUM          Index LA diam:      2.80 cm 1.45 cm/m   RA Area:     8.48 cm LA Vol (A2C): 34.8 ml 17.99 ml/m  RA Volume:   17.50 ml 9.05 ml/m LA Vol (A4C): 20.0 ml 10.34 ml/m  AORTIC VALVE LVOT Vmax:   117.00 cm/s LVOT Vmean:  78.600 cm/s LVOT VTI:    0.204 m  AORTA Ao Root diam: 2.80 cm Ao Asc diam:  2.90 cm MITRAL VALVE                  TRICUSPID VALVE MV Area (PHT): 4.44 cm       TR Peak grad:   22.8 mmHg MV Decel Time: 171 msec       TR Vmax:        239.00 cm/s MR Peak grad:    100.8 mmHg MR Mean grad:  65.0 mmHg     SHUNTS MR Vmax:         502.00 cm/s  Systemic VTI:  0.20 m MR Vmean:        372.0 cm/s   Systemic Diam: 1.80 cm MR PISA:         1.01 cm MR PISA Eff ROA: 8 mm MR PISA Radius:  0.40 cm MV E velocity: 88.05 cm/s MV A velocity: 109.50 cm/s MV E/A ratio:  0.80 Dalton McleanMD Electronically signed by Franki Monte Signature Date/Time: 06/20/2021/3:18:16 PM    Final (Updated)    Overnight EEG with video  Result Date: 06/20/2021 Lora Havens, MD     06/20/2021  6:30 PM Patient Name: Niva Murren MRN: [id number] Epilepsy Attending: Lora Havens Referring Physician/Provider: Lorenza Chick, MD Duration: 06/20/2021 3419 to 1558  Patient history:  46 year old woman with past medical history of schizophrenia, hypertension, hyperlipidemia, presenting with fever, altered mental status, and found to have multifocal strokes, with an acute change in mental status.  EEG to evaluate for seizure.  Level of alertness: lethargic  AEDs during EEG study: None  Technical aspects: This EEG study was done with scalp electrodes positioned according to the 10-20 International system of electrode placement. Electrical activity was acquired at a sampling rate of 500Hz and reviewed with a high frequency filter of 70Hz and a low frequency filter of 1Hz. EEG data were recorded continuously and digitally stored.  Description: EEG showed continuous generalized predominantly 5 to 7 Hz theta slowing admixed with intermittent generalized 2 to 3 Hz delta slowing.  Hyperventilation and photic stimulation were not performed.    ABNORMALITY - Continuous slow, generalized  IMPRESSION: This study is suggestive of moderate diffuse encephalopathy, nonspecific etiology. No seizures or epileptiform discharges were seen throughout the recording.  Lora Havens   EEG adult  Result Date: 06/20/2021 Lora Havens, MD     06/20/2021  9:56 AM Patient Name: Shristi Scheib MRN: [id number] Epilepsy Attending: Lora Havens Referring  Physician/Provider: Julian Hy, DO Date: 06/19/2021 Duration: 22.59 mins Patient history:  45 year old woman with past medical history of schizophrenia, hypertension, hyperlipidemia, presenting with fever, altered mental status, and found to have multifocal strokes, with an acute change in mental status.  EEG to evaluate for seizure. Level of alertness: lethargic AEDs during EEG study: None Technical aspects: This EEG study was done with scalp electrodes positioned according to the 10-20 International system of electrode placement. Electrical activity was acquired at a sampling rate of 500Hz and reviewed with a high frequency filter of 70Hz and a low frequency filter of 1Hz. EEG data were recorded continuously and digitally stored. Description: EEG showed continuous generalized 3 to 6 Hz theta-delta slowing. Hyperventilation and photic stimulation were not performed.   ABNORMALITY - Continuous slow, generalized IMPRESSION: This study is suggestive of moderate to severe diffuse encephalopathy, nonspecific etiology. No seizures or epileptiform discharges were seen throughout the recording. Priyanka [name]   CT ANGIO HEAD NECK W WO CM W PERF (CODE STROKE)  Result Date: 06/20/2021 CLINICAL DATA:  Altered mental status. EXAM: CT ANGIOGRAPHY HEAD AND NECK CT PERFUSION BRAIN TECHNIQUE: Multidetector CT imaging of the head and neck was performed using the standard protocol during bolus administration of intravenous contrast. Multiplanar CT image reconstructions and MIPs were obtained to evaluate the vascular anatomy. Carotid stenosis measurements (when applicable) are obtained utilizing NASCET criteria, using the distal internal carotid diameter as the denominator. Multiphase CT imaging of the brain was performed  following IV bolus contrast injection. Subsequent parametric perfusion maps were calculated using RAPID software. RADIATION DOSE REDUCTION: This exam was performed according to the departmental  dose-optimization program which includes automated exposure control, adjustment of the mA and/or kV according to patient size and/or use of iterative reconstruction technique. CONTRAST:  1110m OMNIPAQUE IOHEXOL 350 MG/ML SOLN COMPARISON:  None Available. FINDINGS: CTA NECK FINDINGS SKELETON: There is no bony spinal canal stenosis. No lytic or blastic lesion. OTHER NECK: Normal pharynx, larynx and major salivary glands. No cervical lymphadenopathy. Unremarkable thyroid gland. UPPER CHEST: No pneumothorax or pleural effusion. No nodules or masses. AORTIC ARCH: There is no calcific atherosclerosis of the aortic arch. There is no aneurysm, dissection or hemodynamically significant stenosis of the visualized portion of the aorta. Conventional 3 vessel aortic branching pattern. The visualized proximal subclavian arteries are widely patent. RIGHT CAROTID SYSTEM: No dissection, occlusion or aneurysm. Minimal atherosclerotic calcification at the carotid bifurcation without hemodynamically significant stenosis. LEFT CAROTID SYSTEM: Normal without aneurysm, dissection or stenosis. VERTEBRAL ARTERIES: Left dominant configuration. Both origins are clearly patent. There is no dissection, occlusion or flow-limiting stenosis to the skull base (V1-V3 segments). CTA HEAD FINDINGS POSTERIOR CIRCULATION: --Vertebral arteries: Normal V4 segments. --Inferior cerebellar arteries: Normal. --Basilar artery: Normal. --Superior cerebellar arteries: Normal. --Posterior cerebral arteries (PCA): Normal. ANTERIOR CIRCULATION: --Intracranial internal carotid arteries: Normal. --Anterior cerebral arteries (ACA): Normal. Both A1 segments are present. Patent anterior communicating artery (a-comm). --Middle cerebral arteries (MCA): There is occlusion of a left MCA M2 branch in the insula (series 8, image 107). The M1 segment is patent without stenosis. Right MCA is normal. VENOUS SINUSES: As permitted by contrast timing, patent. ANATOMIC VARIANTS:  None Review of the MIP images confirms the above findings. CT Brain Perfusion Findings: ASPECTS: 8 at 9:40 p.m. on 06/19/2021 CBF (<30%) Volume: 042mPerfusion (Tmax>6.0s) volume: 1580mismatch Volume: 30m69mfarction Location:No infarction by CT perfusion analysis. However, this is likely artifactual, because there is completed infarction demonstrated on the noncontrast CT and the MRI, which often confounds perfusion data. IMPRESSION: 1. Occlusion of a left MCA M2 branch in the insula. No other intracranial arterial occlusion or high-grade stenosis. 2. CT perfusion does not show a significant region of penumbra. 3. No embolic source identified within the neck. Echocardiogram may be helpful to assess for potential cardiac embolic source. Electronically Signed   By: KeviUlyses Jarred.   On: 06/20/2021 03:04   MR BRAIN WO CONTRAST  Result Date: 06/20/2021 CLINICAL DATA:  Acute neurologic deficit EXAM: MRI HEAD WITHOUT CONTRAST TECHNIQUE: Multiplanar, multiecho pulse sequences of the brain and surrounding structures were obtained without intravenous contrast. COMPARISON:  None Available. FINDINGS: Brain: There is multifocal acute ischemia, predominantly a left MCA territory large cortical infarct. There are scattered punctate foci of acute ischemia within the right hemisphere and in the left occipital lobe. No acute or chronic hemorrhage. There is multifocal hyperintense T2-weighted signal within the white matter. Parenchymal volume and CSF spaces are normal. The midline structures are normal. Vascular: Major flow voids are preserved. Skull and upper cervical spine: Normal calvarium and skull base. Visualized upper cervical spine and soft tissues are normal. Sinuses/Orbits:No paranasal sinus fluid levels or advanced mucosal thickening. No mastoid or middle ear effusion. Normal orbits. IMPRESSION: 1. Large left MCA territory cortical infarct. No hemorrhage or mass effect. 2. Scattered punctate foci of acute ischemia  within the right hemisphere and left occipital lobe. Electronically Signed   By: KeviUlyses Jarred.   On: 06/20/2021 00:10  CT Head Wo Contrast  Result Date: 06/19/2021 CLINICAL DATA:  Altered mental status EXAM: CT HEAD WITHOUT CONTRAST TECHNIQUE: Contiguous axial images were obtained from the base of the skull through the vertex without intravenous contrast. RADIATION DOSE REDUCTION: This exam was performed according to the departmental dose-optimization program which includes automated exposure control, adjustment of the mA and/or kV according to patient size and/or use of iterative reconstruction technique. COMPARISON:  Head CT 06/19/2021 FINDINGS: Brain: There is no evidence of acute intracranial hemorrhage. There is loss of gray-white matter differentiation along the left insula (series 2, images 12-15).The basal cisterns are patent.The ventricles are normal in size. Vascular: No hyperdense vessel or unexpected calcification. Skull: Normal. Negative for fracture or focal lesion. Sinuses/Orbits: There is frothy material in the nasal cavities. Mild ethmoid air cell mucosal thickening. Other: None. IMPRESSION: Loss of gray-white matter differentiation along the left insula, concerning for infarction. Recommend CTA head/neck and/or brain MRI. Critical Value/emergent results were called by telephone at the time of interpretation on 06/19/2021 at 10:07 pm to provider Carmin Muskrat , who verbally acknowledged these results. Electronically Signed   By: Maurine Simmering M.D.   On: 06/19/2021 22:09   DG Chest Portable 1 View  Result Date: 06/19/2021 CLINICAL DATA:  post intubation; OG tube placement EXAM: PORTABLE CHEST 1 VIEW COMPARISON:  None Available. FINDINGS: Endotracheal tube approximately 3-4 cm above the carina. Enteric tube coursing below the hemidiaphragm with tip and side port overlying the expected region of the gastric lumen. Lines and tubes overlie the abdomen. The heart and mediastinal contours are  within normal limits. No focal consolidation. No pulmonary edema. No pleural effusion. No pneumothorax. Nonobstructive bowel gas pattern. Stool noted throughout the visualized colon. No acute osseous abnormality. IMPRESSION: 1. No active cardiopulmonary disease. 2. Nonobstructive bowel gas pattern. 3. Lines and tubes in good position. Electronically Signed   By: Iven Finn M.D.   On: 06/19/2021 21:29   DG Abdomen 1 View  Result Date: 06/19/2021 CLINICAL DATA:  post intubation; OG tube placement EXAM: PORTABLE CHEST 1 VIEW COMPARISON:  None Available. FINDINGS: Endotracheal tube approximately 3-4 cm above the carina. Enteric tube coursing below the hemidiaphragm with tip and side port overlying the expected region of the gastric lumen. Lines and tubes overlie the abdomen. The heart and mediastinal contours are within normal limits. No focal consolidation. No pulmonary edema. No pleural effusion. No pneumothorax. Nonobstructive bowel gas pattern. Stool noted throughout the visualized colon. No acute osseous abnormality. IMPRESSION: 1. No active cardiopulmonary disease. 2. Nonobstructive bowel gas pattern. 3. Lines and tubes in good position. Electronically Signed   By: Iven Finn M.D.   On: 06/19/2021 21:29   US Abdomen Limited RUQ (LIVER/GB)  Result Date: 06/19/2021 CLINICAL DATA:  [id number] elevated LFTs EXAM: ULTRASOUND ABDOMEN LIMITED RIGHT UPPER QUADRANT COMPARISON:  None Available. FINDINGS: Gallbladder: No gallstones or wall thickening visualized. Gallbladder wall measures 2.7 mm without pericholecystic fluid. No sonographic Murphy sign noted by sonographer. Common bile duct: Diameter: 2.7 mm Liver: No focal lesion identified. Within normal limits in parenchymal echogenicity. Portal vein is patent on color Doppler imaging with normal direction of blood flow towards the liver. Other: None. IMPRESSION: Unremarkable limited ultrasound imaging of the right upper quadrant of the abdomen. Electronically  Signed   By: Frazier Richards M.D.   On: 06/19/2021 16:05   CT Cervical Spine Wo Contrast  Result Date: 06/19/2021 CLINICAL DATA:  Neck trauma, intoxicated or obtunded (Age >= 16y) EXAM: CT CERVICAL SPINE WITHOUT  CONTRAST TECHNIQUE: Multidetector CT imaging of the cervical spine was performed without intravenous contrast. Multiplanar CT image reconstructions were also generated. RADIATION DOSE REDUCTION: This exam was performed according to the departmental dose-optimization program which includes automated exposure control, adjustment of the mA and/or kV according to patient size and/or use of iterative reconstruction technique. COMPARISON:  None Available. FINDINGS: Alignment: Preserved. Skull base and vertebrae: Vertebral body heights are maintained. No acute fracture. Soft tissues and spinal canal: No prevertebral fluid or swelling. No visible canal hematoma. Disc levels:  Intervertebral disc heights are maintained. Upper chest: Included lung apices are clear. Other: Trace calcified plaque at the right common carotid bifurcation. IMPRESSION: No acute fracture. Electronically Signed   By: Macy Mis M.D.   On: 06/19/2021 14:59   CT Head Wo Contrast  Result Date: 06/19/2021 CLINICAL DATA:  Mental status change, unknown cause EXAM: CT HEAD WITHOUT CONTRAST TECHNIQUE: Contiguous axial images were obtained from the base of the skull through the vertex without intravenous contrast. RADIATION DOSE REDUCTION: This exam was performed according to the departmental dose-optimization program which includes automated exposure control, adjustment of the mA and/or kV according to patient size and/or use of iterative reconstruction technique. COMPARISON:  None Available. FINDINGS: Brain: There is no acute intracranial hemorrhage, mass effect, or edema. Gray-white differentiation is preserved. There is no extra-axial fluid collection. Ventricles and sulci are within normal limits in size and configuration. Vascular: No  hyperdense vessel or unexpected calcification. Skull: Calvarium is unremarkable. Sinuses/Orbits: No acute finding. Other: None. IMPRESSION: No acute intracranial abnormality. Electronically Signed   By: Macy Mis M.D.   On: 06/19/2021 14:57     Assessment and Plan:   Mitral valve MSSA endocarditis-I have personally reviewed the patient's transthoracic echocardiogram.  There is probable mobile density on the mitral valve with mitral regurgitation that is difficult to quantitate due to eccentric nature of jet.  However agree likely moderate to severe.  We will arrange transesophageal echocardiogram to better assess mitral valve vegetation, severity of mitral regurgitation and rule out abscess.  Continue nafcillin.  Given recent CVA and septic knee/abscess she would not be a good surgical candidate at present. Status post CVA-likely embolic from endocarditis. Septic knee/abscess-she is status post left knee aspiration.  Await cultures.  Orthopedics following.  Note she was given a cortisone shot in her left knee approximately 2 weeks prior to admission and this is felt possibly the source of her MSSA. Acute respiratory failure requiring mechanical ventilation-Per CCM. Schizophrenia History of hypertension-patient's blood pressure is controlled.      For questions or updates, please contact [place] Please consult [email or web address] for contact info under    Signed, [name], MD  06/23/2021 2:17 PM

## 2021-06-23 NOTE — Procedures (Signed)
Procedure: Left knee aspiration and injection   Indication: Left knee effusion(s)   Surgeon: Silvestre Gunner, PA-C   Assist: None   Anesthesia: None (Pt sedated)   EBL: None   Complications: None   Findings: The left knee was sterilely prepped and aspirated. 33ml cloudly yellow fluid obtained. Pt tolerated the procedure well.       Lisette Abu, PA-C Orthopedic Surgery [phone]

## 2021-06-23 NOTE — Progress Notes (Signed)
NAME:  Debra Klein, MRN:  [id number], DOB:  Jun 30, 1975, LOS: 4 ADMISSION DATE:  06/19/2021, CONSULTATION DATE:  06/19/21 REFERRING MD:  Laurena Spies, CHIEF COMPLAINT:  respiratory failure   History of Present Illness:  Debra Klein is a 46 year old woman with a history of schizophrenia who presented to the hospital today with acute encephalopathy. History obtained through chart review:   Family reports that about 3 weeks ago the patient started to be less verbal, but was still managing her medications. They were noticing for the last few days they would find a pill or 2 dropped on the floor although she was still overall taking her medications.  Her verbal output was actually improving in the last week or so, but then acutely today she was completely nonverbal and so they came to the ED for further evaluation.  They report she did not have any other complaints and did not notice any fevers or any physical issues other than left leg swelling and pain   Per chart review, she stopped taking her medications approximately 1 week ago, had a fall on 06/15/2021 with left ankle swelling, became nonverbal on 6/6, and subsequently had another fall on 6/7 found to have a left lower leg fracture and discharged with a cam walker boot after radiographs of her left ankle and tibia/fibula revealed previous intramedullary rodding and rod removal of the left tibia without clear evidence of acute fracture.  Subsequently she fell again on 6/8 and was found to be febrile (101.2), tachycardic to 110s, noted to be nonverbal, not following commands, frequently dozing off but withdrawing in all 4 extremities without a facial droop.  She was started on vancomycin, ceftriaxone, acyclovir for meningitis coverage.  Additionally, per hospitalist admission note LP was attempted but unsuccessful at bedside.  Subsequently at approximately 9 PM the patient had a change in mental status (drooling) with gaze deviation to the left and was  intubated. PCCM consulted. Head CT demonstrating an insular hypodensity concerning for MCA infarct.   Pertinent  Medical History  Schizophrenia HTN anemia  Significant Hospital Events: Including procedures, antibiotic start and stop dates in addition to other pertinent events   6/7: Fall --> fibula fracture, Minimally verbal at an outpatient visit with [facility]  6/8: Presented to the ED nonverbal with altered mentation. Progressively worsening mentation with requirement of intubation for airway protection.  Empirically treated for sepsis, presumed meningitis. MRI with large MCA infarct and punctate right hemisphere infarcts.  6/9: Blood cultures obtained on arrival positive for MSSA  6/10 MRI knee demonstrates septic arthritis.   Interim History / Subjective:  Fentanyl remains off. Follows minimal commands.  Yesterday not able to be extubated due to somnolence after psychiatric meds were administered.  Objective   Blood pressure 109/82, pulse (!) 103, temperature 99.4 F (37.4 C), temperature source Axillary, resp. rate 20, height 5\' 6"  (1.676 m), weight 91.2 kg, SpO2 100 %.    Vent Mode: CPAP;PSV FiO2 (%):  [30 %] 30 % Set Rate:  [14 bmp] 14 bmp Vt Set:  [470 mL] 470 mL PEEP:  [5 cmH20] 5 cmH20 Pressure Support:  [5 cmH20] 5 cmH20 Plateau Pressure:  [13 cmH20-22 cmH20] 22 cmH20   Intake/Output Summary (Last 24 hours) at 06/23/2021 0748 Last data filed at 06/23/2021 0500 Gross per 24 hour  Intake 2839.77 ml  Output 1350 ml  Net 1489.77 ml    Filed Weights   06/21/21 0500 06/22/21 0500 06/23/21 0439  Weight: 86.4 kg 89 kg 91.2 kg  Examination: General: critically ill appearing woman lying in bed in NAD HENT: [place]/abdominal tenderness, eyes anicteric, ETT Lungs: faint rhonchi, mild thick clear secretions from ETT. Synchronous with MV. Cardiovascular: S1S2, RRR Abdomen: soft, NT  Extremities: boot LLE, mild RLE edema Neuro:  awake, follows command to squeeze fingers in LUE,  otherwise not following commands. No observed spontaneous swallowing. Derm: no diffuse rashes, warm & dry  WBC 19.5 H/H 9.8/29.4 BG 80-140s  Assessment & Plan:  Acute respiratory failure with hypoxia requiring mechanical ventilation - LTVV - PAD protocol for sedation-on sedation currently off - VAP prevention protocol - Continue daily SAT and SBT.  With her slow response to following commands, aphasia, lack of observed spontaneous swallowing I am concerned that she may not protect her airway postextubation.  Need to discuss with family if they would want tracheostomy if she requires reintubation.  Cerebrovascular accident due to embolic occlusion of left middle cerebral artery - secondary to septic embolization from Staph aureus bacteremia -Presented outside the window for any cerebrovascular intervention - Continue nafcillin, 6 weeks of therapy - PT, OT, SLP needed - Anticipate prolonged rehabilitation requirements -No longer requires statin for secondary stroke prevention  Sepsis due to MSSA bacteremia, septic arthritis, MV endocarditis -nafcillin -may need operative intervention on knee  AKI due to sepsis from Staph aureus bacteremia, resolved -Continue to monitor renal function periodically.  Schizophrenia, prominent negative symptoms versus stroke symptoms -Continue PTA clozapine  Acute metabolic encephalopathy due to sepsis and stroke, may also have prominent negative symptoms from schizophrenia -Continue to hold sedation - Continue clozapine & fluphenazine for schizophrenia -Neuroprotective measures  Bacteremia due to MSSA bacteremia and MV endocarditis, knee septic arthritis -Nafcillin x6 weeks -Has not been evaluated by cardiothoracic surgery; will consult today. -Needs TEE, will consult cardiology  Septic arthritis of L knee -Continue antibiotics - Consult orthopedics today  Hypergylcemia -SSI PRN -goal BG 140-180  Best Practice (right click and "Reselect  all SmartList Selections" daily)   Diet/type: tubefeeds DVT prophylaxis: LMWH GI prophylaxis: PPI Lines: N/A Foley:  N/A Code Status:  full code Last date of multidisciplinary goals of care discussion [Mother and sister updated at bedside on 6/9]  This patient is critically ill with multiple organ system failure which requires frequent high complexity decision making, assessment, support, evaluation, and titration of therapies. This was completed through the application of advanced monitoring technologies and extensive interpretation of multiple databases. During this encounter critical care time was devoted to patient care services described in this note for 65 minutes.  Julian Hy, DO 06/23/21 9:21 AM [facility]

## 2021-06-23 NOTE — Progress Notes (Addendum)
STROKE TEAM PROGRESS NOTE   INTERVAL HISTORY Patient laying in bed awake, tracks examiner. Inconsistent with following commands. No sedation, no family at bedside. Trial extubation today. Consider consulting cardiology   Vitals:   06/23/21 0439 06/23/21 0700 06/23/21 0744 06/23/21 0800  BP:  116/73  121/77  Pulse:  96 (!) 103 (!) 103  Resp:  14 20 18   Temp:    99.7 F (37.6 C)  TempSrc:    Axillary  SpO2:  100% 100% 100%  Weight: 91.2 kg     Height:       CBC:  Recent Labs  Lab 06/22/21 0335 06/23/21 0620  WBC 15.0* 19.5*  NEUTROABS 12.3* PENDING  HGB 8.9* 9.8*  HCT 26.7* 29.4*  MCV 78.1* 79.5*  PLT 156 992    Basic Metabolic Panel:  Recent Labs  Lab 06/21/21 0256 06/22/21 0335 06/23/21 0620  NA 138 145  --   K 4.2 3.5  --   CL 105 110  --   CO2 23 25  --   GLUCOSE 134* 145*  --   BUN 34* 28*  --   CREATININE 1.12* 0.88  --   CALCIUM 8.1* 8.0*  --   MG 2.5* 1.8 1.8  PHOS 4.3 2.9 3.5    Lipid Panel:  Recent Labs  Lab 06/20/21 0200 06/20/21 0301 06/23/21 0620  CHOL 161  --   --   TRIG 522*   < > 157*  HDL <10*  --   --   CHOLHDL  UNABLE TO CALCULATE  --   --   VLDL UNABLE TO CALCULATE  --   --   LDLCALC UNABLE TO CALCULATE IF TRIGLYCERIDE OVER 400 mg/dL  --   --    < > = values in this interval not displayed.    HgbA1c:  Recent Labs  Lab 06/20/21 0015  HGBA1C 6.5*    Urine Drug Screen:  Recent Labs  Lab 06/19/21 1330  LABOPIA NONE DETECTED  COCAINSCRNUR NONE DETECTED  LABBENZ NONE DETECTED  AMPHETMU NONE DETECTED  THCU NONE DETECTED  LABBARB NONE DETECTED     Alcohol Level  Recent Labs  Lab 06/19/21 1341  ETH <10     IMAGING past 24 hours No results found.  PHYSICAL EXAM General:  Intubated, well-nourished, well-developed patient in no acute distress.  Patient is intubated she has a left knee brace. Respiratory:  breathing over the vent. Neurological : She is intubated.  Off sedation.  Opens eyes, will track examiner.   Grimaces to pain. Left gaze deviation.  Globally aphasic.  Inconsistently follows commands.  Does not blink to threat on either side.:  Pupils equal and reactive, positive corneal, cough and gag reflexes, moves right upper extremity and lower extremity  to noxious stimuli.  She is able to left left arm antigravity. Debra Klein of both mute  ASSESSMENT/PLAN [name] is a 46 y.o. female with history of schizophrenia, HTN and HLD presenting with AMS, fever and acute onset left gaze deviation.  She may have had seizure activity at this time, cEEG ongoing.  She was intubated for airway protection.  CT head was concerning for left sided stroke, and CTA revealed left MCA M2 branch occlusion.  MRI brain reveals large left MCA territory infarct and scattered acute infarcts in right hemisphere and left occipital lobe.  Stroke: Left MCA infarct and small scattered acute infarcts likely secondary due to embolism from  bacterial endocarditis CT head Loss of gray-white matter differentiation  along left insula CTA head & neck occlusion of left MCA M2 branch in insula.  No other intracranial arterial occlusion or high grade stenosis MRI  large left MCA territory cortical infarct, scattered punctate foci of acute ischemia in right hemisphere and left occipital lobe 2D Echo Ef 50%. Mitral Valve: Small 10 x 6 mm vegetation noted on the mitral valve. The mitral valve is abnormal. Moderate to severe mitral valve regurgitation.  No evidence of mitral valve stenosis. Will need TEE LDL direct 27.7 HgbA1c 6.5 VTE prophylaxis - lovenox     Diet   Diet NPO time specified   No antithrombotic prior to admission, now on aspirin 81 mg daily.  Therapy recommendations: CLR  disposition:  pending  Possible seizures Patient had possible seizure on 6/8 with new onset left gaze deviation and altered mental status EEG d/c'd. No seizures identified  Hypertension Home meds:  none Stable Permissive hypertension (OK if  < 220/120) but gradually normalize in 5-7 days Long-term BP goal normotensive  Hyperlipidemia Home meds:  atorvastatin 20 mg daily, increased to 40 mg daily LDL 27.7, goal < 70 Continue statin at discharge  Diabetes type II Controlled Home meds:  none HgbA1c 6.5, goal < 7.0 CBGs Recent Labs    06/23/21 0001 06/23/21 0406 06/23/21 0754  GLUCAP 141* 97 98     SSI  Schizophrenia Patient has history of schizophrenia Had stopped taking medications about a week prior to admission and had become nonverbal Consult psychiatry when patient extubated  Acute Hypoxic Respiratory Failure Patient was intubated for airway protection Ventilator management per CCM  Other Stroke Risk Factors none  Other Active Problems Sepsis bacteremia MV endocarditis  Hospital day # Shipman DNP, ACNPC-AG    ATTENDING ATTESTATION:  46 year old female with M2 MCA stroke from cardio embolus with vegetation on heart valve.  Infectious disease following they recommend continuing nafcillin.  Trial of extubation today by CCM if she does not pass then will need a trach.   will consult cardiology for valvular vegetation after TEE is completed.  ADDENDUM: seen by ortho considering I/D of septic knee. Cardiology does not think vegetations are surgical.   Dr. Reeves Forth evaluated pt independently, reviewed imaging, chart, labs. Discussed and formulated plan with the APP. Please see APP note above for details.     This patient is critically ill due to respiratory distress, endocarditis, stroke  and at significant risk of neurological worsening, death form heart failure, respiratory failure, recurrent stroke, bleeding from [facility], seizure, sepsis. This patient's care requires constant monitoring of vital signs, hemodynamics, respiratory and cardiac monitoring, review of multiple databases, neurological assessment, discussion with family, other specialists and medical decision making of high complexity. I spent 35  minutes of neurocritical care time in the care of this patient.   Kendrik Mcshan,MD    To contact Stroke Continuity provider, please refer to [email or web address] After hours, contact General Neurology

## 2021-06-23 NOTE — Progress Notes (Signed)
Patient ID: Debra Klein, female   DOB: [birth year], 46 y.o.   MRN: [id number] The patient is somewhat of seen in the past.  In November of this past year we did place a steroid injection in her left knee due to pain from arthritis.  She has a remote history of a significant fracture to her left lower extremity.  This was an open fracture and eventually required a surgical intervention by other orthopedic surgeons in town again years ago.  That hardware had to eventually be removed.  When I saw her if she had developed obvious posttraumatic arthritis in the knee.  A MRI also confirmed those findings.  She does have other comorbidities and some mental health issues.  She was brought to our office in late May and another steroid injection was placed in her left knee.  She was seen in our office by one of the physician assistants last week after a hard mechanical fall in which she sustained an abrasion to her left knee.  She is also placed in a cam walking boot.  She was recently admitted secondary to sepsis and had to be intubated.  She was noted to have a large left knee joint effusion and orthopedic surgery was consulted today.  Hilbert Odor, PA-C performed the aspiration of that knee and it does show a significantly high white blood cell count.  I came by the bedside to check on her and talk to her mother who is also a patient of mine.  Apparently she is having a TEE tomorrow by cardiology due to possible endocarditis.  She is also bacteremic.  She still does have some swelling of that left knee.  I tried to aspirate more fluid from the knee and got a small amount.  I talked to her mother in length about an arthroscopic I&D with washout of that left knee.  I do feel it appropriate to put her on the operating room schedule tomorrow evening after she has her TEE in order to washout that knee.  Given the significant arthritis that she already has in her knee, this does not have to be an emergent surgery but I do  feel it is appropriate to washout her knee arthroscopically.  I talked to her mom at the bedside in detail about the surgery and described the risk and benefits of surgery and talked about the rationale and reasoning behind proceeding with knee arthroscopy.  We will put her on the schedule for late tomorrow.

## 2021-06-23 NOTE — Progress Notes (Signed)
   [facility] has been requested to perform a transesophageal echocardiogram on Debra Klein for endocarditis.  After careful review of history and examination, the risks and benefits of transesophageal echocardiogram have been explained including risks of esophageal damage, perforation (1:10,000 risk), bleeding, pharyngeal hematoma as well as other potential complications associated with conscious sedation including aspiration, arrhythmia, respiratory failure and death. Alternatives to treatment were discussed, questions were answered.   Patient is currently intubated. I called and spoke with patient's mother Debra Klein) who is the one who is making medical decisions for patient at this time. Mother was agreeable to proceed with procedure. My colleague Reino Bellis, NP, confirmed that mother was agreeable to this. Consent form signed and will be placed in paper chart.  Plan is for bedside TEE tomorrow around 3pm with Dr. Johney Frame. Notified RN of this plan. I asked tube feeds to be held at midnight.  Darreld Mclean, PA-C 06/23/2021 3:41 PM

## 2021-06-23 NOTE — Progress Notes (Signed)
  Transition of Care [facility]) Screening Note   Patient Details  Name: Debra Klein Date of Birth: 01/15/1975   [facility]) CM/SW Contact:    Benard Halsted, LCSW Phone Number: 06/23/2021, 11:41 AM    Transition of Care Department [facility]) has reviewed patient. We will continue to monitor patient advancement through interdisciplinary progression rounds as therapy needs have been identified for discharge. If new patient transition needs arise, please place a TOC consult.

## 2021-06-23 NOTE — Consult Note (Signed)
Reason for Consult:Left knee effusion Referring Physician: Kipp Brood Time called: 0912 Time at bedside: [place] is an 46 y.o. female.  HPI: Debra Klein was admitted 3d ago with falls and AMS. She was subsequently diagnosed with sepsis and intubated. Prior to admission she had been c/o left leg pain. MRI was obtained which was c/w septic knee arthritis and orthopedic surgery was consulted. She remains intubated and cannot contribute to history.  Past Medical History:  Diagnosis Date   Hyperlipemia    Hypertension    IDA (iron deficiency anemia)    Schizophrenia (HCC)     No past surgical history on file.  No family history on file.  Social History:  has no history on file for tobacco use, alcohol use, and drug use.  Allergies: No Known Allergies  Medications: I have reviewed the patient's current medications.  Results for orders placed or performed during the hospital encounter of 06/19/21 (from the past 48 hour(s))  Glucose, capillary     Status: Abnormal   Collection Time: 06/21/21 12:02 PM  Result Value Ref Range   Glucose-Capillary 149 (H) 70 - 99 mg/dL    Comment: Glucose reference range applies only to samples taken after fasting for at least 8 hours.  Glucose, capillary     Status: Abnormal   Collection Time: 06/21/21  3:24 PM  Result Value Ref Range   Glucose-Capillary 142 (H) 70 - 99 mg/dL    Comment: Glucose reference range applies only to samples taken after fasting for at least 8 hours.  CBC     Status: Abnormal   Collection Time: 06/21/21  3:37 PM  Result Value Ref Range   WBC 16.4 (H) 4.0 - 10.5 K/uL   RBC 3.58 (L) 3.87 - 5.11 MIL/uL   Hemoglobin 9.4 (L) 12.0 - 15.0 g/dL    Comment: REPEATED TO VERIFY POST TRANSFUSION SPECIMEN    HCT 29.5 (L) 36.0 - 46.0 %   MCV 82.4 80.0 - 100.0 fL    Comment: POST TRANSFUSION SPECIMEN REPEATED TO VERIFY DELTA CHECK NOTED    MCH 26.3 26.0 - 34.0 pg   MCHC 31.9 30.0 - 36.0 g/dL   RDW 17.2 (H) 11.5 -  15.5 %   Platelets 132 (L) 150 - 400 K/uL   nRBC 0.0 0.0 - 0.2 %    Comment: Performed at [facility], [place] [address] [place], [place] [address]  Glucose, capillary     Status: Abnormal   Collection Time: 06/21/21  7:48 PM  Result Value Ref Range   Glucose-Capillary 150 (H) 70 - 99 mg/dL    Comment: Glucose reference range applies only to samples taken after fasting for at least 8 hours.  Glucose, capillary     Status: Abnormal   Collection Time: 06/21/21 11:19 PM  Result Value Ref Range   Glucose-Capillary 158 (H) 70 - 99 mg/dL    Comment: Glucose reference range applies only to samples taken after fasting for at least 8 hours.  Glucose, capillary     Status: Abnormal   Collection Time: 06/22/21  3:17 AM  Result Value Ref Range   Glucose-Capillary 147 (H) 70 - 99 mg/dL    Comment: Glucose reference range applies only to samples taken after fasting for at least 8 hours.  Basic metabolic panel     Status: Abnormal   Collection Time: 06/22/21  3:35 AM  Result Value Ref Range   Sodium 145 135 - 145 mmol/L    Comment:  DELTA CHECK NOTED   Potassium 3.5 3.5 - 5.1 mmol/L   Chloride 110 98 - 111 mmol/L   CO2 25 22 - 32 mmol/L   Glucose, Bld 145 (H) 70 - 99 mg/dL    Comment: Glucose reference range applies only to samples taken after fasting for at least 8 hours.   BUN 28 (H) 6 - 20 mg/dL   Creatinine, Ser 0.88 0.44 - 1.00 mg/dL   Calcium 8.0 (L) 8.9 - 10.3 mg/dL   GFR, Estimated >60 >60 mL/min    Comment: (NOTE) Calculated using the CKD-EPI Creatinine Equation (2021)    Anion gap 10 5 - 15    Comment: Performed at [facility] [address], [place], [place] [address]  Magnesium     Status: None   Collection Time: 06/22/21  3:35 AM  Result Value Ref Range   Magnesium 1.8 1.7 - 2.4 mg/dL    Comment: Performed at [facility] [address], [place], [place] [address]  Phosphorus     Status: None   Collection Time: 06/22/21  3:35 AM  Result Value Ref Range    Phosphorus 2.9 2.5 - 4.6 mg/dL    Comment: Performed at [facility] [address] [place], [place] [address]  CBC with Differential/Platelet     Status: Abnormal   Collection Time: 06/22/21  3:35 AM  Result Value Ref Range   WBC 15.0 (H) 4.0 - 10.5 K/uL   RBC 3.42 (L) 3.87 - 5.11 MIL/uL   Hemoglobin 8.9 (L) 12.0 - 15.0 g/dL   HCT 26.7 (L) 36.0 - 46.0 %   MCV 78.1 (L) 80.0 - 100.0 fL   MCH 26.0 26.0 - 34.0 pg   MCHC 33.3 30.0 - 36.0 g/dL   RDW 17.1 (H) 11.5 - 15.5 %   Platelets 156 150 - 400 K/uL   nRBC 0.0 0.0 - 0.2 %   Neutrophils Relative % 82 %   Neutro Abs 12.3 (H) 1.7 - 7.7 K/uL   Lymphocytes Relative 12 %   Lymphs Abs 1.8 0.7 - 4.0 K/uL   Monocytes Relative 6 %   Monocytes Absolute 0.9 0.1 - 1.0 K/uL   Eosinophils Relative 0 %   Eosinophils Absolute 0.0 0.0 - 0.5 K/uL   Basophils Relative 0 %   Basophils Absolute 0.0 0.0 - 0.1 K/uL   nRBC 0 0 /100 WBC   Abs Immature Granulocytes 0.00 0.00 - 0.07 K/uL    Comment: Performed at [facility], [place] [address] [place], [place] [address]  Occult blood card to lab, stool RN will collect     Status: None   Collection Time: 06/22/21  4:11 AM  Result Value Ref Range   Fecal Occult Bld NEGATIVE NEGATIVE    Comment: Performed at [facility] [address] [place], [place] [address]  Glucose, capillary     Status: Abnormal   Collection Time: 06/22/21  7:44 AM  Result Value Ref Range   Glucose-Capillary 132 (H) 70 - 99 mg/dL    Comment: Glucose reference range applies only to samples taken after fasting for at least 8 hours.  Glucose, capillary     Status: None   Collection Time: 06/22/21 11:56 AM  Result Value Ref Range   Glucose-Capillary 94 70 - 99 mg/dL    Comment: Glucose reference range applies only to samples taken after fasting for at least 8 hours.  Glucose, capillary     Status:  None   Collection Time: 06/22/21  3:11 PM  Result Value Ref Range   Glucose-Capillary 86 70 - 99 mg/dL    Comment:  Glucose reference range applies only to samples taken after fasting for at least 8 hours.  Glucose, capillary     Status: Abnormal   Collection Time: 06/22/21  8:14 PM  Result Value Ref Range   Glucose-Capillary 126 (H) 70 - 99 mg/dL    Comment: Glucose reference range applies only to samples taken after fasting for at least 8 hours.  Glucose, capillary     Status: Abnormal   Collection Time: 06/23/21 12:01 AM  Result Value Ref Range   Glucose-Capillary 141 (H) 70 - 99 mg/dL    Comment: Glucose reference range applies only to samples taken after fasting for at least 8 hours.  Glucose, capillary     Status: None   Collection Time: 06/23/21  4:06 AM  Result Value Ref Range   Glucose-Capillary 97 70 - 99 mg/dL    Comment: Glucose reference range applies only to samples taken after fasting for at least 8 hours.  Triglycerides     Status: Abnormal   Collection Time: 06/23/21  6:20 AM  Result Value Ref Range   Triglycerides 157 (H) <150 mg/dL    Comment: Performed at [facility] [address], [place], [place] [address]  Magnesium     Status: None   Collection Time: 06/23/21  6:20 AM  Result Value Ref Range   Magnesium 1.8 1.7 - 2.4 mg/dL    Comment: Performed at [facility] [address] [place], [place] [address]  Phosphorus     Status: None   Collection Time: 06/23/21  6:20 AM  Result Value Ref Range   Phosphorus 3.5 2.5 - 4.6 mg/dL    Comment: Performed at [facility] [address], [place], [place] [address]  CBC with Differential/Platelet     Status: Abnormal   Collection Time: 06/23/21  6:20 AM  Result Value Ref Range   WBC 19.5 (H) 4.0 - 10.5 K/uL   RBC 3.70 (L) 3.87 - 5.11 MIL/uL   Hemoglobin 9.8 (L) 12.0 - 15.0 g/dL   HCT 29.4 (L) 36.0 - 46.0 %   MCV 79.5 (L) 80.0 - 100.0 fL   MCH 26.5 26.0 - 34.0 pg   MCHC 33.3 30.0 - 36.0 g/dL   RDW 18.1 (H) 11.5 - 15.5 %   Platelets 179 150 - 400 K/uL   nRBC 0.0 0.0 - 0.2 %   Neutrophils Relative % 70 %    Neutro Abs 13.6 (H) 1.7 - 7.7 K/uL   Lymphocytes Relative 11 %   Lymphs Abs 2.2 0.7 - 4.0 K/uL   Monocytes Relative 8 %   Monocytes Absolute 1.5 (H) 0.1 - 1.0 K/uL   Eosinophils Relative 0 %   Eosinophils Absolute 0.0 0.0 - 0.5 K/uL   Basophils Relative 0 %   Basophils Absolute 0.1 0.0 - 0.1 K/uL   WBC Morphology      MODERATE LEFT SHIFT (>5% METAS AND MYELOS,OCC PRO NOTED)   RBC Morphology See Note    Smear Review MORPHOLOGY UNREMARKABLE    Immature Granulocytes 11 %   Abs Immature Granulocytes 2.15 (H) 0.00 - 0.07 K/uL   Target Cells PRESENT     Comment: Performed at [facility], [place] [address] [place], [place] [address]  Basic metabolic panel     Status: Abnormal   Collection  Time: 06/23/21  6:20 AM  Result Value Ref Range   Sodium 143 135 - 145 mmol/L   Potassium 4.4 3.5 - 5.1 mmol/L    Comment: DELTA CHECK NOTED   Chloride 111 98 - 111 mmol/L   CO2 23 22 - 32 mmol/L   Glucose, Bld 96 70 - 99 mg/dL    Comment: Glucose reference range applies only to samples taken after fasting for at least 8 hours.   BUN 21 (H) 6 - 20 mg/dL   Creatinine, Ser 0.88 0.44 - 1.00 mg/dL   Calcium 8.3 (L) 8.9 - 10.3 mg/dL   GFR, Estimated >60 >60 mL/min    Comment: (NOTE) Calculated using the CKD-EPI Creatinine Equation (2021)    Anion gap 9 5 - 15    Comment: Performed at [facility] [address], [place], [place] [address]  Glucose, capillary     Status: None   Collection Time: 06/23/21  7:54 AM  Result Value Ref Range   Glucose-Capillary 98 70 - 99 mg/dL    Comment: Glucose reference range applies only to samples taken after fasting for at least 8 hours.    No results found.  Review of Systems  Unable to perform ROS: Intubated   Blood pressure 121/77, pulse (!) 103, temperature 99.7 F (37.6 C), temperature source Axillary, resp. rate 18, height 5\' 6"  (1.676 m), weight 91.2 kg, SpO2 100 %. Physical Exam Constitutional:      General: She is not in acute  distress.    Appearance: She is well-developed. She is not diaphoretic.  HENT:     Head: Normocephalic and atraumatic.  Eyes:     General: No scleral icterus.       Right eye: No discharge.        Left eye: No discharge.     Conjunctiva/sclera: Conjunctivae normal.  Neck:     Comments: Intubated Cardiovascular:     Rate and Rhythm: Regular rhythm. Tachycardia present.  Pulmonary:     Effort: Pulmonary effort is normal. No respiratory distress.  Musculoskeletal:     Comments: LLE No traumatic wounds, ecchymosis, or rash  Mod knee effusion  Knee stable to varus/ valgus and anterior/posterior stress  Sens DPN, SPN, TN could not assess  Motor EHL, ext, flex, evers could not assess  DP 2+, PT 2+, No significant pedal edema  Skin:    General: Skin is warm and dry.  Psychiatric:     Comments: Intubated     Assessment/Plan: Left knee effusion -- Will tap and send for analysis.    Lisette Abu, PA-C Orthopedic Surgery [phone] 06/23/2021, 10:56 AM

## 2021-06-23 NOTE — Progress Notes (Signed)
I updated Ms. Dismuke's sister, mother, and uncle at bedside as well as her cousin who is a Engineer, drilling in [place]. We discussed her ongoing care and the potential need for tracheostomy in the future, should she fail vent weaning due to secretion management. Potentially planning to extubate tomorrow after TEE.  Julian Hy, DO 06/23/21 12:02 PM  Pulmonary & Critical Care

## 2021-06-23 NOTE — Progress Notes (Signed)
[facility] for Infectious Disease  Date of Admission:  06/19/2021   Total days of inpatient antibiotics 4  Principal Problem:   Sepsis ([place]) Active Problems:   Schizophrenia ([facility])   Transaminitis   AKI (acute kidney injury) ([facility])   Bacteremia due to methicillin susceptible Staphylococcus aureus (MSSA)   Toxic encephalopathy   Cerebrovascular accident (CVA) due to embolic occlusion of left middle cerebral artery ([place])   Acute respiratory failure with hypoxia (HCC)   Septic arthritis of knee, left [facility]          Assessment: 46 year old female with schizophrenia admitted with altered mental status.  Intubated in the ED, found to have CVA with MSSA bacteremia.  On arrival, she had temp of 101.2, WBC 15 K.  #MSSA bacteremia with native mitral valve endocarditis with mets to brain and left knee septic arthritis and LLE abscesses #Hx cortisone shot in left knee 2 weeks prior to admission(likely what seeded the bacteremia) #Left Patellar tendon repair-infected #AMS #CVA #Fever #Leukocytosis #Hx of Falls -CTH on 6/8 showed loss of gray white matter alone with insula concerning for infarction -CT on head neck 6/9 showed left MCA branch in the insula. No embolic source in the eck identified -Blood Cx from admission returned + for MSSA. ID engaged -On 6/9: I spoke with mother(whom pt lives with) and sister. PT had not been feeling well(eating less, and not responding to questions at times) for about a week. She had a cortisone shot in left knee about 2 weeks ago. Had dental procedure(filling) one weeks ago. Also noted to be having falls over the last week.  -MRI of the left knee showed periarticular OM most severely affecting th tibia, mild joint effusion,  several abscesses of LLE in popliteus muscle, anterior musculature, anterolateral knee, infected patellar tendon repair. CT face sinus did not show signs of abscess -TTE showed 10 x 56m vegetation on mitral valve, moderate to  sever mitral valve regurgitation.  Recommendations:  -Continue nafcillin -Follow repeat blood Cx form 6/10 to ensure clearance -Obtain TEE, engage CTS -Engage ortho for left knee septic arthritis and LLE abscesses    Microbiology:   Antibiotics: Vancomycin acyclovir and ceftriaxone 6/8-p  Nafcillin Cultures: Blood 6/8 2/2 MSSA 6/10 NGTD x2   SUBJECTIVE: Resting in bed intubated, more alert with eyes open Interval : Temp 101.3 overnight. Wbc 19.5k.   Review of Systems: Review of Systems  Unable to perform ROS: Intubated     Scheduled Meds:  aspirin  81 mg Per Tube Daily   atorvastatin  40 mg Per Tube Daily   chlorhexidine gluconate (MEDLINE KIT)  15 mL Mouth Rinse BID   Chlorhexidine Gluconate Cloth  6 each Topical Daily   cloZAPine  100 mg Per Tube Daily   enoxaparin (LOVENOX) injection  40 mg Subcutaneous Q24H   feeding supplement (PROSource TF)  45 mL Per Tube TID   fiber  1 packet Per Tube BID   fluPHENAZine  2.5 mg Per Tube QHS   insulin aspart  1-3 Units Subcutaneous Q4H   mouth rinse  15 mL Mouth Rinse 10 times per day   mupirocin ointment  1 application  Nasal BID   pantoprazole sodium  40 mg Per Tube Daily   vitamin B-12  1,000 mcg Per Tube Daily   Continuous Infusions:  feeding supplement (OSMOLITE 1.5 CAL) 55 mL/hr at 06/23/21 0800   nafcillin 12 g in sodium chloride 0.9 % 500 mL continuous infusion 20.8  mL/hr at 06/23/21 0800   PRN Meds:.acetaminophen, docusate, fentaNYL (SUBLIMAZE) injection, fentaNYL (SUBLIMAZE) injection, polyethylene glycol, white petrolatum No Known Allergies  OBJECTIVE: Vitals:   06/23/21 0439 06/23/21 0700 06/23/21 0744 06/23/21 0800  BP:  116/73  121/77  Pulse:  96 (!) 103 (!) 103  Resp:  _0 Temp:    99.7 F (37.6 C)  TempSrc:    Axillary  SpO2:  100% 100% 100%  Weight: 91.2 kg     Height:       Body mass index is 32.45 kg/m.  Physical Exam Constitutional:      Comments: Intubated  HENT:     Head:  Normocephalic and atraumatic.     Right Ear: Tympanic membrane normal.     Left Ear: Tympanic membrane normal.     Nose: Nose normal.     Mouth/Throat:     Mouth: Mucous membranes are moist.  Eyes:     Extraocular Movements: Extraocular movements intact.     Conjunctiva/sclera: Conjunctivae normal.     Pupils: Pupils are equal, round, and reactive to light.  Cardiovascular:     Rate and Rhythm: Normal rate and regular rhythm.     Heart sounds: No murmur heard.    No friction rub. No gallop.  Pulmonary:     Effort: Pulmonary effort is normal.     Breath sounds: Normal breath sounds.  Abdominal:     General: Abdomen is flat.     Palpations: Abdomen is soft.  Musculoskeletal:        General: Normal range of motion.  Skin:    General: Skin is warm and dry.  Neurological:     General: No focal deficit present.  Psychiatric:        Mood and Affect: Mood normal.       Lab Results Lab Results  Component Value Date   WBC 19.5 (H) 06/23/2021   HGB 9.8 (L) 06/23/2021   HCT 29.4 (L) 06/23/2021   MCV 79.5 (L) 06/23/2021   PLT 179 06/23/2021    Lab Results  Component Value Date   CREATININE 0.88 06/23/2021   BUN 21 (H) 06/23/2021   NA 143 06/23/2021   K 4.4 06/23/2021   CL 111 06/23/2021   CO2 23 06/23/2021    Lab Results  Component Value Date   ALT 63 (H) 06/20/2021   AST 85 (H) 06/20/2021   ALKPHOS 129 (H) 06/20/2021   BILITOT 1.6 (H) 06/20/2021        Laurice Record, [facility] 06/23/2021, 11:18 AM

## 2021-06-23 NOTE — Progress Notes (Signed)
[facility] Progress Note Patient Name: Debra Klein DOB: 1975/01/27 MRN: [id number]   Date of Service  06/23/2021  HPI/Events of Note  Loose stool , RN requests flexiseal  eICU Interventions  Order placed     Intervention Category Minor Interventions: Routine modifications to care plan (e.g. PRN medications for pain, fever)  Margaretmary Lombard 06/23/2021, 1:44 AM

## 2021-06-24 ENCOUNTER — Other Ambulatory Visit (HOSPITAL_COMMUNITY): Payer: Medicare Other

## 2021-06-24 ENCOUNTER — Inpatient Hospital Stay (HOSPITAL_COMMUNITY): Payer: Medicare Other | Admitting: Anesthesiology

## 2021-06-24 ENCOUNTER — Encounter (HOSPITAL_COMMUNITY)
Admission: EM | Disposition: A | Discharge status: Rehabilitation Facility | Payer: Self-pay | Source: Home / Self Care | Attending: Internal Medicine

## 2021-06-24 ENCOUNTER — Other Ambulatory Visit: Payer: Self-pay

## 2021-06-24 ENCOUNTER — Inpatient Hospital Stay (HOSPITAL_COMMUNITY): Payer: Medicare Other

## 2021-06-24 ENCOUNTER — Ambulatory Visit: Payer: Medicare Other | Admitting: Orthopaedic Surgery

## 2021-06-24 DIAGNOSIS — J9601 Acute respiratory failure with hypoxia: Secondary | ICD-10-CM | POA: Diagnosis not present

## 2021-06-24 DIAGNOSIS — R6520 Severe sepsis without septic shock: Secondary | ICD-10-CM | POA: Diagnosis not present

## 2021-06-24 DIAGNOSIS — Z8673 Personal history of transient ischemic attack (TIA), and cerebral infarction without residual deficits: Secondary | ICD-10-CM

## 2021-06-24 DIAGNOSIS — I340 Nonrheumatic mitral (valve) insufficiency: Secondary | ICD-10-CM

## 2021-06-24 DIAGNOSIS — A4101 Sepsis due to Methicillin susceptible Staphylococcus aureus: Secondary | ICD-10-CM | POA: Diagnosis not present

## 2021-06-24 DIAGNOSIS — M009 Pyogenic arthritis, unspecified: Secondary | ICD-10-CM | POA: Diagnosis not present

## 2021-06-24 DIAGNOSIS — N179 Acute kidney failure, unspecified: Secondary | ICD-10-CM | POA: Diagnosis not present

## 2021-06-24 DIAGNOSIS — G9341 Metabolic encephalopathy: Secondary | ICD-10-CM | POA: Diagnosis not present

## 2021-06-24 DIAGNOSIS — Z9911 Dependence on respirator [ventilator] status: Secondary | ICD-10-CM | POA: Diagnosis not present

## 2021-06-24 DIAGNOSIS — I10 Essential (primary) hypertension: Secondary | ICD-10-CM | POA: Diagnosis not present

## 2021-06-24 DIAGNOSIS — D649 Anemia, unspecified: Secondary | ICD-10-CM

## 2021-06-24 DIAGNOSIS — I339 Acute and subacute endocarditis, unspecified: Secondary | ICD-10-CM | POA: Diagnosis not present

## 2021-06-24 DIAGNOSIS — G9340 Encephalopathy, unspecified: Secondary | ICD-10-CM | POA: Diagnosis not present

## 2021-06-24 HISTORY — PX: KNEE ARTHROSCOPY: SHX127

## 2021-06-24 LAB — CBC WITH DIFFERENTIAL/PLATELET
Abs Immature Granulocytes: 1.2 10*3/uL — ABNORMAL HIGH (ref 0.00–0.07)
Basophils Absolute: 0 10*3/uL (ref 0.0–0.1)
Basophils Relative: 0 %
Blasts: 1 %
Eosinophils Absolute: 0 10*3/uL (ref 0.0–0.5)
Eosinophils Relative: 0 %
HCT: 24.7 % — ABNORMAL LOW (ref 36.0–46.0)
Hemoglobin: 8 g/dL — ABNORMAL LOW (ref 12.0–15.0)
Lymphocytes Relative: 8 %
Lymphs Abs: 1.9 10*3/uL (ref 0.7–4.0)
MCH: 26.2 pg (ref 26.0–34.0)
MCHC: 32.4 g/dL (ref 30.0–36.0)
MCV: 81 fL (ref 80.0–100.0)
Metamyelocytes Relative: 1 %
Monocytes Absolute: 1.2 10*3/uL — ABNORMAL HIGH (ref 0.1–1.0)
Monocytes Relative: 5 %
Myelocytes: 3 %
Neutro Abs: 18.8 10*3/uL — ABNORMAL HIGH (ref 1.7–7.7)
Neutrophils Relative %: 81 %
Platelets: 199 10*3/uL (ref 150–400)
Promyelocytes Relative: 1 %
RBC: 3.05 MIL/uL — ABNORMAL LOW (ref 3.87–5.11)
RDW: 18.6 % — ABNORMAL HIGH (ref 11.5–15.5)
WBC: 23.2 10*3/uL — ABNORMAL HIGH (ref 4.0–10.5)
nRBC: 0 % (ref 0.0–0.2)
nRBC: 0 /100 WBC

## 2021-06-24 LAB — ECHO TEE: Radius: 0.5 cm

## 2021-06-24 LAB — GLUCOSE, CAPILLARY
Glucose-Capillary: 105 mg/dL — ABNORMAL HIGH (ref 70–99)
Glucose-Capillary: 110 mg/dL — ABNORMAL HIGH (ref 70–99)
Glucose-Capillary: 127 mg/dL — ABNORMAL HIGH (ref 70–99)
Glucose-Capillary: 79 mg/dL (ref 70–99)
Glucose-Capillary: 94 mg/dL (ref 70–99)
Glucose-Capillary: 99 mg/dL (ref 70–99)

## 2021-06-24 LAB — METHYLMALONIC ACID, SERUM: Methylmalonic Acid, Quantitative: 149 nmol/L (ref 0–378)

## 2021-06-24 SURGERY — ARTHROSCOPY, KNEE
Anesthesia: General | Site: Knee | Laterality: Left

## 2021-06-24 MED ORDER — PROPOFOL 500 MG/50ML IV EMUL
INTRAVENOUS | Status: DC | PRN
  Administered 2021-06-24: 50 ug/kg/min via INTRAVENOUS

## 2021-06-24 MED ORDER — SODIUM CHLORIDE 0.9 % IV SOLN: INTRAVENOUS | Status: DC

## 2021-06-24 MED ORDER — ONDANSETRON HCL 4 MG/2ML IJ SOLN
INTRAMUSCULAR | Status: AC
  Filled 2021-06-24: qty 2

## 2021-06-24 MED ORDER — MIDAZOLAM HCL 2 MG/2ML IJ SOLN
INTRAMUSCULAR | Status: AC
  Filled 2021-06-24: qty 2

## 2021-06-24 MED ORDER — LACTATED RINGERS IV SOLN: INTRAVENOUS | Status: DC | PRN

## 2021-06-24 MED ORDER — ROCURONIUM BROMIDE 10 MG/ML (PF) SYRINGE
PREFILLED_SYRINGE | INTRAVENOUS | Status: AC
Start: 2021-06-24 — End: ?
  Filled 2021-06-24: qty 10

## 2021-06-24 MED ORDER — FENTANYL CITRATE (PF) 250 MCG/5ML IJ SOLN
INTRAMUSCULAR | Status: DC | PRN
Start: 2021-06-24 — End: 2021-06-24
  Administered 2021-06-24 (×3): 50 ug via INTRAVENOUS

## 2021-06-24 MED ORDER — DEXMEDETOMIDINE (PRECEDEX) IN NS 20 MCG/5ML (4 MCG/ML) IV SYRINGE
PREFILLED_SYRINGE | INTRAVENOUS | Status: DC | PRN
  Administered 2021-06-24: 8 ug via INTRAVENOUS

## 2021-06-24 MED ORDER — SODIUM CHLORIDE 0.9 % IR SOLN
Status: DC | PRN
  Administered 2021-06-24: 3000 mL

## 2021-06-24 MED ORDER — MIDAZOLAM HCL 2 MG/2ML IJ SOLN
INTRAMUSCULAR | Status: DC | PRN
  Administered 2021-06-24: 2 mg via INTRAVENOUS

## 2021-06-24 MED ORDER — BUPIVACAINE HCL (PF) 0.25 % IJ SOLN
INTRAMUSCULAR | Status: DC | PRN
  Administered 2021-06-24: 20 mL

## 2021-06-24 MED ORDER — ROCURONIUM BROMIDE 10 MG/ML (PF) SYRINGE
PREFILLED_SYRINGE | INTRAVENOUS | Status: DC | PRN
  Administered 2021-06-24: 50 mg via INTRAVENOUS

## 2021-06-24 MED ORDER — BUPIVACAINE HCL (PF) 0.25 % IJ SOLN
INTRAMUSCULAR | Status: AC
  Filled 2021-06-24: qty 30

## 2021-06-24 MED ORDER — PROPOFOL 10 MG/ML IV BOLUS
INTRAVENOUS | Status: AC
  Filled 2021-06-24: qty 20

## 2021-06-24 MED ORDER — ROCURONIUM BROMIDE 10 MG/ML (PF) SYRINGE
PREFILLED_SYRINGE | INTRAVENOUS | Status: AC
  Filled 2021-06-24: qty 10

## 2021-06-24 MED ORDER — PROPOFOL 10 MG/ML IV BOLUS
INTRAVENOUS | Status: DC | PRN
  Administered 2021-06-24: 50 mg via INTRAVENOUS

## 2021-06-24 MED ORDER — ROCURONIUM BROMIDE 50 MG/5ML IV SOLN
90.0000 mg | Freq: Once | INTRAVENOUS | Status: AC
  Administered 2021-06-24: 90 mg via INTRAVENOUS

## 2021-06-24 MED ORDER — MIDAZOLAM HCL 2 MG/2ML IJ SOLN
2.0000 mg | INTRAMUSCULAR | Status: DC | PRN
  Administered 2021-06-24: 2 mg via INTRAVENOUS

## 2021-06-24 MED ORDER — FENTANYL CITRATE (PF) 250 MCG/5ML IJ SOLN
INTRAMUSCULAR | Status: AC
  Filled 2021-06-24: qty 5

## 2021-06-24 MED ORDER — ONDANSETRON HCL 4 MG/2ML IJ SOLN
INTRAMUSCULAR | Status: DC | PRN
  Administered 2021-06-24: 4 mg via INTRAVENOUS

## 2021-06-24 SURGICAL SUPPLY — 34 items
BAG COUNTER SPONGE SURGICOUNT (BAG) ×1 IMPLANT
BAG SPNG CNTER NS LX DISP (BAG) ×1
BLADE EXCALIBUR 4.0X13 (MISCELLANEOUS) ×1 IMPLANT
BNDG CMPR MED 15X6 ELC VLCR LF (GAUZE/BANDAGES/DRESSINGS) ×1
BNDG ELASTIC 6X15 VLCR STRL LF (GAUZE/BANDAGES/DRESSINGS) ×1 IMPLANT
BNDG ELASTIC 6X5.8 VLCR STR LF (GAUZE/BANDAGES/DRESSINGS) ×2 IMPLANT
DRAPE ARTHROSCOPY W/POUCH 114 (DRAPES) ×2 IMPLANT
DRAPE U-SHAPE 47X51 STRL (DRAPES) ×2 IMPLANT
DRSG PAD ABDOMINAL 8X10 ST (GAUZE/BANDAGES/DRESSINGS) IMPLANT
DRSG XEROFORM 1X8 (GAUZE/BANDAGES/DRESSINGS) ×1 IMPLANT
DURAPREP 26ML APPLICATOR (WOUND CARE) ×2 IMPLANT
GAUZE SPONGE 4X4 12PLY STRL (GAUZE/BANDAGES/DRESSINGS) ×2 IMPLANT
GAUZE XEROFORM 1X8 LF (GAUZE/BANDAGES/DRESSINGS) ×1 IMPLANT
GLOVE BIOGEL PI IND STRL 8 (GLOVE) ×2 IMPLANT
GLOVE BIOGEL PI INDICATOR 8 (GLOVE) ×2
GLOVE ORTHO TXT STRL SZ7.5 (GLOVE) ×2 IMPLANT
GLOVE SURG ORTHO 8.0 STRL STRW (GLOVE) ×2 IMPLANT
GOWN STRL REUS W/ TWL LRG LVL3 (GOWN DISPOSABLE) ×1 IMPLANT
GOWN STRL REUS W/ TWL XL LVL3 (GOWN DISPOSABLE) ×2 IMPLANT
GOWN STRL REUS W/TWL LRG LVL3 (GOWN DISPOSABLE) ×2
GOWN STRL REUS W/TWL XL LVL3 (GOWN DISPOSABLE) ×4
KIT TURNOVER KIT B (KITS) ×2 IMPLANT
MANIFOLD NEPTUNE II (INSTRUMENTS) ×1 IMPLANT
NDL SPNL 18GX3.5 QUINCKE PK (NEEDLE) ×1 IMPLANT
NEEDLE SPNL 18GX3.5 QUINCKE PK (NEEDLE) ×2 IMPLANT
PACK ARTHROSCOPY DSU (CUSTOM PROCEDURE TRAY) ×2 IMPLANT
PAD ARMBOARD 7.5X6 YLW CONV (MISCELLANEOUS) ×3 IMPLANT
PADDING CAST COTTON 6X4 STRL (CAST SUPPLIES) ×2 IMPLANT
PORT APPOLLO RF 90DEGREE MULTI (SURGICAL WAND) IMPLANT
SPONGE T-LAP 4X18 ~~LOC~~+RFID (SPONGE) ×2 IMPLANT
SUT ETHILON 3 0 PS 1 (SUTURE) ×2 IMPLANT
TOWEL GREEN STERILE (TOWEL DISPOSABLE) ×2 IMPLANT
TOWEL GREEN STERILE FF (TOWEL DISPOSABLE) ×1 IMPLANT
TUBING ARTHROSCOPY IRRIG 16FT (MISCELLANEOUS) ×2 IMPLANT

## 2021-06-24 NOTE — Consult Note (Signed)
Reason for Consult:endocarditis Referring Physician: Dr. Clark  Debra Klein is an 46 y.o. female.  HPI: 46 yo woman with history of hypertension, hyperlipidemia, anemia and schizophrenia presented with altered mental status, general malaise, anorexia and multiple falls.  Intubated in ED.. Workup revealed Left MCA infarct and septic left knee joint.  Echo showed moderate to severe MR with small vegetation and moderate TR.  Blood cultures growing methacillin sensitive staph aureus.  oN Nafcillin IV.    Past Medical History:  Diagnosis Date   Hyperlipemia    Hypertension    IDA (iron deficiency anemia)    Schizophrenia (HCC)     Past Surgical History:  Procedure Laterality Date   No previous surgery      History reviewed. No pertinent family history.  Social History:  has no history on file for tobacco use, alcohol use, and drug use.  Allergies: No Known Allergies  Medications: Scheduled:  aspirin  81 mg Per Tube Daily   atorvastatin  40 mg Per Tube Daily   chlorhexidine gluconate (MEDLINE KIT)  15 mL Mouth Rinse BID   Chlorhexidine Gluconate Cloth  6 each Topical Daily   cloZAPine  100 mg Per Tube Daily   enoxaparin (LOVENOX) injection  40 mg Subcutaneous Q24H   feeding supplement (PROSource TF)  45 mL Per Tube TID   fiber  1 packet Per Tube BID   fluPHENAZine  2.5 mg Per Tube QHS   insulin aspart  1-3 Units Subcutaneous Q4H   mouth rinse  15 mL Mouth Rinse 10 times per day   mupirocin ointment  1 application  Nasal BID   pantoprazole sodium  40 mg Per Tube Daily   vitamin B-12  1,000 mcg Per Tube Daily    Results for orders placed or performed during the hospital encounter of 06/19/21 (from the past 48 hour(s))  Glucose, capillary     Status: None   Collection Time: 06/22/21 11:56 AM  Result Value Ref Range   Glucose-Capillary 94 70 - 99 mg/dL    Comment: Glucose reference range applies only to samples taken after fasting for at least 8 hours.  Glucose, capillary      Status: None   Collection Time: 06/22/21  3:11 PM  Result Value Ref Range   Glucose-Capillary 86 70 - 99 mg/dL    Comment: Glucose reference range applies only to samples taken after fasting for at least 8 hours.  Glucose, capillary     Status: Abnormal   Collection Time: 06/22/21  8:14 PM  Result Value Ref Range   Glucose-Capillary 126 (H) 70 - 99 mg/dL    Comment: Glucose reference range applies only to samples taken after fasting for at least 8 hours.  Glucose, capillary     Status: Abnormal   Collection Time: 06/23/21 12:01 AM  Result Value Ref Range   Glucose-Capillary 141 (H) 70 - 99 mg/dL    Comment: Glucose reference range applies only to samples taken after fasting for at least 8 hours.  Glucose, capillary     Status: None   Collection Time: 06/23/21  4:06 AM  Result Value Ref Range   Glucose-Capillary 97 70 - 99 mg/dL    Comment: Glucose reference range applies only to samples taken after fasting for at least 8 hours.  Triglycerides     Status: Abnormal   Collection Time: 06/23/21  6:20 AM  Result Value Ref Range   Triglycerides 157 (H) <150 mg/dL    Comment: Performed at [facility], [place] [address], [place], [place] [address]  Magnesium     Status: None   Collection Time: 06/23/21  6:20 AM  Result Value Ref Range   Magnesium 1.8 1.7 - 2.4 mg/dL    Comment: Performed at [facility] [address], [place], [place] [address]  Phosphorus     Status: None   Collection Time: 06/23/21  6:20 AM  Result Value Ref Range   Phosphorus 3.5 2.5 - 4.6 mg/dL    Comment: Performed at [facility] [address] [place], [place] [address]  CBC with Differential/Platelet     Status: Abnormal   Collection Time: 06/23/21  6:20 AM  Result Value Ref Range   WBC 19.5 (H) 4.0 - 10.5 K/uL   RBC 3.70 (L) 3.87 - 5.11 MIL/uL   Hemoglobin 9.8 (L) 12.0 - 15.0 g/dL   HCT 29.4 (L) 36.0 - 46.0 %   MCV 79.5 (L) 80.0 - 100.0 fL   MCH 26.5 26.0 - 34.0 pg   MCHC 33.3 30.0 -  36.0 g/dL   RDW 18.1 (H) 11.5 - 15.5 %   Platelets 179 150 - 400 K/uL   nRBC 0.0 0.0 - 0.2 %   Neutrophils Relative % 70 %   Neutro Abs 13.6 (H) 1.7 - 7.7 K/uL   Lymphocytes Relative 11 %   Lymphs Abs 2.2 0.7 - 4.0 K/uL   Monocytes Relative 8 %   Monocytes Absolute 1.5 (H) 0.1 - 1.0 K/uL   Eosinophils Relative 0 %   Eosinophils Absolute 0.0 0.0 - 0.5 K/uL   Basophils Relative 0 %   Basophils Absolute 0.1 0.0 - 0.1 K/uL   WBC Morphology      MODERATE LEFT SHIFT (>5% METAS AND MYELOS,OCC PRO NOTED)   RBC Morphology See Note    Smear Review MORPHOLOGY UNREMARKABLE    Immature Granulocytes 11 %   Abs Immature Granulocytes 2.15 (H) 0.00 - 0.07 K/uL   Target Cells PRESENT     Comment: Performed at [facility], [place] [address], [place], [place] [address]  Basic metabolic panel     Status: Abnormal   Collection Time: 06/23/21  6:20 AM  Result Value Ref Range   Sodium 143 135 - 145 mmol/L   Potassium 4.4 3.5 - 5.1 mmol/L    Comment: DELTA CHECK NOTED   Chloride 111 98 - 111 mmol/L   CO2 23 22 - 32 mmol/L   Glucose, Bld 96 70 - 99 mg/dL    Comment: Glucose reference range applies only to samples taken after fasting for at least 8 hours.   BUN 21 (H) 6 - 20 mg/dL   Creatinine, Ser 0.88 0.44 - 1.00 mg/dL   Calcium 8.3 (L) 8.9 - 10.3 mg/dL   GFR, Estimated >60 >60 mL/min    Comment: (NOTE) Calculated using the CKD-EPI Creatinine Equation (2021)    Anion gap 9 5 - 15    Comment: Performed at [facility] [address] [place], [place] [address]  Glucose, capillary     Status: None   Collection Time: 06/23/21  7:54 AM  Result Value Ref Range   Glucose-Capillary 98 70 - 99 mg/dL    Comment: Glucose reference range applies only to samples taken after fasting for at least 8 hours.  Body fluid culture w Gram Stain     Status: None (Preliminary result)   Collection Time: 06/23/21 11:34 AM   Specimen: Body Fluid  Result Value Ref Range   Specimen Description FLUID     Special Requests SYNOVIAL LEFT KNEE    Gram Stain      RARE WBC PRESENT,BOTH PMN AND MONONUCLEAR NO ORGANISMS SEEN Performed at [facility], [address] [place], [place] [address]    Culture PENDING    Report Status PENDING   Synovial cell count + diff, w/ crystals     Status: Abnormal   Collection Time: 06/23/21 11:34 AM  Result Value Ref Range   Color, Synovial ORANGE (A) YELLOW   Appearance-Synovial TURBID (A) CLEAR   Crystals, Fluid NO CRYSTALS SEEN    WBC, Synovial 225,000 (H) 0 - 200 /cu mm   Neutrophil, Synovial 88 (H) 0 - 25 %   Lymphocytes-Synovial Fld 6 0 - 20 %   Monocyte-Macrophage-Synovial Fluid 6 (L) 50 - 90 %   Eosinophils-Synovial 0 0 - 1 %    Comment: Performed at [facility], [address] , [place] [address]  Glucose, capillary     Status: None   Collection Time: 06/23/21 11:42 AM  Result Value Ref Range   Glucose-Capillary 96 70 - 99 mg/dL    Comment: Glucose reference range applies only to samples taken after fasting for at least 8 hours.  Glucose, capillary     Status: None   Collection Time: 06/23/21  3:51 PM  Result Value Ref Range   Glucose-Capillary 89 70 - 99 mg/dL    Comment: Glucose reference range applies only to samples taken after fasting for at least 8 hours.  Glucose, capillary     Status: None   Collection Time: 06/23/21  7:55 PM  Result Value Ref Range   Glucose-Capillary 98 70 - 99 mg/dL    Comment: Glucose reference range applies only to samples taken after fasting for at least 8 hours.  Glucose, capillary     Status: Abnormal   Collection Time: 06/23/21 11:52 PM  Result Value Ref Range   Glucose-Capillary 110 (H) 70 - 99 mg/dL    Comment: Glucose reference range applies only to samples taken after fasting for at least 8 hours.  CBC with Differential/Platelet     Status: Abnormal   Collection Time: 06/24/21  2:44 AM  Result Value Ref Range   WBC 23.2 (H) 4.0 - 10.5 K/uL   RBC 3.05 (L) 3.87 - 5.11 MIL/uL    Hemoglobin 8.0 (L) 12.0 - 15.0 g/dL   HCT 24.7 (L) 36.0 - 46.0 %   MCV 81.0 80.0 - 100.0 fL   MCH 26.2 26.0 - 34.0 pg   MCHC 32.4 30.0 - 36.0 g/dL   RDW 18.6 (H) 11.5 - 15.5 %   Platelets 199 150 - 400 K/uL   nRBC 0.0 0.0 - 0.2 %   Neutrophils Relative % 81 %   Neutro Abs 18.8 (H) 1.7 - 7.7 K/uL   Lymphocytes Relative 8 %   Lymphs Abs 1.9 0.7 - 4.0 K/uL   Monocytes Relative 5 %   Monocytes Absolute 1.2 (H) 0.1 - 1.0 K/uL   Eosinophils Relative 0 %   Eosinophils Absolute 0.0 0.0 - 0.5 K/uL   Basophils Relative 0 %   Basophils Absolute 0.0 0.0 - 0.1 K/uL   WBC Morphology MILD LEFT SHIFT (1-5% METAS, OCC MYELO, OCC BANDS)    nRBC 0 0 /100 WBC   Metamyelocytes Relative 1 %   Myelocytes 3 %   Promyelocytes Relative 1 %   Blasts 1 %     Abs Immature Granulocytes 1.20 (H) 0.00 - 0.07 K/uL    Comment: Performed at [facility], [address] [place], [place] [address]  Glucose, capillary     Status: Abnormal   Collection Time: 06/24/21  4:08 AM  Result Value Ref Range   Glucose-Capillary 110 (H) 70 - 99 mg/dL    Comment: Glucose reference range applies only to samples taken after fasting for at least 8 hours.  Glucose, capillary     Status: Abnormal   Collection Time: 06/24/21  8:13 AM  Result Value Ref Range   Glucose-Capillary 105 (H) 70 - 99 mg/dL    Comment: Glucose reference range applies only to samples taken after fasting for at least 8 hours.    DG CHEST PORT 1 VIEW  Result Date: 06/24/2021 CLINICAL DATA:  Sepsis. Acute respiratory failure. Endotracheally intubated. EXAM: PORTABLE CHEST 1 VIEW COMPARISON:  06/19/2021 FINDINGS: Endotracheal tube and nasogastric tube remain in appropriate position. Increased subsegmental atelectasis is seen in both mid lung zones. No evidence of pulmonary consolidation or pleural effusion. IMPRESSION: Increased bilateral subsegmental atelectasis. Electronically Signed   By: John A Stahl M.D.   On: 06/24/2021 08:17    ECHOCARDIOGRAM  06/20/2021 IMPRESSIONS     1. Left ventricular ejection fraction, by estimation, is 50%. The left  ventricle has mildly decreased function. The left ventricle demonstrates  global hypokinesis. Left ventricular diastolic parameters are consistent  with Grade I diastolic dysfunction  (impaired relaxation).   2. Right ventricular systolic function is normal. The right ventricular  size is normal. There is mildly elevated pulmonary artery systolic  pressure. The estimated right ventricular systolic pressure is 37.8 mmHg.   3. Small 10 x 6 mm vegetation noted on the mitral valve. The mitral valve  is abnormal. Moderate to severe mitral valve regurgitation not fully  visualized due to eccentricity, splay artifact present suggests  significant MR. No evidence of mitral  stenosis.   4. Tricuspid valve regurgitation is moderate.   5. The aortic valve is tricuspid. Aortic valve regurgitation is not  visualized. No aortic stenosis is present.   6. The inferior vena cava is dilated in size with <50% respiratory  variability, suggesting right atrial pressure of 15 mmHg.   7. Suggest TEE to assess vegetation and mitral regurgitation.   I personally reviewed the echo images. + MR, small vegetation, moderate TR, EF 50% Review of Systems  Unable to perform ROS: Intubated   Blood pressure 114/70, pulse 85, temperature 100.3 F (37.9 C), temperature source Axillary, resp. rate 15, height 5' 6" (1.676 m), weight 87.8 kg, SpO2 100 %. Physical Exam Constitutional:      Appearance: She is ill-appearing.  HENT:     Head: Normocephalic and atraumatic.  Cardiovascular:     Rate and Rhythm: Normal rate.     Heart sounds: Murmur (3/6 holosystolic) heard.  Pulmonary:     Breath sounds: No wheezing or rales.     Comments: Clear anteriorly Abdominal:     Palpations: Abdomen is soft.     Tenderness: There is no abdominal tenderness.  Skin:    General: Skin is warm and dry.  Neurological:     Mental  Status: She is alert.     Motor: Weakness (right hemiparesis) present.     Comments: Inconsistent following commands.    Assessment/Plan: 46 yo woman with hypertension, hyperlipidemia, Schizophrenia presented with sepsis and CVA. Workup has shown MSSA bacteremia, moderate to severe MR with small vegetation, and infected left   knee joint.  She is currently hemodynamically stable.. Still having fevers and WBC trending upward.  Vent only on 30% FiO2 and lungs clear on exam.  To have TEE today.  Also scheduled for washout of left knee in OR later today.  Will follow  Melrose Nakayama 06/24/2021, 8:31 AM

## 2021-06-24 NOTE — Progress Notes (Addendum)
STROKE TEAM PROGRESS NOTE   INTERVAL HISTORY Patient laying in bed awake, tracks examiner, Inconsistent with following commands. Appears to not be as interactive as yesterday, more lethargic. TEE scheduled for today @ 3pm. Will be having irrigation of her left knee tonight after TEE. RN at bedside, no new neuro events overnight.   Vitals:   06/24/21 0600 06/24/21 0700 06/24/21 0742 06/24/21 0800  BP: 110/68 108/66 108/66 114/70  Pulse: 85 79 87 85  Resp: 15 14 17 15   Temp:      TempSrc:      SpO2: 100% 100% 100% 100%  Weight:      Height:       CBC:  Recent Labs  Lab 06/23/21 0620 06/24/21 0244  WBC 19.5* 23.2*  NEUTROABS 13.6* 18.8*  HGB 9.8* 8.0*  HCT 29.4* 24.7*  MCV 79.5* 81.0  PLT 179 161    Basic Metabolic Panel:  Recent Labs  Lab 06/22/21 0335 06/23/21 0620  NA 145 143  K 3.5 4.4  CL 110 111  CO2 25 23  GLUCOSE 145* 96  BUN 28* 21*  CREATININE 0.88 0.88  CALCIUM 8.0* 8.3*  MG 1.8 1.8  PHOS 2.9 3.5    Lipid Panel:  Recent Labs  Lab 06/20/21 0200 06/20/21 0301 06/23/21 0620  CHOL 161  --   --   TRIG 522*   < > 157*  HDL <10*  --   --   CHOLHDL  UNABLE TO CALCULATE  --   --   VLDL UNABLE TO CALCULATE  --   --   LDLCALC UNABLE TO CALCULATE IF TRIGLYCERIDE OVER 400 mg/dL  --   --    < > = values in this interval not displayed.    HgbA1c:  Recent Labs  Lab 06/20/21 0015  HGBA1C 6.5*    Urine Drug Screen:  Recent Labs  Lab 06/19/21 1330  LABOPIA NONE DETECTED  COCAINSCRNUR NONE DETECTED  LABBENZ NONE DETECTED  AMPHETMU NONE DETECTED  THCU NONE DETECTED  LABBARB NONE DETECTED     Alcohol Level  Recent Labs  Lab 06/19/21 1341  ETH <10     IMAGING past 24 hours DG CHEST PORT 1 VIEW  Result Date: 06/24/2021 CLINICAL DATA:  Sepsis. Acute respiratory failure. Endotracheally intubated. EXAM: PORTABLE CHEST 1 VIEW COMPARISON:  06/19/2021 FINDINGS: Endotracheal tube and nasogastric tube remain in appropriate position. Increased  subsegmental atelectasis is seen in both mid lung zones. No evidence of pulmonary consolidation or pleural effusion. IMPRESSION: Increased bilateral subsegmental atelectasis. Electronically Signed   By: Marlaine Hind M.D.   On: 06/24/2021 08:17    PHYSICAL EXAM General:  Intubated, well-nourished, well-developed patient in no acute distress.  Patient is intubated she has a left knee brace. Respiratory:  breathing over the vent. Neurological : She is intubated.  Off sedation.  Opens eyes, will track examiner.  Grimaces to pain. Left gaze deviation.  Globally aphasic.  Inconsistently follows commands.  Does not blink to threat on either side.:  Pupils equal and reactive, positive corneal, cough and gag reflexes, moves right upper extremity and lower extremity  to noxious stimuli.  She is able to left left arm antigravity. Eli Phillips of both mute  ASSESSMENT/PLAN [name] is a 46 y.o. female with history of schizophrenia, HTN and HLD presenting with AMS, fever and acute onset left gaze deviation.  She may have had seizure activity at this time, cEEG ongoing.  She was intubated for airway protection.  CT  head was concerning for left sided stroke, and CTA revealed left MCA M2 branch occlusion.  MRI brain reveals large left MCA territory infarct and scattered acute infarcts in right hemisphere and left occipital lobe.  Stroke: Left MCA infarct and small scattered acute infarcts likely secondary due to embolism from  bacterial endocarditis CT head Loss of gray-white matter differentiation along left insula CTA head & neck occlusion of left MCA M2 branch in insula.  No other intracranial arterial occlusion or high grade stenosis MRI  large left MCA territory cortical infarct, scattered punctate foci of acute ischemia in right hemisphere and left occipital lobe 2D Echo Ef 50%. Mitral Valve: Small 10 x 6 mm vegetation noted on the mitral valve. The mitral valve is abnormal. Moderate to severe mitral  valve regurgitation.  No evidence of mitral valve stenosis. Will need TEE LDL direct 27.7 HgbA1c 6.5 VTE prophylaxis - lovenox     Diet   Diet NPO time specified   No antithrombotic prior to admission, now on aspirin 81 mg daily.  Therapy recommendations: CLR  disposition:  pending  Possible seizures Patient had possible seizure on 6/8 with new onset left gaze deviation and altered mental status EEG d/c'd. No seizures identified  Hypertension Home meds:  none Stable Permissive hypertension (OK if < 220/120) but gradually normalize in 5-7 days Long-term BP goal normotensive  Hyperlipidemia Home meds:  atorvastatin 20 mg daily, increased to 40 mg daily LDL 27.7, goal < 70 Continue statin at discharge  Diabetes type II Controlled Home meds:  none HgbA1c 6.5, goal < 7.0 CBGs Recent Labs    06/23/21 2352 06/24/21 0408 06/24/21 0813  GLUCAP 110* 110* 105*     SSI  Schizophrenia Patient has history of schizophrenia Had stopped taking medications about a week prior to admission and had become nonverbal Consult psychiatry when patient extubated  Acute Hypoxic Respiratory Failure Patient was intubated for airway protection Ventilator management per CCM  Other Stroke Risk Factors none  Other Active Problems Sepsis bacteremia on nafcillin  MV endocarditis  Hospital day # [place] DNP, [id number]    ATTENDING ATTESTATION:   46 year old female with M2 MCA stroke from cardio embolus with vegetation on heart valve.  Infectious disease following they recommend continuing nafcillin.  Trial of extubation tomorrow due to TEE/Knee watshout  if she does not pass then will need a trach.   TEE and left knee washout by ortho today.  Cardiology has evaluated patient and sees no need for surgical intervention for vegetation given overall poor surgical candidate.  Worsening leukocytosis CCM and ID following.    Dr. Reeves Forth evaluated pt independently, reviewed imaging,  chart, labs. Discussed and formulated plan with the APP. Please see APP note above for details.     This patient is critically ill due to respiratory distress, endocarditis, stroke  and at significant risk of neurological worsening, death form heart failure, respiratory failure, recurrent stroke, bleeding from [facility], seizure, sepsis. This patient's care requires constant monitoring of vital signs, hemodynamics, respiratory and cardiac monitoring, review of multiple databases, neurological assessment, discussion with family, other specialists and medical decision making of high complexity. I spent 35 minutes of neurocritical care time in the care of this patient.    Sheddrick Lattanzio,MD    To contact Stroke Continuity provider, please refer to [email or web address] After hours, contact General Neurology

## 2021-06-24 NOTE — Progress Notes (Addendum)
NAME:  Debra Klein, MRN:  [id number], DOB:  1975/03/19, LOS: 5 ADMISSION DATE:  06/19/2021, CONSULTATION DATE:  06/19/21 REFERRING MD:  Laurena Spies, CHIEF COMPLAINT:  respiratory failure   History of Present Illness:  [name] is a 46 year old woman with a history of schizophrenia who presented to the hospital today with acute encephalopathy. History obtained through chart review:   Family reports that about 3 weeks ago the patient started to be less verbal, but was still managing her medications. They were noticing for the last few days they would find a pill or 2 dropped on the floor although she was still overall taking her medications.  Her verbal output was actually improving in the last week or so, but then acutely today she was completely nonverbal and so they came to the ED for further evaluation.  They report she did not have any other complaints and did not notice any fevers or any physical issues other than left leg swelling and pain   Per chart review, she stopped taking her medications approximately 1 week ago, had a fall on 06/15/2021 with left ankle swelling, became nonverbal on 6/6, and subsequently had another fall on 6/7 found to have a left lower leg fracture and discharged with a cam walker boot after radiographs of her left ankle and tibia/fibula revealed previous intramedullary rodding and rod removal of the left tibia without clear evidence of acute fracture.  Subsequently she fell again on 6/8 and was found to be febrile (101.2), tachycardic to 110s, noted to be nonverbal, not following commands, frequently dozing off but withdrawing in all 4 extremities without a facial droop.  She was started on vancomycin, ceftriaxone, acyclovir for meningitis coverage.  Additionally, per hospitalist admission note LP was attempted but unsuccessful at bedside.  Subsequently at approximately 9 PM the patient had a change in mental status (drooling) with gaze deviation to the left and was  intubated. PCCM consulted. Head CT demonstrating an insular hypodensity concerning for MCA infarct.   Pertinent  Medical History  Schizophrenia HTN anemia  Significant Hospital Events: Including procedures, antibiotic start and stop dates in addition to other pertinent events   6/7: Fall --> fibula fracture, Minimally verbal at an outpatient visit with [facility]  6/8: Presented to the ED nonverbal with altered mentation. Progressively worsening mentation with requirement of intubation for airway protection.  Empirically treated for sepsis, presumed meningitis. MRI with large MCA infarct and punctate right hemisphere infarcts.  6/9: Blood cultures obtained on arrival positive for MSSA  6/10 MRI knee demonstrates septic arthritis.   Interim History / Subjective:  Planning for TEE and L knee arthroscopic washout with Ortho today. Tmax 101.3.  Objective   Blood pressure 110/68, pulse 85, temperature 100.3 F (37.9 C), temperature source Axillary, resp. rate 15, height 5\' 6"  (1.676 m), weight 87.8 kg, SpO2 100 %.    Vent Mode: PRVC FiO2 (%):  [30 %] 30 % Set Rate:  [14 bmp] 14 bmp Vt Set:  [470 mL] 470 mL PEEP:  [5 cmH20] 5 cmH20 Pressure Support:  [5 cmH20] 5 cmH20 Plateau Pressure:  [14 cmH20-16 cmH20] 16 cmH20   Intake/Output Summary (Last 24 hours) at 06/24/2021 0707 Last data filed at 06/23/2021 1800 Gross per 24 hour  Intake 723.19 ml  Output 650 ml  Net 73.19 ml    Filed Weights   06/22/21 0500 06/23/21 0439 06/24/21 0500  Weight: 89 kg 91.2 kg 87.8 kg   Examination: General: critically ill appearing woman lying in bed  intubated, not sedated HENT: [place]/AT, eyes anicteric, ETT in place. Oral mucosa moist. Lungs: minimal bilateral rhonchi, no significant secretions from MV. Synchronous with MV Cardiovascular: B1D1, apical sysolic murmur, RRR Abdomen: soft, NT Extremities: L>RLE edema, no cyanosis Neuro:  Awake but not following commands, tracks with eyes. No significant  gag, strong cough reflex and moved LUE with ETT suctioning. Derm: no diffuse rashes, warm, dry  WBC 23.2 H/H 8.0/24.7 BG 90-110s Blood cultures> remain NGTD Synovial fluid> staph aureus  CXR personally reviewed> no new opacities, ETT remains in appropriate position  Assessment & Plan:  Acute respiratory failure with hypoxia requiring mechanical ventilation - LTVV -VAP prevention protocol -PAD protocol for sedation -daily SAT & SBT as appropriate; holding today due to need for procedures -worry she will need trach for long-term airway protection and help with suctioning> family indicated they would want this option to facilitate recovery if needed  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 -con't nafcillin --TEE today> confirmed MV vegetations --PT, OT, SLP; anticipate a prolonged recovery - no longer requires statin or aspirin for secondary prevention  Sepsis due to MSSA bacteremia, L knee septic arthritis, native MV endocarditis Presume persistent fevers are attributable to uncontrolled sources -appreciate Ortho's management- planning for washout in OR today -TEE today -awaiting TCTS recommendations for left-sided endocarditis -appreciate ID's recommendations  AKI due to sepsis from Staph aureus bacteremia, resolved -con't to monitor renal function -strict I/Os  Schizophrenia, prominent negative symptoms versus stroke symptoms -Continue PTA clozapine & fluphenazine  Acute metabolic encephalopathy due to sepsis and stroke, may also have prominent negative symptoms from schizophrenia -Goal RASS 0; sedation only as required to tolerate MV. - Con't clozapine and fluphenazine for schizophrenia --neuroprotective measures  Hypergylcemia -SSI PRN -goal BG 140-180  Best Practice (right click and "Reselect all SmartList Selections" daily)   Diet/type: tubefeeds- on hold  for OR DVT prophylaxis: LMWH GI prophylaxis: PPI Lines: N/A Foley:  N/A Code Status:  full code Last date of multidisciplinary goals of care discussion [Mother and sister updated at bedside on 6/9]  This patient is critically ill with multiple organ system failure which requires frequent high complexity decision making, assessment, support, evaluation, and titration of therapies. This was completed through the application of advanced monitoring technologies and extensive interpretation of multiple databases. During this encounter critical care time was devoted to patient care services described in this note for 46 minutes.  Debra Hy, DO 06/24/21 4:11 PM [facility]

## 2021-06-24 NOTE — CV Procedure (Signed)
     Transesophageal Echocardiogram Note  Rikayla Demmon [id number] 1975-11-19  Procedure: Transesophageal Echocardiogram Indications: Mitral valve vegetation  Procedure Details Consent: Obtained Time Out: Verified patient identification, verified procedure, site/side was marked, verified correct patient position, special equipment/implants available, Radiology Safety Procedures followed,  medications/allergies/relevent history reviewed, required imaging and test results available.  Performed  Medications: Fentanyl: 81mcg Versed: 2mg  Rocuronium 90mg   Left Ventrical:  LVEF 60-65%  Mitral Valve: There is a large, highly mobile vegetation on the posterior mitral valve leaflet. There is also an anechoic region surrounding the vegetation with color flow visualized into the area consistent with abscess There is moderate, highly eccentric mitral regurgitation without flow reversal noted in the pulmonary veins  Aortic Valve: Tricuspid. No AI.  Tricuspid Valve: Normal structure. Trivial TR  Pulmonic Valve: Normal structure. No PI.  Left Atrium/ Left atrial appendage: No LAA thrombus  Atrial septum: Grossly normal  Aorta: No significant plaque   Complications: No apparent complications Patient did tolerate procedure well.  Freada Bergeron, MD 06/24/2021, 2:41 PM

## 2021-06-24 NOTE — Progress Notes (Signed)
For TEE today; no new recommendations [name]

## 2021-06-24 NOTE — Progress Notes (Signed)
[facility] for Infectious Disease  Date of Admission:  06/19/2021   Total days of inpatient antibiotics 4  Principal Problem:   Sepsis ([place]) Active Problems:   Schizophrenia ([place])   Transaminitis   AKI (acute kidney injury) ([place])   Bacteremia due to methicillin susceptible Staphylococcus aureus (MSSA)   Toxic encephalopathy   Cerebrovascular accident (CVA) due to embolic occlusion of left middle cerebral artery ([place])   Acute respiratory failure with hypoxia (HCC)   Septic arthritis of knee, left [facility])          Assessment: 46 year old female with schizophrenia admitted with altered mental status.  Intubated in the ED, found to have CVA with MSSA bacteremia.  On arrival, she had temp of 101.2, WBC 15 K.  #MSSA bacteremia with native mitral valve endocarditis with mets to brain and left knee septic arthritis and LLE abscesses #Hx cortisone shot in left knee 2 weeks prior to admission(likely what seeded the bacteremia) #Left Patellar tendon repair-infected #AMS #CVA #Fever #Leukocytosis #Hx of Falls -CTH on 6/8 showed loss of gray white matter alone with insula concerning for infarction -CT on head neck 6/9 showed left MCA branch in the insula. No embolic source in the eck identified -Blood Cx from admission returned + for MSSA. ID engaged -On 6/9: I spoke with mother(whom pt lives with) and sister. PT had not been feeling well(eating less, and not responding to questions at times) for about a week. She had a cortisone shot in left knee about 2 weeks ago. Had dental procedure(filling) one weeks ago. Also noted to be having falls over the last week.  -MRI of the left knee showed periarticular OM most severely affecting th tibia, mild joint effusion,  several abscesses of LLE in popliteus muscle, anterior musculature, anterolateral knee, infected patellar tendon repair. CT face sinus did not show signs of abscess -TTE showed 10 x 69mm vegetation on mitral valve, moderate to  sever mitral valve regurgitation.  Recommendations:  -Continue nafcillin -Follow repeat blood Cx form 6/10 to ensure clearance -Follow-up TEE(planned for today) -Ortho engaged and plan for knee washout today    Microbiology:   Antibiotics: Vancomycin acyclovir and ceftriaxone 6/8-p  Nafcillin Cultures: Blood 6/8 2/2 MSSA 6/10 NGTD x2   SUBJECTIVE: Resting in bed intubated, able to open eyes.  Interval : Temp 100.7 overnight with wbc 23k  Review of Systems: Review of Systems  Unable to perform ROS: Intubated     Scheduled Meds:  aspirin  81 mg Per Tube Daily   atorvastatin  40 mg Per Tube Daily   chlorhexidine gluconate (MEDLINE KIT)  15 mL Mouth Rinse BID   Chlorhexidine Gluconate Cloth  6 each Topical Daily   cloZAPine  100 mg Per Tube Daily   enoxaparin (LOVENOX) injection  40 mg Subcutaneous Q24H   feeding supplement (PROSource TF)  45 mL Per Tube TID   fiber  1 packet Per Tube BID   fluPHENAZine  2.5 mg Per Tube QHS   insulin aspart  1-3 Units Subcutaneous Q4H   mouth rinse  15 mL Mouth Rinse 10 times per day   mupirocin ointment  1 application  Nasal BID   pantoprazole sodium  40 mg Per Tube Daily   vitamin B-12  1,000 mcg Per Tube Daily   Continuous Infusions:  feeding supplement (OSMOLITE 1.5 CAL) Stopped (06/24/21 0000)   nafcillin 12 g in sodium chloride 0.9 % 500 mL continuous infusion 20.8 mL/hr at 06/23/21 1700  PRN Meds:.acetaminophen, docusate, fentaNYL (SUBLIMAZE) injection, fentaNYL (SUBLIMAZE) injection, polyethylene glycol, white petrolatum No Known Allergies  OBJECTIVE: Vitals:   06/24/21 0700 06/24/21 0742 06/24/21 0800 06/24/21 0900  BP: 108/66 108/66 114/70 114/70  Pulse: 79 87 85 83  Resp: $Remo'14 17 15 16  'cpFhD$ Temp:   (!) 100.7 F (38.2 C)   TempSrc:   Axillary   SpO2: 100% 100% 100% 99%  Weight:      Height:       Body mass index is 31.24 kg/m.  Physical Exam Constitutional:      Comments: Intubated  HENT:     Head: Normocephalic  and atraumatic.     Right Ear: Tympanic membrane normal.     Left Ear: Tympanic membrane normal.     Nose: Nose normal.     Mouth/Throat:     Mouth: Mucous membranes are moist.  Eyes:     Extraocular Movements: Extraocular movements intact.     Conjunctiva/sclera: Conjunctivae normal.     Pupils: Pupils are equal, round, and reactive to light.  Cardiovascular:     Rate and Rhythm: Normal rate and regular rhythm.     Heart sounds: No murmur heard.    No friction rub. No gallop.  Pulmonary:     Effort: Pulmonary effort is normal.     Breath sounds: Normal breath sounds.  Abdominal:     General: Abdomen is flat.     Palpations: Abdomen is soft.  Musculoskeletal:        General: Normal range of motion.  Skin:    General: Skin is warm and dry.  Neurological:     General: No focal deficit present.  Psychiatric:        Mood and Affect: Mood normal.       Lab Results Lab Results  Component Value Date   WBC 23.2 (H) 06/24/2021   HGB 8.0 (L) 06/24/2021   HCT 24.7 (L) 06/24/2021   MCV 81.0 06/24/2021   PLT 199 06/24/2021    Lab Results  Component Value Date   CREATININE 0.88 06/23/2021   BUN 21 (H) 06/23/2021   NA 143 06/23/2021   K 4.4 06/23/2021   CL 111 06/23/2021   CO2 23 06/23/2021    Lab Results  Component Value Date   ALT 63 (H) 06/20/2021   AST 85 (H) 06/20/2021   ALKPHOS 129 (H) 06/20/2021   BILITOT 1.6 (H) 06/20/2021        Laurice Record, [place] 06/24/2021, 11:12 AM

## 2021-06-24 NOTE — Brief Op Note (Signed)
06/24/2021  5:57 PM  PATIENT:  Debra Klein  46 y.o. female  PRE-OPERATIVE DIAGNOSIS:  Left Knee Infection  POST-OPERATIVE DIAGNOSIS:  Left Knee Infection  PROCEDURE:  Procedure(s): ARTHROSCOPY LEFT KNEE IRRIGRATION AND DEBRIEDMENT AND SYNOVECTOMY (Left)  SURGEON:  Surgeon(s) and Role:    Mcarthur Rossetti, MD - Primary  PHYSICIAN ASSISTANT: Benita Stabile, PA-C  ANESTHESIA:   local and general  DICTATION: .Other Dictation: Dictation Number [id number]  PLAN OF CARE: Admit to inpatient   PATIENT DISPOSITION:  ICU - intubated and hemodynamically stable.   Delay start of Pharmacological VTE agent (>24hrs) due to surgical blood loss or risk of bleeding: not applicable

## 2021-06-24 NOTE — Op Note (Unsigned)
NAMEKAZI, MONTORO MEDICAL RECORD NO: [id number] ACCOUNT NO: [id number] DATE OF BIRTH: 30-May-1975 FACILITY: [facility] LOCATION: [id number] PHYSICIAN: Lind Guest. Ninfa Linden, MD  Operative Report   DATE OF PROCEDURE: 06/19/2021  PREOPERATIVE DIAGNOSIS:  Left knee septic joint.  POSTOPERATIVE DIAGNOSIS:  Left knee septic joint.  PROCEDURE:  Left knee arthroscopy with debridement and synovectomy of all three compartments.  FINDINGS:  Minimal synovitis, left knee and no gross purulence.  SURGEON:  Lind Guest. Ninfa Linden, MD.  ASSISTANT:  Benita Stabile, PA-C.  ANESTHESIA:   1.  General. 2.  Local with 0.25% plain Marcaine.  ESTIMATED BLOOD LOSS:  Minimal.  COMPLICATIONS:  None.  INDICATIONS:  The patient is a 46 year old female with presumed septic arthritis of her left knee.  She is in the ICU with other comorbidities and medical issues.  She had a steroid injection in her left knee due to severe arthritis several weeks ago.   An aspiration of her knee this week showed over 2000 white blood cells.  There were no organisms.  Given this could be the source of infection, it was appropriate to proceed with an arthroscopic synovectomy, I and D of the knee.  I did explain this to  her mother in detail and the rationale behind proceeding with surgery and she did wish for [place] to proceed with the surgery and agreed after discussion of the risks and benefits of surgery.  DESCRIPTION OF PROCEDURE:  After informed consent was obtained, appropriate left knee was marked.  She was brought from the ICU intubated directly to the operating room.  She was placed supine on the operating table.  General anesthesia was then  obtained.  Her left knee, thigh, leg and ankle were prepped and draped with DuraPrep and sterile drapes including a sterile stockinette.  With the bed raised and lateral leg post utilized, the left operative knee was flexed off the side table.  A timeout  was called.  She was identified as  correct patient, correct left knee.  We then made an anterolateral arthroscopy portal and inserted a large cannula in the knee.  There was minimal fluid that came out of the knee.  We then placed the camera in the  knee, went to the medial compartment of the knee and made an anterior medial incision.  Through that incision, we placed arthroscopic shaver.  We found synovitis in all 3 compartments, but it was minimal.  There was no purulence that was encountered.   Using arthroscopic shaver, we performed a debridement of the soft tissue and synovium in all 3 compartments of the knee.  We found the ACL and PCL to be intact.  The medial and lateral meniscus showed some degenerative tearing and there was some  certainly worn cartilage within the knee, but this was seen even on previous MRI of her knee 4 months ago.  We debrided the suprapatellar area as well in Hoffa's fat pad.  We then allowed 3 liters of fluid to lavage the knee.  We then drained all fluid  from the knee and closed the portal sites with interrupted nylon suture after removing all instrumentation.  We placed Marcaine within the knee and the portal sites.  A well-padded sterile dressing was applied.  She was taken back to the ICU intubated.   There were no complications noted.    PAA D: 06/24/2021 5:55:14 pm T: 06/24/2021 10:35:00 pm  JOB: [id number]/ [id number]

## 2021-06-24 NOTE — Progress Notes (Signed)
  Echocardiogram Echocardiogram Transesophageal has been performed.  Debra Klein 06/24/2021, 3:27 PM

## 2021-06-24 NOTE — Progress Notes (Signed)
SLP Cancellation Note  Patient Details Name: Debra Klein MRN: [id number] DOB: [birth year]   Cancelled treatment:       Reason Eval/Treat Not Completed: Patient not medically ready. Pt remains on vent. Per chart, may consider extubation after TEE. Will f/u as able.    Osie Bond., M.A. [place] Office [phone]  Secure chat preferred  06/24/2021, 8:19 AM

## 2021-06-24 NOTE — Progress Notes (Signed)
Physical Therapy Treatment Patient Details Name: Debra Klein MRN: [id number] DOB: 09-Feb-1975 Today's Date: 06/24/2021   History of Present Illness 46 y.o. female presents to [facility] hospital 06/19/2021 with AMS, poor PO intake and recent falls, along with recentL ankle fx. Pt admitted with severes sepsis of unknown source. MRI 6/8 shows large L MCA infarct. Pt required intubation 6/8. PMH includes schizophrenia, HTN, anemia.    PT Comments    Pt lethargic today, worked on changing bed position and rolling which helped pt arouse for 10-15 secs at a time at which point pt followed 1-2 commands with LUE. PROM performed to RLE, minimal ROM to L knee as pt grimaced with all mvmt of LLE. If arousal level does not increase will decrease freq and review d/c plan but for now continue to rec AIR and keep 4x/wk. PT will continue to follow.    Recommendations for follow up therapy are one component of a multi-disciplinary discharge planning process, led by the attending physician.  Recommendations may be updated based on patient status, additional functional criteria and insurance authorization.  Follow Up Recommendations  Acute inpatient rehab (3hours/day)     Assistance Recommended at Discharge Frequent or constant Supervision/Assistance  Patient can return home with the following Two people to help with walking and/or transfers;Two people to help with bathing/dressing/bathroom;Assistance with cooking/housework;Assistance with feeding;Direct supervision/assist for medications management;Direct supervision/assist for financial management;Assist for transportation;Help with stairs or ramp for entrance   Equipment Recommendations   (defer to post acute)    Recommendations for Other Services Rehab consult     Precautions / Restrictions Precautions Precautions: Fall Precaution Comments: intubated, rectal pouch, purewick, NG tube, feeding tube oral with NG tube, bil mittens, Required Braces or Orthoses:  Splint/Cast Splint/Cast: L cam boot Splint/Cast - Date Prophylactic Dressing Applied (if applicable): 37/85/88 Restrictions Weight Bearing Restrictions: Yes LLE Weight Bearing: Weight bearing as tolerated     Mobility  Bed Mobility Overal bed mobility: Needs Assistance Bed Mobility: Rolling Rolling: Max assist, +2 for physical assistance         General bed mobility comments: bed placed in chair position for increased stimulation of pt and rolled in bed. Pt opened eyes and followed simple commands with L hand right after changes in position but when not mobilizing, fell back asleep    Transfers                   General transfer comment: too lethargic    Ambulation/Gait               General Gait Details: unable   Stairs             Wheelchair Mobility    Modified Rankin (Stroke Patients Only) Modified Rankin (Stroke Patients Only) Pre-Morbid Rankin Score: No significant disability Modified Rankin: Severe disability     Balance Overall balance assessment: Needs assistance Sitting-balance support: Single extremity supported, Feet unsupported Sitting balance-Leahy Scale: Zero Sitting balance - Comments: full support needed. Tried to facilitate sitting away from the back of the bed but pt not using LUE effectively enough to do this                                    Cognition Arousal/Alertness: Lethargic Behavior During Therapy: Flat affect Overall Cognitive Status: Impaired/Different from baseline Area of Impairment: Attention, Following commands, Awareness, Problem solving, Safety/judgement, Memory, Orientation  Current Attention Level: Focused   Following Commands: Follows one step commands inconsistently, Follows one step commands with increased time Safety/Judgement: Decreased awareness of deficits Awareness: Intellectual Problem Solving: Slow processing, Decreased initiation, Difficulty  sequencing, Requires verbal cues, Requires tactile cues General Comments: pt followed <10% of commands due to lethargy        Exercises General Exercises - Lower Extremity Ankle Circles/Pumps: PROM, Right, 10 reps, Supine Long Arc Quad: PROM, Right, 10 reps, Seated Heel Slides: PROM, Right, 10 reps, Supine Other Exercises Other Exercises: PROM cervical rotation, esp to stretch R neck as pt maintaining head twd vent Other Exercises: small mvmt RLE but pt grimacing with mvmt of R knee so motion minimized    General Comments General comments (skin integrity, edema, etc.): VSS, PEEP 5, FiO2 30      Pertinent Vitals/Pain Pain Assessment Pain Assessment: Faces Faces Pain Scale: Hurts little more Pain Location: L LE movement Pain Descriptors / Indicators: Grimacing Pain Intervention(s): Monitored during session    Home Living                          Prior Function            PT Goals (current goals can now be found in the care plan section) Acute Rehab PT Goals Patient Stated Goal: none stated PT Goal Formulation: With patient Time For Goal Achievement: 07/06/21 Potential to Achieve Goals: Fair Progress towards PT goals: Not progressing toward goals - comment (lethargy)    Frequency    Min 4X/week      PT Plan Current plan remains appropriate    Co-evaluation              AM-PAC PT "6 Clicks" Mobility   Outcome Measure  Help needed turning from your back to your side while in a flat bed without using bedrails?: Total Help needed moving from lying on your back to sitting on the side of a flat bed without using bedrails?: Total Help needed moving to and from a bed to a chair (including a wheelchair)?: Total Help needed standing up from a chair using your arms (e.g., wheelchair or bedside chair)?: Total Help needed to walk in hospital room?: Total Help needed climbing 3-5 steps with a railing? : Total 6 Click Score: 6    End of Session Equipment  Utilized During Treatment: Oxygen (vent) Activity Tolerance: Patient limited by lethargy Patient left: in bed;with call bell/phone within reach;with restraints reapplied Nurse Communication: Mobility status (request prevalon boots) PT Visit Diagnosis: Other abnormalities of gait and mobility (R26.89);Muscle weakness (generalized) (M62.81);Hemiplegia and hemiparesis Hemiplegia - Right/Left: Right Hemiplegia - caused by: Cerebral infarction     Time: 5003-7048 PT Time Calculation (min) (ACUTE ONLY): 27 min  Charges:  $Therapeutic Exercise: 8-22 mins $Therapeutic Activity: 8-22 mins                     [place]  Pager [phone] Office [place] 06/24/2021, 12:14 PM

## 2021-06-24 NOTE — H&P (View-Only) (Signed)
Reason for Consult:endocarditis Referring Physician: Dr. Lethea Killings is an 46 y.o. female.  HPI: 46 yo woman with history of hypertension, hyperlipidemia, anemia and schizophrenia presented with altered mental status, general malaise, anorexia and multiple falls.  Intubated in ED.. Workup revealed Left MCA infarct and septic left knee joint.  Echo showed moderate to severe MR with small vegetation and moderate TR.  Blood cultures growing methacillin sensitive staph aureus.  oN Nafcillin IV.    Past Medical History:  Diagnosis Date   Hyperlipemia    Hypertension    IDA (iron deficiency anemia)    Schizophrenia ([place])     Past Surgical History:  Procedure Laterality Date   No previous surgery      History reviewed. No pertinent family history.  Social History:  has no history on file for tobacco use, alcohol use, and drug use.  Allergies: No Known Allergies  Medications: Scheduled:  aspirin  81 mg Per Tube Daily   atorvastatin  40 mg Per Tube Daily   chlorhexidine gluconate (MEDLINE KIT)  15 mL Mouth Rinse BID   Chlorhexidine Gluconate Cloth  6 each Topical Daily   cloZAPine  100 mg Per Tube Daily   enoxaparin (LOVENOX) injection  40 mg Subcutaneous Q24H   feeding supplement (PROSource TF)  45 mL Per Tube TID   fiber  1 packet Per Tube BID   fluPHENAZine  2.5 mg Per Tube QHS   insulin aspart  1-3 Units Subcutaneous Q4H   mouth rinse  15 mL Mouth Rinse 10 times per day   mupirocin ointment  1 application  Nasal BID   pantoprazole sodium  40 mg Per Tube Daily   vitamin B-12  1,000 mcg Per Tube Daily    Results for orders placed or performed during the hospital encounter of 06/19/21 (from the past 48 hour(s))  Glucose, capillary     Status: None   Collection Time: 06/22/21 11:56 AM  Result Value Ref Range   Glucose-Capillary 94 70 - 99 mg/dL    Comment: Glucose reference range applies only to samples taken after fasting for at least 8 hours.  Glucose, capillary      Status: None   Collection Time: 06/22/21  3:11 PM  Result Value Ref Range   Glucose-Capillary 86 70 - 99 mg/dL    Comment: Glucose reference range applies only to samples taken after fasting for at least 8 hours.  Glucose, capillary     Status: Abnormal   Collection Time: 06/22/21  8:14 PM  Result Value Ref Range   Glucose-Capillary 126 (H) 70 - 99 mg/dL    Comment: Glucose reference range applies only to samples taken after fasting for at least 8 hours.  Glucose, capillary     Status: Abnormal   Collection Time: 06/23/21 12:01 AM  Result Value Ref Range   Glucose-Capillary 141 (H) 70 - 99 mg/dL    Comment: Glucose reference range applies only to samples taken after fasting for at least 8 hours.  Glucose, capillary     Status: None   Collection Time: 06/23/21  4:06 AM  Result Value Ref Range   Glucose-Capillary 97 70 - 99 mg/dL    Comment: Glucose reference range applies only to samples taken after fasting for at least 8 hours.  Triglycerides     Status: Abnormal   Collection Time: 06/23/21  6:20 AM  Result Value Ref Range   Triglycerides 157 (H) <150 mg/dL    Comment: Performed at [facility], [place] [address], [place], [place] [address]  Magnesium     Status: None   Collection Time: 06/23/21  6:20 AM  Result Value Ref Range   Magnesium 1.8 1.7 - 2.4 mg/dL    Comment: Performed at [facility] [address] [place], [place] [address]  Phosphorus     Status: None   Collection Time: 06/23/21  6:20 AM  Result Value Ref Range   Phosphorus 3.5 2.5 - 4.6 mg/dL    Comment: Performed at [facility] [address], [place], [place] [address]  CBC with Differential/Platelet     Status: Abnormal   Collection Time: 06/23/21  6:20 AM  Result Value Ref Range   WBC 19.5 (H) 4.0 - 10.5 K/uL   RBC 3.70 (L) 3.87 - 5.11 MIL/uL   Hemoglobin 9.8 (L) 12.0 - 15.0 g/dL   HCT 29.4 (L) 36.0 - 46.0 %   MCV 79.5 (L) 80.0 - 100.0 fL   MCH 26.5 26.0 - 34.0 pg   MCHC 33.3 30.0 -  36.0 g/dL   RDW 18.1 (H) 11.5 - 15.5 %   Platelets 179 150 - 400 K/uL   nRBC 0.0 0.0 - 0.2 %   Neutrophils Relative % 70 %   Neutro Abs 13.6 (H) 1.7 - 7.7 K/uL   Lymphocytes Relative 11 %   Lymphs Abs 2.2 0.7 - 4.0 K/uL   Monocytes Relative 8 %   Monocytes Absolute 1.5 (H) 0.1 - 1.0 K/uL   Eosinophils Relative 0 %   Eosinophils Absolute 0.0 0.0 - 0.5 K/uL   Basophils Relative 0 %   Basophils Absolute 0.1 0.0 - 0.1 K/uL   WBC Morphology      MODERATE LEFT SHIFT (>5% METAS AND MYELOS,OCC PRO NOTED)   RBC Morphology See Note    Smear Review MORPHOLOGY UNREMARKABLE    Immature Granulocytes 11 %   Abs Immature Granulocytes 2.15 (H) 0.00 - 0.07 K/uL   Target Cells PRESENT     Comment: Performed at [facility], [place] [address] [place], [place] [address]  Basic metabolic panel     Status: Abnormal   Collection Time: 06/23/21  6:20 AM  Result Value Ref Range   Sodium 143 135 - 145 mmol/L   Potassium 4.4 3.5 - 5.1 mmol/L    Comment: DELTA CHECK NOTED   Chloride 111 98 - 111 mmol/L   CO2 23 22 - 32 mmol/L   Glucose, Bld 96 70 - 99 mg/dL    Comment: Glucose reference range applies only to samples taken after fasting for at least 8 hours.   BUN 21 (H) 6 - 20 mg/dL   Creatinine, Ser 0.88 0.44 - 1.00 mg/dL   Calcium 8.3 (L) 8.9 - 10.3 mg/dL   GFR, Estimated >60 >60 mL/min    Comment: (NOTE) Calculated using the CKD-EPI Creatinine Equation (2021)    Anion gap 9 5 - 15    Comment: Performed at [facility] [address], [place], [place] [address]  Glucose, capillary     Status: None   Collection Time: 06/23/21  7:54 AM  Result Value Ref Range   Glucose-Capillary 98 70 - 99 mg/dL    Comment: Glucose reference range applies only to samples taken after fasting for at least 8 hours.  Body fluid culture w Gram Stain     Status: None (Preliminary result)   Collection Time: 06/23/21 11:34 AM   Specimen: Body Fluid  Result Value Ref Range   Specimen Description FLUID     Special Requests SYNOVIAL LEFT KNEE    Gram Stain      RARE WBC PRESENT,BOTH PMN AND MONONUCLEAR NO ORGANISMS SEEN Performed at [facility], [address], [place], [place] [address]    Culture PENDING    Report Status PENDING   Synovial cell count + diff, w/ crystals     Status: Abnormal   Collection Time: 06/23/21 11:34 AM  Result Value Ref Range   Color, Synovial ORANGE (A) YELLOW   Appearance-Synovial TURBID (A) CLEAR   Crystals, Fluid NO CRYSTALS SEEN    WBC, Synovial 225,000 (H) 0 - 200 /cu mm   Neutrophil, Synovial 88 (H) 0 - 25 %   Lymphocytes-Synovial Fld 6 0 - 20 %   Monocyte-Macrophage-Synovial Fluid 6 (L) 50 - 90 %   Eosinophils-Synovial 0 0 - 1 %    Comment: Performed at [facility] [address] [place], [place] [address]  Glucose, capillary     Status: None   Collection Time: 06/23/21 11:42 AM  Result Value Ref Range   Glucose-Capillary 96 70 - 99 mg/dL    Comment: Glucose reference range applies only to samples taken after fasting for at least 8 hours.  Glucose, capillary     Status: None   Collection Time: 06/23/21  3:51 PM  Result Value Ref Range   Glucose-Capillary 89 70 - 99 mg/dL    Comment: Glucose reference range applies only to samples taken after fasting for at least 8 hours.  Glucose, capillary     Status: None   Collection Time: 06/23/21  7:55 PM  Result Value Ref Range   Glucose-Capillary 98 70 - 99 mg/dL    Comment: Glucose reference range applies only to samples taken after fasting for at least 8 hours.  Glucose, capillary     Status: Abnormal   Collection Time: 06/23/21 11:52 PM  Result Value Ref Range   Glucose-Capillary 110 (H) 70 - 99 mg/dL    Comment: Glucose reference range applies only to samples taken after fasting for at least 8 hours.  CBC with Differential/Platelet     Status: Abnormal   Collection Time: 06/24/21  2:44 AM  Result Value Ref Range   WBC 23.2 (H) 4.0 - 10.5 K/uL   RBC 3.05 (L) 3.87 - 5.11 MIL/uL    Hemoglobin 8.0 (L) 12.0 - 15.0 g/dL   HCT 24.7 (L) 36.0 - 46.0 %   MCV 81.0 80.0 - 100.0 fL   MCH 26.2 26.0 - 34.0 pg   MCHC 32.4 30.0 - 36.0 g/dL   RDW 18.6 (H) 11.5 - 15.5 %   Platelets 199 150 - 400 K/uL   nRBC 0.0 0.0 - 0.2 %   Neutrophils Relative % 81 %   Neutro Abs 18.8 (H) 1.7 - 7.7 K/uL   Lymphocytes Relative 8 %   Lymphs Abs 1.9 0.7 - 4.0 K/uL   Monocytes Relative 5 %   Monocytes Absolute 1.2 (H) 0.1 - 1.0 K/uL   Eosinophils Relative 0 %   Eosinophils Absolute 0.0 0.0 - 0.5 K/uL   Basophils Relative 0 %   Basophils Absolute 0.0 0.0 - 0.1 K/uL   WBC Morphology MILD LEFT SHIFT (1-5% METAS, OCC MYELO, OCC BANDS)    nRBC 0 0 /100 WBC   Metamyelocytes Relative 1 %   Myelocytes 3 %   Promyelocytes Relative 1 %   Blasts 1 %  Abs Immature Granulocytes 1.20 (H) 0.00 - 0.07 K/uL    Comment: Performed at [facility], [place] [address], [place], [place] [address]  Glucose, capillary     Status: Abnormal   Collection Time: 06/24/21  4:08 AM  Result Value Ref Range   Glucose-Capillary 110 (H) 70 - 99 mg/dL    Comment: Glucose reference range applies only to samples taken after fasting for at least 8 hours.  Glucose, capillary     Status: Abnormal   Collection Time: 06/24/21  8:13 AM  Result Value Ref Range   Glucose-Capillary 105 (H) 70 - 99 mg/dL    Comment: Glucose reference range applies only to samples taken after fasting for at least 8 hours.    DG CHEST PORT 1 VIEW  Result Date: 06/24/2021 CLINICAL DATA:  Sepsis. Acute respiratory failure. Endotracheally intubated. EXAM: PORTABLE CHEST 1 VIEW COMPARISON:  06/19/2021 FINDINGS: Endotracheal tube and nasogastric tube remain in appropriate position. Increased subsegmental atelectasis is seen in both mid lung zones. No evidence of pulmonary consolidation or pleural effusion. IMPRESSION: Increased bilateral subsegmental atelectasis. Electronically Signed   By: Marlaine Hind M.D.   On: 06/24/2021 08:17    ECHOCARDIOGRAM  06/20/2021 IMPRESSIONS     1. Left ventricular ejection fraction, by estimation, is 50%. The left  ventricle has mildly decreased function. The left ventricle demonstrates  global hypokinesis. Left ventricular diastolic parameters are consistent  with Grade I diastolic dysfunction  (impaired relaxation).   2. Right ventricular systolic function is normal. The right ventricular  size is normal. There is mildly elevated pulmonary artery systolic  pressure. The estimated right ventricular systolic pressure is 93.8 mmHg.   3. Small 10 x 6 mm vegetation noted on the mitral valve. The mitral valve  is abnormal. Moderate to severe mitral valve regurgitation not fully  visualized due to eccentricity, splay artifact present suggests  significant MR. No evidence of mitral  stenosis.   4. Tricuspid valve regurgitation is moderate.   5. The aortic valve is tricuspid. Aortic valve regurgitation is not  visualized. No aortic stenosis is present.   6. The inferior vena cava is dilated in size with <50% respiratory  variability, suggesting right atrial pressure of 15 mmHg.   7. Suggest TEE to assess vegetation and mitral regurgitation.   I personally reviewed the echo images. + MR, small vegetation, moderate TR, EF 50% Review of Systems  Unable to perform ROS: Intubated   Blood pressure 114/70, pulse 85, temperature 100.3 F (37.9 C), temperature source Axillary, resp. rate 15, height 5' 6" (1.676 m), weight 87.8 kg, SpO2 100 %. Physical Exam Constitutional:      Appearance: She is ill-appearing.  HENT:     Head: Normocephalic and atraumatic.  Cardiovascular:     Rate and Rhythm: Normal rate.     Heart sounds: Murmur (3/6 holosystolic) heard.  Pulmonary:     Breath sounds: No wheezing or rales.     Comments: Clear anteriorly Abdominal:     Palpations: Abdomen is soft.     Tenderness: There is no abdominal tenderness.  Skin:    General: Skin is warm and dry.  Neurological:     Mental  Status: She is alert.     Motor: Weakness (right hemiparesis) present.     Comments: Inconsistent following commands.    Assessment/Plan: 46 yo woman with hypertension, hyperlipidemia, Schizophrenia presented with sepsis and CVA. Workup has shown MSSA bacteremia, moderate to severe MR with small vegetation, and infected left  knee joint.  She is currently hemodynamically stable.. Still having fevers and WBC trending upward.  Vent only on 30% FiO2 and lungs clear on exam.  To have TEE today.  Also scheduled for washout of left knee in OR later today.  Will follow  Melrose Nakayama 06/24/2021, 8:31 AM

## 2021-06-24 NOTE — Progress Notes (Signed)
Patient ID: Debra Klein, female   DOB: 03-Mar-1975, 46 y.o.   MRN: [id number] I spoke with the patient's mother again at the bedside today.  She understands that we will proceed to surgery later today for a washout of her left knee given the high white blood cell count in her left knee.  I did try to aspirate more fluid off the knee but there is really not much left in the knee itself.  Its not significantly swollen but given the high white blood count from the aspiration, it feels necessary to go ahead and wash her knee out.  The Gram stain was negative just for which is to be expected given that she has been on antibiotics.  The surgery itself should only take 30 minutes or less.  Informed consent has been obtained.

## 2021-06-24 NOTE — Transfer of Care (Signed)
Immediate Anesthesia Transfer of Care Note  Patient: Debra Klein  Procedure(s) Performed: ARTHROSCOPY LEFT KNEE IRRIGRATION AND DEBRIEDMENT AND SYNOVECTOMY (Left: Knee)  Patient Location: ICU  Anesthesia Type:General  Level of Consciousness: Patient remains intubated per anesthesia plan  Airway & Oxygen Therapy: Patient remains intubated per anesthesia plan  Post-op Assessment: Report given to RN and Post -op Vital signs reviewed and stable  Post vital signs: Reviewed and stable  Last Vitals:  Vitals Value Taken Time  BP 109/67   Temp    Pulse 98   Resp 12   SpO2 100     Last Pain:  Vitals:   06/24/21 1600  TempSrc: Axillary         Complications: No notable events documented.

## 2021-06-24 NOTE — Anesthesia Preprocedure Evaluation (Addendum)
Anesthesia Evaluation  Patient identified by MRN, date of birth, ID band Patient unresponsive    Reviewed: Allergy & Precautions, Patient's Chart, lab work & pertinent test results, Unable to perform ROS - Chart review only  Airway Mallampati: Intubated       Dental   Pulmonary  Intubated, on vent   breath sounds clear to auscultation       Cardiovascular hypertension,  Rhythm:Regular  Echo 06/20/2021 IMPRESSIONS   1. Left ventricular ejection fraction, by estimation, is 50%. The left ventricle has mildly decreased function. The left ventricle demonstrates global hypokinesis. Left ventricular diastolic parameters are consistent with Grade I diastolic dysfunction  (impaired relaxation). 2. Right ventricular systolic function is normal. The right ventricular size is normal. There is mildly elevated pulmonary artery systolic pressure. The estimated right ventricular systolic pressure is 85.6 mmHg. 3. Small 10 x 6 mm vegetation noted on the mitral valve. The mitral valve is abnormal. Moderate to severe mitral valve regurgitation not fully visualized due to eccentricity, splay artifact present suggests significant MR. No evidence of mitral  stenosis. 4. Tricuspid valve regurgitation is moderate. 5. The aortic valve is tricuspid. Aortic valve regurgitation is not visualized. No aortic stenosis is present. 6. The inferior vena cava is dilated in size with <50% respiratory variability, suggesting right atrial pressure of 15 mmHg. 7. Suggest TEE to assess vegetation and mitral regurgitation.    Neuro/Psych PSYCHIATRIC DISORDERS Schizophrenia Acute CVA  CVA    GI/Hepatic negative GI ROS, Neg liver ROS,   Endo/Other  negative endocrine ROS  Renal/GU Renal disease     Musculoskeletal   Abdominal   Peds  Hematology  (+) Blood dyscrasia, anemia ,   Anesthesia Other Findings   Reproductive/Obstetrics                             Anesthesia Physical Anesthesia Plan  ASA: 4  Anesthesia Plan: General   Post-op Pain Management: Minimal or no pain anticipated   Induction:   PONV Risk Score and Plan: Treatment may vary due to age or medical condition  Airway Management Planned: Oral ETT  Additional Equipment:   Intra-op Plan:   Post-operative Plan: Post-operative intubation/ventilation  Informed Consent:     History available from chart only  Plan Discussed with: Anesthesiologist  Anesthesia Plan Comments:        Anesthesia Quick Evaluation

## 2021-06-25 ENCOUNTER — Inpatient Hospital Stay (HOSPITAL_COMMUNITY): Payer: Medicare Other

## 2021-06-25 ENCOUNTER — Encounter (HOSPITAL_COMMUNITY): Payer: Self-pay | Admitting: Orthopaedic Surgery

## 2021-06-25 DIAGNOSIS — J9601 Acute respiratory failure with hypoxia: Secondary | ICD-10-CM | POA: Diagnosis not present

## 2021-06-25 DIAGNOSIS — B9561 Methicillin susceptible Staphylococcus aureus infection as the cause of diseases classified elsewhere: Secondary | ICD-10-CM | POA: Diagnosis not present

## 2021-06-25 DIAGNOSIS — G9341 Metabolic encephalopathy: Secondary | ICD-10-CM | POA: Diagnosis not present

## 2021-06-25 DIAGNOSIS — I339 Acute and subacute endocarditis, unspecified: Secondary | ICD-10-CM | POA: Diagnosis not present

## 2021-06-25 DIAGNOSIS — R6520 Severe sepsis without septic shock: Secondary | ICD-10-CM | POA: Diagnosis not present

## 2021-06-25 DIAGNOSIS — G9340 Encephalopathy, unspecified: Secondary | ICD-10-CM | POA: Diagnosis not present

## 2021-06-25 DIAGNOSIS — M009 Pyogenic arthritis, unspecified: Secondary | ICD-10-CM

## 2021-06-25 DIAGNOSIS — N179 Acute kidney failure, unspecified: Secondary | ICD-10-CM | POA: Diagnosis not present

## 2021-06-25 DIAGNOSIS — A4101 Sepsis due to Methicillin susceptible Staphylococcus aureus: Secondary | ICD-10-CM | POA: Diagnosis not present

## 2021-06-25 DIAGNOSIS — I63412 Cerebral infarction due to embolism of left middle cerebral artery: Secondary | ICD-10-CM | POA: Diagnosis not present

## 2021-06-25 DIAGNOSIS — R7881 Bacteremia: Secondary | ICD-10-CM | POA: Diagnosis not present

## 2021-06-25 DIAGNOSIS — I340 Nonrheumatic mitral (valve) insufficiency: Secondary | ICD-10-CM | POA: Diagnosis not present

## 2021-06-25 DIAGNOSIS — I330 Acute and subacute infective endocarditis: Secondary | ICD-10-CM | POA: Diagnosis not present

## 2021-06-25 LAB — CBC WITH DIFFERENTIAL/PLATELET
Abs Immature Granulocytes: 0.7 10*3/uL — ABNORMAL HIGH (ref 0.00–0.07)
Basophils Absolute: 0 10*3/uL (ref 0.0–0.1)
Basophils Relative: 0 %
Eosinophils Absolute: 0 10*3/uL (ref 0.0–0.5)
Eosinophils Relative: 0 %
HCT: 21.8 % — ABNORMAL LOW (ref 36.0–46.0)
Hemoglobin: 7 g/dL — ABNORMAL LOW (ref 12.0–15.0)
Lymphocytes Relative: 4 %
Lymphs Abs: 0.9 10*3/uL (ref 0.7–4.0)
MCH: 26.4 pg (ref 26.0–34.0)
MCHC: 32.1 g/dL (ref 30.0–36.0)
MCV: 82.3 fL (ref 80.0–100.0)
Metamyelocytes Relative: 1 %
Monocytes Absolute: 0.9 10*3/uL (ref 0.1–1.0)
Monocytes Relative: 4 %
Myelocytes: 2 %
Neutro Abs: 19.5 10*3/uL — ABNORMAL HIGH (ref 1.7–7.7)
Neutrophils Relative %: 89 %
Platelets: 254 10*3/uL (ref 150–400)
RBC: 2.65 MIL/uL — ABNORMAL LOW (ref 3.87–5.11)
RDW: 18.6 % — ABNORMAL HIGH (ref 11.5–15.5)
WBC: 21.9 10*3/uL — ABNORMAL HIGH (ref 4.0–10.5)
nRBC: 0 % (ref 0.0–0.2)
nRBC: 0 /100 WBC

## 2021-06-25 LAB — BASIC METABOLIC PANEL
Anion gap: 10 (ref 5–15)
BUN: 21 mg/dL — ABNORMAL HIGH (ref 6–20)
CO2: 21 mmol/L — ABNORMAL LOW (ref 22–32)
Calcium: 8.1 mg/dL — ABNORMAL LOW (ref 8.9–10.3)
Chloride: 109 mmol/L (ref 98–111)
Creatinine, Ser: 0.9 mg/dL (ref 0.44–1.00)
GFR, Estimated: >60 mL/min (ref >60)
Glucose, Bld: 103 mg/dL — ABNORMAL HIGH (ref 70–99)
Potassium: 4.2 mmol/L (ref 3.5–5.1)
Sodium: 140 mmol/L (ref 135–145)

## 2021-06-25 LAB — BODY FLUID CULTURE W GRAM STAIN

## 2021-06-25 LAB — GLUCOSE, CAPILLARY
Glucose-Capillary: 122 mg/dL — ABNORMAL HIGH (ref 70–99)
Glucose-Capillary: 116 mg/dL — ABNORMAL HIGH (ref 70–99)
Glucose-Capillary: 120 mg/dL — ABNORMAL HIGH (ref 70–99)
Glucose-Capillary: 122 mg/dL — ABNORMAL HIGH (ref 70–99)

## 2021-06-25 MED ORDER — ATORVASTATIN CALCIUM 40 MG PO TABS
40.0000 mg | ORAL_TABLET | Freq: Every day | ORAL | Status: DC
Start: 2021-06-25 — End: 2021-06-27
  Administered 2021-06-25 – 2021-06-26 (×2): 40 mg
  Filled 2021-06-25 (×2): qty 1

## 2021-06-25 MED ORDER — METHYLPREDNISOLONE SODIUM SUCC 125 MG IJ SOLR
125.0000 mg | Freq: Once | INTRAMUSCULAR | Status: AC
  Administered 2021-06-25: 125 mg via INTRAVENOUS
  Filled 2021-06-25: qty 2

## 2021-06-25 MED ORDER — METHYLPREDNISOLONE SODIUM SUCC 40 MG IJ SOLR
40.0000 mg | INTRAMUSCULAR | Status: AC
  Administered 2021-06-25 – 2021-06-26 (×4): 40 mg via INTRAVENOUS
  Filled 2021-06-25 (×4): qty 1

## 2021-06-25 NOTE — Progress Notes (Signed)
I updated Ms. [place]'s mother and cousin Inaya Nevin. They understand that there are no additional planned procedures and she has passed her spontaneous breathing trials. Although I worry she may not protect her airway, it is reasonable to extubate and allow her to try. If she develops worsening respiratory failure due to aspiration, she would be intubated and we would proceed with tracheostomy in the coming days. They agree and want to pursue aggressive treatment to ensure maximal recovery.  Julian Hy, DO 06/25/21 11:45 AM [facility]

## 2021-06-25 NOTE — Progress Notes (Signed)
Physical Therapy Treatment Patient Details Name: Debra Klein MRN: [id number] DOB: 05-24-1975 Today's Date: 06/25/2021   History of Present Illness 46 y.o. female presents to [facility] 06/19/2021 with AMS, poor PO intake and recent falls, along with recentL ankle fx. Pt admitted with severes sepsis of unknown source. MRI 6/8 shows large L MCA infarct. Pt required intubation 6/8. PMH includes schizophrenia, HTN, anemia.    PT Comments    The pt was in bed upon arrival, opening eyes to stimulation but with generally increased lethargy from last session. The pt continues to assist with supine-sit transition using core, but did not assist with either UE or LE to complete movement. The pt is dependent on mod-maxA to maintain static sitting, and did not follow any commands despite max multimodal cues and stimulation. Will continue to benefit from skilled PT as able to tolerate.    Recommendations for follow up therapy are one component of a multi-disciplinary discharge planning process, led by the attending physician.  Recommendations may be updated based on patient status, additional functional criteria and insurance authorization.  Follow Up Recommendations  Acute inpatient rehab (3hours/day)     Assistance Recommended at Discharge Frequent or constant Supervision/Assistance  Patient can return home with the following Two people to help with walking and/or transfers;Two people to help with bathing/dressing/bathroom;Assistance with cooking/housework;Assistance with feeding;Direct supervision/assist for medications management;Direct supervision/assist for financial management;Assist for transportation;Help with stairs or ramp for entrance   Equipment Recommendations   (defer to post acute)    Recommendations for Other Services Rehab consult     Precautions / Restrictions Precautions Precautions: Fall Precaution Comments: intubated, rectal pouch, purewick, NG tube, feeding tube oral with NG  tube, bil mittens, Required Braces or Orthoses: Splint/Cast Splint/Cast: L cam boot Splint/Cast - Date Prophylactic Dressing Applied (if applicable): 95/28/41 Restrictions Weight Bearing Restrictions: Yes LLE Weight Bearing: Weight bearing as tolerated     Mobility  Bed Mobility Overal bed mobility: Needs Assistance Bed Mobility: Rolling Rolling: +2 for physical assistance, Total assist   Supine to sit: Max assist, +2 for physical assistance Sit to supine: Total assist, +2 for physical assistance   General bed mobility comments: pt assisting some with core to complete transition from supine to sitting, no assist with LE or UE to complete movement. leaning/falling to R once in seated position, maxA to maintain static sitting    Transfers                   General transfer comment: too lethargic    Ambulation/Gait               General Gait Details: unable   Modified Rankin (Stroke Patients Only) Modified Rankin (Stroke Patients Only) Pre-Morbid Rankin Score: No significant disability Modified Rankin: Severe disability     Balance Overall balance assessment: Needs assistance Sitting-balance support: Single extremity supported, Feet unsupported Sitting balance-Leahy Scale: Zero Sitting balance - Comments: pt needing mod-maxA to maintain static sitting. R lateral lean (and some posterior) with no initiation from pt to correct Postural control: Right lateral lean                                  Cognition Arousal/Alertness: Lethargic Behavior During Therapy: Flat affect Overall Cognitive Status: Difficult to assess Area of Impairment: Attention, Following commands                   Current Attention Level:  Focused   Following Commands: Follows one step commands inconsistently, Follows one step commands with increased time       General Comments: pt following no commands at this time, did not attend to stimuli even with max cues,  no visual tracking noted        Exercises Other Exercises Other Exercises: PROM cervical rotation, esp to stretch R neck as pt maintaining head twd vent (R side)    General Comments General comments (skin integrity, edema, etc.): VSS on vent, HR to 120s, FiO2 30%, PEEP 5      Pertinent Vitals/Pain Pain Assessment Pain Assessment: Faces Faces Pain Scale: Hurts a little bit Pain Location: LLE with any movement Pain Descriptors / Indicators: Grimacing Pain Intervention(s): Limited activity within patient's tolerance, Monitored during session, Repositioned     PT Goals (current goals can now be found in the care plan section) Acute Rehab PT Goals Patient Stated Goal: none stated PT Goal Formulation: With patient/family Time For Goal Achievement: 07/06/21 Potential to Achieve Goals: Fair Progress towards PT goals: Not progressing toward goals - comment (increased lethargy)    Frequency    Min 4X/week      PT Plan Current plan remains appropriate       AM-PAC PT "6 Clicks" Mobility   Outcome Measure  Help needed turning from your back to your side while in a flat bed without using bedrails?: Total Help needed moving from lying on your back to sitting on the side of a flat bed without using bedrails?: Total Help needed moving to and from a bed to a chair (including a wheelchair)?: Total Help needed standing up from a chair using your arms (e.g., wheelchair or bedside chair)?: Total Help needed to walk in hospital room?: Total Help needed climbing 3-5 steps with a railing? : Total 6 Click Score: 6    End of Session Equipment Utilized During Treatment: Oxygen (vent) Activity Tolerance: Patient limited by lethargy Patient left: in bed;with call bell/phone within reach;with restraints reapplied;with family/visitor present Nurse Communication: Mobility status (reduced command following) PT Visit Diagnosis: Other abnormalities of gait and mobility (R26.89);Muscle weakness  (generalized) (M62.81);Hemiplegia and hemiparesis Hemiplegia - Right/Left: Right Hemiplegia - caused by: Cerebral infarction     Time: 7124-5809 PT Time Calculation (min) (ACUTE ONLY): 30 min  Charges:  $Therapeutic Activity: 8-22 mins                     West Carbo, PT, DPT   Acute Rehabilitation Department   Sandra Cockayne 06/25/2021, 12:59 PM

## 2021-06-25 NOTE — Progress Notes (Signed)
SLP Cancellation Note  Patient Details Name: Debra Klein MRN: [id number] DOB: [birth year]   Cancelled treatment:       Reason Eval/Treat Not Completed: Patient not medically ready (SLP has been following pt since order for speech-language evaluation was receievd on 6/9. Pt remain on the vent and SLP will sign off at this time. Please reconsult when/if clinically indicated.)  Triton Heidrich I. Hardin Negus, [place], [place] Office number [phone] Pager [place] 06/25/2021, 8:28 AM

## 2021-06-25 NOTE — Progress Notes (Signed)
Holding off on extubation for now due to no cuff leak per Dr. Carlis Abbott.

## 2021-06-25 NOTE — Progress Notes (Signed)
[place] for Infectious Disease  Date of Admission:  06/19/2021   Total days of inpatient antibiotics 5  Principal Problem:   Sepsis ([place]) Active Problems:   Schizophrenia ([place])   Transaminitis   AKI (acute kidney injury) ([place])   Bacteremia due to methicillin susceptible Staphylococcus aureus (MSSA)   Toxic encephalopathy   Cerebrovascular accident (CVA) due to embolic occlusion of left middle cerebral artery ([place])   Acute respiratory failure with hypoxia (H[facility])   Septic arthritis of knee, left [facility]          Assessment: 46 year old female with schizophrenia admitted with altered mental status.  Intubated in the ED, found to have CVA with MSSA bacteremia.  On arrival, she had temp of 101.2, WBC 15 K.  #MSSA bacteremia with native mitral valve endocarditis with abscess with mets to brain and left knee (septic arthritis) and LLE abscesses #Hx cortisone shot in left knee 2 weeks prior to admission(likely what seeded the bacteremia) #Left Patellar tendon repair-infected #AMS #CVA #Fever #Leukocytosis #Hx of Falls -CTH on 6/8 showed loss of gray white matter alone with insula concerning for infarction -CT on head neck 6/9 showed left MCA branch in the insula. No embolic source in the eck identified -Blood Cx from admission returned + for MSSA. ID engaged -On 6/9: I spoke with mother(whom pt lives with) and sister. PT had not been feeling well(eating less, and not responding to questions at times) for about a week. She had a cortisone shot in left knee about 2 weeks ago. Had dental procedure(filling) one weeks ago. Also noted to be having falls over the last week.  -MRI of the left knee showed periarticular OM most severely affecting th tibia, mild joint effusion,  several abscesses of LLE in popliteus muscle, anterior musculature, anterolateral knee, infected patellar tendon repair. CT face sinus did not show signs of abscess -TTE showed 10 x 24m vegetation on mitral  valve, moderate to sever mitral valve regurgitation. TEE showed large, highly mobile vegetation on posterior mitral valve leaflet with surrounding abscess.  -SP left knee arthroscopy with debridement and synovectomy for all three compartment on 6/13 with Cx+ staph aureus Recommendations:  -Continue nafcillin -Follow repeat blood Cx form 6/10 to ensure clearance -Follow OR Cx -Follow-up CTS recommendations  Microbiology:   Antibiotics: Vancomycin acyclovir and ceftriaxone 6/8-p  Nafcillin Cultures: Blood 6/8 2/2 MSSA 6/10 NGTD x2   SUBJECTIVE: Resting in bed intubated, able to open eyes. Responds to pain.  Interval : Temp 100.8 overnight with wbc 21.9k  Review of Systems: Review of Systems  Unable to perform ROS: Intubated     Scheduled Meds:  atorvastatin  40 mg Per Tube Daily   chlorhexidine gluconate (MEDLINE KIT)  15 mL Mouth Rinse BID   Chlorhexidine Gluconate Cloth  6 each Topical Daily   cloZAPine  100 mg Per Tube Daily   enoxaparin (LOVENOX) injection  40 mg Subcutaneous Q24H   feeding supplement (PROSource TF)  45 mL Per Tube TID   fiber  1 packet Per Tube BID   fluPHENAZine  2.5 mg Per Tube QHS   insulin aspart  1-3 Units Subcutaneous Q4H   mouth rinse  15 mL Mouth Rinse 10 times per day   pantoprazole sodium  40 mg Per Tube Daily   vitamin B-12  1,000 mcg Per Tube Daily   Continuous Infusions:  feeding supplement (OSMOLITE 1.5 CAL) 55 mL/hr at 06/25/21 1300   nafcillin 12 g in sodium  chloride 0.9 % 500 mL continuous infusion 20.8 mL/hr at 06/25/21 1300   PRN Meds:.acetaminophen, docusate, fentaNYL (SUBLIMAZE) injection, fentaNYL (SUBLIMAZE) injection, midazolam, polyethylene glycol, white petrolatum No Known Allergies  OBJECTIVE: Vitals:   06/25/21 1100 06/25/21 1200 06/25/21 1222 06/25/21 1300  BP: (!) 132/113 124/67  120/72  Pulse: (!) 123 99 100 98  Resp: (!) _0 Temp:  98.8 F (37.1 C)    TempSrc:  Oral    SpO2: 99% 100% 100% 100%   Weight:      Height:       Body mass index is 31.67 kg/m.  Physical Exam Constitutional:      Comments: Intubated  HENT:     Head: Normocephalic and atraumatic.     Right Ear: Tympanic membrane normal.     Left Ear: Tympanic membrane normal.     Nose: Nose normal.     Mouth/Throat:     Mouth: Mucous membranes are moist.  Eyes:     Extraocular Movements: Extraocular movements intact.     Conjunctiva/sclera: Conjunctivae normal.     Pupils: Pupils are equal, round, and reactive to light.  Cardiovascular:     Rate and Rhythm: Normal rate and regular rhythm.     Heart sounds: No murmur heard.    No friction rub. No gallop.  Pulmonary:     Effort: Pulmonary effort is normal.     Breath sounds: Normal breath sounds.  Abdominal:     General: Abdomen is flat.     Palpations: Abdomen is soft.  Musculoskeletal:        General: Normal range of motion.  Skin:    General: Skin is warm and dry.  Neurological:     General: No focal deficit present.  Psychiatric:        Mood and Affect: Mood normal.       Lab Results Lab Results  Component Value Date   WBC 21.9 (H) 06/25/2021   HGB 7.0 (L) 06/25/2021   HCT 21.8 (L) 06/25/2021   MCV 82.3 06/25/2021   PLT 254 06/25/2021    Lab Results  Component Value Date   CREATININE 0.90 06/25/2021   BUN 21 (H) 06/25/2021   NA 140 06/25/2021   K 4.2 06/25/2021   CL 109 06/25/2021   CO2 21 (L) 06/25/2021    Lab Results  Component Value Date   ALT 63 (H) 06/20/2021   AST 85 (H) 06/20/2021   ALKPHOS 129 (H) 06/20/2021   BILITOT 1.6 (H) 06/20/2021        Laurice Record, [place] for Infectious Disease [facility] 06/25/2021, 1:20 PM

## 2021-06-25 NOTE — Progress Notes (Signed)
Progress Note  Patient Name: Asheley Hellberg Date of Encounter: 06/25/2021  [facility] Cardiologist: New  Subjective   Intubated and sedated  Inpatient Medications    Scheduled Meds:  atorvastatin  40 mg Per Tube Daily   chlorhexidine gluconate (MEDLINE KIT)  15 mL Mouth Rinse BID   Chlorhexidine Gluconate Cloth  6 each Topical Daily   cloZAPine  100 mg Per Tube Daily   enoxaparin (LOVENOX) injection  40 mg Subcutaneous Q24H   feeding supplement (PROSource TF)  45 mL Per Tube TID   fiber  1 packet Per Tube BID   fluPHENAZine  2.5 mg Per Tube QHS   insulin aspart  1-3 Units Subcutaneous Q4H   mouth rinse  15 mL Mouth Rinse 10 times per day   pantoprazole sodium  40 mg Per Tube Daily   vitamin B-12  1,000 mcg Per Tube Daily   Continuous Infusions:  feeding supplement (OSMOLITE 1.5 CAL) 55 mL/hr at 06/25/21 0600   nafcillin 12 g in sodium chloride 0.9 % 500 mL continuous infusion 20.8 mL/hr at 06/25/21 0600   PRN Meds: acetaminophen, docusate, fentaNYL (SUBLIMAZE) injection, fentaNYL (SUBLIMAZE) injection, midazolam, polyethylene glycol, white petrolatum   Vital Signs    Vitals:   06/25/21 0500 06/25/21 0600 06/25/21 0800 06/25/21 0817  BP: 116/82 116/71  118/66  Pulse: (!) 101 (!) 102  98  Resp: 17 16    Temp:   98.8 F (37.1 C)   TempSrc:   Axillary   SpO2: 100% 100%    Weight:      Height:        Intake/Output Summary (Last 24 hours) at 06/25/2021 0832 Last data filed at 06/25/2021 0600 Gross per 24 hour  Intake 1220.38 ml  Output 1250 ml  Net -29.62 ml      06/25/2021    2:13 AM 06/24/2021    5:00 AM 06/23/2021    4:39 AM  Last 3 Weights  Weight (lbs) 196 lb 3.4 oz 193 lb 9 oz 201 lb 1 oz  Weight (kg) 89 kg 87.8 kg 91.2 kg      Telemetry    Sinus to sinus tachycardia - Personally Reviewed  Physical Exam   GEN: Intubated and sedated; opens eyes; no purposeful movements Neck: No JVD Cardiac: RRR, 2/6 systolic murmur apex Respiratory: Clear to  auscultation bilaterally. GI: Soft, nontender, non-distended  MS: trace edema; LLE wrapped Neuro:  Not assessed as pt intubated and sedated Psych: Not assessed as pt intubated and sedated  Labs    Chemistry Recent Labs  Lab 06/19/21 1341 06/19/21 1820 06/20/21 1003 06/21/21 0256 06/22/21 0335 06/23/21 0620 06/25/21 0324  NA 139   < >  --  138 145 143 140  K 2.8*   < >  --  4.2 3.5 4.4 4.2  CL 99   < >  --  105 110 111 109  CO2 27   < >  --  _0 21*  GLUCOSE 129*   < >  --  134* 145* 96 103*  BUN 32*   < >  --  34* 28* 21* 21*  CREATININE 1.53*   < >  --  1.12* 0.88 0.88 0.90  CALCIUM 8.9   < >  --  8.1* 8.0* 8.3* 8.1*  MG  --    < >  --  2.5* 1.8 1.8  --   PROT 7.4  --  7.0  --   --   --   --  ALBUMIN 2.1*  --  1.7*  --   --   --   --   AST 98*  --  85*  --   --   --   --   ALT 77*  --  63*  --   --   --   --   ALKPHOS 156*  --  129*  --   --   --   --   BILITOT 1.4*  --  1.6*  --   --   --   --   GFRNONAA 42*   < >  --  >60 >60 >60 >60  ANIONGAP 13   < >  --  _0 < > = values in this interval not displayed.    Lipids  Recent Labs  Lab 06/20/21 0200 06/20/21 0301 06/23/21 0620  CHOL 161  --   --   TRIG 522*   < > 157*  HDL <10*  --   --   LDLCALC UNABLE TO CALCULATE IF TRIGLYCERIDE OVER 400 mg/dL  --   --   CHOLHDL  UNABLE TO CALCULATE  --   --    < > = values in this interval not displayed.    Hematology Recent Labs  Lab 06/23/21 0620 06/24/21 0244 06/25/21 0324  WBC 19.5* 23.2* 21.9*  RBC 3.70* 3.05* 2.65*  HGB 9.8* 8.0* 7.0*  HCT 29.4* 24.7* 21.8*  MCV 79.5* 81.0 82.3  MCH 26.5 26.2 26.4  MCHC 33.3 32.4 32.1  RDW 18.1* 18.6* 18.6*  PLT 179 199 254   Thyroid  Recent Labs  Lab 06/20/21 0015  TSH 0.856     Radiology    ECHO TEE  Result Date: 06/24/2021    TRANSESOPHOGEAL ECHO REPORT   Patient Name:   SHAWNTAE LOWY Date of Exam: 06/24/2021 Medical Rec #:  [id number]      Height:       66.0 in Accession #:    [id number]      Weight:       193.6 lb Date of Birth:  05/01/1975      BSA:          1.972 m Patient Age:    46 years       BP:           115/68 mmHg Patient Gender: F              HR:           102 bpm. Exam Location:  Inpatient Procedure: Transesophageal Echo, 3D Echo, Cardiac Doppler and Color Doppler Indications:     Mitral Valve Vegetation  History:         Patient has prior history of Echocardiogram examinations, most                  recent 06/20/2021. Risk Factors:Dyslipidemia and Hypertension.  Sonographer:     Bernadene Person RDCS Referring Phys:  [id number] Greer Ee [place] Diagnosing Phys: Gwyndolyn Kaufman MD PROCEDURE: After discussion of the risks and benefits of a TEE, an informed consent was obtained. The transesophogeal probe was passed without difficulty through the esophogus of the patient. Sedation performed by performing physician. The patient's vital signs; including heart rate, blood pressure, and oxygen saturation; remained stable throughout the procedure. The patient developed no complications during the procedure. IMPRESSIONS  1. There is a large 1.4x1.3cm highly mobile mass visualized on the posterior mitral valve leaflet consistent with a mitral valve vegetation.  There is an anechoic region surrounding the vegetation with color flow visualized into the area concerning for abscess (clip 35, 36). There is moderate, highly eccentric mitral regurgitation with no flow reversal visualized in the pulmonary veins. Findings consistent with mitral valve endocarditis with abscess formation. . The mitral valve is abnormal. Moderate mitral valve regurgitation.  2. Left ventricular ejection fraction, by estimation, is 60 to 65%. The left ventricle has normal function.  3. Right ventricular systolic function is normal. The right ventricular size is normal.  4. No left atrial/left atrial appendage thrombus was detected.  5. The aortic valve is tricuspid. Aortic valve regurgitation is not visualized. No aortic stenosis is  present. FINDINGS  Left Ventricle: Left ventricular ejection fraction, by estimation, is 60 to 65%. The left ventricle has normal function. The left ventricular internal cavity size was normal in size. Right Ventricle: The right ventricular size is normal. No increase in right ventricular wall thickness. Right ventricular systolic function is normal. Left Atrium: Left atrial size was normal in size. No left atrial/left atrial appendage thrombus was detected. Right Atrium: Right atrial size was normal in size. Pericardium: There is no evidence of pericardial effusion. Mitral Valve: There is a large 1.4x1.3cm highly mobile mass visualized on the posterior mitral valve leaflet consistent with a mitral valve vegetation. There is an anechoic region surrounding the vegetation with color flow visualized into the area concerning for abscess (clip 35, 36). There is moderate, highly eccentric mitral regurgitation with no flow reversal visualized in the pulmonary veins. Findings consistent with mitral valve endocarditis with abscess formation. The mitral valve is abnormal. Moderate mitral valve regurgitation. Tricuspid Valve: The tricuspid valve is normal in structure. Tricuspid valve regurgitation is trivial. Aortic Valve: The aortic valve is tricuspid. Aortic valve regurgitation is not visualized. No aortic stenosis is present. Pulmonic Valve: The pulmonic valve was normal in structure. Pulmonic valve regurgitation is trivial. Aorta: The aortic root is normal in size and structure. IAS/Shunts: The atrial septum is grossly normal.  MR PISA:        1.57 cm MR PISA Radius: 0.50 cm Gwyndolyn Kaufman MD Electronically signed by Gwyndolyn Kaufman MD Signature Date/Time: 06/24/2021/3:55:22 PM    Final    DG CHEST PORT 1 VIEW  Result Date: 06/24/2021 CLINICAL DATA:  Sepsis. Acute respiratory failure. Endotracheally intubated. EXAM: PORTABLE CHEST 1 VIEW COMPARISON:  06/19/2021 FINDINGS: Endotracheal tube and nasogastric tube  remain in appropriate position. Increased subsegmental atelectasis is seen in both mid lung zones. No evidence of pulmonary consolidation or pleural effusion. IMPRESSION: Increased bilateral subsegmental atelectasis. Electronically Signed   By: Marlaine Hind M.D.   On: 06/24/2021 08:17     Patient Profile     46 y.o. female with PMH hypertension, hyperlipidemia, schizophrenia admitted with septic knee, mitral valve endocarditis and CVA.  Assessment & Plan    Mitral valve MSSA endocarditis-I have personally reviewed the patient's transesophageal echocardiogram.  She has a vegetation on her mitral valve with abscess formation.  MR is difficult to quantitate but appears moderate.  Continue antibiotics.  She has been seen by cardiothoracic surgery.  However given septic knee, recent CVA and other comorbidities not clear that she is a surgical candidate at present.  Dr. Roxan Hockey to follow-up.   Status post CVA-embolic from SBE. Septic knee/abscess-status post knee arthroscopy/debridement/synovectomy.  Orthopedics is following.   Acute respiratory failure requiring mechanical ventilation-Per CCM. May need trach. Schizophrenia History of hypertension-patient's blood pressure is controlled.  For questions or updates, please contact [facility] Please consult [email or web address] for contact info under        Signed, [name], MD  06/25/2021, 8:32 AM

## 2021-06-25 NOTE — Procedures (Signed)
Cortrak  Tube Type:  Cortrak - 43 inches Tube Location:  Left nare Initial Placement:  Stomach Secured by: Bridle Technique Used to Measure Tube Placement:  Marking at nare/corner of mouth Cortrak Secured At:  70 cm  Cortrak Tube Team Note:  Consult received to place a Cortrak feeding tube.   X-ray is required, abdominal x-ray has been ordered by the Cortrak team. Please confirm tube placement before using the Cortrak tube.   If the tube becomes dislodged please keep the tube and contact the Cortrak team at [email or web address] (password [email or web address]) for replacement.  If after hours and replacement cannot be delayed, place a NG tube and confirm placement with an abdominal x-ray.    Ivi Griffith MS, RD, LDN Please refer to AMION for RD and/or RD on-call/weekend/after hours pager   

## 2021-06-25 NOTE — Progress Notes (Signed)
Still no cuff leak after high dose steroids. Given already high risk of failed extubation, will give steroids overnight and plan to extubate in the morning.  Julian Hy, DO 06/25/21 4:06 PM [facility]

## 2021-06-25 NOTE — Progress Notes (Signed)
NAME:  Debra Klein, MRN:  [id number], DOB:  1975/04/10, LOS: 6 ADMISSION DATE:  06/19/2021, CONSULTATION DATE:  06/19/21 REFERRING MD:  Laurena Spies, CHIEF COMPLAINT:  respiratory failure   History of Present Illness:  Ms. Chowning is a 46 year old woman with a history of schizophrenia who presented to the hospital today with acute encephalopathy. History obtained through chart review:   Family reports that about 3 weeks ago the patient started to be less verbal, but was still managing her medications. They were noticing for the last few days they would find a pill or 2 dropped on the floor although she was still overall taking her medications.  Her verbal output was actually improving in the last week or so, but then acutely today she was completely nonverbal and so they came to the ED for further evaluation.  They report she did not have any other complaints and did not notice any fevers or any physical issues other than left leg swelling and pain   Per chart review, she stopped taking her medications approximately 1 week ago, had a fall on 06/15/2021 with left ankle swelling, became nonverbal on 6/6, and subsequently had another fall on 6/7 found to have a left lower leg fracture and discharged with a cam walker boot after radiographs of her left ankle and tibia/fibula revealed previous intramedullary rodding and rod removal of the left tibia without clear evidence of acute fracture.  Subsequently she fell again on 6/8 and was found to be febrile (101.2), tachycardic to 110s, noted to be nonverbal, not following commands, frequently dozing off but withdrawing in all 4 extremities without a facial droop.  She was started on vancomycin, ceftriaxone, acyclovir for meningitis coverage.  Additionally, per hospitalist admission note LP was attempted but unsuccessful at bedside.  Subsequently at approximately 9 PM the patient had a change in mental status (drooling) with gaze deviation to the left and was  intubated. PCCM consulted. Head CT demonstrating an insular hypodensity concerning for MCA infarct.   Pertinent  Medical History  Schizophrenia HTN anemia  Significant Hospital Events: Including procedures, antibiotic start and stop dates in addition to other pertinent events   6/7: Fall --> fibula fracture, Minimally verbal at an outpatient visit with [facility]  6/8: Presented to the ED nonverbal with altered mentation. Progressively worsening mentation with requirement of intubation for airway protection.  Empirically treated for sepsis, presumed meningitis. MRI with large MCA infarct and punctate right hemisphere infarcts.  6/9: Blood cultures obtained on arrival positive for MSSA  6/10 MRI knee demonstrates septic arthritis.   Interim History / Subjective:  Overnight she had a BM. Off sedation this morning, still not regularly following commands.  Objective   Blood pressure 116/71, pulse (!) 102, temperature (!) 100.8 F (38.2 C), temperature source Axillary, resp. rate 16, height 5\' 6"  (1.676 m), weight 89 kg, SpO2 100 %.    Vent Mode: PRVC FiO2 (%):  [30 %] 30 % Set Rate:  [14 bmp-17 bmp] 17 bmp Vt Set:  [470 mL] 470 mL PEEP:  [5 cmH20] 5 cmH20 Pressure Support:  [8 cmH20] 8 cmH20 Plateau Pressure:  [13 cmH20-15 cmH20] 13 cmH20   Intake/Output Summary (Last 24 hours) at 06/25/2021 0701 Last data filed at 06/25/2021 0600 Gross per 24 hour  Intake 1220.38 ml  Output 1250 ml  Net -29.62 ml    Filed Weights   06/23/21 0439 06/24/21 0500 06/25/21 0213  Weight: 91.2 kg 87.8 kg 89 kg   Examination: General: critically ill appearing  woman lying in bed in NAD HENT: [place]/AT, eyes anicteric, oral mucosa moist, ETT in place. Lungs: rhonchi R>L cleared with suctioning. Synchronous with MV. Cardiovascular: S1S2, less audible murmur today. RRR Abdomen: soft, NT Extremities: LL>R LE edema, no cyanosis. Left knee bandaged. Neuro:  sleeping but aroused to oral suctioning, strong  cough, had a gag during oral suctioning today. Not following any commands today. Sluggish response to trapezius squeeze, still not moving much. Derm: warm, dry, no diffuse rashes  WBC 21.9 H/H 87.0/21.8 BG 90-120s Blood cultures 6/10> NGTD Synovial fluid> staph aureus Resp culture> rare PMNs, no organisms seen  TEE: vegetations on MV with abscess  Assessment & Plan:  Acute respiratory failure with hypoxia requiring mechanical ventilation -LTVV -PAD protocol; hasn't been needing sedation -VAP prevention protocol -daily SAT & SBT -have previously discussed with family that I worry she will not control her upper airway secretions and may require reintubation, which would be an indication for tracheostomy for her as this is unlikely to resolve in the next week. Family interested in trach to facilitate maximal recovery if needed.  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 -con't nafcillin -PT, OT, SLP - no longer requires aspirin for secondary prevention; on statin for hyperlipidemia  Sepsis due to MSSA bacteremia, L knee septic arthritis, native MV endocarditis Presume persistent fevers are attributable to endocarditis -appreciate management from Orthopedic surgery -awaiting final recommendations from TCTS -appreciate ID's recommendations  AKI due to sepsis from Staph aureus bacteremia, resolved -BMP -strict I/Os -renally dose meds  Schizophrenia, prominent negative symptoms versus stroke symptoms -Con't PTA cloazapine and fluphenazine  Acute metabolic encephalopathy due to sepsis and stroke, may also have prominent negative symptoms from schizophrenia -goal RASS 0; sedation only as required to tolerate MV - Con't clozapine and fluphenazine for schizophrenia --neuroprotective measures, aggressively treat fevers  Hypergylcemia -SSI PRN -goal BG 140-180  At risk for  malnutrition -cortrak today -con't TF via OGT  Best Practice (right click and "Reselect all SmartList Selections" daily)   Diet/type: tubefeeds DVT prophylaxis: LMWH GI prophylaxis: PPI Lines: N/A Foley:  N/A Code Status:  full code Last date of multidisciplinary goals of care discussion [Mother and sister updated at bedside on 6/9]  This patient is critically ill with multiple organ system failure which requires frequent high complexity decision making, assessment, support, evaluation, and titration of therapies. This was completed through the application of advanced monitoring technologies and extensive interpretation of multiple databases. During this encounter critical care time was devoted to patient care services described in this note for 44  minutes.  Julian Hy, DO 06/25/21 7:51 AM [facility]

## 2021-06-25 NOTE — Progress Notes (Signed)
1 Day Post-Op Procedure(s) (LRB): ARTHROSCOPY LEFT KNEE IRRIGRATION AND DEBRIEDMENT AND SYNOVECTOMY (Left) Subjective: Awake, sometimes tracking but not consistently. She is not folowing commands at present.   Objective: Vital signs in last 24 hours: Temp:  [98.4 F (36.9 C)-100.8 F (38.2 C)] 98.4 F (36.9 C) (06/14 1600) Pulse Rate:  [85-123] 107 (06/14 1600) Cardiac Rhythm: Sinus tachycardia (06/14 0800) Resp:  [14-24] 19 (06/14 1600) BP: (105-152)/(66-113) 122/68 (06/14 1600) SpO2:  [98 %-100 %] 100 % (06/14 1600) FiO2 (%):  [30 %] 30 % (06/14 1520) Weight:  [89 kg] 89 kg (06/14 0213)  Hemodynamic parameters for last 24 hours:    Intake/Output from previous day: 06/13 0701 - 06/14 0700 In: 1220.4 [I.V.:300; NG/GT:430; IV Piggyback:490.4] Out: 1250 [Urine:1250] Intake/Output this shift: Total I/O In: 338 [NG/GT:130; IV Piggyback:208] Out: 300 [Urine:300]  General appearance: awake Neurologic: right hemiparesis, not moving left leg, not following commands Heart: regular rate and rhythm and systolic murmur Lungs: coarse BS  Lab Results: Recent Labs    06/24/21 0244 06/25/21 0324  WBC 23.2* 21.9*  HGB 8.0* 7.0*  HCT 24.7* 21.8*  PLT 199 254   BMET:  Recent Labs    06/23/21 0620 06/25/21 0324  NA 143 140  K 4.4 4.2  CL 111 109  CO2 23 21*  GLUCOSE 96 103*  BUN 21* 21*  CREATININE 0.88 0.90  CALCIUM 8.3* 8.1*    PT/INR: No results for input(s): "LABPROT", "INR" in the last 72 hours. ABG    Component Value Date/Time   PHART 7.48 (H) 06/19/2021 2241   HCO3 25.7 06/19/2021 2241   O2SAT 99.9 06/19/2021 2241   CBG (last 3)  Recent Labs    06/25/21 0749 06/25/21 1123 06/25/21 1524  GLUCAP 116* 120* 122*    Assessment/Plan: S/P Procedure(s) (LRB): ARTHROSCOPY LEFT KNEE IRRIGRATION AND DEBRIEDMENT AND SYNOVECTOMY (Left) Mitral endocarditis complicated by left MCA stroke. Reviewed TEE films. Large 1.3 cm relatively mobile vegetation with moderate  MR. Under normal circumstances would be an indication for surgery even with a small CVA. However, given her current mental status not being able to follow even simple commands, surgery would not be appropriate. May consider repeat brain imaging to see if there have been additional embolic events.  Lurline Idol would preclude sternotomy and pretty much eliminate the option of surgery here at [facility].   LOS: 6 days    Melrose Nakayama 06/25/2021

## 2021-06-25 NOTE — Progress Notes (Signed)
STROKE TEAM PROGRESS NOTE   INTERVAL HISTORY No family at bedside, patient still intubated on vent, not follow commands.  Moving all extremities.  TEE yesterday showed large MV vegetation.  DC aspirin and Lipitor for now.  Vitals:   06/25/21 0900 06/25/21 1000 06/25/21 1100 06/25/21 1200  BP: 117/66 119/67 (!) 132/113   Pulse: (!) 102  (!) 123   Resp: 19 17 (!) 24   Temp:    98.8 F (37.1 C)  TempSrc:    Oral  SpO2: 100%  99%   Weight:      Height:       CBC:  Recent Labs  Lab 06/24/21 0244 06/25/21 0324  WBC 23.2* 21.9*  NEUTROABS 18.8* 19.5*  HGB 8.0* 7.0*  HCT 24.7* 21.8*  MCV 81.0 82.3  PLT 199 409   Basic Metabolic Panel:  Recent Labs  Lab 06/22/21 0335 06/23/21 0620 06/25/21 0324  NA 145 143 140  K 3.5 4.4 4.2  CL 110 111 109  CO2 25 23 21*  GLUCOSE 145* 96 103*  BUN 28* 21* 21*  CREATININE 0.88 0.88 0.90  CALCIUM 8.0* 8.3* 8.1*  MG 1.8 1.8  --   PHOS 2.9 3.5  --    Lipid Panel:  Recent Labs  Lab 06/20/21 0200 06/20/21 0301 06/23/21 0620  CHOL 161  --   --   TRIG 522*   < > 157*  HDL <10*  --   --   CHOLHDL  UNABLE TO CALCULATE  --   --   VLDL UNABLE TO CALCULATE  --   --   LDLCALC UNABLE TO CALCULATE IF TRIGLYCERIDE OVER 400 mg/dL  --   --    < > = values in this interval not displayed.   HgbA1c:  Recent Labs  Lab 06/20/21 0015  HGBA1C 6.5*   Urine Drug Screen:  Recent Labs  Lab 06/19/21 1330  LABOPIA NONE DETECTED  COCAINSCRNUR NONE DETECTED  LABBENZ NONE DETECTED  AMPHETMU NONE DETECTED  THCU NONE DETECTED  LABBARB NONE DETECTED    Alcohol Level  Recent Labs  Lab 06/19/21 1341  ETH <10    IMAGING past 24 hours DG Abd Portable 1V  Result Date: 06/25/2021 CLINICAL DATA:  Feeding tube placement EXAM: PORTABLE ABDOMEN - 1 VIEW COMPARISON:  06/19/2021 FINDINGS: Single supine view of the abdomen and pelvis with the far left abdomen excluded. Feeding tube terminates at the distal stomach. Nasogastric tube is presumably been  removed. Non-obstructive bowel gas pattern. No gross free intraperitoneal air. IMPRESSION: Feeding tube terminating at the distal stomach. Electronically Signed   By: Abigail Miyamoto M.D.   On: 06/25/2021 10:17   ECHO TEE  Result Date: 06/24/2021    TRANSESOPHOGEAL ECHO REPORT   Patient Name:   Debra Klein Date of Exam: 06/24/2021 Medical Rec #:  [id number]      Height:       66.0 in Accession #:    [id number]     Weight:       193.6 lb Date of Birth:  1975/06/05      BSA:          1.972 m Patient Age:    46 years       BP:           115/68 mmHg Patient Gender: F              HR:           102 bpm. Exam Location:  Inpatient Procedure: Transesophageal Echo, 3D Echo, Cardiac Doppler and Color Doppler Indications:     Mitral Valve Vegetation  History:         Patient has prior history of Echocardiogram examinations, most                  recent 06/20/2021. Risk Factors:Dyslipidemia and Hypertension.  Sonographer:     Bernadene Person RDCS Referring Phys:  [id number] Greer Ee [place] Diagnosing Phys: Gwyndolyn Kaufman MD PROCEDURE: After discussion of the risks and benefits of a TEE, an informed consent was obtained. The transesophogeal probe was passed without difficulty through the esophogus of the patient. Sedation performed by performing physician. The patient's vital signs; including heart rate, blood pressure, and oxygen saturation; remained stable throughout the procedure. The patient developed no complications during the procedure. IMPRESSIONS  1. There is a large 1.4x1.3cm highly mobile mass visualized on the posterior mitral valve leaflet consistent with a mitral valve vegetation. There is an anechoic region surrounding the vegetation with color flow visualized into the area concerning for abscess (clip 35, 36). There is moderate, highly eccentric mitral regurgitation with no flow reversal visualized in the pulmonary veins. Findings consistent with mitral valve endocarditis with abscess formation. . The mitral  valve is abnormal. Moderate mitral valve regurgitation.  2. Left ventricular ejection fraction, by estimation, is 60 to 65%. The left ventricle has normal function.  3. Right ventricular systolic function is normal. The right ventricular size is normal.  4. No left atrial/left atrial appendage thrombus was detected.  5. The aortic valve is tricuspid. Aortic valve regurgitation is not visualized. No aortic stenosis is present. FINDINGS  Left Ventricle: Left ventricular ejection fraction, by estimation, is 60 to 65%. The left ventricle has normal function. The left ventricular internal cavity size was normal in size. Right Ventricle: The right ventricular size is normal. No increase in right ventricular wall thickness. Right ventricular systolic function is normal. Left Atrium: Left atrial size was normal in size. No left atrial/left atrial appendage thrombus was detected. Right Atrium: Right atrial size was normal in size. Pericardium: There is no evidence of pericardial effusion. Mitral Valve: There is a large 1.4x1.3cm highly mobile mass visualized on the posterior mitral valve leaflet consistent with a mitral valve vegetation. There is an anechoic region surrounding the vegetation with color flow visualized into the area concerning for abscess (clip 35, 36). There is moderate, highly eccentric mitral regurgitation with no flow reversal visualized in the pulmonary veins. Findings consistent with mitral valve endocarditis with abscess formation. The mitral valve is abnormal. Moderate mitral valve regurgitation. Tricuspid Valve: The tricuspid valve is normal in structure. Tricuspid valve regurgitation is trivial. Aortic Valve: The aortic valve is tricuspid. Aortic valve regurgitation is not visualized. No aortic stenosis is present. Pulmonic Valve: The pulmonic valve was normal in structure. Pulmonic valve regurgitation is trivial. Aorta: The aortic root is normal in size and structure. IAS/Shunts: The atrial septum  is grossly normal.  MR PISA:        1.57 cm MR PISA Radius: 0.50 cm Gwyndolyn Kaufman MD Electronically signed by Gwyndolyn Kaufman MD Signature Date/Time: 06/24/2021/3:55:22 PM    Final     PHYSICAL EXAM  Temp:  [98.7 F (37.1 C)-100.8 F (38.2 C)] 98.8 F (37.1 C) (06/14 1200) Pulse Rate:  [79-123] 123 (06/14 1100) Resp:  [14-24] 24 (06/14 1100) BP: (109-152)/(66-113) 132/113 (06/14 1100) SpO2:  [98 %-100 %] 99 % (06/14 1100) FiO2 (%):  [30 %] 30 % (06/14 0817)  Weight:  [89 kg] 89 kg (06/14 0213)  General - Well nourished, well developed, intubated off sedation.  Ophthalmologic - fundi not visualized due to noncooperation.  Cardiovascular - Regular rate and rhythm.  Neuro - intubated not on sedation, eyes closed but halfway open on voice and pain summation, not following commands. With forced eye opening, eyes in need position, not blinking to visual threat, doll's eyes present, not tracking, PERRL. Corneal reflex present bilaterally, gag and cough present. Breathing over the vent.  Facial symmetry not able to test due to ET tube.  Tongue protrusion not cooperative. On pain stimulation, no movement in all extremities. DTR diminished and no babinski. Sensation, coordination and gait not tested.   ASSESSMENT/PLAN Debra Klein is a 46 y.o. female with history of schizophrenia, HTN and HLD presenting with AMS, fever and acute onset left gaze deviation.  She may have had seizure activity at this time, cEEG ongoing.  She was intubated for airway protection.  CT head was concerning for left sided stroke, and CTA revealed left MCA M2 branch occlusion.  MRI brain reveals large left MCA territory infarct and scattered acute infarcts in right hemisphere and left occipital lobe.  Stroke: Left MCA infarct and small scattered acute infarcts likely secondary due to embolism from bacterial endocarditis CT head Loss of gray-white matter differentiation along left insula CTA head & neck occlusion  of left MCA M2 branch in insula.  No other intracranial arterial occlusion or high grade stenosis MRI  large left MCA territory cortical infarct, scattered punctate foci of acute ischemia in right hemisphere and left occipital lobe 2D Echo EF 50%.  Small vegetation noted on the mitral valve.  TEE showed large mobile MV vegetation LDL direct 27.7 HgbA1c 6.5 UDS negative VTE prophylaxis - lovenox  No antithrombotic prior to admission, now on aspirin 81 mg daily. Given the stroke cause likely due to endocarditis, will DC aspirin 81. Therapy recommendations: CLR  disposition:  pending  Possible seizures Patient had possible seizure on 6/8 with new onset left gaze deviation and altered mental status EEG and long-term EEG moderate to severe encephalopathy, no seizure EEG d/c'd  Endocarditis Blood culture MSSA Sepsis bacteremia on nafcillin  2D Echo Small vegetation noted on the mitral valve.  TEE showed large mobile MV vegetation  Left knee septic joint Status post left knee washout Surgery on board  Acute Hypoxic Respiratory Failure Patient was intubated for airway protection Ventilator management per CCM  Hypertension Home meds:  none Stable  Hyperlipidemia Home meds:  atorvastatin 20 mg daily LDL 27.7, goal < 70 DC Lipitor given low LDL  Schizophrenia Patient has history of schizophrenia Had stopped taking medications about a week prior to admission and had become nonverbal May consider to consult psychiatry when patient extubated  Other Stroke Risk Factors   Other [facility] day # 6  This patient is critically ill due to respiratory distress, endocarditis, stroke  and at significant risk of neurological worsening, death form heart failure, respiratory failure, recurrent stroke, bleeding from [facility], seizure, sepsis. This patient's care requires constant monitoring of vital signs, hemodynamics, respiratory and cardiac monitoring, review of multiple  databases, neurological assessment, discussion with family, other specialists and medical decision making of high complexity. I spent 35 minutes of neurocritical care time in the care of this patient.    Rosalin Hawking, MD PhD Stroke Neurology 06/25/2021 8:40 PM    To contact Stroke Continuity provider, please refer to [email or web address] After hours, contact  General Neurology

## 2021-06-25 NOTE — Progress Notes (Signed)
Occupational Therapy Treatment Patient Details Name: Debra Klein MRN: [id number] DOB: March 03, 1975 Today's Date: 06/25/2021   History of present illness 46 y.o. female presents to [facility] hospital 06/19/2021 with AMS, poor PO intake and recent falls, along with recentL ankle fx. Pt admitted with severes sepsis of unknown source. MRI 6/8 shows large L MCA infarct. Pt required intubation 6/8. PMH includes schizophrenia, HTN, anemia.   OT comments  Pt seems more lethargic this session than previous OT session. Pt is not following commands but does arousal with position change. Pt's mother present and no response to mother with her cues. Pt does not blink to threat with R eye but does with L eye. Pt using L UE during transfer but less movement with R compared to previous sessions. Recommendation remains CIR pending increased progress.  Pt currently weaning on vent CPAP 30% and peep5   Recommendations for follow up therapy are one component of a multi-disciplinary discharge planning process, led by the attending physician.  Recommendations may be updated based on patient status, additional functional criteria and insurance authorization.    Follow Up Recommendations  Acute inpatient rehab (3hours/day)    Assistance Recommended at Discharge Frequent or constant Supervision/Assistance  Patient can return home with the following  Two people to help with walking and/or transfers;Two people to help with bathing/dressing/bathroom   Equipment Recommendations  Other (comment)    Recommendations for Other Services Rehab consult    Precautions / Restrictions Precautions Precautions: Fall Precaution Comments: intubated, rectal pouch, purewick, NG tube, feeding tube oral with NG tube, bil mittens, Required Braces or Orthoses: Splint/Cast Splint/Cast: L cam boot Splint/Cast - Date Prophylactic Dressing Applied (if applicable): 95/18/84 Restrictions Weight Bearing Restrictions: Yes LLE Weight Bearing:  Weight bearing as tolerated       Mobility Bed Mobility Overal bed mobility: Needs Assistance Bed Mobility: Rolling Rolling: Max assist, +2 for physical assistance         General bed mobility comments: bed placed in chair position for increased stimulation of pt and rolled in bed. Pt opened eyes with position change.    Transfers                   General transfer comment: too lethargic     Balance Overall balance assessment: Needs assistance Sitting-balance support: Single extremity supported, Feet unsupported Sitting balance-Leahy Scale: Zero Sitting balance - Comments: full support needed. Tried to facilitate sitting away from the back of the bed but pt not using LUE effectively enough to do this                                   ADL either performed or assessed with clinical judgement   ADL Overall ADL's : Needs assistance/impaired Eating/Feeding: NPO                                     General ADL Comments: total (A) for all aspects. pt incontinence of stool on arrival and required increase time for peri hyigene. pt showing no awareness. pt not responding to mother in the room calling name or touching her    Extremity/Trunk Assessment Upper Extremity Assessment Upper Extremity Assessment: RUE deficits/detail;LUE deficits/detail RUE Deficits / Details: pt moved R UE to change of position and with L LE discomfort. pt does not follow a command or sponateously move at this  time LUE Deficits / Details: pt holding herself up with L UE , tone need. pt moving entire trunk with shoulder flexion but not grimace noted   Lower Extremity Assessment Lower Extremity Assessment: Defer to PT evaluation        Vision       Perception     Praxis      Cognition Arousal/Alertness: Lethargic Behavior During Therapy: Flat affect Overall Cognitive Status: Difficult to assess Area of Impairment: Attention, Following commands                    Current Attention Level: Focused           General Comments: pt following no commands at this time, did not attend to stimuli even with max cues, no visual tracking noted        Exercises Other Exercises Other Exercises: PROM cervical rotation, esp to stretch R neck as pt maintaining head twd vent Other Exercises: grimacing with mvmt of L knee so motion minimized    Shoulder Instructions       General Comments high risk for skin break down if pt continues to have diarrhea. pt noted to have some skin color changes at thighs due to moisture and RN present during verbalized concerns. Question if pt could benefit from antifuncal powder with miconatzole nitrate or wicking barrier to prevent skin break down    Pertinent Vitals/ Pain       Pain Assessment Pain Assessment: Faces Faces Pain Scale: Hurts a little bit Pain Location: LLE with any movement Pain Descriptors / Indicators: Grimacing Pain Intervention(s): Monitored during session, Repositioned  Home Living                                          Prior Functioning/Environment              Frequency  Min 2X/week        Progress Toward Goals  OT Goals(current goals can now be found in the care plan section)  Progress towards OT goals: Not progressing toward goals - comment  Acute Rehab OT Goals Patient Stated Goal: intubated. mother present and reports she wants her to go to rehab here. OT Goal Formulation: Patient unable to participate in goal setting Time For Goal Achievement: 07/06/21 Potential to Achieve Goals: Good ADL Goals Pt Will Perform Grooming: with mod assist;sitting Pt Will Transfer to Toilet: with +2 assist;with mod assist;bedside commode;stand pivot transfer Additional ADL Goal #1: pt will follow 2 step command 50% of attempts during session Additional ADL Goal #2: pt will complete bed mobility total +2 Min (A) as precursor to adls.  Plan Discharge plan remains  appropriate    Co-evaluation    PT/OT/SLP Co-Evaluation/Treatment: Yes Reason for Co-Treatment: Complexity of the patient's impairments (multi-system involvement);Necessary to address cognition/behavior during functional activity;For patient/therapist safety;To address functional/ADL transfers   OT goals addressed during session: ADL's and self-care;Proper use of Adaptive equipment and DME      AM-PAC OT "6 Clicks" Daily Activity     Outcome Measure   Help from another person eating meals?: Total Help from another person taking care of personal grooming?: Total Help from another person toileting, which includes using toliet, bedpan, or urinal?: Total Help from another person bathing (including washing, rinsing, drying)?: Total Help from another person to put on and taking off regular upper body clothing?: Total Help  from another person to put on and taking off regular lower body clothing?: Total 6 Click Score: 6    End of Session Equipment Utilized During Treatment: Oxygen  OT Visit Diagnosis: Unsteadiness on feet (R26.81);Muscle weakness (generalized) (M62.81)   Activity Tolerance Patient tolerated treatment well   Patient Left in bed;with call bell/phone within reach;with bed alarm set;with nursing/sitter in room;with SCD's reapplied;with restraints reapplied   Nurse Communication Mobility status;Precautions        Time: 6484-7207 OT Time Calculation (min): 31 min  Charges: OT General Charges $OT Visit: 1 Visit OT Treatments $Self Care/Home Management : 8-22 mins   Brynn, OTR/L  Acute Rehabilitation Services Office: [phone] .   Jeri Modena 06/25/2021, 1:26 PM

## 2021-06-26 ENCOUNTER — Inpatient Hospital Stay (HOSPITAL_COMMUNITY): Payer: Medicare Other

## 2021-06-26 DIAGNOSIS — G9340 Encephalopathy, unspecified: Secondary | ICD-10-CM | POA: Diagnosis not present

## 2021-06-26 DIAGNOSIS — Z01810 Encounter for preprocedural cardiovascular examination: Secondary | ICD-10-CM

## 2021-06-26 DIAGNOSIS — Z9911 Dependence on respirator [ventilator] status: Secondary | ICD-10-CM | POA: Diagnosis not present

## 2021-06-26 DIAGNOSIS — I340 Nonrheumatic mitral (valve) insufficiency: Secondary | ICD-10-CM | POA: Diagnosis not present

## 2021-06-26 DIAGNOSIS — D649 Anemia, unspecified: Secondary | ICD-10-CM

## 2021-06-26 DIAGNOSIS — I63412 Cerebral infarction due to embolism of left middle cerebral artery: Secondary | ICD-10-CM | POA: Diagnosis not present

## 2021-06-26 DIAGNOSIS — I339 Acute and subacute endocarditis, unspecified: Secondary | ICD-10-CM | POA: Diagnosis not present

## 2021-06-26 DIAGNOSIS — N179 Acute kidney failure, unspecified: Secondary | ICD-10-CM | POA: Diagnosis not present

## 2021-06-26 DIAGNOSIS — R601 Generalized edema: Secondary | ICD-10-CM

## 2021-06-26 DIAGNOSIS — E876 Hypokalemia: Secondary | ICD-10-CM

## 2021-06-26 DIAGNOSIS — J9601 Acute respiratory failure with hypoxia: Secondary | ICD-10-CM | POA: Diagnosis not present

## 2021-06-26 DIAGNOSIS — A4101 Sepsis due to Methicillin susceptible Staphylococcus aureus: Secondary | ICD-10-CM | POA: Diagnosis not present

## 2021-06-26 LAB — BASIC METABOLIC PANEL
Anion gap: 7 (ref 5–15)
BUN: 17 mg/dL (ref 6–20)
CO2: 23 mmol/L (ref 22–32)
Calcium: 7.8 mg/dL — ABNORMAL LOW (ref 8.9–10.3)
Chloride: 108 mmol/L (ref 98–111)
Creatinine, Ser: 0.76 mg/dL (ref 0.44–1.00)
GFR, Estimated: >60 mL/min (ref >60)
Glucose, Bld: 145 mg/dL — ABNORMAL HIGH (ref 70–99)
Potassium: 3.4 mmol/L — ABNORMAL LOW (ref 3.5–5.1)
Sodium: 138 mmol/L (ref 135–145)

## 2021-06-26 LAB — GLUCOSE, CAPILLARY
Glucose-Capillary: 116 mg/dL — ABNORMAL HIGH (ref 70–99)
Glucose-Capillary: 116 mg/dL — ABNORMAL HIGH (ref 70–99)
Glucose-Capillary: 140 mg/dL — ABNORMAL HIGH (ref 70–99)
Glucose-Capillary: 141 mg/dL — ABNORMAL HIGH (ref 70–99)
Glucose-Capillary: 143 mg/dL — ABNORMAL HIGH (ref 70–99)
Glucose-Capillary: 144 mg/dL — ABNORMAL HIGH (ref 70–99)
Glucose-Capillary: 149 mg/dL — ABNORMAL HIGH (ref 70–99)
Glucose-Capillary: 163 mg/dL — ABNORMAL HIGH (ref 70–99)

## 2021-06-26 LAB — CBC
HCT: 19.5 % — ABNORMAL LOW (ref 36.0–46.0)
Hemoglobin: 6.3 g/dL — CL (ref 12.0–15.0)
MCH: 26.4 pg (ref 26.0–34.0)
MCHC: 32.3 g/dL (ref 30.0–36.0)
MCV: 81.6 fL (ref 80.0–100.0)
Platelets: 273 10*3/uL (ref 150–400)
RBC: 2.39 MIL/uL — ABNORMAL LOW (ref 3.87–5.11)
RDW: 18.5 % — ABNORMAL HIGH (ref 11.5–15.5)
WBC: 23.6 10*3/uL — ABNORMAL HIGH (ref 4.0–10.5)
nRBC: 0.1 % (ref 0.0–0.2)

## 2021-06-26 LAB — CULTURE, RESPIRATORY W GRAM STAIN

## 2021-06-26 LAB — PREPARE RBC (CROSSMATCH)

## 2021-06-26 LAB — HEMOGLOBIN AND HEMATOCRIT, BLOOD
HCT: 25.1 % — ABNORMAL LOW (ref 36.0–46.0)
Hemoglobin: 8.1 g/dL — ABNORMAL LOW (ref 12.0–15.0)

## 2021-06-26 LAB — CULTURE, BLOOD (ROUTINE X 2)
Culture: NO GROWTH
Culture: NO GROWTH
Special Requests: ADEQUATE
Special Requests: ADEQUATE

## 2021-06-26 MED ORDER — POTASSIUM CHLORIDE 20 MEQ PO PACK
40.0000 meq | PACK | Freq: Once | ORAL | Status: AC
  Administered 2021-06-26: 40 meq
  Filled 2021-06-26: qty 2

## 2021-06-26 MED ORDER — ORAL CARE MOUTH RINSE
15.0000 mL | OROMUCOSAL | Status: DC
  Administered 2021-06-26: 15 mL via OROMUCOSAL

## 2021-06-26 MED ORDER — DEXMEDETOMIDINE HCL IN NACL 400 MCG/100ML IV SOLN
0.1000 ug/kg/h | INTRAVENOUS | Status: AC
  Administered 2021-06-27: .7 ug/kg/h via INTRAVENOUS
  Filled 2021-06-26: qty 100

## 2021-06-26 MED ORDER — TRANEXAMIC ACID 1000 MG/10ML IV SOLN
1.5000 mg/kg/h | INTRAVENOUS | Status: AC
  Administered 2021-06-27: 1.5 mg/kg/h via INTRAVENOUS
  Filled 2021-06-26: qty 25

## 2021-06-26 MED ORDER — ORAL CARE MOUTH RINSE: 15.0000 mL | OROMUCOSAL | Status: DC | PRN

## 2021-06-26 MED ORDER — MAGNESIUM SULFATE 2 GM/50ML IV SOLN
2.0000 g | Freq: Once | INTRAVENOUS | Status: AC
  Administered 2021-06-26: 2 g via INTRAVENOUS
  Filled 2021-06-26: qty 50

## 2021-06-26 MED ORDER — FUROSEMIDE 10 MG/ML IJ SOLN
40.0000 mg | Freq: Once | INTRAMUSCULAR | Status: AC
  Administered 2021-06-26: 40 mg via INTRAVENOUS
  Filled 2021-06-26: qty 4

## 2021-06-26 MED ORDER — TRANEXAMIC ACID (OHS) PUMP PRIME SOLUTION
2.0000 mg/kg | INTRAVENOUS | Status: DC
  Filled 2021-06-26: qty 1.8

## 2021-06-26 MED ORDER — HEPARIN 30,000 UNITS/1000 ML (OHS) CELLSAVER SOLUTION
Status: DC
  Filled 2021-06-26: qty 1000

## 2021-06-26 MED ORDER — POTASSIUM CHLORIDE 2 MEQ/ML IV SOLN
80.0000 meq | INTRAVENOUS | Status: DC
  Filled 2021-06-26: qty 40

## 2021-06-26 MED ORDER — SODIUM CHLORIDE 0.9% IV SOLUTION: Freq: Once | INTRAVENOUS | Status: AC

## 2021-06-26 MED ORDER — TRANEXAMIC ACID (OHS) BOLUS VIA INFUSION
15.0000 mg/kg | INTRAVENOUS | Status: AC
  Administered 2021-06-27: 1353 mg via INTRAVENOUS
  Filled 2021-06-26: qty 1353

## 2021-06-26 MED ORDER — PLASMA-LYTE A IV SOLN
INTRAVENOUS | Status: DC
  Filled 2021-06-26: qty 2.5

## 2021-06-26 MED ORDER — MUPIROCIN 2 % EX OINT
1.0000 | TOPICAL_OINTMENT | Freq: Two times a day (BID) | CUTANEOUS | Status: DC
  Administered 2021-06-26: 1 via NASAL

## 2021-06-26 MED ORDER — PHENYLEPHRINE HCL-NACL 20-0.9 MG/250ML-% IV SOLN
30.0000 ug/min | INTRAVENOUS | Status: AC
  Administered 2021-06-27: 20 ug/min via INTRAVENOUS
  Filled 2021-06-26: qty 250

## 2021-06-26 MED ORDER — INSULIN REGULAR(HUMAN) IN NACL 100-0.9 UT/100ML-% IV SOLN
INTRAVENOUS | Status: AC
  Administered 2021-06-27: 1.4 [IU]/h via INTRAVENOUS
  Filled 2021-06-26: qty 100

## 2021-06-26 MED ORDER — CEFAZOLIN SODIUM-DEXTROSE 2-4 GM/100ML-% IV SOLN
2.0000 g | INTRAVENOUS | Status: AC
  Administered 2021-06-27: 2 g via INTRAVENOUS
  Filled 2021-06-26: qty 100

## 2021-06-26 MED ORDER — EPINEPHRINE HCL 5 MG/250ML IV SOLN IN NS
0.0000 ug/min | INTRAVENOUS | Status: DC
  Filled 2021-06-26: qty 250

## 2021-06-26 MED ORDER — NOREPINEPHRINE 4 MG/250ML-% IV SOLN
0.0000 ug/min | INTRAVENOUS | Status: DC
  Filled 2021-06-26: qty 250

## 2021-06-26 MED ORDER — MILRINONE LACTATE IN DEXTROSE 20-5 MG/100ML-% IV SOLN
0.3000 ug/kg/min | INTRAVENOUS | Status: DC
  Filled 2021-06-26: qty 100

## 2021-06-26 MED ORDER — NITROGLYCERIN IN D5W 200-5 MCG/ML-% IV SOLN
2.0000 ug/min | INTRAVENOUS | Status: DC
  Filled 2021-06-26: qty 250

## 2021-06-26 MED ORDER — VANCOMYCIN HCL 10 G IV SOLR
1500.0000 mg | INTRAVENOUS | Status: AC
  Administered 2021-06-27: 1500 mg via INTRAVENOUS
  Filled 2021-06-26: qty 15

## 2021-06-26 MED ORDER — MAGNESIUM SULFATE 50 % IJ SOLN
40.0000 meq | INTRAMUSCULAR | Status: DC
  Filled 2021-06-26: qty 9.85

## 2021-06-26 MED ORDER — RACEPINEPHRINE HCL 2.25 % IN NEBU
INHALATION_SOLUTION | RESPIRATORY_TRACT | Status: AC
  Filled 2021-06-26: qty 0.5

## 2021-06-26 NOTE — Progress Notes (Signed)
NAME:  Debra Klein, MRN:  [id number], DOB:  1975/10/09, LOS: 7 ADMISSION DATE:  06/19/2021, CONSULTATION DATE:  06/19/21 REFERRING MD:  Laurena Spies, CHIEF COMPLAINT:  respiratory failure   History of Present Illness:  [name] is a 46 year old woman with a history of schizophrenia who presented to the hospital today with acute encephalopathy. History obtained through chart review:   Family reports that about 3 weeks ago the patient started to be less verbal, but was still managing her medications. They were noticing for the last few days they would find a pill or 2 dropped on the floor although she was still overall taking her medications.  Her verbal output was actually improving in the last week or so, but then acutely today she was completely nonverbal and so they came to the ED for further evaluation.  They report she did not have any other complaints and did not notice any fevers or any physical issues other than left leg swelling and pain   Per chart review, she stopped taking her medications approximately 1 week ago, had a fall on 06/15/2021 with left ankle swelling, became nonverbal on 6/6, and subsequently had another fall on 6/7 found to have a left lower leg fracture and discharged with a cam walker boot after radiographs of her left ankle and tibia/fibula revealed previous intramedullary rodding and rod removal of the left tibia without clear evidence of acute fracture.  Subsequently she fell again on 6/8 and was found to be febrile (101.2), tachycardic to 110s, noted to be nonverbal, not following commands, frequently dozing off but withdrawing in all 4 extremities without a facial droop.  She was started on vancomycin, ceftriaxone, acyclovir for meningitis coverage.  Additionally, per hospitalist admission note LP was attempted but unsuccessful at bedside.  Subsequently at approximately 9 PM the patient had a change in mental status (drooling) with gaze deviation to the left and was  intubated. PCCM consulted. Head CT demonstrating an insular hypodensity concerning for MCA infarct.   Pertinent  Medical History  Schizophrenia HTN anemia  Significant Hospital Events: Including procedures, antibiotic start and stop dates in addition to other pertinent events   6/7: Fall --> fibula fracture, Minimally verbal at an outpatient visit with [facility]  6/8: Presented to the ED nonverbal with altered mentation. Progressively worsening mentation with requirement of intubation for airway protection.  Empirically treated for sepsis, presumed meningitis. MRI with large MCA infarct and punctate right hemisphere infarcts.  6/9: Blood cultures obtained on arrival positive for MSSA  6/10 MRI knee demonstrates septic arthritis.  6/13 L knee arthroscopy with I&D with snyovectomy for septic arthritis, TEE 6/14 steroids for no cuff leak  Interim History / Subjective:  More alert this morning, not answering questions still. Tmax 100.1.  Objective   Blood pressure 125/71, pulse 92, temperature 99.8 F (37.7 C), temperature source Axillary, resp. rate 17, height 5\' 6"  (1.676 m), weight 90.2 kg, SpO2 100 %.    Vent Mode: PRVC FiO2 (%):  [30 %] 30 % Set Rate:  [14 bmp-18 bmp] 14 bmp Vt Set:  [470 mL] 470 mL PEEP:  [5 cmH20] 5 cmH20 Pressure Support:  [5 cmH20] 5 cmH20 Plateau Pressure:  [13 cmH20-15 cmH20] 13 cmH20   Intake/Output Summary (Last 24 hours) at 06/26/2021 0713 Last data filed at 06/26/2021 0500 Gross per 24 hour  Intake 1458.42 ml  Output 525 ml  Net 933.42 ml    Filed Weights   06/24/21 0500 06/25/21 0213 06/26/21 0220  Weight: 87.8  kg 89 kg 90.2 kg   Examination: General: critically ill appearing woman lying in bed in NAD HENT: [place]/AT, eyes anicteric Lungs: breathing comfortably on 5/5, minimal rhonchi Cardiovascular: S1S2, minimally tachycardic, reg rhythm Abdomen: soft, NT Extremities: +edema, L knee bandaged, LLE in boot Neuro:  more awake, tracking around  the room today. Following commands to lift hands off the bed bilaterally, wiggles R toes. Weaker RUE than LUE. ~2 second delay following instructions. Derm: warm, dry, no diffuse rashes  K+ 3.4 BUN 17 Cr 0.76 WBC 23.6 H/H 6.3/19.5 Platelets 273 BG 90-120s Blood cultures 6/10> NGTD Synovial fluid> MSSA Resp culture> rare  yeast   Assessment & Plan:  Acute respiratory failure with hypoxia requiring mechanical ventilation -LTVV -VAP prevention protocol -PAD protocol for sedation -daily SAT & SBT; planning for extubation today -completed steroids for concern for upper airway edema -previous discussions with family that if she is unable to control airway secretions and requires reintubation, we would pursue tracheostomy  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 -nafcillin -PT, OT, SLP -on statin for hyperlipidemia, no longer needs aspirin  Sepsis due to MSSA bacteremia, L knee septic arthritis, native MV endocarditis Presume persistent fevers are attributable to endocarditis Worse leukocytosis today may be related to steroids -appreciate management from TCTS to help ensure source control -due to severe neurologic injury, not currently a candidate for mitral valve surgery -appreciate ID's management  AKI due to sepsis from Staph aureus bacteremia, resolved --monitor labs periodically -strict I/Os; needs diuresis -renally dose meds, avoid nephrotoxic meds  Schizophrenia, prominent negative symptoms versus stroke symptoms -Con't PTA clozapine and fluphenazine  Acute metabolic encephalopathy due to sepsis and stroke, may also have prominent negative symptoms from schizophrenia-- significantly improved today, possibly due to steroids over the past day? -goal RASS 0, limiting sedation -hopeful for extubation today -con't clozapine and fluphenazine for  schizophrenia -neuroprotective measures   Hypokalemia -repleted; giving additional repletion plus Mg+ with diuresis today -monitor -check Mg+ level tomorrow  Anemia -transfused 1 unit this morning; transfusion threshold <7 -con't to monitor -monitor for source of bleeding -no compelling clinical signs of DIC  Hypergylcemia -SSI PRN -goal BG 140-180  At risk for malnutrition -con't TF via cortrak -SLP assessment post-extubation  Anasarca -lasix 40mg  today  Best Practice (right click and "Reselect all SmartList Selections" daily)   Diet/type: tubefeeds DVT prophylaxis: LMWH GI prophylaxis: PPI Lines: N/A Foley:  N/A Code Status:  full code Last date of multidisciplinary goals of care discussion [Mother and cousin updated at bedside on 6/14]  This patient is critically ill with multiple organ system failure which requires frequent high complexity decision making, assessment, support, evaluation, and titration of therapies. This was completed through the application of advanced monitoring technologies and extensive interpretation of multiple databases. During this encounter critical care time was devoted to patient care services described in this note for 40  minutes.  Julian Hy, DO 06/26/21 7:54 AM [facility]

## 2021-06-26 NOTE — Progress Notes (Signed)
STROKE TEAM PROGRESS NOTE   INTERVAL HISTORY Mom and RN are at the bedside. Pt was extubated today and tolerating well, lethargic but open eyes on voice, able to tell me her age and hospital, but did not answer other orientation questions and quite hypophonic. Generalized weakness, seems symmetrical bilaterally though. Given the dramatic mental change from yesterday and today, will check EEG. Also check MRI to rule out significant HT in preparation for the open heart surgery    Vitals:   06/26/21 0642 06/26/21 0700 06/26/21 0716 06/26/21 0731  BP: 128/77 125/71 126/78   Pulse: 92 92 95 100  Resp: 17 17 15 19   Temp:  99.8 F (37.7 C) 99.9 F (37.7 C)   TempSrc:  Axillary Axillary   SpO2: 97% 100% 100% 100%  Weight:      Height:       CBC:  Recent Labs  Lab 06/24/21 0244 06/25/21 0324 06/26/21 0230  WBC 23.2* 21.9* 23.6*  NEUTROABS 18.8* 19.5*  --   HGB 8.0* 7.0* 6.3*  HCT 24.7* 21.8* 19.5*  MCV 81.0 82.3 81.6  PLT 199 254 384   Basic Metabolic Panel:  Recent Labs  Lab 06/22/21 0335 06/23/21 0620 06/25/21 0324 06/26/21 0230  NA 145 143 140 138  K 3.5 4.4 4.2 3.4*  CL 110 111 109 108  CO2 25 23 21* 23  GLUCOSE 145* 96 103* 145*  BUN 28* 21* 21* 17  CREATININE 0.88 0.88 0.90 0.76  CALCIUM 8.0* 8.3* 8.1* 7.8*  MG 1.8 1.8  --   --   PHOS 2.9 3.5  --   --    Lipid Panel:  Recent Labs  Lab 06/20/21 0200 06/20/21 0301 06/23/21 0620  CHOL 161  --   --   TRIG 522*   < > 157*  HDL <10*  --   --   CHOLHDL  UNABLE TO CALCULATE  --   --   VLDL UNABLE TO CALCULATE  --   --   LDLCALC UNABLE TO CALCULATE IF TRIGLYCERIDE OVER 400 mg/dL  --   --    < > = values in this interval not displayed.   HgbA1c:  Recent Labs  Lab 06/20/21 0015  HGBA1C 6.5*   Urine Drug Screen:  Recent Labs  Lab 06/19/21 1330  LABOPIA NONE DETECTED  COCAINSCRNUR NONE DETECTED  LABBENZ NONE DETECTED  AMPHETMU NONE DETECTED  THCU NONE DETECTED  LABBARB NONE DETECTED    Alcohol Level   Recent Labs  Lab 06/19/21 1341  ETH <10    IMAGING past 24 hours DG Abd Portable 1V  Result Date: 06/25/2021 CLINICAL DATA:  Feeding tube placement EXAM: PORTABLE ABDOMEN - 1 VIEW COMPARISON:  06/19/2021 FINDINGS: Single supine view of the abdomen and pelvis with the far left abdomen excluded. Feeding tube terminates at the distal stomach. Nasogastric tube is presumably been removed. Non-obstructive bowel gas pattern. No gross free intraperitoneal air. IMPRESSION: Feeding tube terminating at the distal stomach. Electronically Signed   By: Abigail Miyamoto M.D.   On: 06/25/2021 10:17    PHYSICAL EXAM  Temp:  [97.9 F (36.6 C)-100.1 F (37.8 C)] 99.9 F (37.7 C) (06/15 0716) Pulse Rate:  [85-123] 100 (06/15 0731) Resp:  [15-24] 19 (06/15 0731) BP: (86-132)/(62-113) 126/78 (06/15 0716) SpO2:  [95 %-100 %] 100 % (06/15 0731) FiO2 (%):  [30 %] 30 % (06/15 0731) Weight:  [90.2 kg] 90.2 kg (06/15 0220)  General - Well nourished, well developed, lethargic but open eyes  on voice.  Ophthalmologic - fundi not visualized due to noncooperation.  Cardiovascular - Regular rate and rhythm.  Neuro - lethargic, open eyes on voice, able to tell me her age and the place, but not other orientation questions. Able to follow limited commands and then stopped to follow, very hypophonic and did not name or repeat. Eyes in mid position, tracking slowly bilaterally, blinking to visual threat bilaterally, mild right facial droop.  Tongue protrusion not cooperative. BUEs proximal 0/5, bicep 3/5, finger grip 2/5, BLEs 1-2/5 even with pain. DTR diminished and no babinski. Sensation, coordination not cooperative and gait not tested.   ASSESSMENT/PLAN [name] is a 46 y.o. female with history of schizophrenia, HTN and HLD presenting with AMS, fever and acute onset left gaze deviation.  She may have had seizure activity at this time, cEEG ongoing.  She was intubated for airway protection.  CT head was  concerning for left sided stroke, and CTA revealed left MCA M2 branch occlusion.  MRI brain reveals large left MCA territory infarct and scattered acute infarcts in right hemisphere and left occipital lobe.  Stroke: Left MCA infarct and small scattered acute infarcts likely secondary due to embolism from bacterial endocarditis CT head Loss of gray-white matter differentiation along left insula CTA head & neck occlusion of left MCA M2 branch in insula.  No other intracranial arterial occlusion or high grade stenosis MRI  large left MCA territory cortical infarct, scattered punctate foci of acute ischemia in right hemisphere and left occipital lobe MRI and MRA repeat pending 2D Echo EF 50%.  Small vegetation noted on the mitral valve.  TEE showed large mobile MV vegetation LDL direct 27.7 HgbA1c 6.5 UDS negative VTE prophylaxis - lovenox  No antithrombotic prior to admission, now on no antithrombotics given the stroke cause likely due to endocarditis. Therapy recommendations: CLR  disposition:  pending  Seizure-like activity Patient had possible seizure on 6/8 with new onset left gaze deviation and altered mental status EEG and long-term EEG moderate to severe encephalopathy, no seizure EEG d/c'd No AED needed at this time Repeat EEG pending given dramatic mental change from yesterday to today  Endocarditis Blood culture MSSA Sepsis bacteremia on nafcillin  2D Echo Small vegetation noted on the mitral valve.  TEE showed large mobile MV vegetation CTS on board, plan for cardiac surgery tomorrow if MRI no significant HT  Left knee septic joint Status post left knee washout Surgery on board Dressing dry and clean  Acute Hypoxic Respiratory Failure Patient was intubated for airway protection Ventilator management per CCM Extubated 6/15 So far tolerating well  Hypertension Home meds:  none Stable BP goal normotensive  Hyperlipidemia Home meds:  atorvastatin 20 mg daily LDL  27.7, goal < 70 No need of Lipitor at this time given low LDL  Anemia  Hb 8.0->7.0->6.3->PRBC->8.1 CBC monitoring  Schizophrenia Patient has history of schizophrenia Had stopped taking medications about a week prior to admission and had become nonverbal May consider to consult psychiatry when patient extubated  Other Stroke Risk Factors   Other Active Problems Leukocytosis WBC 23.2->21.9->23.6 (related to infection and steroids use) Hypokalemia K 3.4 - supplement  Hospital day # 7  This patient is critically ill due to respiratory distress, endocarditis, left MCA stroke, septic knee, severe anemia and at significant risk of neurological worsening, death form heart failure, sepsis, recurrent stroke, seizure, severe anemia. This patient's care requires constant monitoring of vital signs, hemodynamics, respiratory and cardiac monitoring, review of multiple databases, neurological  assessment, discussion with family, other specialists and medical decision making of high complexity. I spent 40 minutes of neurocritical care time in the care of this patient. I had long discussion with mom at bedside, updated pt current condition, treatment plan and potential prognosis, and answered all the questions. She expressed understanding and appreciation. I also discussed with Dr. Carlis Abbott CCM and Dr. Roxan Hockey CTS.     Rosalin Hawking, MD PhD Stroke Neurology 06/26/2021 7:56 AM    To contact Stroke Continuity provider, please refer to [email or web address] After hours, contact General Neurology

## 2021-06-26 NOTE — Progress Notes (Signed)
2 Days Post-Op Procedure(s) (LRB): ARTHROSCOPY LEFT KNEE IRRIGRATION AND DEBRIEDMENT AND SYNOVECTOMY (Left) Subjective: I saw Debra Klein earlier today. She is extubated and following commands. Right hemiparesis persists but she is moving the right arm minimally  Objective: Vital signs in last 24 hours: Temp:  [97.9 F (36.6 C)-100.1 F (37.8 C)] 98.6 F (37 C) (06/15 1600) Pulse Rate:  [75-110] 104 (06/15 1600) Cardiac Rhythm: Normal sinus rhythm;Sinus tachycardia (06/15 0800) Resp:  [15-23] 18 (06/15 1600) BP: (86-149)/(66-118) 145/81 (06/15 1600) SpO2:  [95 %-100 %] 100 % (06/15 1600) FiO2 (%):  [30 %] 30 % (06/15 0731) Weight:  [90.2 kg] 90.2 kg (06/15 0220)  Hemodynamic parameters for last 24 hours:    Intake/Output from previous day: 06/14 0701 - 06/15 0700 In: 1615.9 [I.V.:5.8; NG/GT:1030; IV Piggyback:520.1] Out: 725 [Urine:525; Stool:200] Intake/Output this shift: Total I/O In: 963.3 [I.V.:0.2; Blood:315; NG/GT:440; IV Piggyback:208.2] Out: 2100 [Urine:2100]  General appearance: cooperative and no distress Neurologic: see above Heart: regular rate and rhythm and 2/6 systolic murmur Lungs: clear to auscultation bilaterally Extremities: well perfused  Lab Results: Recent Labs    06/25/21 0324 06/26/21 0230 06/26/21 1043  WBC 21.9* 23.6*  --   HGB 7.0* 6.3* 8.1*  HCT 21.8* 19.5* 25.1*  PLT 254 273  --    BMET:  Recent Labs    06/25/21 0324 06/26/21 0230  NA 140 138  K 4.2 3.4*  CL 109 108  CO2 21* 23  GLUCOSE 103* 145*  BUN 21* 17  CREATININE 0.90 0.76  CALCIUM 8.1* 7.8*    PT/INR: No results for input(s): "LABPROT", "INR" in the last 72 hours. ABG    Component Value Date/Time   PHART 7.48 (H) 06/19/2021 2241   HCO3 25.7 06/19/2021 2241   O2SAT 99.9 06/19/2021 2241   CBG (last 3)  Recent Labs    06/26/21 0806 06/26/21 1116 06/26/21 1534  GLUCAP 149* 140* 116*    Assessment/Plan: S/P Procedure(s) (LRB): ARTHROSCOPY LEFT KNEE  IRRIGRATION AND DEBRIEDMENT AND SYNOVECTOMY (Left) - Mitral valve endocarditis secondary to Staph aureus, complicated by left hemisphere CVA with right hemiparesis. TEE shows a large 1.5 cm vegetation, moderate MR. WBC persistently elevated. Afebrile last 24 hours. Respiratory cultures are negative and most recent blood cultures are no growth at 5 days. Despite multiple indications to proceed with mitral surgery was not a candidate due to altered mental status and inability to follow commands. Her neurologic status has improved dramatically over the past 24 hours.  At this point I think we have a window of opportunity to proceed with mitral repair/ replacement. She is responsive and not in overt heart failure.  Of course it is high risk but delaying surgery is also.  I discussed these issues with Dr. Carlis Abbott and Dr. Erlinda Hong.  Our consensus was to proceed with surgery if MR does not show any signs of intracranial bleed. She is at MR currently.  I discussed the proposed procedure with her mother and her cousin who is a Engineer, drilling.  We discussed the general nature of the procedure including the need for general anesthesia, the use of cardiopulmonary bypass, the use of drainage tubes and pacing wires postoperatively.  I informed her mother of the indications, risks, benefits and alternatives (continuing with medical therapy). She understands the risks include but are not limited to death, stroke, MI, bleeding, possible need for transfusion, infection, as well other organ system dysfunction including respiratory, renal and or GI complications.  She gives consent to proceed.  For OR tomorrow pending result of MR  Her mother understands this is a high risk procedure in this setting and to expect a prolonged postoperative course.   LOS: 7 days    Debra Klein 06/26/2021

## 2021-06-26 NOTE — Progress Notes (Signed)
CSW continuing to follow for discharge planning needs once medically stable.   Gilmore Laroche, MSW, [facility]

## 2021-06-26 NOTE — Progress Notes (Signed)
SLP Cancellation Note  Patient Details Name: Debra Klein MRN: [id number] DOB: 01-08-1976   Cancelled treatment:        Order received for swallow and cognitive assessment. Extubated this am at 9 after 7 days. RN report her neuro assessment waxes and wanes. She could benefit to wait until tomorrow.    Houston Siren 06/26/2021, 12:44 PM

## 2021-06-26 NOTE — Anesthesia Postprocedure Evaluation (Signed)
Anesthesia Post Note  Patient: Falicia Lizotte  Procedure(s) Performed: ARTHROSCOPY LEFT KNEE IRRIGRATION AND DEBRIEDMENT AND SYNOVECTOMY (Left: Knee)     Patient location during evaluation: SICU Anesthesia Type: General Level of consciousness: sedated Pain management: pain level controlled Vital Signs Assessment: post-procedure vital signs reviewed and stable Respiratory status: patient remains intubated per anesthesia plan Cardiovascular status: stable Postop Assessment: no apparent nausea or vomiting Anesthetic complications: no   No notable events documented.  Last Vitals:  Vitals:   06/26/21 0900 06/26/21 1000  BP:  (!) 149/85  Pulse: 94 (!) 110  Resp: 19 (!) 21  Temp:    SpO2: 99% 100%    Last Pain:  Vitals:   06/26/21 0838  TempSrc: Axillary                 Kyonna Frier

## 2021-06-26 NOTE — Progress Notes (Signed)
Pre-MVR study completed.   Please see CV Proc for preliminary results.   Darlin Coco, RDMS, RVT

## 2021-06-26 NOTE — Progress Notes (Signed)
[facility] ADULT ICU REPLACEMENT PROTOCOL   The patient does apply for the [facility] Adult ICU Electrolyte Replacment Protocol based on the criteria listed below:   1.Exclusion criteria: TCTS patients, ECMO patients, and Dialysis patients 2. Is GFR >/= 30 ml/min? Yes.    Patient's GFR today is >60 3. Is SCr </= 2? Yes.   Patient's SCr is 0.76 mg/dL 4. Did SCr increase >/= 0.5 in 24 hours? No. 5.Pt's weight >40kg  Yes.   6. Abnormal electrolyte(s): K+ 3.4  7. Electrolytes replaced per protocol 8.  Call MD STAT for K+ </= 2.5, Phos </= 1, or Mag </= 1 Physician:  n/a  Darlys Gales 06/26/2021 4:19 AM

## 2021-06-26 NOTE — Progress Notes (Signed)
[facility] Progress Note Patient Name: Debra Klein DOB: [birth year] MRN: [id number]   Date of Service  06/26/2021  HPI/Events of Note  Hemoglobin of  6.3 gm / dl, no evidence of overt bleeding.  eICU Interventions  Order to transfuse 1 unit of PRBC entered, serial H & H.        Kerry Kass Mahiya Kercheval 06/26/2021, 5:05 AM

## 2021-06-26 NOTE — Procedures (Signed)
Extubation Procedure Note  Patient Details:   Name: Geetika Laborde DOB: [birth year] MRN: [id number]   Airway Documentation:    Vent end date: 06/26/21 Vent end time: 0900   Evaluation  O2 sats: stable throughout Complications: No apparent complications Patient did tolerate procedure well. Bilateral Breath Sounds: Clear, Diminished  Cuff leak positive for air leak, no stridor noted.   Yes   Aviendha Azbell J Bonilla Graycee Greeson 06/26/2021, 9:06 AM

## 2021-06-26 NOTE — Progress Notes (Signed)
Nutrition Follow-up  DOCUMENTATION CODES:   Not applicable  INTERVENTION:   Tube feeding via cortrak tube: Osmolite 1.5 at 55 ml/h (1320 ml per day) Prosource TF 45 ml TID  Provides 2100 kcal, 115 gm protein, 1003 ml free water daily   NUTRITION DIAGNOSIS:   Increased nutrient needs related to  (endocarditis) as evidenced by estimated needs. Ongoing.   GOAL:   Patient will meet greater than or equal to 90% of their needs Met with TF at goal.   MONITOR:   TF tolerance  REASON FOR ASSESSMENT:   Consult Enteral/tube feeding initiation and management  ASSESSMENT:   Pt with PMH of schizophrenia, HTN, and anemia admitted from home where she lives with her mother with embolic CVA and MSSA bacteremia due to suspected endocarditis.   Pt discussed during ICU rounds and with RN and MD. Pt extubated this am, not ready for SLP eval. Has cortrak tube for nutrition.  MRI pending due to current mental status, Cardiothoracic surgery following for possible surgery for moderate MR.   6/13 L knee arthroscopy with I&D, TEE  6/14 s/p cortrak placement; tip distal stomach  6/15 extubated; not ready for SLP eval   Medications reviewed and include: nutrisource fiber, SSI, protonix, vitamin B12 1000 mcg daily  Labs reviewed: K 3.4 Vitamin B12: 245    Diet Order:   Diet Order             Diet NPO time specified  Diet effective now                   EDUCATION NEEDS:   No education needs have been identified at this time  Skin:  Skin Assessment:  (knee wound from recent fall)  Last BM:  200 ml via rectal tube  Height:   Ht Readings from Last 1 Encounters:  06/19/21 5' 6" (1.676 m)    Weight:   Wt Readings from Last 1 Encounters:  06/26/21 90.2 kg   BMI:  Body mass index is 32.1 kg/m.  Estimated Nutritional Needs:   Kcal:  2000-2200  Protein:  115-130 grams  Fluid:  >2L/day  Lockie Pares., RD, LDN, CNSC See AMiON for contact information

## 2021-06-26 NOTE — Progress Notes (Signed)
Physical Therapy Treatment Patient Details Name: Debra Klein MRN: [id number] DOB: 04-28-1975 Today's Date: 06/26/2021   History of Present Illness 46 y.o. female presents to [facility] 06/19/2021 with AMS, poor PO intake and recent falls, one resulting in L ankle fx. Pt admitted with severe sepsis of unknown source. MRI 6/8 shows large L MCA infarct. Pt required intubation 6/8 - 6/14. PMH includes: schizophrenia, HTN, anemia.    PT Comments    The pt presents with continued lethargy and limited command following at this time. She continues to assist with transition to sitting EOB through use of core to sit up, but requires maxA to manage bilateral LE and UE. Once positioned, the pt maintained with min-modA, but did not follow any simple commands during the session despite max multimodal cues and stimulation. She did visually track towards voice of PT, RN, and her mother x1 each, but did not maintain eye contact or attention. VSS on 4L O2 with all mobility, will continue to benefit from skilled PT to progress functional strength, motor control, and activity tolerance.     Recommendations for follow up therapy are one component of a multi-disciplinary discharge planning process, led by the attending physician.  Recommendations may be updated based on patient status, additional functional criteria and insurance authorization.  Follow Up Recommendations  Acute inpatient rehab (3hours/day)     Assistance Recommended at Discharge Frequent or constant Supervision/Assistance  Patient can return home with the following Two people to help with walking and/or transfers;Two people to help with bathing/dressing/bathroom;Assistance with cooking/housework;Assistance with feeding;Direct supervision/assist for medications management;Direct supervision/assist for financial management;Assist for transportation;Help with stairs or ramp for entrance   Equipment Recommendations   (defer to post acute)     Recommendations for Other Services       Precautions / Restrictions Precautions Precautions: Fall Precaution Comments: rectal pouch, purewick, NG tube, feeding tube oral with NG tube, bil mittens Restrictions Weight Bearing Restrictions: Yes LLE Weight Bearing: Weight bearing as tolerated Other Position/Activity Restrictions: pt OK to not wear CAM boot per Dr. Ninfa Linden     Mobility  Bed Mobility Overal bed mobility: Needs Assistance Bed Mobility: Rolling Rolling: Max assist, +2 for physical assistance         General bed mobility comments: pt assisting some with core to complete transition from supine to sitting, no assist with LE or UE to complete movement. leaning/falling to R once in seated position, minA-modA to maintain static sitting    Transfers                   General transfer comment: too lethargic      Modified Rankin (Stroke Patients Only) Modified Rankin (Stroke Patients Only) Pre-Morbid Rankin Score: No significant disability Modified Rankin: Severe disability     Balance Overall balance assessment: Needs assistance Sitting-balance support: Single extremity supported, Feet unsupported Sitting balance-Leahy Scale: Zero Sitting balance - Comments: full support needed. Tried to facilitate sitting away from the back of the bed but pt not using LUE effectively enough to do this                                    Cognition Arousal/Alertness: Lethargic Behavior During Therapy: Flat affect Overall Cognitive Status: Difficult to assess Area of Impairment: Attention, Following commands                   Current Attention Level: Focused  General Comments: pt visually tracking to sounds of PT or RN voice 2-3x per session, not maintaining eye contact past initial glance, did not follow commands with bilat UE or bilat LE. did stick out tongue for RN at end of session        Exercises General Exercises - Lower  Extremity Ankle Circles/Pumps: PROM, Both, 10 reps, Seated Long Arc Quad: PROM, Both, 10 reps, Seated Hip Flexion/Marching: PROM, Both, 10 reps, Seated    General Comments General comments (skin integrity, edema, etc.): VSS on 4L O2      Pertinent Vitals/Pain Pain Assessment Pain Assessment: Faces Faces Pain Scale: Hurts little more Pain Location: LLE with any movement Pain Descriptors / Indicators: Grimacing Pain Intervention(s): Monitored during session, Repositioned     PT Goals (current goals can now be found in the care plan section) Acute Rehab PT Goals Patient Stated Goal: none stated PT Goal Formulation: With patient/family Time For Goal Achievement: 07/06/21 Potential to Achieve Goals: Fair Progress towards PT goals: Not progressing toward goals - comment (lethargy)    Frequency    Min 4X/week      PT Plan Current plan remains appropriate       AM-PAC PT "6 Clicks" Mobility   Outcome Measure  Help needed turning from your back to your side while in a flat bed without using bedrails?: Total Help needed moving from lying on your back to sitting on the side of a flat bed without using bedrails?: Total Help needed moving to and from a bed to a chair (including a wheelchair)?: Total Help needed standing up from a chair using your arms (e.g., wheelchair or bedside chair)?: Total Help needed to walk in hospital room?: Total Help needed climbing 3-5 steps with a railing? : Total 6 Click Score: 6    End of Session Equipment Utilized During Treatment: Oxygen Activity Tolerance: Patient limited by lethargy Patient left: in bed;with call bell/phone within reach;with restraints reapplied;with family/visitor present Nurse Communication: Mobility status PT Visit Diagnosis: Other abnormalities of gait and mobility (R26.89);Muscle weakness (generalized) (M62.81);Hemiplegia and hemiparesis Hemiplegia - Right/Left: Right Hemiplegia - dominant/non-dominant:  Dominant Hemiplegia - caused by: Cerebral infarction     Time: 4462-8638 PT Time Calculation (min) (ACUTE ONLY): 25 min  Charges:  $Therapeutic Activity: 23-37 mins                     West Carbo, PT, DPT   Acute Rehabilitation Department   Sandra Cockayne 06/26/2021, 1:01 PM

## 2021-06-26 NOTE — Progress Notes (Signed)
Patient ID: Debra Klein, female   DOB: September 11, 1975, 46 y.o.   MRN: [id number] The patient is postoperative day 2 status post an arthroscopic irrigation debridement with synovectomy of her left knee.  She is now extubated.  I did remove all the dressings from her left knee.  There is minimal swelling.  There is no redness.  I placed a small Band-Aid over both arthroscopy portal sites.  There is no further surgery anticipated for her left knee at this standpoint.  Attempts to mobilize her from an orthopedic standpoint are reasonable.  She does not need a cam walking boot.  There are no fractures that are new of her left lower extremity.  She was placed in a cam walking boot over a week ago after mechanical fall and there was noted swelling with her left ankle.  This was placed on her out of precaution.  I have reviewed his x-rays and there is no acute fracture of her ankle.  Thus, she can put full weight on that left lower extremity as comfort allows and no boot is needed.  If there are any other concerns from orthopedic standpoint, do not hesitate to reach out.

## 2021-06-27 ENCOUNTER — Inpatient Hospital Stay (HOSPITAL_COMMUNITY): Payer: Medicare Other

## 2021-06-27 ENCOUNTER — Other Ambulatory Visit: Payer: Self-pay

## 2021-06-27 ENCOUNTER — Inpatient Hospital Stay (HOSPITAL_COMMUNITY): Payer: Medicare Other | Admitting: Certified Registered Nurse Anesthetist

## 2021-06-27 ENCOUNTER — Encounter (HOSPITAL_COMMUNITY): Payer: Self-pay | Admitting: Critical Care Medicine

## 2021-06-27 ENCOUNTER — Encounter (HOSPITAL_COMMUNITY)
Admission: EM | Disposition: A | Discharge status: Rehabilitation Facility | Payer: Self-pay | Source: Home / Self Care | Attending: Internal Medicine

## 2021-06-27 DIAGNOSIS — Z9889 Other specified postprocedural states: Secondary | ICD-10-CM | POA: Diagnosis not present

## 2021-06-27 DIAGNOSIS — R1310 Dysphagia, unspecified: Secondary | ICD-10-CM

## 2021-06-27 DIAGNOSIS — I10 Essential (primary) hypertension: Secondary | ICD-10-CM

## 2021-06-27 DIAGNOSIS — J90 Pleural effusion, not elsewhere classified: Secondary | ICD-10-CM

## 2021-06-27 DIAGNOSIS — N179 Acute kidney failure, unspecified: Secondary | ICD-10-CM | POA: Diagnosis not present

## 2021-06-27 DIAGNOSIS — I340 Nonrheumatic mitral (valve) insufficiency: Secondary | ICD-10-CM | POA: Diagnosis not present

## 2021-06-27 DIAGNOSIS — J9601 Acute respiratory failure with hypoxia: Secondary | ICD-10-CM | POA: Diagnosis not present

## 2021-06-27 DIAGNOSIS — G9340 Encephalopathy, unspecified: Secondary | ICD-10-CM | POA: Diagnosis not present

## 2021-06-27 DIAGNOSIS — A4101 Sepsis due to Methicillin susceptible Staphylococcus aureus: Secondary | ICD-10-CM | POA: Diagnosis not present

## 2021-06-27 DIAGNOSIS — Z8673 Personal history of transient ischemic attack (TIA), and cerebral infarction without residual deficits: Secondary | ICD-10-CM | POA: Diagnosis not present

## 2021-06-27 DIAGNOSIS — I63512 Cerebral infarction due to unspecified occlusion or stenosis of left middle cerebral artery: Secondary | ICD-10-CM | POA: Diagnosis not present

## 2021-06-27 DIAGNOSIS — I63412 Cerebral infarction due to embolism of left middle cerebral artery: Secondary | ICD-10-CM | POA: Diagnosis not present

## 2021-06-27 HISTORY — PX: MITRAL VALVE REPAIR: SHX2039

## 2021-06-27 HISTORY — PX: TEE WITHOUT CARDIOVERSION: SHX5443

## 2021-06-27 LAB — URINALYSIS, ROUTINE W REFLEX MICROSCOPIC
Bilirubin Urine: NEGATIVE
Glucose, UA: NEGATIVE mg/dL
Hgb urine dipstick: NEGATIVE
Ketones, ur: NEGATIVE mg/dL
Nitrite: NEGATIVE
Protein, ur: NEGATIVE mg/dL
Specific Gravity, Urine: 1.017 (ref 1.005–1.030)
pH: 5 (ref 5.0–8.0)

## 2021-06-27 LAB — POCT I-STAT, CHEM 8
BUN: 14 mg/dL (ref 6–20)
BUN: 14 mg/dL (ref 6–20)
BUN: 13 mg/dL (ref 6–20)
BUN: 13 mg/dL (ref 6–20)
BUN: 13 mg/dL (ref 6–20)
Calcium, Ion: 1.21 mmol/L (ref 1.15–1.40)
Calcium, Ion: 1.17 mmol/L (ref 1.15–1.40)
Calcium, Ion: 1.08 mmol/L — ABNORMAL LOW (ref 1.15–1.40)
Calcium, Ion: 1.09 mmol/L — ABNORMAL LOW (ref 1.15–1.40)
Calcium, Ion: 1.07 mmol/L — ABNORMAL LOW (ref 1.15–1.40)
Chloride: 108 mmol/L (ref 98–111)
Chloride: 107 mmol/L (ref 98–111)
Chloride: 106 mmol/L (ref 98–111)
Chloride: 108 mmol/L (ref 98–111)
Chloride: 106 mmol/L (ref 98–111)
Creatinine, Ser: 0.6 mg/dL (ref 0.44–1.00)
Creatinine, Ser: 0.5 mg/dL (ref 0.44–1.00)
Creatinine, Ser: 0.5 mg/dL (ref 0.44–1.00)
Creatinine, Ser: 0.4 mg/dL — ABNORMAL LOW (ref 0.44–1.00)
Creatinine, Ser: 0.5 mg/dL (ref 0.44–1.00)
Glucose, Bld: 105 mg/dL — ABNORMAL HIGH (ref 70–99)
Glucose, Bld: 124 mg/dL — ABNORMAL HIGH (ref 70–99)
Glucose, Bld: 155 mg/dL — ABNORMAL HIGH (ref 70–99)
Glucose, Bld: 135 mg/dL — ABNORMAL HIGH (ref 70–99)
Glucose, Bld: 193 mg/dL — ABNORMAL HIGH (ref 70–99)
HCT: 26 % — ABNORMAL LOW (ref 36.0–46.0)
HCT: 29 % — ABNORMAL LOW (ref 36.0–46.0)
HCT: 22 % — ABNORMAL LOW (ref 36.0–46.0)
HCT: 22 % — ABNORMAL LOW (ref 36.0–46.0)
HCT: 28 % — ABNORMAL LOW (ref 36.0–46.0)
Hemoglobin: 8.8 g/dL — ABNORMAL LOW (ref 12.0–15.0)
Hemoglobin: 9.9 g/dL — ABNORMAL LOW (ref 12.0–15.0)
Hemoglobin: 7.5 g/dL — ABNORMAL LOW (ref 12.0–15.0)
Hemoglobin: 7.5 g/dL — ABNORMAL LOW (ref 12.0–15.0)
Hemoglobin: 9.5 g/dL — ABNORMAL LOW (ref 12.0–15.0)
Potassium: 3.5 mmol/L (ref 3.5–5.1)
Potassium: 4 mmol/L (ref 3.5–5.1)
Potassium: 4.2 mmol/L (ref 3.5–5.1)
Potassium: 3.9 mmol/L (ref 3.5–5.1)
Potassium: 4 mmol/L (ref 3.5–5.1)
Sodium: 144 mmol/L (ref 135–145)
Sodium: 143 mmol/L (ref 135–145)
Sodium: 141 mmol/L (ref 135–145)
Sodium: 143 mmol/L (ref 135–145)
Sodium: 143 mmol/L (ref 135–145)
TCO2: 24 mmol/L (ref 22–32)
TCO2: 20 mmol/L — ABNORMAL LOW (ref 22–32)
TCO2: 26 mmol/L (ref 22–32)
TCO2: 24 mmol/L (ref 22–32)
TCO2: 19 mmol/L — ABNORMAL LOW (ref 22–32)

## 2021-06-27 LAB — POCT I-STAT 7, (LYTES, BLD GAS, ICA,H+H)
Acid-Base Excess: 0 mmol/L (ref 0.0–2.0)
Acid-Base Excess: 1 mmol/L (ref 0.0–2.0)
Acid-Base Excess: 1 mmol/L (ref 0.0–2.0)
Acid-Base Excess: 2 mmol/L (ref 0.0–2.0)
Acid-base deficit: 3 mmol/L — ABNORMAL HIGH (ref 0.0–2.0)
Acid-base deficit: 6 mmol/L — ABNORMAL HIGH (ref 0.0–2.0)
Acid-base deficit: 7 mmol/L — ABNORMAL HIGH (ref 0.0–2.0)
Acid-base deficit: 3 mmol/L — ABNORMAL HIGH (ref 0.0–2.0)
Acid-base deficit: 3 mmol/L — ABNORMAL HIGH (ref 0.0–2.0)
Bicarbonate: 24.7 mmol/L (ref 20.0–28.0)
Bicarbonate: 25.5 mmol/L (ref 20.0–28.0)
Bicarbonate: 25.6 mmol/L (ref 20.0–28.0)
Bicarbonate: 26.8 mmol/L (ref 20.0–28.0)
Bicarbonate: 23.3 mmol/L (ref 20.0–28.0)
Bicarbonate: 18.6 mmol/L — ABNORMAL LOW (ref 20.0–28.0)
Bicarbonate: 20.1 mmol/L (ref 20.0–28.0)
Bicarbonate: 21.8 mmol/L (ref 20.0–28.0)
Bicarbonate: 22.2 mmol/L (ref 20.0–28.0)
Calcium, Ion: 1.24 mmol/L (ref 1.15–1.40)
Calcium, Ion: 0.99 mmol/L — ABNORMAL LOW (ref 1.15–1.40)
Calcium, Ion: 1.1 mmol/L — ABNORMAL LOW (ref 1.15–1.40)
Calcium, Ion: 1.11 mmol/L — ABNORMAL LOW (ref 1.15–1.40)
Calcium, Ion: 1.06 mmol/L — ABNORMAL LOW (ref 1.15–1.40)
Calcium, Ion: 1.07 mmol/L — ABNORMAL LOW (ref 1.15–1.40)
Calcium, Ion: 1.11 mmol/L — ABNORMAL LOW (ref 1.15–1.40)
Calcium, Ion: 1.29 mmol/L (ref 1.15–1.40)
Calcium, Ion: 1.24 mmol/L (ref 1.15–1.40)
HCT: 20 % — ABNORMAL LOW (ref 36.0–46.0)
HCT: 19 % — ABNORMAL LOW (ref 36.0–46.0)
HCT: 22 % — ABNORMAL LOW (ref 36.0–46.0)
HCT: 23 % — ABNORMAL LOW (ref 36.0–46.0)
HCT: 20 % — ABNORMAL LOW (ref 36.0–46.0)
HCT: 18 % — ABNORMAL LOW (ref 36.0–46.0)
HCT: 31 % — ABNORMAL LOW (ref 36.0–46.0)
HCT: 27 % — ABNORMAL LOW (ref 36.0–46.0)
HCT: 29 % — ABNORMAL LOW (ref 36.0–46.0)
Hemoglobin: 6.8 g/dL — CL (ref 12.0–15.0)
Hemoglobin: 6.5 g/dL — CL (ref 12.0–15.0)
Hemoglobin: 7.5 g/dL — ABNORMAL LOW (ref 12.0–15.0)
Hemoglobin: 7.8 g/dL — ABNORMAL LOW (ref 12.0–15.0)
Hemoglobin: 6.8 g/dL — CL (ref 12.0–15.0)
Hemoglobin: 6.1 g/dL — CL (ref 12.0–15.0)
Hemoglobin: 10.5 g/dL — ABNORMAL LOW (ref 12.0–15.0)
Hemoglobin: 9.2 g/dL — ABNORMAL LOW (ref 12.0–15.0)
Hemoglobin: 9.9 g/dL — ABNORMAL LOW (ref 12.0–15.0)
O2 Saturation: 100 %
O2 Saturation: 100 %
O2 Saturation: 100 %
O2 Saturation: 100 %
O2 Saturation: 100 %
O2 Saturation: 99 %
O2 Saturation: 95 %
O2 Saturation: 95 %
O2 Saturation: 94 %
Patient temperature: 36.7
Patient temperature: 36.5
Patient temperature: 36.5
Potassium: 3.5 mmol/L (ref 3.5–5.1)
Potassium: 4.2 mmol/L (ref 3.5–5.1)
Potassium: 4.1 mmol/L (ref 3.5–5.1)
Potassium: 4.6 mmol/L (ref 3.5–5.1)
Potassium: 3.7 mmol/L (ref 3.5–5.1)
Potassium: 4.1 mmol/L (ref 3.5–5.1)
Potassium: 4 mmol/L (ref 3.5–5.1)
Potassium: 3.8 mmol/L (ref 3.5–5.1)
Potassium: 3.7 mmol/L (ref 3.5–5.1)
Sodium: 144 mmol/L (ref 135–145)
Sodium: 141 mmol/L (ref 135–145)
Sodium: 141 mmol/L (ref 135–145)
Sodium: 141 mmol/L (ref 135–145)
Sodium: 145 mmol/L (ref 135–145)
Sodium: 142 mmol/L (ref 135–145)
Sodium: 143 mmol/L (ref 135–145)
Sodium: 146 mmol/L — ABNORMAL HIGH (ref 135–145)
Sodium: 145 mmol/L (ref 135–145)
TCO2: 26 mmol/L (ref 22–32)
TCO2: 27 mmol/L (ref 22–32)
TCO2: 27 mmol/L (ref 22–32)
TCO2: 28 mmol/L (ref 22–32)
TCO2: 25 mmol/L (ref 22–32)
TCO2: 19 mmol/L — ABNORMAL LOW (ref 22–32)
TCO2: 21 mmol/L — ABNORMAL LOW (ref 22–32)
TCO2: 23 mmol/L (ref 22–32)
TCO2: 23 mmol/L (ref 22–32)
pCO2 arterial: 36.7 mmHg (ref 32–48)
pCO2 arterial: 36.1 mmHg (ref 32–48)
pCO2 arterial: 38.6 mmHg (ref 32–48)
pCO2 arterial: 41.4 mmHg (ref 32–48)
pCO2 arterial: 48.5 mmHg — ABNORMAL HIGH (ref 32–48)
pCO2 arterial: 28.9 mmHg — ABNORMAL LOW (ref 32–48)
pCO2 arterial: 44.1 mmHg (ref 32–48)
pCO2 arterial: 37.4 mmHg (ref 32–48)
pCO2 arterial: 37.6 mmHg (ref 32–48)
pH, Arterial: 7.435 (ref 7.35–7.45)
pH, Arterial: 7.457 — ABNORMAL HIGH (ref 7.35–7.45)
pH, Arterial: 7.419 (ref 7.35–7.45)
pH, Arterial: 7.43 (ref 7.35–7.45)
pH, Arterial: 7.29 — ABNORMAL LOW (ref 7.35–7.45)
pH, Arterial: 7.417 (ref 7.35–7.45)
pH, Arterial: 7.265 — ABNORMAL LOW (ref 7.35–7.45)
pH, Arterial: 7.371 (ref 7.35–7.45)
pH, Arterial: 7.378 (ref 7.35–7.45)
pO2, Arterial: 161 mmHg — ABNORMAL HIGH (ref 83–108)
pO2, Arterial: 482 mmHg — ABNORMAL HIGH (ref 83–108)
pO2, Arterial: 338 mmHg — ABNORMAL HIGH (ref 83–108)
pO2, Arterial: 398 mmHg — ABNORMAL HIGH (ref 83–108)
pO2, Arterial: 467 mmHg — ABNORMAL HIGH (ref 83–108)
pO2, Arterial: 147 mmHg — ABNORMAL HIGH (ref 83–108)
pO2, Arterial: 88 mmHg (ref 83–108)
pO2, Arterial: 76 mmHg — ABNORMAL LOW (ref 83–108)
pO2, Arterial: 72 mmHg — ABNORMAL LOW (ref 83–108)

## 2021-06-27 LAB — CBC
HCT: 25.9 % — ABNORMAL LOW (ref 36.0–46.0)
HCT: 29.5 % — ABNORMAL LOW (ref 36.0–46.0)
Hemoglobin: 8.3 g/dL — ABNORMAL LOW (ref 12.0–15.0)
Hemoglobin: 10.3 g/dL — ABNORMAL LOW (ref 12.0–15.0)
MCH: 26.6 pg (ref 26.0–34.0)
MCH: 29.2 pg (ref 26.0–34.0)
MCHC: 32 g/dL (ref 30.0–36.0)
MCHC: 34.9 g/dL (ref 30.0–36.0)
MCV: 83 fL (ref 80.0–100.0)
MCV: 83.6 fL (ref 80.0–100.0)
Platelets: 420 10*3/uL — ABNORMAL HIGH (ref 150–400)
Platelets: 231 10*3/uL (ref 150–400)
RBC: 3.12 MIL/uL — ABNORMAL LOW (ref 3.87–5.11)
RBC: 3.53 MIL/uL — ABNORMAL LOW (ref 3.87–5.11)
RDW: 18.6 % — ABNORMAL HIGH (ref 11.5–15.5)
RDW: 16.8 % — ABNORMAL HIGH (ref 11.5–15.5)
WBC: 20.6 10*3/uL — ABNORMAL HIGH (ref 4.0–10.5)
WBC: 34.3 10*3/uL — ABNORMAL HIGH (ref 4.0–10.5)
nRBC: 0 % (ref 0.0–0.2)
nRBC: 0 % (ref 0.0–0.2)

## 2021-06-27 LAB — GLUCOSE, CAPILLARY
Glucose-Capillary: 115 mg/dL — ABNORMAL HIGH (ref 70–99)
Glucose-Capillary: 118 mg/dL — ABNORMAL HIGH (ref 70–99)
Glucose-Capillary: 125 mg/dL — ABNORMAL HIGH (ref 70–99)
Glucose-Capillary: 125 mg/dL — ABNORMAL HIGH (ref 70–99)
Glucose-Capillary: 127 mg/dL — ABNORMAL HIGH (ref 70–99)
Glucose-Capillary: 137 mg/dL — ABNORMAL HIGH (ref 70–99)
Glucose-Capillary: 141 mg/dL — ABNORMAL HIGH (ref 70–99)
Glucose-Capillary: 148 mg/dL — ABNORMAL HIGH (ref 70–99)
Glucose-Capillary: 95 mg/dL (ref 70–99)

## 2021-06-27 LAB — COMPREHENSIVE METABOLIC PANEL
ALT: 20 U/L (ref 0–44)
AST: 19 U/L (ref 15–41)
Albumin: 1.5 g/dL — ABNORMAL LOW (ref 3.5–5.0)
Alkaline Phosphatase: 57 U/L (ref 38–126)
Anion gap: 6 (ref 5–15)
BUN: 15 mg/dL (ref 6–20)
CO2: 25 mmol/L (ref 22–32)
Calcium: 8.3 mg/dL — ABNORMAL LOW (ref 8.9–10.3)
Chloride: 112 mmol/L — ABNORMAL HIGH (ref 98–111)
Creatinine, Ser: 0.89 mg/dL (ref 0.44–1.00)
GFR, Estimated: >60 mL/min (ref >60)
Glucose, Bld: 101 mg/dL — ABNORMAL HIGH (ref 70–99)
Potassium: 3.4 mmol/L — ABNORMAL LOW (ref 3.5–5.1)
Sodium: 143 mmol/L (ref 135–145)
Total Bilirubin: 0.8 mg/dL (ref 0.3–1.2)
Total Protein: 7 g/dL (ref 6.5–8.1)

## 2021-06-27 LAB — COOXEMETRY PANEL
Carboxyhemoglobin: 0.4 % — ABNORMAL LOW (ref 0.5–1.5)
Carboxyhemoglobin: 1.2 % (ref 0.5–1.5)
Methemoglobin: <0.7 % (ref 0.0–1.5)
Methemoglobin: 0.9 % (ref 0.0–1.5)
O2 Saturation: 71.1 %
O2 Saturation: 57.7 %
Total hemoglobin: 9.4 g/dL — ABNORMAL LOW (ref 12.0–16.0)
Total hemoglobin: 9.6 g/dL — ABNORMAL LOW (ref 12.0–16.0)

## 2021-06-27 LAB — APTT
aPTT: 35 seconds (ref 24–36)
aPTT: 37 seconds — ABNORMAL HIGH (ref 24–36)

## 2021-06-27 LAB — HEMOGLOBIN AND HEMATOCRIT, BLOOD
HCT: 23.5 % — ABNORMAL LOW (ref 36.0–46.0)
Hemoglobin: 8 g/dL — ABNORMAL LOW (ref 12.0–15.0)

## 2021-06-27 LAB — PROTIME-INR
INR: 1.2 (ref 0.8–1.2)
INR: 1.5 — ABNORMAL HIGH (ref 0.8–1.2)
Prothrombin Time: 15 seconds (ref 11.4–15.2)
Prothrombin Time: 18.1 seconds — ABNORMAL HIGH (ref 11.4–15.2)

## 2021-06-27 LAB — VITAMIN B1: Vitamin B1 (Thiamine): 254.1 nmol/L — ABNORMAL HIGH (ref 66.5–200.0)

## 2021-06-27 LAB — POCT I-STAT EG7
Acid-base deficit: 1 mmol/L (ref 0.0–2.0)
Bicarbonate: 23.1 mmol/L (ref 20.0–28.0)
Calcium, Ion: 1.05 mmol/L — ABNORMAL LOW (ref 1.15–1.40)
HCT: 19 % — ABNORMAL LOW (ref 36.0–46.0)
Hemoglobin: 6.5 g/dL — CL (ref 12.0–15.0)
O2 Saturation: 79 %
Potassium: 4.1 mmol/L (ref 3.5–5.1)
Sodium: 142 mmol/L (ref 135–145)
TCO2: 24 mmol/L (ref 22–32)
pCO2, Ven: 35.9 mmHg — ABNORMAL LOW (ref 44–60)
pH, Ven: 7.417 (ref 7.25–7.43)
pO2, Ven: 42 mmHg (ref 32–45)

## 2021-06-27 LAB — PREPARE RBC (CROSSMATCH)

## 2021-06-27 LAB — BASIC METABOLIC PANEL
Anion gap: 8 (ref 5–15)
BUN: 14 mg/dL (ref 6–20)
CO2: 22 mmol/L (ref 22–32)
Calcium: 8.1 mg/dL — ABNORMAL LOW (ref 8.9–10.3)
Chloride: 112 mmol/L — ABNORMAL HIGH (ref 98–111)
Creatinine, Ser: 0.8 mg/dL (ref 0.44–1.00)
GFR, Estimated: >60 mL/min (ref >60)
Glucose, Bld: 123 mg/dL — ABNORMAL HIGH (ref 70–99)
Potassium: 3.8 mmol/L (ref 3.5–5.1)
Sodium: 142 mmol/L (ref 135–145)

## 2021-06-27 LAB — HEMOGLOBIN A1C
Hgb A1c MFr Bld: 6.1 % — ABNORMAL HIGH (ref 4.8–5.6)
Mean Plasma Glucose: 128.37 mg/dL

## 2021-06-27 LAB — MAGNESIUM: Magnesium: 2.3 mg/dL (ref 1.7–2.4)

## 2021-06-27 LAB — SURGICAL PCR SCREEN
MRSA, PCR: POSITIVE — AB
Staphylococcus aureus: POSITIVE — AB

## 2021-06-27 LAB — PLATELET COUNT: Platelets: 198 10*3/uL (ref 150–400)

## 2021-06-27 LAB — PHOSPHORUS: Phosphorus: 3.4 mg/dL (ref 2.5–4.6)

## 2021-06-27 SURGERY — REPAIR, MITRAL VALVE
Anesthesia: General

## 2021-06-27 MED ORDER — GLUTARALDEHYDE 0.625% SOAKING SOLUTION
TOPICAL | Status: DC | PRN
  Administered 2021-06-27: 1 via TOPICAL

## 2021-06-27 MED ORDER — VANCOMYCIN HCL IN DEXTROSE 1-5 GM/200ML-% IV SOLN
1000.0000 mg | Freq: Once | INTRAVENOUS | Status: AC
Start: 2021-06-27 — End: 2021-06-27
  Administered 2021-06-27: 1000 mg via INTRAVENOUS
  Filled 2021-06-27: qty 200

## 2021-06-27 MED ORDER — METOPROLOL TARTRATE 25 MG PO TABS
12.5000 mg | ORAL_TABLET | Freq: Once | ORAL | Status: AC
Start: 2021-06-27 — End: 2021-06-27

## 2021-06-27 MED ORDER — ALBUMIN HUMAN 5 % IV SOLN
250.0000 mL | INTRAVENOUS | Status: AC | PRN
  Administered 2021-06-27: 12.5 g via INTRAVENOUS
  Filled 2021-06-27 (×2): qty 250

## 2021-06-27 MED ORDER — ACETAMINOPHEN 160 MG/5ML PO SOLN: 650.0000 mg | Freq: Once | ORAL | Status: AC

## 2021-06-27 MED ORDER — PROTAMINE SULFATE 10 MG/ML IV SOLN
INTRAVENOUS | Status: AC
  Filled 2021-06-27: qty 25

## 2021-06-27 MED ORDER — LACTATED RINGERS IV SOLN: 500.0000 mL | Freq: Once | INTRAVENOUS | Status: DC | PRN

## 2021-06-27 MED ORDER — PROPOFOL 10 MG/ML IV BOLUS
INTRAVENOUS | Status: DC | PRN
  Administered 2021-06-27: 90 mg via INTRAVENOUS

## 2021-06-27 MED ORDER — AMIODARONE HCL IN DEXTROSE 360-4.14 MG/200ML-% IV SOLN
INTRAVENOUS | Status: DC | PRN
  Administered 2021-06-27: 60 mg/h via INTRAVENOUS

## 2021-06-27 MED ORDER — FENTANYL CITRATE (PF) 250 MCG/5ML IJ SOLN
INTRAMUSCULAR | Status: AC
  Filled 2021-06-27: qty 5

## 2021-06-27 MED ORDER — BISACODYL 10 MG RE SUPP
10.0000 mg | Freq: Every day | RECTAL | Status: DC
Start: 2021-06-28 — End: 2021-07-12
  Administered 2021-07-07 – 2021-07-11 (×4): 10 mg via RECTAL
  Filled 2021-06-27 (×4): qty 1

## 2021-06-27 MED ORDER — OXYCODONE HCL 5 MG PO TABS
5.0000 mg | ORAL_TABLET | ORAL | Status: DC | PRN
  Filled 2021-06-27: qty 2

## 2021-06-27 MED ORDER — ROCURONIUM BROMIDE 10 MG/ML (PF) SYRINGE
PREFILLED_SYRINGE | INTRAVENOUS | Status: AC
  Filled 2021-06-27: qty 10

## 2021-06-27 MED ORDER — ALBUMIN HUMAN 5 % IV SOLN: INTRAVENOUS | Status: DC | PRN

## 2021-06-27 MED ORDER — PROPOFOL 10 MG/ML IV BOLUS
INTRAVENOUS | Status: AC
  Filled 2021-06-27: qty 20

## 2021-06-27 MED ORDER — POTASSIUM CHLORIDE 20 MEQ PO PACK: 40.0000 meq | PACK | Freq: Once | ORAL | Status: DC

## 2021-06-27 MED ORDER — PHENYLEPHRINE 80 MCG/ML (10ML) SYRINGE FOR IV PUSH (FOR BLOOD PRESSURE SUPPORT)
PREFILLED_SYRINGE | INTRAVENOUS | Status: AC
  Filled 2021-06-27: qty 10

## 2021-06-27 MED ORDER — DOPAMINE-DEXTROSE 3.2-5 MG/ML-% IV SOLN
5.0000 ug/kg/min | INTRAVENOUS | Status: DC
  Administered 2021-06-27: 3 ug/kg/min via INTRAVENOUS
  Administered 2021-06-28: 5 ug/kg/min via INTRAVENOUS
  Filled 2021-06-27 (×2): qty 250

## 2021-06-27 MED ORDER — ACETAMINOPHEN 500 MG PO TABS
1000.0000 mg | ORAL_TABLET | Freq: Four times a day (QID) | ORAL | Status: DC
  Administered 2021-06-29: 1000 mg via ORAL

## 2021-06-27 MED ORDER — BISACODYL 5 MG PO TBEC
10.0000 mg | DELAYED_RELEASE_TABLET | Freq: Every day | ORAL | Status: DC
Start: 2021-06-28 — End: 2021-06-30

## 2021-06-27 MED ORDER — FAMOTIDINE IN NACL 20-0.9 MG/50ML-% IV SOLN
20.0000 mg | Freq: Two times a day (BID) | INTRAVENOUS | Status: AC
  Administered 2021-06-27: 20 mg via INTRAVENOUS
  Filled 2021-06-27 (×2): qty 50

## 2021-06-27 MED ORDER — ONDANSETRON HCL 4 MG/2ML IJ SOLN: 4.0000 mg | Freq: Four times a day (QID) | INTRAMUSCULAR | Status: DC | PRN

## 2021-06-27 MED ORDER — MIDAZOLAM HCL (PF) 5 MG/ML IJ SOLN
INTRAMUSCULAR | Status: DC | PRN
  Administered 2021-06-27: 3 mg via INTRAVENOUS
  Administered 2021-06-27 (×2): 1 mg via INTRAVENOUS

## 2021-06-27 MED ORDER — TRAMADOL HCL 50 MG PO TABS: 50.0000 mg | ORAL_TABLET | ORAL | Status: DC | PRN

## 2021-06-27 MED ORDER — ASPIRIN 81 MG PO CHEW
324.0000 mg | CHEWABLE_TABLET | Freq: Every day | ORAL | Status: DC
Start: 2021-06-28 — End: 2021-06-30
  Administered 2021-06-28: 324 mg
  Filled 2021-06-27: qty 4

## 2021-06-27 MED ORDER — LACTATED RINGERS IV SOLN: INTRAVENOUS | Status: DC

## 2021-06-27 MED ORDER — CHLORHEXIDINE GLUCONATE 0.12 % MT SOLN
15.0000 mL | OROMUCOSAL | Status: AC
  Administered 2021-06-27: 15 mL via OROMUCOSAL

## 2021-06-27 MED ORDER — PHENYLEPHRINE 80 MCG/ML (10ML) SYRINGE FOR IV PUSH (FOR BLOOD PRESSURE SUPPORT)
PREFILLED_SYRINGE | INTRAVENOUS | Status: AC
  Filled 2021-06-27: qty 20

## 2021-06-27 MED ORDER — LACTATED RINGERS IV SOLN: INTRAVENOUS | Status: DC | PRN

## 2021-06-27 MED ORDER — DOCUSATE SODIUM 100 MG PO CAPS: 200.0000 mg | ORAL_CAPSULE | Freq: Every day | ORAL | Status: DC

## 2021-06-27 MED ORDER — AMIODARONE HCL IN DEXTROSE 360-4.14 MG/200ML-% IV SOLN
30.0000 mg/h | INTRAVENOUS | Status: AC
  Administered 2021-06-28 – 2021-07-01 (×5): 30 mg/h via INTRAVENOUS
  Filled 2021-06-27 (×7): qty 200

## 2021-06-27 MED ORDER — SODIUM CHLORIDE 0.9 % IV SOLN: INTRAVENOUS | Status: DC | PRN

## 2021-06-27 MED ORDER — SODIUM CHLORIDE 0.9% FLUSH
3.0000 mL | Freq: Two times a day (BID) | INTRAVENOUS | Status: DC
Start: 2021-06-28 — End: 2021-07-09
  Administered 2021-06-30: 20 mL via INTRAVENOUS
  Administered 2021-06-30 – 2021-07-08 (×12): 3 mL via INTRAVENOUS

## 2021-06-27 MED ORDER — PROTAMINE SULFATE 10 MG/ML IV SOLN
INTRAVENOUS | Status: AC
Start: 2021-06-27 — End: ?
  Filled 2021-06-27: qty 5

## 2021-06-27 MED ORDER — CHLORHEXIDINE GLUCONATE 0.12 % MT SOLN: 15.0000 mL | Freq: Once | OROMUCOSAL | Status: DC

## 2021-06-27 MED ORDER — MIDAZOLAM HCL 2 MG/2ML IJ SOLN
2.0000 mg | INTRAMUSCULAR | Status: DC | PRN
  Administered 2021-06-28 (×3): 2 mg via INTRAVENOUS
  Filled 2021-06-27 (×3): qty 2

## 2021-06-27 MED ORDER — ARTIFICIAL TEARS OPHTHALMIC OINT
TOPICAL_OINTMENT | OPHTHALMIC | Status: AC
  Filled 2021-06-27: qty 3.5

## 2021-06-27 MED ORDER — MILRINONE LACTATE IN DEXTROSE 20-5 MG/100ML-% IV SOLN
INTRAVENOUS | Status: AC
  Filled 2021-06-27: qty 100

## 2021-06-27 MED ORDER — CHLORHEXIDINE GLUCONATE CLOTH 2 % EX PADS
6.0000 | MEDICATED_PAD | Freq: Once | CUTANEOUS | Status: AC
Start: 2021-06-27 — End: 2021-06-27

## 2021-06-27 MED ORDER — PANTOPRAZOLE SODIUM 40 MG PO TBEC: 40.0000 mg | DELAYED_RELEASE_TABLET | Freq: Every day | ORAL | Status: DC

## 2021-06-27 MED ORDER — POTASSIUM CHLORIDE 10 MEQ/100ML IV SOLN
10.0000 meq | INTRAVENOUS | Status: DC
  Administered 2021-06-27: 10 meq via INTRAVENOUS
  Filled 2021-06-27: qty 100

## 2021-06-27 MED ORDER — METOPROLOL TARTRATE 5 MG/5ML IV SOLN: 2.5000 mg | INTRAVENOUS | Status: DC | PRN

## 2021-06-27 MED ORDER — METOPROLOL TARTRATE 12.5 MG HALF TABLET
ORAL_TABLET | ORAL | Status: AC
  Administered 2021-06-27: 12.5 mg via ORAL
  Filled 2021-06-27: qty 1

## 2021-06-27 MED ORDER — SODIUM BICARBONATE 8.4 % IV SOLN
100.0000 meq | Freq: Once | INTRAVENOUS | Status: AC
Start: 2021-06-27 — End: 2021-06-27
  Administered 2021-06-27: 100 meq via INTRAVENOUS

## 2021-06-27 MED ORDER — CEFAZOLIN SODIUM-DEXTROSE 2-4 GM/100ML-% IV SOLN
2.0000 g | Freq: Three times a day (TID) | INTRAVENOUS | Status: AC
  Administered 2021-06-27 – 2021-06-29 (×6): 2 g via INTRAVENOUS
  Filled 2021-06-27 (×6): qty 100

## 2021-06-27 MED ORDER — GLUTARALDEHYDE 0.625% SOAKING SOLUTION
TOPICAL | Status: DC
  Filled 2021-06-27: qty 50

## 2021-06-27 MED ORDER — POTASSIUM CHLORIDE 10 MEQ/50ML IV SOLN: 10.0000 meq | INTRAVENOUS | Status: AC

## 2021-06-27 MED ORDER — MIDAZOLAM HCL (PF) 10 MG/2ML IJ SOLN
INTRAMUSCULAR | Status: AC
  Filled 2021-06-27: qty 2

## 2021-06-27 MED ORDER — MAGNESIUM SULFATE 4 GM/100ML IV SOLN
4.0000 g | Freq: Once | INTRAVENOUS | Status: AC
  Administered 2021-06-27: 4 g via INTRAVENOUS
  Filled 2021-06-27: qty 100

## 2021-06-27 MED ORDER — FENTANYL CITRATE PF 50 MCG/ML IJ SOSY
50.0000 ug | PREFILLED_SYRINGE | INTRAMUSCULAR | Status: DC | PRN
  Administered 2021-06-27 – 2021-06-28 (×4): 50 ug via INTRAVENOUS
  Administered 2021-06-28 (×2): 100 ug via INTRAVENOUS
  Administered 2021-06-28: 50 ug via INTRAVENOUS
  Administered 2021-06-28: 100 ug via INTRAVENOUS
  Filled 2021-06-27 (×3): qty 2
  Filled 2021-06-27 (×5): qty 1

## 2021-06-27 MED ORDER — CALCIUM CHLORIDE 10 % IV SOLN
1.0000 g | Freq: Once | INTRAVENOUS | Status: AC
  Administered 2021-06-27: 1 g via INTRAVENOUS

## 2021-06-27 MED ORDER — ATORVASTATIN CALCIUM 40 MG PO TABS
40.0000 mg | ORAL_TABLET | Freq: Every day | ORAL | Status: DC
  Administered 2021-06-28: 40 mg
  Filled 2021-06-27: qty 1

## 2021-06-27 MED ORDER — METOPROLOL TARTRATE 25 MG/10 ML ORAL SUSPENSION
12.5000 mg | Freq: Two times a day (BID) | ORAL | Status: DC
  Administered 2021-06-30 – 2021-07-11 (×11): 12.5 mg
  Filled 2021-06-27: qty 5
  Filled 2021-06-27: qty 10
  Filled 2021-06-27 (×2): qty 5
  Filled 2021-06-27: qty 10
  Filled 2021-06-27 (×12): qty 5
  Filled 2021-06-27 (×2): qty 10
  Filled 2021-06-27 (×14): qty 5
  Filled 2021-06-27: qty 10
  Filled 2021-06-27 (×2): qty 5
  Filled 2021-06-27: qty 10

## 2021-06-27 MED ORDER — SODIUM CHLORIDE (PF) 0.9 % IJ SOLN
OROMUCOSAL | Status: DC | PRN
  Administered 2021-06-27: 12 mL via TOPICAL

## 2021-06-27 MED ORDER — ALBUMIN HUMAN 5 % IV SOLN
INTRAVENOUS | Status: AC
  Administered 2021-06-27: 12.5 g via INTRAVENOUS
  Filled 2021-06-27: qty 250

## 2021-06-27 MED ORDER — HEPARIN SODIUM (PORCINE) 1000 UNIT/ML IJ SOLN
INTRAMUSCULAR | Status: AC
  Filled 2021-06-27: qty 1

## 2021-06-27 MED ORDER — PHENYLEPHRINE HCL-NACL 20-0.9 MG/250ML-% IV SOLN: 0.0000 ug/min | INTRAVENOUS | Status: DC

## 2021-06-27 MED ORDER — ACETAMINOPHEN 160 MG/5ML PO SOLN
1000.0000 mg | Freq: Four times a day (QID) | ORAL | Status: DC
  Administered 2021-06-28 (×4): 1000 mg
  Filled 2021-06-27 (×5): qty 40.6

## 2021-06-27 MED ORDER — CHLORHEXIDINE GLUCONATE CLOTH 2 % EX PADS
6.0000 | MEDICATED_PAD | Freq: Every day | CUTANEOUS | Status: DC
  Administered 2021-06-27 – 2021-07-17 (×22): 6 via TOPICAL

## 2021-06-27 MED ORDER — NOREPINEPHRINE 4 MG/250ML-% IV SOLN
0.0000 ug/min | INTRAVENOUS | Status: DC
  Administered 2021-06-27: 5 ug/min via INTRAVENOUS
  Filled 2021-06-27: qty 250

## 2021-06-27 MED ORDER — ASPIRIN 325 MG PO TBEC: 325.0000 mg | DELAYED_RELEASE_TABLET | Freq: Every day | ORAL | Status: DC

## 2021-06-27 MED ORDER — MILRINONE LACTATE IN DEXTROSE 20-5 MG/100ML-% IV SOLN
0.2500 ug/kg/min | INTRAVENOUS | Status: DC
  Administered 2021-06-28 (×3): 0.25 ug/kg/min via INTRAVENOUS
  Filled 2021-06-27 (×2): qty 100

## 2021-06-27 MED ORDER — SODIUM CHLORIDE 0.9 % IV SOLN: INTRAVENOUS | Status: DC

## 2021-06-27 MED ORDER — AMIODARONE IV BOLUS ONLY 150 MG/100ML
INTRAVENOUS | Status: DC | PRN
  Administered 2021-06-27: 150 mg via INTRAVENOUS

## 2021-06-27 MED ORDER — DEXTROSE 50 % IV SOLN
0.0000 mL | INTRAVENOUS | Status: DC | PRN
  Administered 2021-06-28: 50 mL via INTRAVENOUS
  Filled 2021-06-27: qty 50

## 2021-06-27 MED ORDER — METOPROLOL TARTRATE 12.5 MG HALF TABLET
12.5000 mg | ORAL_TABLET | Freq: Two times a day (BID) | ORAL | Status: DC
  Administered 2021-07-03 – 2021-07-17 (×23): 12.5 mg via ORAL
  Filled 2021-06-27 (×29): qty 1

## 2021-06-27 MED ORDER — ALBUMIN HUMAN 5 % IV SOLN
250.0000 mL | INTRAVENOUS | Status: AC | PRN
  Administered 2021-06-27 (×4): 12.5 g via INTRAVENOUS
  Filled 2021-06-27 (×2): qty 250

## 2021-06-27 MED ORDER — ACETAMINOPHEN 650 MG RE SUPP
650.0000 mg | Freq: Once | RECTAL | Status: AC
  Administered 2021-06-27: 650 mg via RECTAL

## 2021-06-27 MED ORDER — CHLORHEXIDINE GLUCONATE CLOTH 2 % EX PADS
6.0000 | MEDICATED_PAD | Freq: Once | CUTANEOUS | Status: AC
Start: 2021-06-27 — End: 2021-06-27
  Administered 2021-06-27: 6 via TOPICAL

## 2021-06-27 MED ORDER — FENTANYL CITRATE (PF) 250 MCG/5ML IJ SOLN
INTRAMUSCULAR | Status: DC | PRN
Start: 2021-06-27 — End: 2021-06-27
  Administered 2021-06-27: 300 ug via INTRAVENOUS
  Administered 2021-06-27: 200 ug via INTRAVENOUS
  Administered 2021-06-27: 250 ug via INTRAVENOUS
  Administered 2021-06-27: 50 ug via INTRAVENOUS
  Administered 2021-06-27 (×2): 100 ug via INTRAVENOUS

## 2021-06-27 MED ORDER — SODIUM CHLORIDE 0.9% FLUSH: 3.0000 mL | INTRAVENOUS | Status: DC | PRN

## 2021-06-27 MED ORDER — HEPARIN SODIUM (PORCINE) 1000 UNIT/ML IJ SOLN
INTRAMUSCULAR | Status: DC | PRN
  Administered 2021-06-27: 30000 [IU] via INTRAVENOUS

## 2021-06-27 MED ORDER — SUCCINYLCHOLINE CHLORIDE 200 MG/10ML IV SOSY
PREFILLED_SYRINGE | INTRAVENOUS | Status: AC
  Filled 2021-06-27: qty 10

## 2021-06-27 MED ORDER — INSULIN REGULAR(HUMAN) IN NACL 100-0.9 UT/100ML-% IV SOLN: INTRAVENOUS | Status: DC

## 2021-06-27 MED ORDER — PLASMA-LYTE A IV SOLN
INTRAVENOUS | Status: DC | PRN
  Administered 2021-06-27: 500 mL via INTRAVASCULAR

## 2021-06-27 MED ORDER — SODIUM CHLORIDE 0.9 % IV SOLN: 250.0000 mL | INTRAVENOUS | Status: DC

## 2021-06-27 MED ORDER — ROCURONIUM BROMIDE 10 MG/ML (PF) SYRINGE
PREFILLED_SYRINGE | INTRAVENOUS | Status: DC | PRN
  Administered 2021-06-27: 50 mg via INTRAVENOUS
  Administered 2021-06-27: 30 mg via INTRAVENOUS
  Administered 2021-06-27 (×2): 50 mg via INTRAVENOUS
  Administered 2021-06-27: 70 mg via INTRAVENOUS
  Administered 2021-06-27: 50 mg via INTRAVENOUS

## 2021-06-27 MED ORDER — SODIUM CHLORIDE 0.45 % IV SOLN: INTRAVENOUS | Status: DC | PRN

## 2021-06-27 MED ORDER — 0.9 % SODIUM CHLORIDE (POUR BTL) OPTIME
TOPICAL | Status: DC | PRN
  Administered 2021-06-27 (×3): 1000 mL
  Administered 2021-06-27: 5000 mL

## 2021-06-27 MED ORDER — PROTAMINE SULFATE 10 MG/ML IV SOLN
INTRAVENOUS | Status: DC | PRN
  Administered 2021-06-27: 300 mg via INTRAVENOUS

## 2021-06-27 MED ORDER — DEXMEDETOMIDINE HCL IN NACL 400 MCG/100ML IV SOLN
0.0000 ug/kg/h | INTRAVENOUS | Status: DC
  Administered 2021-06-27: 0.7 ug/kg/h via INTRAVENOUS
  Administered 2021-06-28: 1 ug/kg/h via INTRAVENOUS
  Administered 2021-06-28: 0.7 ug/kg/h via INTRAVENOUS
  Administered 2021-06-28: 0.8 ug/kg/h via INTRAVENOUS
  Filled 2021-06-27 (×6): qty 100

## 2021-06-27 MED ORDER — HEMOSTATIC AGENTS (NO CHARGE) OPTIME
TOPICAL | Status: DC | PRN
  Administered 2021-06-27: 1 via TOPICAL

## 2021-06-27 MED ORDER — NITROGLYCERIN IN D5W 200-5 MCG/ML-% IV SOLN: 0.0000 ug/min | INTRAVENOUS | Status: DC

## 2021-06-27 MED ORDER — LIDOCAINE 2% (20 MG/ML) 5 ML SYRINGE
INTRAMUSCULAR | Status: AC
  Filled 2021-06-27: qty 5

## 2021-06-27 MED ORDER — POTASSIUM CHLORIDE 10 MEQ/50ML IV SOLN
10.0000 meq | INTRAVENOUS | Status: AC
  Administered 2021-06-27: 10 meq via INTRAVENOUS

## 2021-06-27 MED ORDER — CALCIUM GLUCONATE-NACL 2-0.675 GM/100ML-% IV SOLN
2.0000 g | Freq: Once | INTRAVENOUS | Status: DC
Start: 2021-06-27 — End: 2021-06-30
  Filled 2021-06-27: qty 100

## 2021-06-27 SURGICAL SUPPLY — 93 items
ADAPTER CARDIO PERF ANTE/RETRO (ADAPTER) ×2 IMPLANT
BAG DECANTER FOR FLEXI CONT (MISCELLANEOUS) ×2 IMPLANT
BLADE CLIPPER SURG (BLADE) ×2 IMPLANT
BLADE STERNUM SYSTEM 6 (BLADE) ×2 IMPLANT
BLADE SURG 15 STRL LF DISP TIS (BLADE) ×1 IMPLANT
BLADE SURG 15 STRL SS (BLADE) ×2
CANISTER SUCT 3000ML PPV (MISCELLANEOUS) ×2 IMPLANT
CANNULA ARTERIAL NVNT 3/8 22FR (MISCELLANEOUS) IMPLANT
CANNULA GUNDRY RCSP 15FR (MISCELLANEOUS) IMPLANT
CANNULA SUMP PERICARDIAL (CANNULA) ×2 IMPLANT
CATH ROBINSON RED A/P 18FR (CATHETERS) ×3 IMPLANT
CATH THORACIC 36FR (CATHETERS) ×1 IMPLANT
CLIP FOGARTY SPRING 6M (CLIP) IMPLANT
CNTNR URN SCR LID CUP LEK RST (MISCELLANEOUS) IMPLANT
CONN 1/2X1/2X1/2  BEN (MISCELLANEOUS) ×2
CONN 1/2X1/2X1/2 BEN (MISCELLANEOUS) ×1 IMPLANT
CONN 3/8X1/2 ST GISH (MISCELLANEOUS) ×4 IMPLANT
CONN ST 1/2X1/2  BEN (MISCELLANEOUS) ×2
CONN ST 1/2X1/2 BEN (MISCELLANEOUS) IMPLANT
CONN ST 3/8 X 1/2 (MISCELLANEOUS) ×1 IMPLANT
CONN Y 3/8X3/8X3/8  BEN (MISCELLANEOUS)
CONN Y 3/8X3/8X3/8 BEN (MISCELLANEOUS) IMPLANT
CONT SPEC 4OZ STRL OR WHT (MISCELLANEOUS) ×4
CONTAINER PROTECT SURGISLUSH (MISCELLANEOUS) ×4 IMPLANT
DRAIN CHANNEL 32F RND 10.7 FF (WOUND CARE) ×1 IMPLANT
DRAPE CARDIOVASCULAR INCISE (DRAPES) ×2
DRAPE SRG 135X102X78XABS (DRAPES) ×1 IMPLANT
DRAPE WARM FLUID 44X44 (DRAPES) IMPLANT
DRSG AQUACEL AG ADV 3.5X14 (GAUZE/BANDAGES/DRESSINGS) ×1 IMPLANT
ELECT CAUTERY BLADE 6.4 (BLADE) IMPLANT
ELECT REM PT RETURN 9FT ADLT (ELECTROSURGICAL) ×4
ELECTRODE REM PT RTRN 9FT ADLT (ELECTROSURGICAL) ×2 IMPLANT
FELT TEFLON 1X6 (MISCELLANEOUS) ×4 IMPLANT
GAUZE 4X4 16PLY ~~LOC~~+RFID DBL (SPONGE) ×2 IMPLANT
GAUZE SPONGE 4X4 12PLY STRL (GAUZE/BANDAGES/DRESSINGS) ×3 IMPLANT
GLOVE SURG SIGNA 7.5 PF LTX (GLOVE) IMPLANT
GOWN STRL REUS W/ TWL LRG LVL3 (GOWN DISPOSABLE) ×4 IMPLANT
GOWN STRL REUS W/TWL LRG LVL3 (GOWN DISPOSABLE) ×8
HEMOSTAT POWDER SURGIFOAM 1G (HEMOSTASIS) ×3 IMPLANT
HEMOSTAT SURGICEL 2X14 (HEMOSTASIS) ×1 IMPLANT
INSERT FOGARTY XLG (MISCELLANEOUS) IMPLANT
KIT BASIN OR (CUSTOM PROCEDURE TRAY) ×2 IMPLANT
KIT SUCTION CATH 14FR (SUCTIONS) ×2 IMPLANT
KIT TURNOVER KIT B (KITS) ×2 IMPLANT
LINE VENT (MISCELLANEOUS) ×1 IMPLANT
LOOP VESSEL SUPERMAXI WHITE (MISCELLANEOUS) ×1 IMPLANT
NS IRRIG 1000ML POUR BTL (IV SOLUTION) ×11 IMPLANT
PACK OPEN HEART (CUSTOM PROCEDURE TRAY) ×2 IMPLANT
PAD ARMBOARD 7.5X6 YLW CONV (MISCELLANEOUS) ×4 IMPLANT
POSITIONER HEAD DONUT 9IN (MISCELLANEOUS) ×2 IMPLANT
RING ANLPLS SIMUFORM 28 (Prosthesis & Implant Heart) IMPLANT
RING ANNULOPLASTY SIMUFORM 28 (Prosthesis & Implant Heart) ×2 IMPLANT
SET MPS 3-ND DEL (MISCELLANEOUS) ×1 IMPLANT
SIZER CHORD-X CHORDAL CXCS (SIZER) ×1 IMPLANT
SPONGE T-LAP 18X18 ~~LOC~~+RFID (SPONGE) ×8 IMPLANT
SPONGE T-LAP 4X18 ~~LOC~~+RFID (SPONGE) ×2 IMPLANT
SUT ETHIBOND 2 0 SH (SUTURE) ×10 IMPLANT
SUT ETHIBOND 2 0 SH 36X2 (SUTURE) ×2 IMPLANT
SUT ETHIBOND 2 0 V4 (SUTURE) ×2 IMPLANT
SUT ETHIBOND 2 0V4 GREEN (SUTURE) ×2 IMPLANT
SUT ETHIBOND 4 0 RB 1 (SUTURE) ×2 IMPLANT
SUT GORETEX CV4 TH-18 (SUTURE) ×2 IMPLANT
SUT PROLENE 3 0 SH1 36 (SUTURE) ×2 IMPLANT
SUT PROLENE 4 0 RB 1 (SUTURE) ×10
SUT PROLENE 4 0 SH DA (SUTURE) ×5 IMPLANT
SUT PROLENE 4-0 RB1 .5 CRCL 36 (SUTURE) ×2 IMPLANT
SUT PROLENE 5 0 C 1 36 (SUTURE) ×4 IMPLANT
SUT PROLENE 5 0 CC1 (SUTURE) ×2 IMPLANT
SUT PTFE CHORD X 16MM (SUTURE) ×1 IMPLANT
SUT SILK  1 MH (SUTURE) ×6
SUT SILK 1 MH (SUTURE) ×2 IMPLANT
SUT SILK 1 TIES 10X30 (SUTURE) ×2 IMPLANT
SUT SILK 2 0 (SUTURE) ×2
SUT SILK 2 0 SH CR/8 (SUTURE) ×4 IMPLANT
SUT SILK 2-0 18XBRD TIE 12 (SUTURE) ×1 IMPLANT
SUT SILK 3 0 SH CR/8 (SUTURE) ×2 IMPLANT
SUT SILK 4 0 (SUTURE) ×2
SUT SILK 4-0 18XBRD TIE 12 (SUTURE) ×1 IMPLANT
SUT STEEL 6MS V (SUTURE) ×1 IMPLANT
SUT STEEL SZ 6 DBL 3X14 BALL (SUTURE) ×1 IMPLANT
SUT TEM PAC WIRE 2 0 SH (SUTURE) ×8 IMPLANT
SUT VIC AB 1 CTX 27 (SUTURE) ×2 IMPLANT
SUT VIC AB 1 CTX 36 (SUTURE)
SUT VIC AB 1 CTX36XBRD ANBCTR (SUTURE) IMPLANT
SUT VIC AB 2-0 CTX 27 (SUTURE) IMPLANT
SUT VIC AB 3-0 X1 27 (SUTURE) IMPLANT
SYSTEM SAHARA CHEST DRAIN ATS (WOUND CARE) ×2 IMPLANT
TAPE PAPER 2X10 WHT MICROPORE (GAUZE/BANDAGES/DRESSINGS) ×1 IMPLANT
TOWEL GREEN STERILE (TOWEL DISPOSABLE) ×2 IMPLANT
TOWEL GREEN STERILE FF (TOWEL DISPOSABLE) ×2 IMPLANT
TRAY FOLEY SLVR 16FR TEMP STAT (SET/KITS/TRAYS/PACK) ×2 IMPLANT
UNDERPAD 30X36 HEAVY ABSORB (UNDERPADS AND DIAPERS) ×2 IMPLANT
WATER STERILE IRR 1000ML POUR (IV SOLUTION) ×4 IMPLANT

## 2021-06-27 NOTE — Anesthesia Procedure Notes (Signed)
Central Venous Catheter Insertion Start/End6/16/2023 8:55 AM, 06/27/2021 8:57 AM Patient location: Pre-op. Preanesthetic checklist: patient identified, IV checked, site marked, risks and benefits discussed, surgical consent, monitors and equipment checked, pre-op evaluation, timeout performed and anesthesia consent Hand hygiene performed  and maximum sterile barriers used  PA cath was placed.Swan type:thermodilution Procedure performed without using ultrasound guided technique. Attempts: 1 Patient tolerated the procedure well with no immediate complications.

## 2021-06-27 NOTE — Progress Notes (Signed)
Patient hypotensive post-operatively, quickly normalized after norepi started, albumin infused. Remained on 70% FiO2. Reduced breath sounds on the left. CXR with silhouetting L hemidiphragm, increased opacification of the left hemithorax. ETT ~ 1cm above carina. Peak pressure 33, Pplat 27. Bedside US with small dependent pleural effusion. Dr. Roxan Hockey at bedside, placed pigtail chest tube with serous output. Still reduced breath sounds, but Ppeak 27. SpO2 improved slightly.  H/H 10.9/32 INR 1.5  Post-op labs returning.  Calcium given empirically based on most recent ABG in OR Can position R lung down to facilitate postural drainage in case she has retained secretions in the left bronchus, but atelectasis is more likely.  Case discussed with RN, RT, Dr. Roxan Hockey, and NP Bowser.  Debra Hy, DO 06/27/21 4:39 PM [facility]

## 2021-06-27 NOTE — Progress Notes (Addendum)
Inpatient Rehab Admissions Coordinator:   Rescreened for CIR following extubation.  Slow progress with therapy due to lethargy.  Note to OR today for MVRepair/Replace.  Will f/u and rescreen post-op for continued therapy needs.   Shann Medal, PT, DPT Admissions Coordinator [phone] 06/27/21  2:06 PM

## 2021-06-27 NOTE — Anesthesia Procedure Notes (Signed)
Central Venous Catheter Insertion Start/End6/16/2023 8:45 AM, 06/27/2021 8:58 AM Patient location: Pre-op. Preanesthetic checklist: patient identified, IV checked, site marked, risks and benefits discussed, surgical consent, monitors and equipment checked, pre-op evaluation, timeout performed and anesthesia consent Position: Trendelenburg Lidocaine 1% used for infiltration and patient sedated Hand hygiene performed , maximum sterile barriers used  and Seldinger technique used Catheter size: 9 Fr Central line was placed.MAC introducer Swan type:thermodilation Procedure performed using ultrasound guided technique. Ultrasound Notes:anatomy identified, needle tip was noted to be adjacent to the nerve/plexus identified, no ultrasound evidence of intravascular and/or intraneural injection and image(s) printed for medical record Attempts: 1 Following insertion, line sutured, dressing applied and Biopatch. Post procedure assessment: blood return through all ports, free fluid flow and no air  Patient tolerated the procedure well with no immediate complications.

## 2021-06-27 NOTE — Progress Notes (Addendum)
STROKE TEAM PROGRESS NOTE   INTERVAL HISTORY RN at bedside.  Patient just came back from the OR status post mitral valve repair surgery.  She still paralyzed and sedated at this time.  Postop hypotension, received IV pressor and albumin.  However, patient blood pressure still very labile.  Vitals:   06/27/21 1715 06/27/21 1730 06/27/21 1745 06/27/21 1800  BP:      Pulse: 79 80 80 80  Resp: (!) 25 (!) 21 12 14   Temp: 98.1 F (36.7 C) 97.9 F (36.6 C) 97.9 F (36.6 C) 97.7 F (36.5 C)  TempSrc:      SpO2: 93% 93% 97% 98%  Weight:      Height:       CBC:  Recent Labs  Lab 06/24/21 0244 06/25/21 0324 06/26/21 0230 06/27/21 0400 06/27/21 0939 06/27/21 1315 06/27/21 1429 06/27/21 1600 06/27/21 1601 06/27/21 1753  WBC 23.2* 21.9*   < > 20.6*  --   --   --  46.7*  --   --   NEUTROABS 18.8* 19.5*  --   --   --   --   --   --   --   --   HGB 8.0* 7.0*   < > 8.3*   < > 8.0*   < > 10.9* 10.5* 9.2*  HCT 24.7* 21.8*   < > 25.9*   < > 23.5*   < > 32.0* 31.0* 27.0*  MCV 81.0 82.3   < > 83.0  --   --   --  84.7  --   --   PLT 199 254   < > 420*  --  198  --  236  --   --    < > = values in this interval not displayed.   Basic Metabolic Panel:  Recent Labs  Lab 06/23/21 0620 06/25/21 0324 06/26/21 0230 06/27/21 0400 06/27/21 0939 06/27/21 1238 06/27/21 1305 06/27/21 1429 06/27/21 1441 06/27/21 1601 06/27/21 1753  NA 143   < > 138 143   < > 143   < > 143   < > 143 146*  K 4.4   < > 3.4* 3.4*   < > 3.9   < > 4.0   < > 4.0 3.8  CL 111   < > 108 112*   < > 108  --  106  --   --   --   CO2 23   < > 23 25  --   --   --   --   --   --   --   GLUCOSE 96   < > 145* 101*   < > 135*  --  193*  --   --   --   BUN 21*   < > 17 15   < > 13  --  13  --   --   --   CREATININE 0.88   < > 0.76 0.89   < > 0.40*  --  0.50  --   --   --   CALCIUM 8.3*   < > 7.8* 8.3*  --   --   --   --   --   --   --   MG 1.8  --   --  2.3  --   --   --   --   --   --   --   PHOS 3.5  --   --  3.4  --   --    --   --   --   --   --    < > =  values in this interval not displayed.   Lipid Panel:  Recent Labs  Lab 06/23/21 0620  TRIG 157*   HgbA1c:  Recent Labs  Lab 06/27/21 0400  HGBA1C 6.1*   Urine Drug Screen:  No results for input(s): "LABOPIA", "COCAINSCRNUR", "LABBENZ", "AMPHETMU", "THCU", "LABBARB" in the last 168 hours.   Alcohol Level  No results for input(s): "ETH" in the last 168 hours.   IMAGING past 24 hours DG CHEST PORT 1 VIEW  Result Date: 06/27/2021 CLINICAL DATA:  None chest tube in place. Just mint of endotracheal tube. EXAM: PORTABLE CHEST 1 VIEW COMPARISON:  One view chest x-ray from 06/27/21 at 3:35 p.m. FINDINGS: Heart is enlarged. Swan-Ganz catheter is stable. A new left-sided chest tube is in place. Pigtail catheter projects over the left hemithorax. The left pleural effusion is slightly decreased. Right-sided chest tube is stable. No pneumothorax is present. Lung volumes have decreased bilaterally. Endotracheal tube is been pulled back. It is now in the trachea, terminating less than 2 cm from the carina. It could be pulled back 1-2 cm for more optimal positioning. IMPRESSION: 1. Interval placement of left-sided chest tube with decrease in left pleural effusion. 2. Stable right-sided chest tube without pneumothorax. 3. Endotracheal tube is been pulled back. It is now in the trachea, terminating less than 2 cm from the carina. Electronically Signed   By: San Morelle M.D.   On: 06/27/2021 17:01   DG Chest Port 1 View  Result Date: 06/27/2021 CLINICAL DATA:  Status post mitral valve repair. EXAM: PORTABLE CHEST 1 VIEW COMPARISON:  Chest radiograph 06/24/2021 FINDINGS: Sequelae of interval mitral valve repair are identified. The endotracheal tube has been advanced and now terminates in the region of the right mainstem bronchus orifice. Enteric tube courses into the abdomen with tip not imaged. A new right jugular Swan-Ganz catheter terminates over the main pulmonary  artery. A right chest tube and mediastinal drain are in place. Opacity is present throughout most of the left lung with evidence of volume loss, and new pleural fluid is present in the left lung base and tracks laterally to the apex. No pneumothorax is identified. IMPRESSION: 1. Interval advancement of endotracheal tube into the region of the right mainstem bronchus orifice. Recommend retracting 4 cm. 2. Left lung collapse with new left-sided pleural effusion or possible hemothorax. These results will be called to the ordering clinician or representative by the Radiologist Assistant, and communication documented in the PACS or [employer]. Electronically Signed   By: Logan Bores M.D.   On: 06/27/2021 16:11   EEG adult  Result Date: 06/27/2021 Lora Havens, MD     06/27/2021  8:22 AM Patient Name: Kemiya Batdorf MRN: [id number] Epilepsy Attending: Lora Havens Referring Physician/Provider: Rosalin Hawking, MD Date: 06/27/2021 Duration: 25.51 mins  Patient history:  46 year old woman with past medical history of schizophrenia, hypertension, hyperlipidemia, presenting with fever, altered mental status, and found to have multifocal strokes, with worsening mental status.  EEG to evaluate for seizure.  Level of alertness: awake  AEDs during EEG study: None  Technical aspects: This EEG study was done with scalp electrodes positioned according to the 10-20 International system of electrode placement. Electrical activity was acquired at a sampling rate of 500Hz  and reviewed with a high frequency filter of 70Hz  and a low frequency filter of 1Hz . EEG data were recorded continuously and digitally stored.  Description: EEG showed continuous generalized 3-6Hz  theta-delta slowing. Hyperventilation and photic stimulation were not performed.  ABNORMALITY - Continuous slow, generalized  IMPRESSION: This study is suggestive of moderate diffuse encephalopathy, nonspecific etiology. No seizures or epileptiform discharges  were seen throughout the recording.  Priyanka [name]    PHYSICAL EXAM  Temp:  [97.7 F (36.5 C)-100.8 F (38.2 C)] 97.7 F (36.5 C) (06/16 1800) Pulse Rate:  [74-102] 80 (06/16 1800) Resp:  [10-32] 14 (06/16 1800) BP: (122-136)/(71-87) 135/86 (06/16 0800) SpO2:  [90 %-100 %] 98 % (06/16 1800) Arterial Line BP: (62-168)/(44-104) 97/63 (06/16 1800) FiO2 (%):  [50 %-70 %] 50 % (06/16 1650) Weight:  [90.2 kg] 90.2 kg (06/16 0825)  General - Well nourished, well developed, intubated on sedation, still not off paralytic yet.  Ophthalmologic - fundi not visualized due to noncooperation.  Cardiovascular - Regular rate and rhythm.  Neuro - intubated on sedation, eyes closed, not following commands. With forced eye opening, eyes in mid position, not blinking to visual threat, doll's eyes absent, not tracking, pupil bilaterally 1.5 mm, very sluggish to light. Corneal reflex absent, gag and cough absent. Breathing not over the vent.  Facial symmetry not able to test due to ET tube.  Tongue protrusion not cooperative. On pain stimulation, no movement in all extremities. DTR diminished and no babinski. Sensation, coordination and gait not tested.   ASSESSMENT/PLAN [name] is a 46 y.o. female with history of schizophrenia, HTN and HLD presenting with AMS, fever and acute onset left gaze deviation.  She may have had seizure activity at this time, cEEG ongoing.  She was intubated for airway protection.  CT head was concerning for left sided stroke, and CTA revealed left MCA M2 branch occlusion.  MRI brain reveals large left MCA territory infarct and scattered acute infarcts in right hemisphere and left occipital lobe.  Stroke: Left MCA infarct and small scattered acute infarcts likely secondary due to embolism from bacterial endocarditis CT head Loss of gray-white matter differentiation along left insula CTA head & neck occlusion of left MCA M2 branch in insula.  No other intracranial  arterial occlusion or high grade stenosis MRI  large left MCA territory cortical infarct, scattered punctate foci of acute ischemia in right hemisphere and left occipital lobe MRI 6/15 evolving left MCA infarct, no hemorrhage transformation MRA head 6/15 persistent occlusive thrombus within the left M2 branch, similar to prior CTA 2D Echo EF 50%.  Small vegetation noted on the mitral valve.  TEE showed large mobile MV vegetation LDL direct 27.7 HgbA1c 6.5 UDS negative VTE prophylaxis - lovenox  No antithrombotic prior to admission, now on no antithrombotics given the stroke cause likely due to endocarditis. Therapy recommendations: CLR  disposition:  pending  Seizure-like activity Patient had possible seizure on 6/8 with new onset left gaze deviation and altered mental status EEG and long-term EEG moderate to severe encephalopathy, no seizure EEG d/c'd No AED needed at this time Repeat EEG 6/16 no seizure.   Endocarditis Blood culture MSSA Sepsis bacteremia on nafcillin  2D Echo Small vegetation noted on the mitral valve.  TEE showed large mobile MV vegetation CTS Dr. Koleen Nimrod on board, s/p MV repair 6/16  Left knee septic joint Status post left knee washout Surgery on board Dressing dry and clean  Acute Hypoxic Respiratory Failure Patient was initially intubated for airway protection Extubated 6/15, reintubated 6/16 for cardiac surgery Ventilator management per CCM  Postop hypotension On IV dopamine, Levophed and Neo-Synephrine On albumin Labile Avoid low BP BP goal normotensive  Hyperlipidemia Home meds:  atorvastatin 20 mg daily  LDL 27.7, goal < 70 No need of Lipitor at this time given low LDL  Anemia  Hb 8.0->7.0->6.3->PRBC->8.1-> 6.8-> PRBC CBC monitoring  Schizophrenia Patient has history of schizophrenia Had stopped taking medications about a week prior to admission and had become nonverbal May consider to consult psychiatry when patient  extubated  Other Stroke Risk Factors   Other Active Problems Leukocytosis WBC 23.2->21.9->23.6-> 20.6 Hypokalemia K 3.4-> 3.8   Hospital day # 8  Neurology follow peripherally. Please feel free to call with questions.     Rosalin Hawking, MD PhD Stroke Neurology 06/27/2021 6:04 PM    To contact Stroke Continuity provider, please refer to [email or web address] After hours, contact General Neurology

## 2021-06-27 NOTE — Progress Notes (Signed)
OT Cancellation Note  Patient Details Name: Debra Klein MRN: [id number] DOB: [birth year]   Cancelled Treatment:    Reason Eval/Treat Not Completed: Patient at procedure or test/ unavailable (Surgery. Will return as schedule allows. Thank you.)  [place], OTR/L Acute Rehab Office: [phone] 06/27/2021, 8:22 AM

## 2021-06-27 NOTE — Progress Notes (Signed)
Pt ET tube pull back from 25 measured from the lip to 23 measured from the lips per MD. Pt tolerated well, MD at bedside , RN at bedside, RT will monitor

## 2021-06-27 NOTE — Progress Notes (Signed)
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    Labs weekly while on IV antibiotics: __ CBC with differential __ CMP __ CRP __ ESR  __ Please pull PIC at completion of IV antibiotics   Fax weekly labs to [phone]  Clinic Follow Up Appt: 7/28  @ [facility] with Dr. Candiss Norse

## 2021-06-27 NOTE — Anesthesia Preprocedure Evaluation (Signed)
Anesthesia Evaluation  Patient identified by MRN, date of birth, ID band Patient awake    Reviewed: Allergy & Precautions, H&P , NPO status , Patient's Chart, lab work & pertinent test results  Airway Mallampati: II   Neck ROM: full    Dental   Pulmonary neg pulmonary ROS,    breath sounds clear to auscultation       Cardiovascular hypertension, + Valvular Problems/Murmurs MR  Rhythm:regular Rate:Normal  TEE: moderate MR with eccentric jet. LVEF normal.  There is a mobile vegetation on the posterior leaflet with concern for abscess as well.   Neuro/Psych PSYCHIATRIC DISORDERS Schizophrenia Acute right side hemiparesis. CVA    GI/Hepatic   Endo/Other    Renal/GU      Musculoskeletal  (+) Arthritis ,   Abdominal   Peds  Hematology  (+) Blood dyscrasia, anemia ,   Anesthesia Other Findings   Reproductive/Obstetrics                             Anesthesia Physical Anesthesia Plan  ASA: 4  Anesthesia Plan: General   Post-op Pain Management:    Induction: Intravenous  PONV Risk Score and Plan: 3 and Ondansetron, Dexamethasone, Midazolam and Treatment may vary due to age or medical condition  Airway Management Planned: Oral ETT  Additional Equipment: Arterial line, CVP, PA Cath, 3D TEE, Ultrasound Guidance Line Placement and TEE  Intra-op Plan:   Post-operative Plan: Post-operative intubation/ventilation  Informed Consent: I have reviewed the patients History and Physical, chart, labs and discussed the procedure including the risks, benefits and alternatives for the proposed anesthesia with the patient or authorized representative who has indicated his/her understanding and acceptance.     Dental advisory given  Plan Discussed with: CRNA, Anesthesiologist and Surgeon  Anesthesia Plan Comments:         Anesthesia Quick Evaluation

## 2021-06-27 NOTE — Progress Notes (Signed)
PT Cancellation Note  Patient Details Name: Debra Klein MRN: [id number] DOB: 1975-04-05   Cancelled Treatment:    Reason Eval/Treat Not Completed: Patient at procedure or test/unavailable this morning (at OR for mitral valve repair), will continue to follow and re-evaluate after surgery as appropriate.   [place], PT, DPT   Acute Rehabilitation Department   Sandra Cockayne 06/27/2021, 8:38 AM

## 2021-06-27 NOTE — Procedures (Signed)
Initial post op CXR showed right mainstem intubation with left lung atelectasis +/- effusion. US showed some effusion on right 63F pigtail catheter placed using sterile technique. 1%% lidocaine local, 5 ml About 200 ml of serous fluid drained CXR shows catheter in good position  [name], MD [facility] [phone]

## 2021-06-27 NOTE — Hospital Course (Signed)
Reason for Consult:endocarditis Referring Physician: Dr. Carlis Abbott     History of Present Illness:  46 yo woman with history of hypertension, hyperlipidemia, anemia and schizophrenia presented with altered mental status, general malaise, anorexia and multiple falls.  Intubated in ED. Workup revealed Left MCA infarct and septic left knee joint.  Echo showed moderate to severe MR with small vegetation and moderate TR.  Blood cultures growing methacillin sensitive staph aureus.  She was treated with IV nafcillin per ID recs and supported with the mechanical vent. Her mental status improved allowing for extubation on 06/26/21. She regained some function of the right arm after having right hemiparesis for several days. Dr. Roxan Hockey discussed mitral valve repair and possible replacement with the patient and her family and the decision was made to proceed with surgery.   Post-Operative Hospital Course: Debra Klein was taken to the OR on 06/27/21 where the mitral valve vegetation was removed and mitral valve repair was performed.  She separated from cardiopulmonary bypass without difficulty on Neo-synephrine. No inotropic support was required.   Following the procedure she was transferred to the ICU in stable condition.

## 2021-06-27 NOTE — Anesthesia Procedure Notes (Signed)
Arterial Line Insertion Start/End6/16/2023 8:15 AM, 06/27/2021 8:20 AM  Patient location: Pre-op. Preanesthetic checklist: patient identified, IV checked, site marked, risks and benefits discussed, surgical consent, monitors and equipment checked, pre-op evaluation, timeout performed and anesthesia consent Lidocaine 1% used for infiltration Left, radial was placed Catheter size: 20 G Hand hygiene performed  and maximum sterile barriers used   Attempts: 1 Procedure performed without using ultrasound guided technique. Following insertion, Biopatch and dressing applied. Post procedure assessment: normal  Patient tolerated the procedure well with no immediate complications.

## 2021-06-27 NOTE — Interval H&P Note (Signed)
History and Physical Interval Note:  No evidence of hemorrhage on MR Neuro exam- awake, follows commands with left side, r hemiparesis and aphasia persist 06/27/2021 8:41 AM  Debra Klein  has presented today for surgery, with the diagnosis of MV VEGETATION MODERATE MR.  The various methods of treatment have been discussed with the patient and family. After consideration of risks, benefits and other options for treatment, the patient has consented to  Procedure(s): MITRAL VALVE REPAIR OR REPLACEMENT (MVR) (N/A) TRANSESOPHAGEAL ECHOCARDIOGRAM (TEE) (N/A) as a surgical intervention.  The patient's history has been reviewed, patient examined, no change in status, stable for surgery.  I have reviewed the patient's chart and labs.  Questions were answered to the patient's satisfaction.     Melrose Nakayama

## 2021-06-27 NOTE — Progress Notes (Signed)
[facility] ADULT ICU REPLACEMENT PROTOCOL   The patient does apply for the [facility] Adult ICU Electrolyte Replacment Protocol based on the criteria listed below:   1.Exclusion criteria: TCTS patients, ECMO patients, and Dialysis patients 2. Is GFR >/= 30 ml/min? Yes.    Patient's GFR today is >60 3. Is SCr </= 2? Yes.   Patient's SCr is 0.89 mg/dL 4. Did SCr increase >/= 0.5 in 24 hours? No. 5.Pt's weight >40kg  Yes.   6. Abnormal electrolyte(s): K+ 3.4  7. Electrolytes replaced per protocol 8.  Call MD STAT for K+ </= 2.5, Phos </= 1, or Mag </= 1 Physician:  n/a  Darlys Gales 06/27/2021 5:49 AM

## 2021-06-27 NOTE — Procedures (Signed)
Patient Name: Debra Klein  MRN: [id number]  Epilepsy Attending: Lora Havens  Referring Physician/Provider: Rosalin Hawking, MD  Date: 06/27/2021 Duration: 25.51 mins   Patient history:  46 year old woman with past medical history of schizophrenia, hypertension, hyperlipidemia, presenting with fever, altered mental status, and found to have multifocal strokes, with worsening mental status.  EEG to evaluate for seizure.   Level of alertness: awake   AEDs during EEG study: None   Technical aspects: This EEG study was done with scalp electrodes positioned according to the 10-20 International system of electrode placement. Electrical activity was acquired at a sampling rate of 500Hz  and reviewed with a high frequency filter of 70Hz  and a low frequency filter of 1Hz . EEG data were recorded continuously and digitally stored.    Description: EEG showed continuous generalized 3-6Hz  theta-delta slowing. Hyperventilation and photic stimulation were not performed.      ABNORMALITY - Continuous slow, generalized   IMPRESSION: This study is suggestive of moderate diffuse encephalopathy, nonspecific etiology. No seizures or epileptiform discharges were seen throughout the recording.   Autrey Human [name]

## 2021-06-27 NOTE — Progress Notes (Signed)
Pt cleaned moderate amt of bowel leaking around rectal tube. CHG completed for surgery. Consent signed by mother. Pt stable denies pain. Transported to holding area report given to holding area nurse.

## 2021-06-27 NOTE — Progress Notes (Signed)
SLP Cancellation Note  Patient Details Name: Debra Klein MRN: [id number] DOB: Dec 26, 1975   Cancelled treatment:       Reason Eval/Treat Not Completed: Medical issues which prohibited therapy (NPO this am, per RN going for mitral valve repair and then transferring to [place]). Will f/u as able.    Osie Bond., M.A. [place] Office [phone]  06/27/2021, 7:48 AM

## 2021-06-27 NOTE — Progress Notes (Signed)
ET tube pulled back from 23 measured at the lips to 22 measured at the lips per MD. Recruitment maneuver done on vent. Per MD. Pt tolerated well and placed back on pervious vent settings. Spo2 100%, MD aware, RN at bedside.

## 2021-06-27 NOTE — Progress Notes (Signed)
  Echocardiogram Echocardiogram Transesophageal has been performed.  Debra Klein 06/27/2021, 10:02 AM

## 2021-06-27 NOTE — Progress Notes (Signed)
CT surgery PM rounds  Patient is status post mitral valve repair for endocarditis with MR. She is now stable after having developed pulmonary problems due to malposition of the endotracheal tube and left lung atelectasis.  Last ABG is satisfactory.  Plan is to leave the patient intubated overnight and to optimize her pulmonary status for vent wean tomorrow. Hemodynamics stable but cardiac index is suboptimal.  Low-dose dopamine has been started and Co. ox is pending.  Elevated pulmonary artery pressures are improving after repositioning ETT.  Hemoglobin 10.1  Blood pressure 135/86, pulse 79, temperature 97.7 F (36.5 C), resp. rate (!) 24, height 5\' 6"  (1.676 m), weight 90.2 kg, SpO2 99 %.

## 2021-06-27 NOTE — Transfer of Care (Signed)
Immediate Anesthesia Transfer of Care Note  Patient: Debra Klein  Procedure(s) Performed: MITRAL VALVE REPAIR USING 28MM SIMUFORM SEMI-RIGID ANNULOPLASTY RING TRANSESOPHAGEAL ECHOCARDIOGRAM (TEE)  Patient Location: SICU  Anesthesia Type:General  Level of Consciousness: Patient remains intubated per anesthesia plan  Airway & Oxygen Therapy: Patient remains intubated per anesthesia plan and Patient placed on Ventilator (see vital sign flow sheet for setting)  Post-op Assessment: Report given to RN and Post -op Vital signs reviewed and stable  Post vital signs: Reviewed and stable  Last Vitals:  Vitals Value Taken Time  BP 134/94 1524  Temp    Pulse 80 06/27/21 1549  Resp 15 06/27/21 1549  SpO2 95 % 06/27/21 1549  Vitals shown include unvalidated device data.  Last Pain:  Vitals:   06/27/21 0400  TempSrc: Axillary         Complications: No notable events documented.

## 2021-06-27 NOTE — Op Note (Unsigned)
NAMEEDMUND, Debra Klein MEDICAL RECORD NO: [id number] ACCOUNT NO: [id number] DATE OF BIRTH: [birth year] FACILITY: [facility] LOCATION: [facility] PHYSICIAN: Revonda Standard. Roxan Hockey, MD  Operative Report   DATE OF PROCEDURE: 06/27/2021  PREOPERATIVE DIAGNOSES:  Mitral endocarditis with moderate to severe mitral regurgitation, status post stroke.  POSTOPERATIVE DIAGNOSES:  Mitral endocarditis with moderate to severe mitral regurgitation, status post stroke.  PROCEDURE:  Median sternotomy, extracorporeal circulation, mitral valve repair with posterior leaflet resection and pericardial patch, 16 mm Gore-Tex cords, and 28 mm Medtronic SimuForm annuloplasty ring.  SURGEON:  Revonda Standard. Roxan Hockey, MD  ASSISTANT:  Enid Cutter.  ANESTHESIA:  General.  FINDINGS:  Large vegetation involving P2 and part of the P1 leaflet. No annular abscess. Mild to moderate residual MR post-reconstruction.  CLINICAL NOTE:  The patient is a 46 year old woman who presented with a left sided stroke with right hemiparesis and aphasia.  She was found to have embolic strokes secondary to mitral valve endocarditis.  Transesophageal echocardiography showed a large  mobile vegetation of the posterior leaflet, measuring approximately 1.3-1.5 cm in diameter.  Because of the risk of recurrent embolization, her family was advised and wished to proceed with mitral valve repair or replacement depending on intraoperative  findings.  The indications, risks, benefits, and alternatives were discussed in detail with the family.  They understood the high risk nature of the procedure and also understood that this would not in any way improve her neurologic status, but hopefully  would prevent further events.  They accepted the risks and agreed to proceed.  OPERATIVE NOTE:  The patient was brought to the preoperative holding area on 06/27/2021.  Anesthesia placed a Swan-Ganz catheter and an arterial blood pressure monitoring line.  She was taken to  the operating room and anesthetized and intubated.  A Foley  catheter was placed.  She was already on intravenous nafcillin, vancomycin and cefazolin were administered as well.  Foley catheter was placed.  The chest, abdomen and legs were prepped and draped in the usual sterile fashion.  Transesophageal  echocardiography was performed by Dr. Marcie Bal of Anesthesia.  There was moderate to severe mitral regurgitation and a large vegetation of the posterior leaflet.  There was preserved left ventricular function.  A timeout was performed. A median sternotomy was performed.  Initial hemostasis was achieved.  Sternal retractor was placed and was opened gradually. The pericardium was opened.  There was moderate pericardial effusion.  This was serous in nature.  The  patient was fully heparinized.  After confirming adequate anticoagulation with ACT measurement, the aorta was cannulated via concentric 2-0 Ethibond pledgeted pursestring sutures.  A 28-French metal-tipped venous cannula was placed via pursestring suture  in the superior vena cava.  Cardiopulmonary bypass was initiated.  A 36-French malleable cannula then was placed in the inferior aspect of the right atrium and directed into the inferior vena cava.  It was connected to the bypass circuit.  Flows were  maintained per protocol.  The patient was cooled 32 degrees Celsius.  Caval tapes were placed.  A foam pad was placed in the pericardium to insulate the heart.  A temperature probe was placed in the myocardial septum. A retrograde cardioplegia cannula  was placed via a pursestring suture in the right atrium and directed into the coronary sinus.  An antegrade cardioplegia cannula was placed in the ascending aorta.  Carbon dioxide was insufflated into the operative field.  The aorta was crossclamped.  Cardiac arrest then was achieved with a combination of cold antegrade and  retrograde KBC blood cardioplegia and topical iced saline. 350 mL of cardioplegia  was  administered antegrade initially and there was a rapid diastolic arrest.  An additional liter of cardioplegia was administered retrograde and there was septal cooling to 11 degrees Celsius.  The interatrial groove was dissected out and a left atriotomy was performed.  Retractor was positioned, exposing the mitral valve.  There was a large vegetation on the posterior leaflet involving P2 at the junction with P1. The annulus was intact.  The  infected tissue was excised with a minimal margin of normal leaflet. Part of the vegetation was sent for culture, the remainder was sent for permanent pathology.  This was a quadrangular resection, again did not involve the annulus.  The anterior leaflet  sized between a 26 and 28 mm annuloplasty ring.  It was felt that a 26 would be a very small valve was size for a woman with her body surface area and a 28 mm ring was selected.  2-0 Ethibond horizontal mattress sutures were placed circumferentially  around the annulus, total of 14 sutures were utilized.  A piece of the patient's pericardium had been treated with glutaraldehyde per protocol.  It was cut to fit the defect in the posterior leaflet and sewn into the area with running 4-0 Ethibond  sutures.  The cords on either side of the defect sized for a 16 mm Gore-Tex neochords.  Neochord was placed and attached to the patch at 3 sites along the free edge of the valve.  The left ventricle was distended with saline.  There was some residual  leak and 4-0 Ethibond sutures were used to close small clefts in the valve.  The annular sutures were placed through the sewing ring of the annuloplasty ring, which was lowered into place and the sutures were sequentially tied. Again with distention of  the left ventricle there was still some residual leak primarily at the P2-P3 patch valve interface. Additional 4-0 Ethibond sutures were placed at that area.  There was only minimal residual leakage.  Systemic rewarming was  begun.  The left atriotomy was  closed in 2 layers.  There was a running 4-0 Prolene horizontal mattress suture followed by running 4-0 Prolene simple suture.  A warm dose of retrograde cardioplegia was administered.  This was a 400 mL reanimation dose.  Once this was completed, the  patient was placed in Trendelenburg position and the aortic crossclamp was removed.  Total crossclamp time was 139 minutes.  The patient spontaneously resumed a bradycardic rhythm.  She did not require defibrillation.  Rewarming took a prolonged period of time and there were some issues with the temperature probe at Foley catheter. As the patient was being rewarmed, a trial wean was performed.  There was some residual mitral regurgitation, but this was felt to be  acceptable result.  The superior vena cava cannula was repositioned into the right atrium.  The inferior vena cava cannula was removed.  The retrograde cardioplegia cannula was removed.  The antegrade cardioplegia cannula was left in place as a vent.  When the patient had finally rewarmed to a core temperature of 37 degrees Celsius, she was weaned from cardiopulmonary bypass. She was on a Neo-Synephrine drip, but did not require inotropic support.  Total bypass time was 205 minutes.  She was paced for  rate at the time of separation from bypass.  Post-bypass transesophageal echocardiography showed preserved left ventricular function.  There was some residual mild to at most moderate  mitral regurgitation.  A test dose of protamine was administered and  was well tolerated.  The superior vena cava cannula was removed as was the aortic cannula and the aortic root vent. Remainder of the protamine was administered without incident.  The chest was irrigated with warm saline.  Hemostasis was achieved. Right  pleural and mediastinal chest tubes were placed through separate subcostal incisions.  The pericardium was not closed.  The sternum was closed with a combination of  single and double heavy gauge stainless steel wires.  The patient tolerated the sternal  closure well hemodynamically.  The pectoralis fascia, subcutaneous tissue and skin were closed in standard fashion.  All sponge, needle and instrument counts were correct at the end of the procedure.  The patient was taken from the operating room to the  surgical intensive care unit intubated and in critical but stable condition.  All sponge, needle and instrument counts were correct at the end of the procedure.  Experienced assistance was necessary for this case due to surgical complexity.  Enid Cutter, PA served as the assistant providing exposure, retraction of delicate tissues, suture management, and suctioning.   VAI D: 06/27/2021 5:18:06 pm T: 06/27/2021 9:15:00 pm  JOB: [id number]/ [id number]

## 2021-06-27 NOTE — Anesthesia Procedure Notes (Signed)
Procedure Name: Intubation Date/Time: 06/27/2021 9:26 AM  Performed by: Lowella Dell, CRNAPre-anesthesia Checklist: Patient identified, Emergency Drugs available, Suction available and Patient being monitored Patient Re-evaluated:Patient Re-evaluated prior to induction Oxygen Delivery Method: Circle System Utilized Preoxygenation: Pre-oxygenation with 100% oxygen Induction Type: IV induction Ventilation: Mask ventilation without difficulty and Oral airway inserted - appropriate to patient size Laryngoscope Size: Mac and 3 Grade View: Grade II Tube type: Oral Tube size: 7.5 mm Number of attempts: 1 Airway Equipment and Method: Stylet and Oral airway Placement Confirmation: ETT inserted through vocal cords under direct vision, positive ETCO2 and breath sounds checked- equal and bilateral Secured at: 23 cm Tube secured with: Tape Dental Injury: Teeth and Oropharynx as per pre-operative assessment

## 2021-06-27 NOTE — Brief Op Note (Addendum)
06/19/2021 - 06/27/2021  1:37 PM  PATIENT:  Debra Klein  46 y.o. female  PRE-OPERATIVE DIAGNOSIS:  MITRAL VALVE ENDOCARDITIS,  MODERATE MITRAL INSUFFICIENCY  POST-OPERATIVE DIAGNOSIS:  MITRAL VALVE ENDOCARDITIS,  MODERATE MITRAL INSUFFICIENCY  PROCEDURE:   MITRAL VALVE REPAIR USING 28MM SIMUFORM SEMI-RIGID ANNULOPLASTY RING, POSTERIOR LEAFLET RECONSTRUCTION WITH AUTOLOGOUS PERICARDIAL PATCH AND 16 MM POSTERIOR PTFE NEOCHORDS   TRANSESOPHAGEAL ECHOCARDIOGRAM   SURGEON: Melrose Nakayama, MD   PHYSICIAN ASSISTANT: Enid Cutter, PA  ASSISTANTS: Dineen Kid, RN, RN First Assistant   ANESTHESIA:   general  EBL:  810 ml   BLOOD ADMINISTERED: PRBC's x 3 units  DRAINS:  right pleural and mediastinal drains    LOCAL MEDICATIONS USED:  NONE  SPECIMEN:  mitral valve vegetation and portion of posterior leaflet  DISPOSITION OF SPECIMEN:   pathology and microbiology   COUNTS:  YES  DICTATION: .Dragon Dictation  PLAN OF CARE: Admit to inpatient   PATIENT DISPOSITION:  ICU - intubated and hemodynamically stable.   Delay start of Pharmacological VTE agent (>24hrs) due to surgical blood loss or risk of bleeding: yes

## 2021-06-27 NOTE — Progress Notes (Signed)
PHARMACY CONSULT NOTE FOR:  OUTPATIENT  PARENTERAL ANTIBIOTIC THERAPY (OPAT)  Indication: MSSA bacteremia/endocarditis  Regimen: Naficillin 12 gm every 24 hours as a continuous infusion  End date: 08/08/21   IV antibiotic discharge orders are pended. To discharging provider:  please sign these orders via discharge navigator,  Select New Orders & click on the button choice - Manage This Unsigned Work.     Thank you for allowing pharmacy to be a part of this patient's care.  Jimmy Footman, PharmD, BCPS, BCIDP Infectious Diseases Clinical Pharmacist Phone: [phone] 06/27/2021, 1:21 PM

## 2021-06-27 NOTE — Progress Notes (Signed)
EEG complete - results pending 

## 2021-06-27 NOTE — Progress Notes (Signed)
NAME:  Debra Klein, MRN:  [id number], DOB:  1975-11-12, LOS: 8 ADMISSION DATE:  06/19/2021, CONSULTATION DATE:  06/19/21 REFERRING MD:  Laurena Spies, CHIEF COMPLAINT:  respiratory failure   History of Present Illness:  Ms. Sem is a 46 year old woman with a history of schizophrenia who presented to the hospital today with acute encephalopathy. History obtained through chart review:   Family reports that about 3 weeks ago the patient started to be less verbal, but was still managing her medications. They were noticing for the last few days they would find a pill or 2 dropped on the floor although she was still overall taking her medications.  Her verbal output was actually improving in the last week or so, but then acutely today she was completely nonverbal and so they came to the ED for further evaluation.  They report she did not have any other complaints and did not notice any fevers or any physical issues other than left leg swelling and pain   Per chart review, she stopped taking her medications approximately 1 week ago, had a fall on 06/15/2021 with left ankle swelling, became nonverbal on 6/6, and subsequently had another fall on 6/7 found to have a left lower leg fracture and discharged with a cam walker boot after radiographs of her left ankle and tibia/fibula revealed previous intramedullary rodding and rod removal of the left tibia without clear evidence of acute fracture.  Subsequently she fell again on 6/8 and was found to be febrile (101.2), tachycardic to 110s, noted to be nonverbal, not following commands, frequently dozing off but withdrawing in all 4 extremities without a facial droop.  She was started on vancomycin, ceftriaxone, acyclovir for meningitis coverage.  Additionally, per hospitalist admission note LP was attempted but unsuccessful at bedside.  Subsequently at approximately 9 PM the patient had a change in mental status (drooling) with gaze deviation to the left and was  intubated. PCCM consulted. Head CT demonstrating an insular hypodensity concerning for MCA infarct.   Pertinent  Medical History  Schizophrenia HTN anemia  Significant Hospital Events: Including procedures, antibiotic start and stop dates in addition to other pertinent events   6/7: Fall --> fibula fracture, Minimally verbal at an outpatient visit with [facility]  6/8: Presented to the ED nonverbal with altered mentation. Progressively worsening mentation with requirement of intubation for airway protection.  Empirically treated for sepsis, presumed meningitis. MRI with large MCA infarct and punctate right hemisphere infarcts.  6/9: Blood cultures obtained on arrival positive for MSSA  6/10 MRI knee demonstrates septic arthritis.  6/13 L knee arthroscopy with I&D with snyovectomy for septic arthritis, TEE 6/14 steroids for no cuff leak 6/15 extubated, much more alert 6/16 mitral valve repair vs replacement  Interim History / Subjective:  Remains more alert today, mother at bedside. BM this morning.  Objective   Blood pressure 134/83, pulse 81, temperature 99.7 F (37.6 C), temperature source Axillary, resp. rate (!) 24, height 5\' 6"  (1.676 m), weight 90.2 kg, SpO2 96 %.    Vent Mode: Stand-by   Intake/Output Summary (Last 24 hours) at 06/27/2021 0817 Last data filed at 06/27/2021 0700 Gross per 24 hour  Intake 1822.94 ml  Output 2950 ml  Net -1127.06 ml    Filed Weights   06/24/21 0500 06/25/21 0213 06/26/21 0220  Weight: 87.8 kg 89 kg 90.2 kg   Examination: General: middle aged woman sitting up in bed listening to music on her phone HENT: [place]/AT, eyes anicteric Lungs: breathing comfortably on [place],  CTAB Cardiovascular: S1S2, RRR, apical systolic murmur Abdomen: soft, NT, ND Extremities: persistent peripheral edema, no cyanosis Neuro:  remains very alert, tracking, still not answering questions, moving left side Derm: no rashes, warm & dry  K+ 3.4 BUN 15 Cr 0.89 WBC  20.6 H/H 8.3/25.9 Platelets 420 BG 110-140s Blood cultures 6/10> NG, final Synovial fluid> MSSA Resp culture> few candida albicans MRSA nares now positive  MRI-- no hemorrhagic conversion, evolution of previously documented strokes  Assessment & Plan:  Acute respiratory failure with hypoxia; extubated successfully 6/15, no concerns for airway protection post-extubation.  -she was treated with steroids for lack of cuff leak prior to extubation, but no issues with stridor -ok for rapid wean after cardiac surgery based on success with previous weaning -VAP prevention protocol -pulmonary hygiene  Cerebrovascular accident due to embolic occlusion of left middle cerebral artery - secondary to septic embolization from MSSA endocarditis -con't nafcillin -PT, OT, SLP -con't statin, no need for aspirin  Sepsis due to MSSA bacteremia, L knee septic arthritis, native MV endocarditis Presume persistent fevers are attributable to endocarditis Worse leukocytosis today may be related to steroids -Appreciate Ortho & TCTS's management for source control. Planning for mitral valve surgery today. -Appreciate ID's management.  AKI due to sepsis from MSSA bacteremia, resolved -con't monitoring -strict I/Os; can hold on diuresis pre-operatively -renally dose meds, avoid nephrotoxic meds  Schizophrenia, prominent negative symptoms versus stroke symptoms -Con't PTA fluphenazine and clozapine  Acute metabolic encephalopathy due to sepsis and stroke, may also have prominent negative symptoms from schizophrenia-- significantly improved alertness since 6/15 -delirium precautions, frequent reorientation. -con't clozapine and fluphenazine for schizophrenia -neuroprotective measures  Hypokalemia -repletion ordered -con't to monitor -con't to monitor Mg+  Anemia due to critical illness -transfuse for Hb<7 or hemodynamically significant bleeding -monitor for signs of bleeding  Hyperglycemia and  prediabetes- controlled -SSI PRN -will be on insulin gtt post-op -Goal BG <180  At risk for malnutrition; has been tolerating TF well her entire admission -TF held today, can resume post-op as tolerated -con't SLP  Anasarca -needs more diuresis post-op  Ms. Lederman's mother was updated at bedside during rounds.  Best Practice (right click and "Reselect all SmartList Selections" daily)   Diet/type: tubefeeds DVT prophylaxis: SCD GI prophylaxis: PPI Lines: N/A Foley:  N/A Code Status:  full code Last date of multidisciplinary goals of care discussion [Mother and cousin updated at bedside on 6/14]   Julian Hy, DO 06/27/21 8:40 AM [place]

## 2021-06-28 ENCOUNTER — Inpatient Hospital Stay (HOSPITAL_COMMUNITY): Payer: Medicare Other

## 2021-06-28 DIAGNOSIS — G9340 Encephalopathy, unspecified: Secondary | ICD-10-CM | POA: Diagnosis not present

## 2021-06-28 DIAGNOSIS — I63412 Cerebral infarction due to embolism of left middle cerebral artery: Secondary | ICD-10-CM | POA: Diagnosis not present

## 2021-06-28 DIAGNOSIS — N179 Acute kidney failure, unspecified: Secondary | ICD-10-CM | POA: Diagnosis not present

## 2021-06-28 DIAGNOSIS — J9601 Acute respiratory failure with hypoxia: Secondary | ICD-10-CM | POA: Diagnosis not present

## 2021-06-28 DIAGNOSIS — R570 Cardiogenic shock: Secondary | ICD-10-CM

## 2021-06-28 DIAGNOSIS — L8990 Pressure ulcer of unspecified site, unspecified stage: Secondary | ICD-10-CM | POA: Insufficient documentation

## 2021-06-28 LAB — CBC
HCT: 32 % — ABNORMAL LOW (ref 36.0–46.0)
HCT: 28.1 % — ABNORMAL LOW (ref 36.0–46.0)
HCT: 28.3 % — ABNORMAL LOW (ref 36.0–46.0)
Hemoglobin: 10.9 g/dL — ABNORMAL LOW (ref 12.0–15.0)
Hemoglobin: 9.4 g/dL — ABNORMAL LOW (ref 12.0–15.0)
Hemoglobin: 9.8 g/dL — ABNORMAL LOW (ref 12.0–15.0)
MCH: 28.8 pg (ref 26.0–34.0)
MCH: 28.1 pg (ref 26.0–34.0)
MCH: 29 pg (ref 26.0–34.0)
MCHC: 34.1 g/dL (ref 30.0–36.0)
MCHC: 33.5 g/dL (ref 30.0–36.0)
MCHC: 34.6 g/dL (ref 30.0–36.0)
MCV: 84.7 fL (ref 80.0–100.0)
MCV: 84.1 fL (ref 80.0–100.0)
MCV: 83.7 fL (ref 80.0–100.0)
Platelets: 236 10*3/uL (ref 150–400)
Platelets: 229 10*3/uL (ref 150–400)
Platelets: 236 10*3/uL (ref 150–400)
RBC: 3.78 MIL/uL — ABNORMAL LOW (ref 3.87–5.11)
RBC: 3.34 MIL/uL — ABNORMAL LOW (ref 3.87–5.11)
RBC: 3.38 MIL/uL — ABNORMAL LOW (ref 3.87–5.11)
RDW: 16.6 % — ABNORMAL HIGH (ref 11.5–15.5)
RDW: 17.1 % — ABNORMAL HIGH (ref 11.5–15.5)
RDW: 17.6 % — ABNORMAL HIGH (ref 11.5–15.5)
WBC: 46.7 10*3/uL — ABNORMAL HIGH (ref 4.0–10.5)
WBC: 26.9 10*3/uL — ABNORMAL HIGH (ref 4.0–10.5)
WBC: 27.6 10*3/uL — ABNORMAL HIGH (ref 4.0–10.5)
nRBC: 0 % (ref 0.0–0.2)
nRBC: 0 % (ref 0.0–0.2)
nRBC: 0 % (ref 0.0–0.2)

## 2021-06-28 LAB — COOXEMETRY PANEL
Carboxyhemoglobin: 1 % (ref 0.5–1.5)
Carboxyhemoglobin: 1.1 % (ref 0.5–1.5)
Methemoglobin: <0.7 % (ref 0.0–1.5)
Methemoglobin: <0.7 % (ref 0.0–1.5)
O2 Saturation: 75.2 %
O2 Saturation: 74.2 %
Total hemoglobin: 9.6 g/dL — ABNORMAL LOW (ref 12.0–16.0)
Total hemoglobin: 8.6 g/dL — ABNORMAL LOW (ref 12.0–16.0)

## 2021-06-28 LAB — POCT I-STAT 7, (LYTES, BLD GAS, ICA,H+H)
Acid-base deficit: 3 mmol/L — ABNORMAL HIGH (ref 0.0–2.0)
Acid-base deficit: 3 mmol/L — ABNORMAL HIGH (ref 0.0–2.0)
Bicarbonate: 22.6 mmol/L (ref 20.0–28.0)
Bicarbonate: 22.8 mmol/L (ref 20.0–28.0)
Calcium, Ion: 1.23 mmol/L (ref 1.15–1.40)
Calcium, Ion: 1.25 mmol/L (ref 1.15–1.40)
HCT: 28 % — ABNORMAL LOW (ref 36.0–46.0)
HCT: 28 % — ABNORMAL LOW (ref 36.0–46.0)
Hemoglobin: 9.5 g/dL — ABNORMAL LOW (ref 12.0–15.0)
Hemoglobin: 9.5 g/dL — ABNORMAL LOW (ref 12.0–15.0)
O2 Saturation: 100 %
O2 Saturation: 99 %
Patient temperature: 36
Patient temperature: 36.2
Potassium: 3.4 mmol/L — ABNORMAL LOW (ref 3.5–5.1)
Potassium: 3.9 mmol/L (ref 3.5–5.1)
Sodium: 144 mmol/L (ref 135–145)
Sodium: 144 mmol/L (ref 135–145)
TCO2: 24 mmol/L (ref 22–32)
TCO2: 24 mmol/L (ref 22–32)
pCO2 arterial: 40.8 mmHg (ref 32–48)
pCO2 arterial: 41.4 mmHg (ref 32–48)
pH, Arterial: 7.341 — ABNORMAL LOW (ref 7.35–7.45)
pH, Arterial: 7.351 (ref 7.35–7.45)
pO2, Arterial: 170 mmHg — ABNORMAL HIGH (ref 83–108)
pO2, Arterial: 198 mmHg — ABNORMAL HIGH (ref 83–108)

## 2021-06-28 LAB — GLUCOSE, CAPILLARY
Glucose-Capillary: 136 mg/dL — ABNORMAL HIGH (ref 70–99)
Glucose-Capillary: 63 mg/dL — ABNORMAL LOW (ref 70–99)
Glucose-Capillary: 198 mg/dL — ABNORMAL HIGH (ref 70–99)
Glucose-Capillary: 183 mg/dL — ABNORMAL HIGH (ref 70–99)
Glucose-Capillary: 153 mg/dL — ABNORMAL HIGH (ref 70–99)
Glucose-Capillary: 127 mg/dL — ABNORMAL HIGH (ref 70–99)
Glucose-Capillary: 134 mg/dL — ABNORMAL HIGH (ref 70–99)
Glucose-Capillary: 116 mg/dL — ABNORMAL HIGH (ref 70–99)
Glucose-Capillary: 142 mg/dL — ABNORMAL HIGH (ref 70–99)
Glucose-Capillary: 138 mg/dL — ABNORMAL HIGH (ref 70–99)
Glucose-Capillary: 165 mg/dL — ABNORMAL HIGH (ref 70–99)
Glucose-Capillary: 151 mg/dL — ABNORMAL HIGH (ref 70–99)
Glucose-Capillary: 128 mg/dL — ABNORMAL HIGH (ref 70–99)
Glucose-Capillary: 128 mg/dL — ABNORMAL HIGH (ref 70–99)
Glucose-Capillary: 160 mg/dL — ABNORMAL HIGH (ref 70–99)

## 2021-06-28 LAB — BASIC METABOLIC PANEL
Anion gap: 9 (ref 5–15)
Anion gap: 7 (ref 5–15)
BUN: 14 mg/dL (ref 6–20)
BUN: 13 mg/dL (ref 6–20)
CO2: 21 mmol/L — ABNORMAL LOW (ref 22–32)
CO2: 22 mmol/L (ref 22–32)
Calcium: 8 mg/dL — ABNORMAL LOW (ref 8.9–10.3)
Calcium: 8.2 mg/dL — ABNORMAL LOW (ref 8.9–10.3)
Chloride: 109 mmol/L (ref 98–111)
Chloride: 113 mmol/L — ABNORMAL HIGH (ref 98–111)
Creatinine, Ser: 0.78 mg/dL (ref 0.44–1.00)
Creatinine, Ser: 0.81 mg/dL (ref 0.44–1.00)
GFR, Estimated: >60 mL/min (ref >60)
GFR, Estimated: >60 mL/min (ref >60)
Glucose, Bld: 204 mg/dL — ABNORMAL HIGH (ref 70–99)
Glucose, Bld: 135 mg/dL — ABNORMAL HIGH (ref 70–99)
Potassium: 3.4 mmol/L — ABNORMAL LOW (ref 3.5–5.1)
Potassium: 3 mmol/L — ABNORMAL LOW (ref 3.5–5.1)
Sodium: 139 mmol/L (ref 135–145)
Sodium: 142 mmol/L (ref 135–145)

## 2021-06-28 LAB — ACID FAST SMEAR (AFB, MYCOBACTERIA): Acid Fast Smear: NEGATIVE

## 2021-06-28 LAB — MAGNESIUM
Magnesium: 2.7 mg/dL — ABNORMAL HIGH (ref 1.7–2.4)
Magnesium: 2.2 mg/dL (ref 1.7–2.4)

## 2021-06-28 MED ORDER — SODIUM CHLORIDE 0.9% FLUSH
10.0000 mL | Freq: Two times a day (BID) | INTRAVENOUS | Status: DC
  Administered 2021-06-29 – 2021-07-08 (×13): 10 mL

## 2021-06-28 MED ORDER — CHLORHEXIDINE GLUCONATE 0.12 % MT SOLN
15.0000 mL | Freq: Once | OROMUCOSAL | Status: AC
  Administered 2021-06-28: 15 mL via OROMUCOSAL

## 2021-06-28 MED ORDER — SODIUM CHLORIDE 0.9% FLUSH: 10.0000 mL | INTRAVENOUS | Status: DC | PRN

## 2021-06-28 MED ORDER — ORAL CARE MOUTH RINSE: 15.0000 mL | OROMUCOSAL | Status: DC | PRN

## 2021-06-28 MED ORDER — FUROSEMIDE 10 MG/ML IJ SOLN
20.0000 mg | Freq: Two times a day (BID) | INTRAMUSCULAR | Status: DC
  Administered 2021-06-28 – 2021-06-29 (×2): 20 mg via INTRAVENOUS
  Filled 2021-06-28 (×2): qty 2

## 2021-06-28 MED ORDER — INSULIN ASPART 100 UNIT/ML IJ SOLN
0.0000 [IU] | INTRAMUSCULAR | Status: DC
  Administered 2021-06-29 (×4): 2 [IU] via SUBCUTANEOUS

## 2021-06-28 MED ORDER — ORAL CARE MOUTH RINSE
15.0000 mL | OROMUCOSAL | Status: DC
  Administered 2021-06-28 – 2021-06-30 (×25): 15 mL via OROMUCOSAL

## 2021-06-28 MED ORDER — POTASSIUM CHLORIDE 10 MEQ/50ML IV SOLN
10.0000 meq | INTRAVENOUS | Status: AC
  Administered 2021-06-28 (×3): 10 meq via INTRAVENOUS
  Filled 2021-06-28 (×3): qty 50

## 2021-06-28 MED ORDER — INSULIN ASPART 100 UNIT/ML IJ SOLN
0.0000 [IU] | INTRAMUSCULAR | Status: DC
  Administered 2021-06-28: 2 [IU] via SUBCUTANEOUS

## 2021-06-28 MED ORDER — POTASSIUM CHLORIDE 10 MEQ/50ML IV SOLN
10.0000 meq | INTRAVENOUS | Status: AC
  Administered 2021-06-28 (×3): 10 meq via INTRAVENOUS
  Filled 2021-06-28: qty 50

## 2021-06-28 NOTE — Progress Notes (Signed)
1 Day Post-Op Procedure(s) (LRB): MITRAL VALVE REPAIR USING 28MM SIMUFORM SEMI-RIGID ANNULOPLASTY RING (N/A) TRANSESOPHAGEAL ECHOCARDIOGRAM (TEE) (N/A) Subjective: When precedex turned down neuro status back to preop baseline Hemodynamics improved with milrinone and dopamine Extubation planned for tomorrow by CCM- CXR today with interstital edema so will dose lasix   Objective: Vital signs in last 24 hours: Temp:  [96.4 F (35.8 C)-98.4 F (36.9 C)] 98.1 F (36.7 C) (06/17 1147) Pulse Rate:  [55-93] 89 (06/17 1147) Cardiac Rhythm: Normal sinus rhythm (06/17 0400) Resp:  [10-27] 22 (06/17 1147) BP: (92-152)/(44-104) 95/44 (06/17 1147) SpO2:  [90 %-100 %] 98 % (06/17 1147) Arterial Line BP: (62-197)/(44-104) 99/49 (06/17 1115) FiO2 (%):  [40 %-70 %] 40 % (06/17 1147) Weight:  [98.1 kg] 98.1 kg (06/17 0617)  Hemodynamic parameters for last 24 hours: PAP: (31-59)/(10-34) 38/17 CVP:  [9 mmHg-40 mmHg] 11 mmHg PCWP:  [23 mmHg] 23 mmHg CO:  [2.4 L/min-4.2 L/min] 4.1 L/min CI:  [1.2 L/min/m2-2.1 L/min/m2] 2 L/min/m2  Intake/Output from previous day: 06/16 0701 - 06/17 0700 In: 7081.5 [I.V.:3835; Blood:585; IV Piggyback:2611.5] Out: 4680 [Urine:2110; Emesis/NG output:100; Stool:300; Blood:810; Chest Tube:630] Intake/Output this shift: Total I/O In: 88.4 [I.V.:65.6; IV Piggyback:22.9] Out: -   EXAM  Sedated on vent A-paced l ungs fairly clear + pedal pulses R>L  Lab Results: Recent Labs    06/27/21 2106 06/27/21 2338 06/28/21 0300 06/28/21 0401  WBC 34.3*  --  26.9*  --   HGB 10.3*   < > 9.4* 9.5*  HCT 29.5*   < > 28.1* 28.0*  PLT 231  --  229  --    < > = values in this interval not displayed.   BMET:  Recent Labs    06/27/21 2106 06/27/21 2338 06/28/21 0300 06/28/21 0401  NA 142   < > 139 144  K 3.8   < > 3.4* 3.4*  CL 112*  --  109  --   CO2 22  --  21*  --   GLUCOSE 123*  --  204*  --   BUN 14  --  14  --   CREATININE 0.80  --  0.78  --   CALCIUM 8.1*   --  8.0*  --    < > = values in this interval not displayed.    PT/INR:  Recent Labs    06/27/21 1600  LABPROT 18.1*  INR 1.5*   ABG    Component Value Date/Time   PHART 7.351 06/28/2021 0401   HCO3 22.8 06/28/2021 0401   TCO2 24 06/28/2021 0401   ACIDBASEDEF 3.0 (H) 06/28/2021 0401   O2SAT 99 06/28/2021 0401   CBG (last 3)  Recent Labs    06/28/21 0612 06/28/21 0704 06/28/21 0811  GLUCAP 127* 134* 116*    Assessment/Plan: S/P Procedure(s) (LRB): MITRAL VALVE REPAIR USING 28MM SIMUFORM SEMI-RIGID ANNULOPLASTY RING (N/A) TRANSESOPHAGEAL ECHOCARDIOGRAM (TEE) (N/A) Diuresis Diabetes control Rest on vent today , leave L chest tube and mediastinal drains   LOS: 9 days    Debra Klein 06/28/2021

## 2021-06-28 NOTE — Progress Notes (Signed)
[facility] Progress Note Patient Name: Debra Klein DOB: Aug 18, 1975 MRN: [id number]   Date of Service  06/28/2021  HPI/Events of Note  Patient with sub-optimal sedation on 0.7 mcg of Precedex, despite receiving PRN iv doses of Versed and fentanyl. BP is elevated.  eICU Interventions  Precedex gtt ceiling increased to 1.0.        [name] 06/28/2021, 1:10 AM

## 2021-06-28 NOTE — Progress Notes (Signed)
NAME:  Debra Klein, MRN:  [id number], DOB:  08-09-1975, LOS: 9 ADMISSION DATE:  06/19/2021, CONSULTATION DATE:  06/19/21 REFERRING MD:  Debra Klein, CHIEF COMPLAINT:  respiratory failure   History of Present Illness:  Debra Klein is a 46 year old woman with a history of schizophrenia who presented to the hospital today with acute encephalopathy. History obtained through chart review:   Family reports that about 3 weeks ago the patient started to be less verbal, but was still managing her medications. They were noticing for the last few days they would find a pill or 2 dropped on the floor although she was still overall taking her medications.  Her verbal output was actually improving in the last week or so, but then acutely today she was completely nonverbal and so they came to the ED for further evaluation.  They report she did not have any other complaints and did not notice any fevers or any physical issues other than left leg swelling and pain   Per chart review, she stopped taking her medications approximately 1 week ago, had a fall on 06/15/2021 with left ankle swelling, became nonverbal on 6/6, and subsequently had another fall on 6/7 found to have a left lower leg fracture and discharged with a cam walker boot after radiographs of her left ankle and tibia/fibula revealed previous intramedullary rodding and rod removal of the left tibia without clear evidence of acute fracture.  Subsequently she fell again on 6/8 and was found to be febrile (101.2), tachycardic to 110s, noted to be nonverbal, not following commands, frequently dozing off but withdrawing in all 4 extremities without a facial droop.  She was started on vancomycin, ceftriaxone, acyclovir for meningitis coverage.  Additionally, per hospitalist admission note LP was attempted but unsuccessful at bedside.  Subsequently at approximately 9 PM the patient had a change in mental status (drooling) with gaze deviation to the left and was  intubated. PCCM consulted. Head CT demonstrating an insular hypodensity concerning for MCA infarct.   Pertinent  Medical History  Schizophrenia HTN anemia  Significant Hospital Events: Including procedures, antibiotic start and stop dates in addition to other pertinent events   6/7: Fall --> fibula fracture, Minimally verbal at an outpatient visit with [facility]  6/8: Presented to the ED nonverbal with altered mentation. Progressively worsening mentation with requirement of intubation for airway protection.  Empirically treated for sepsis, presumed meningitis. MRI with large MCA infarct and punctate right hemisphere infarcts.  6/9: Blood cultures obtained on arrival positive for MSSA  6/10 MRI knee demonstrates septic arthritis.  6/13 L knee arthroscopy with I&D with snyovectomy for septic arthritis, TEE 6/14 steroids for no cuff leak 6/15 extubated, much more alert 6/16 mitral valve repair. Left chest tube placed for pleural effusion  6/17 too weak to come off vent. Not following commands.   Interim History / Subjective:  Awake but very weak. Can't get her to f/c VTs 300s on PSV 5  Objective   Blood pressure 122/80, pulse 89, temperature 98.2 F (36.8 C), resp. rate 20, height 5\' 6"  (1.676 m), weight 98.1 kg, SpO2 100 %. PAP: (11-59)/(-6-34) 40/20 CVP:  [9 mmHg-40 mmHg] 9 mmHg PCWP:  [23 mmHg] 23 mmHg CO:  [2.4 L/min-4.2 L/min] 3.3 L/min CI:  [1.2 L/min/m2-2.1 L/min/m2] 1.7 L/min/m2  Vent Mode: SIMV;PSV;PRVC FiO2 (%):  [50 %-70 %] 60 % Set Rate:  [10 bmp-12 bmp] 12 bmp Vt Set:  [470 mL-500 mL] 470 mL PEEP:  [5 cmH20-8 cmH20] 5 cmH20 Pressure Support:  [  [place] Pressure:  [18 cmH20-25 cmH20] 19 cmH20   Intake/Output Summary (Last 24 hours) at 06/28/2021 0722 Last data filed at 06/28/2021 0700 Gross per 24 hour  Intake 7081.5 ml  Output 5571 ml  Net 1510.5 ml   Filed Weights   06/26/21 0220 06/27/21 0825 06/28/21 0617  Weight: 90.2 kg 90.2 kg 98.1 kg    Examination: General profoundly weak 46 year old female currenlty on full vent support HENT NCAT no JVD orally intubated Pulm scattered rhonchi. VTs 300s on PSV attempt.  PCXR left sided effusion/atx improved. Some increased right basilar atx, tubes/lines good position  Card RRR  Abd soft not tender  Ext warm and dry; diffuse anasarca  Resolved issues  AKI 2/2 MSSA bacteremia  Acute respiratory failure  Assessment & Plan:    Cerebrovascular accident due to embolic occlusion of left middle cerebral artery - secondary to septic embolization from MSSA endocarditis Plan Cont PT/OT Statin   Sepsis due to MSSA bacteremia, L knee septic arthritis, native MV endocarditis Presume persistent fevers are attributable to endocarditis; now s/p Mitral Valve repair (6/16)  -Appreciate Ortho & TCTS's management for source control.  Plan Cont Naf as directed by ID Post op care per CVTS  Post-operative respiratory failure 2/2 left sided atelectasis and further complicated by small left effusion  -failed rapid wean 6/16; VTs OK 6/17 but very weak and not following commands  Plan Cont PSV as tolerated VAP bundle  PAD protocol RASS goal 0 Chest tube per surgery Am cxr  Cardiogenic shock s/p MVR Plan Keep euvolemic Cont inotropic support   Schizophrenia, prominent negative symptoms versus stroke symptoms Plan Cont Fluphenazine and clozapine   Acute metabolic encephalopathy due to sepsis & stroke, may also have prominent negative symptoms from schizophrenia-- significantly improved alertness since 6/15 Plan Cont Clozapine, fluphenazine and schizophrenia  Cont neuro protective interventions  Fluid & Electrolyte imbalance: Hypokalemia Plan Replace as indicated F/u am   Anemia due to critical illness Hgb holding Plan Intermittent cbc Trigger for transfusion < 7 OR for active bleeding  Hyperglycemia and prediabetes- controlled Plan  Goal 140-180 SSI  At risk for  malnutrition; has been tolerating TF well her entire admission Plan TF as able    Anasarca Plan Diuresis as able  Best Practice (right click and "Reselect all SmartList Selections" daily)   Diet/type: tubefeeds DVT prophylaxis: SCD GI prophylaxis: PPI Lines: N/A Foley:  N/A Code Status:  full code Last date of multidisciplinary goals of care discussion [Mother and cousin updated at bedside on 6/14]  My cct 53 min   Erick Colace ACNP-BC [place] Pager # [phone] OR # [phone] if no answer

## 2021-06-28 NOTE — Anesthesia Postprocedure Evaluation (Signed)
Anesthesia Post Note  Patient: Debra Klein  Procedure(s) Performed: MITRAL VALVE REPAIR USING 28MM SIMUFORM SEMI-RIGID ANNULOPLASTY RING TRANSESOPHAGEAL ECHOCARDIOGRAM (TEE)     Patient location during evaluation: SICU Anesthesia Type: General Level of consciousness: sedated and patient remains intubated per anesthesia plan Pain management: pain level controlled Vital Signs Assessment: post-procedure vital signs reviewed and stable Respiratory status: patient remains intubated per anesthesia plan and patient on ventilator - see flowsheet for VS (plan is for extubation tomorrow) Cardiovascular status: stable (on milrinone) Postop Assessment: no apparent nausea or vomiting Anesthetic complications: no   No notable events documented.  Last Vitals:  Vitals:   06/28/21 2000 06/28/21 2100  BP:    Pulse: 89 89  Resp: (!) 23 (!) 22  Temp: 36.9 C 37.1 C  SpO2: 100% 98%    Last Pain:  Vitals:   06/28/21 2000  TempSrc: Core  PainSc:                  Seleta Rhymes. Marlie Kuennen

## 2021-06-28 NOTE — Progress Notes (Signed)
CT surgery PM rounds  Patient had a very stable day today maintaining cardiac index of 2 on low-dose milrinone and dopamine Atrially paced with adequate urine output Ventilator at FiO2 40% Blood pressure (!) 109/57, pulse 89, temperature 97.9 F (36.6 C), resp. rate (!) 22, height 5\' 6"  (1.676 m), weight 98.1 kg, SpO2 99 %.

## 2021-06-29 ENCOUNTER — Inpatient Hospital Stay (HOSPITAL_COMMUNITY): Payer: Medicare Other

## 2021-06-29 ENCOUNTER — Encounter (HOSPITAL_COMMUNITY): Payer: Self-pay | Admitting: Thoracic Surgery (Cardiothoracic Vascular Surgery)

## 2021-06-29 DIAGNOSIS — N179 Acute kidney failure, unspecified: Secondary | ICD-10-CM | POA: Diagnosis not present

## 2021-06-29 DIAGNOSIS — R6520 Severe sepsis without septic shock: Secondary | ICD-10-CM | POA: Diagnosis not present

## 2021-06-29 DIAGNOSIS — E876 Hypokalemia: Secondary | ICD-10-CM | POA: Diagnosis not present

## 2021-06-29 DIAGNOSIS — G9340 Encephalopathy, unspecified: Secondary | ICD-10-CM | POA: Diagnosis not present

## 2021-06-29 DIAGNOSIS — G9341 Metabolic encephalopathy: Secondary | ICD-10-CM | POA: Diagnosis not present

## 2021-06-29 DIAGNOSIS — A4101 Sepsis due to Methicillin susceptible Staphylococcus aureus: Secondary | ICD-10-CM | POA: Diagnosis not present

## 2021-06-29 LAB — BASIC METABOLIC PANEL
Anion gap: 6 (ref 5–15)
Anion gap: 10 (ref 5–15)
BUN: 14 mg/dL (ref 6–20)
BUN: 15 mg/dL (ref 6–20)
CO2: 21 mmol/L — ABNORMAL LOW (ref 22–32)
CO2: 19 mmol/L — ABNORMAL LOW (ref 22–32)
Calcium: 8.1 mg/dL — ABNORMAL LOW (ref 8.9–10.3)
Calcium: 8.3 mg/dL — ABNORMAL LOW (ref 8.9–10.3)
Chloride: 112 mmol/L — ABNORMAL HIGH (ref 98–111)
Chloride: 112 mmol/L — ABNORMAL HIGH (ref 98–111)
Creatinine, Ser: 0.82 mg/dL (ref 0.44–1.00)
Creatinine, Ser: 0.99 mg/dL (ref 0.44–1.00)
GFR, Estimated: >60 mL/min (ref >60)
GFR, Estimated: >60 mL/min (ref >60)
Glucose, Bld: 135 mg/dL — ABNORMAL HIGH (ref 70–99)
Glucose, Bld: 122 mg/dL — ABNORMAL HIGH (ref 70–99)
Potassium: 3.1 mmol/L — ABNORMAL LOW (ref 3.5–5.1)
Potassium: 2.9 mmol/L — ABNORMAL LOW (ref 3.5–5.1)
Sodium: 139 mmol/L (ref 135–145)
Sodium: 141 mmol/L (ref 135–145)

## 2021-06-29 LAB — GLUCOSE, CAPILLARY
Glucose-Capillary: 116 mg/dL — ABNORMAL HIGH (ref 70–99)
Glucose-Capillary: 120 mg/dL — ABNORMAL HIGH (ref 70–99)
Glucose-Capillary: 128 mg/dL — ABNORMAL HIGH (ref 70–99)
Glucose-Capillary: 128 mg/dL — ABNORMAL HIGH (ref 70–99)
Glucose-Capillary: 128 mg/dL — ABNORMAL HIGH (ref 70–99)
Glucose-Capillary: 129 mg/dL — ABNORMAL HIGH (ref 70–99)
Glucose-Capillary: 63 mg/dL — ABNORMAL LOW (ref 70–99)

## 2021-06-29 LAB — CBC
HCT: 28 % — ABNORMAL LOW (ref 36.0–46.0)
Hemoglobin: 9.6 g/dL — ABNORMAL LOW (ref 12.0–15.0)
MCH: 28.5 pg (ref 26.0–34.0)
MCHC: 34.3 g/dL (ref 30.0–36.0)
MCV: 83.1 fL (ref 80.0–100.0)
Platelets: 269 10*3/uL (ref 150–400)
RBC: 3.37 MIL/uL — ABNORMAL LOW (ref 3.87–5.11)
RDW: 17.8 % — ABNORMAL HIGH (ref 11.5–15.5)
WBC: 33.6 10*3/uL — ABNORMAL HIGH (ref 4.0–10.5)
nRBC: 0 % (ref 0.0–0.2)

## 2021-06-29 LAB — COOXEMETRY PANEL
Carboxyhemoglobin: 0.8 % (ref 0.5–1.5)
Methemoglobin: 0.8 % (ref 0.0–1.5)
O2 Saturation: 70.8 %
Total hemoglobin: 9.6 g/dL — ABNORMAL LOW (ref 12.0–16.0)

## 2021-06-29 LAB — MAGNESIUM: Magnesium: 2.1 mg/dL (ref 1.7–2.4)

## 2021-06-29 MED ORDER — POTASSIUM CHLORIDE 10 MEQ/50ML IV SOLN
10.0000 meq | INTRAVENOUS | Status: AC
  Administered 2021-06-29 (×3): 10 meq via INTRAVENOUS
  Filled 2021-06-29 (×4): qty 50

## 2021-06-29 MED ORDER — MILRINONE LACTATE IN DEXTROSE 20-5 MG/100ML-% IV SOLN
0.1250 ug/kg/min | INTRAVENOUS | Status: DC
  Filled 2021-06-29: qty 100

## 2021-06-29 MED ORDER — FUROSEMIDE 10 MG/ML IJ SOLN
40.0000 mg | Freq: Two times a day (BID) | INTRAMUSCULAR | Status: DC
Start: 2021-06-29 — End: 2021-07-04
  Administered 2021-06-29 – 2021-07-04 (×10): 40 mg via INTRAVENOUS
  Filled 2021-06-29 (×10): qty 4

## 2021-06-29 MED ORDER — ROCURONIUM BROMIDE 10 MG/ML (PF) SYRINGE
PREFILLED_SYRINGE | INTRAVENOUS | Status: AC
  Filled 2021-06-29: qty 10

## 2021-06-29 MED ORDER — POTASSIUM CHLORIDE 10 MEQ/50ML IV SOLN
10.0000 meq | INTRAVENOUS | Status: AC
  Administered 2021-06-29 – 2021-06-30 (×6): 10 meq via INTRAVENOUS
  Filled 2021-06-29 (×5): qty 50

## 2021-06-29 MED ORDER — VANCOMYCIN HCL 1750 MG/350ML IV SOLN: 1750.0000 mg | INTRAVENOUS | Status: DC

## 2021-06-29 MED ORDER — POTASSIUM CHLORIDE 10 MEQ/50ML IV SOLN
10.0000 meq | INTRAVENOUS | Status: AC
  Administered 2021-06-29 (×3): 10 meq via INTRAVENOUS
  Filled 2021-06-29 (×3): qty 50

## 2021-06-29 MED ORDER — FENTANYL CITRATE PF 50 MCG/ML IJ SOSY: 50.0000 ug | PREFILLED_SYRINGE | INTRAMUSCULAR | Status: DC | PRN

## 2021-06-29 MED ORDER — FUROSEMIDE 10 MG/ML IJ SOLN
40.0000 mg | Freq: Once | INTRAMUSCULAR | Status: AC
  Administered 2021-06-29: 40 mg via INTRAVENOUS
  Filled 2021-06-29: qty 4

## 2021-06-29 MED ORDER — POTASSIUM CHLORIDE 10 MEQ/50ML IV SOLN
10.0000 meq | Freq: Once | INTRAVENOUS | Status: AC
Start: 2021-06-29 — End: 2021-06-29
  Administered 2021-06-29: 10 meq via INTRAVENOUS
  Filled 2021-06-29: qty 50

## 2021-06-29 MED ORDER — DOPAMINE-DEXTROSE 3.2-5 MG/ML-% IV SOLN: 2.5000 ug/kg/min | INTRAVENOUS | Status: DC

## 2021-06-29 MED ORDER — ENOXAPARIN SODIUM 40 MG/0.4ML IJ SOSY: 40.0000 mg | PREFILLED_SYRINGE | INTRAMUSCULAR | Status: DC

## 2021-06-29 MED ORDER — ACETAMINOPHEN 10 MG/ML IV SOLN
1000.0000 mg | Freq: Four times a day (QID) | INTRAVENOUS | Status: DC
  Administered 2021-06-29: 1000 mg via INTRAVENOUS

## 2021-06-29 MED ORDER — POTASSIUM CHLORIDE 10 MEQ/50ML IV SOLN: 10.0000 meq | INTRAVENOUS | Status: DC

## 2021-06-29 MED ORDER — DEXTROSE 50 % IV SOLN
12.5000 g | INTRAVENOUS | Status: DC | PRN
  Administered 2021-06-29: 12.5 g via INTRAVENOUS
  Filled 2021-06-29: qty 50

## 2021-06-29 MED ORDER — VANCOMYCIN HCL 2000 MG/400ML IV SOLN
2000.0000 mg | Freq: Once | INTRAVENOUS | Status: AC
  Administered 2021-06-29: 2000 mg via INTRAVENOUS
  Filled 2021-06-29: qty 400

## 2021-06-29 MED ORDER — FENTANYL CITRATE PF 50 MCG/ML IJ SOSY
25.0000 ug | PREFILLED_SYRINGE | INTRAMUSCULAR | Status: DC | PRN
  Administered 2021-06-29: 25 ug via INTRAVENOUS
  Administered 2021-06-29: 50 ug via INTRAVENOUS
  Filled 2021-06-29 (×2): qty 1

## 2021-06-29 MED ORDER — PANTOPRAZOLE SODIUM 40 MG IV SOLR
40.0000 mg | Freq: Every day | INTRAVENOUS | Status: DC
  Administered 2021-06-29 – 2021-07-07 (×9): 40 mg via INTRAVENOUS
  Filled 2021-06-29 (×9): qty 10

## 2021-06-29 MED ORDER — IPRATROPIUM-ALBUTEROL 0.5-2.5 (3) MG/3ML IN SOLN
3.0000 mL | Freq: Four times a day (QID) | RESPIRATORY_TRACT | Status: DC | PRN
Start: 2021-06-29 — End: 2021-07-18
  Administered 2021-06-29: 3 mL via RESPIRATORY_TRACT
  Filled 2021-06-29: qty 3

## 2021-06-29 MED ORDER — ENOXAPARIN SODIUM 60 MG/0.6ML IJ SOSY
50.0000 mg | PREFILLED_SYRINGE | INTRAMUSCULAR | Status: DC
  Administered 2021-06-29: 50 mg via SUBCUTANEOUS
  Filled 2021-06-29: qty 0.5

## 2021-06-29 NOTE — Progress Notes (Signed)
PHARMACIST - PHYSICIAN COMMUNICATION  CONCERNING:  Enoxaparin (Lovenox) for DVT Prophylaxis    RECOMMENDATION: Patient was prescribed enoxaprin 40mg  q24 hours for VTE prophylaxis.   Filed Weights   06/27/21 0825 06/28/21 0617 06/29/21 0500  Weight: 90.2 kg (198 lb 13.7 oz) 98.1 kg (216 lb 4.3 oz) 102.7 kg (226 lb 6.6 oz)    Body mass index is 36.54 kg/m.  Estimated Creatinine Clearance: 86 mL/min (by C-G formula based on SCr of 0.99 mg/dL).   Based on [place] patient is candidate for enoxaparin 0.5mg /kg TBW SQ every 24 hours based on BMI being >30.  DESCRIPTION: Pharmacy has adjusted enoxaparin dose per [facility] policy.  Patient is now receiving enoxaparin 50 mg every 24 hours    Berta Minor, PharmD Clinical Pharmacist  06/29/2021 7:19 PM

## 2021-06-29 NOTE — Progress Notes (Signed)
NAME:  Debra Klein, MRN:  [id number], DOB:  1975/06/24, LOS: 22 ADMISSION DATE:  06/19/2021, CONSULTATION DATE:  06/19/21 REFERRING MD:  Laurena Spies, CHIEF COMPLAINT:  respiratory failure   History of Present Illness:  Debra Klein is a 46 year old woman with a history of schizophrenia who presented to the hospital today with acute encephalopathy. History obtained through chart review:   Family reports that about 3 weeks ago the patient started to be less verbal, but was still managing her medications. They were noticing for the last few days they would find a pill or 2 dropped on the floor although she was still overall taking her medications.  Her verbal output was actually improving in the last week or so, but then acutely today she was completely nonverbal and so they came to the ED for further evaluation.  They report she did not have any other complaints and did not notice any fevers or any physical issues other than left leg swelling and pain   Per chart review, she stopped taking her medications approximately 1 week ago, had a fall on 06/15/2021 with left ankle swelling, became nonverbal on 6/6, and subsequently had another fall on 6/7 found to have a left lower leg fracture and discharged with a cam walker boot after radiographs of her left ankle and tibia/fibula revealed previous intramedullary rodding and rod removal of the left tibia without clear evidence of acute fracture.  Subsequently she fell again on 6/8 and was found to be febrile (101.2), tachycardic to 110s, noted to be nonverbal, not following commands, frequently dozing off but withdrawing in all 4 extremities without a facial droop.  She was started on vancomycin, ceftriaxone, acyclovir for meningitis coverage.  Additionally, per hospitalist admission note LP was attempted but unsuccessful at bedside.  Subsequently at approximately 9 PM the patient had a change in mental status (drooling) with gaze deviation to the left and was  intubated. PCCM consulted. Head CT demonstrating an insular hypodensity concerning for MCA infarct.   Pertinent  Medical History  Schizophrenia HTN anemia  Significant Hospital Events: Including procedures, antibiotic start and stop dates in addition to other pertinent events   6/7: Fall --> fibula fracture, Minimally verbal at an outpatient visit with [facility]  6/8: Presented to the ED nonverbal with altered mentation. Progressively worsening mentation with requirement of intubation for airway protection.  Empirically treated for sepsis, presumed meningitis. MRI with large MCA infarct and punctate right hemisphere infarcts.  6/9: Blood cultures obtained on arrival positive for MSSA  6/10 MRI knee demonstrates septic arthritis.  6/13 L knee arthroscopy with I&D with snyovectomy for septic arthritis, TEE 6/14 steroids for no cuff leak 6/15 extubated, much more alert 6/16 mitral valve repair. Left chest tube placed for pleural effusion  6/17 too weak to come off vent. Not following commands.  6/18 extubated  Interim History / Subjective:  Tidal volumes acceptable, much more awake and strong ready for extubation  Objective   Blood pressure (Abnormal) 109/57, pulse 89, temperature 99.3 F (37.4 C), resp. rate (Abnormal) 22, height 5\' 6"  (1.676 m), weight 102.7 kg, SpO2 99 %. PAP: (31-46)/(13-25) 41/22 CVP:  [8 mmHg-18 mmHg] 16 mmHg CO:  [3.7 L/min-4.9 L/min] 4.9 L/min CI:  [1.8 L/min/m2-2.5 L/min/m2] 2.5 L/min/m2  Vent Mode: PSV FiO2 (%):  [40 %-50 %] 40 % Set Rate:  [12 bmp] 12 bmp Vt Set:  [470 mL] 470 mL PEEP:  [5 cmH20] 5 cmH20 Pressure Support:  [10 cmH20] 10 cmH20 Plateau Pressure:  [15  cmH20-17 cmH20] 16 cmH20   Intake/Output Summary (Last 24 hours) at 06/29/2021 0717 Last data filed at 06/29/2021 0700 Gross per 24 hour  Intake 2289.24 ml  Output 1930 ml  Net 359.24 ml   Filed Weights   06/27/21 0825 06/28/21 0617 06/29/21 0500  Weight: 90.2 kg 98.1 kg 102.7 kg    Examination: General 46 year old female patient resting in bed currently in no acute distress on pressure support much more interactive today appears stronger HEENT normocephalic atraumatic Pulmonary clear, diminished bases.  No accessory use on pressure support of 5 tidal volumes in the 400 range PCXR ETT good position. Aeration a little better  Regular rhythm Abdomen soft nontender Extremities warm dry with generalized anasarca Neuro awake, follows commands, right-sided hemiparesis GU clear yellow Resolved issues  AKI 2/2 MSSA bacteremia  Acute respiratory failure  Assessment & Plan:    Cerebrovascular accident due to embolic occlusion of left middle cerebral artery - secondary to septic embolization from MSSA endocarditis Plan PT/OT Post-op care   Sepsis due to MSSA bacteremia, L knee septic arthritis, native MV endocarditis Presume persistent fevers are attributable to endocarditis; now s/p Mitral Valve repair (6/16)  -Appreciate Ortho & TCTS's management for source control.  Plan Post op per surg Cont Naf as directed by ID  Post-operative respiratory failure 2/2 left sided atelectasis and further complicated by small left effusion  -failed rapid wean 6/16; VTs OK 6/17 but very weak and not following commands  Plan CT management per surg Extubate N.p.o. with swallow eval have gone ahead and ordered core track Spirometry as able Mobilize as able As needed chest x-ray  Cardiogenic shock s/p MVR Plan Keeping Euvolemic  Titrate inotropes per protocol   Schizophrenia, prominent negative symptoms versus stroke symptoms Plan Cont Fluphenazine and Clozapine  Acute metabolic encephalopathy due to sepsis & stroke, may also have prominent negative symptoms from schizophrenia-- significantly improved alertness since 6/15 Plan Cont Clozapine, and Prolixin  Cont neuro-protective interventions  Fluid & Electrolyte imbalance: Hypokalemia Plan Replace K and recheck in am.    Anemia due to critical illness Hgb holding Plan Intermittent CBC; trigger for transfusion <7  Hyperglycemia and prediabetes- controlled Plan  Ssi for CBG goal 140-180  At risk for malnutrition; has been tolerating TF well her entire admission Plan TFs    Anasarca Plan Assess daily for diuresis   Best Practice (right click and "Reselect all SmartList Selections" daily)   Diet/type: tubefeeds DVT prophylaxis: SCD GI prophylaxis: PPI Lines: N/A Foley:  N/A Code Status:  full code Last date of multidisciplinary goals of care discussion [Mother and cousin updated at bedside on 6/14]  My cct 32 minutes  Erick Colace ACNP-BC [place] Pager # [phone] OR # [phone] if no answer

## 2021-06-29 NOTE — Procedures (Signed)
Extubation Procedure Note  Patient Details:   Name: Debra Klein DOB: 02-03-1975 MRN: [id number]   Airway Documentation:    Vent end date: 06/29/21 Vent end time: 0814   Evaluation  O2 sats: stable throughout Complications: No apparent complications Patient did tolerate procedure well. Bilateral Breath Sounds: Expiratory wheezes, Diminished   Yes  Minimal cuff leak noted, CCM aware.  Patient placed on NC4 L with humidity, no stridor noted.  Due to Exp. Wheezing, Douneb neb Q6PRN ordered.  Bayard Beaver 06/29/2021, 8:34 AM

## 2021-06-29 NOTE — Evaluation (Signed)
Clinical/Bedside Swallow Evaluation Patient Details  Name: Debra Klein MRN: [id number] Date of Birth: Dec 22, 1975  Today's Date: 06/29/2021 Time: SLP Start Time (ACUTE ONLY): 1325 SLP Stop Time (ACUTE ONLY): 1345 SLP Time Calculation (min) (ACUTE ONLY): 20 min  Past Medical History:  Past Medical History:  Diagnosis Date   Hyperlipemia    Hypertension    IDA (iron deficiency anemia)    Schizophrenia ([place])    Past Surgical History:  Past Surgical History:  Procedure Laterality Date   KNEE ARTHROSCOPY Left 06/24/2021   Procedure: ARTHROSCOPY LEFT KNEE IRRIGRATION AND [place] AND SYNOVECTOMY;  Surgeon: Mcarthur Rossetti, MD;  Location: [place];  Service: Orthopedics;  Laterality: Left;   MITRAL VALVE REPAIR N/A 06/27/2021   Procedure: MITRAL VALVE REPAIR USING 28MM SIMUFORM SEMI-RIGID ANNULOPLASTY RING;  Surgeon: Melrose Nakayama, MD;  Location: [place];  Service: Open Heart Surgery;  Laterality: N/A;   No previous surgery     TEE WITHOUT CARDIOVERSION N/A 06/27/2021   Procedure: TRANSESOPHAGEAL ECHOCARDIOGRAM (TEE);  Surgeon: Melrose Nakayama, MD;  Location: [place];  Service: Open Heart Surgery;  Laterality: N/A;   HPI:  Patient is a 46 y.o. female with PMH: HTN, HLD, iron deficiency, anemia, schizophrenia who presented to the hospital on 06/17/21 to ED for evaluation of AMS. Patient initially non-verbal but awake and alert in ED. Mother reported patient as ambulatory and verbal at baseline but had not been eating or drinking for past two weeks (also not taking medications). She has had multiple falls, seen by orthopedic day before admission and questioned possible fracture left ankle and placed in Cam boot. In ED, patient admitted with severe sepsis of unknown source; MRI showed large Left MCA infarct. Patient required intubation on 6/8-6/15/23 and was intubated for surgical procedure (mitral valve repair) on 6/16 and was not able to come off vent until 6/18.    Assessment /  Plan / Recommendation  Clinical Impression  Patient currently presenting with clinical s/s of dysphagia as per this bedside/clinical swallow evaluation which is likely combination of post large left MCA CVA as well as 11 day total intubation. After oral care, patient able to orally accept small pieces of ice. Initially she would not move ice chip from anterior portion of oral cavity but after SLP verbal cues and modeling, she started to initiate mastication and swallow of ice chips. Swallow initiation delay suspected but no overt s/s aspiration or peneteration observed. She did exhibit immediate cough response with small straw sips of thin liquids (water). SLP did not trial any more PO's and recommending continue NPO status but allow small amounts ice chips PRN after oral care and with full supervision from nursing/SLP. Plan to determine readiness for objective swallow study (might be FEES secondary to difficult transport for MBS due to multiple lines/leads. SLP Visit Diagnosis: Dysphagia, unspecified (R13.10)    Aspiration Risk  Mild aspiration risk;Moderate aspiration risk;Risk for inadequate nutrition/hydration    Diet Recommendation NPO;Ice chips PRN after oral care   Medication Administration: Via alternative means    Other  Recommendations Oral Care Recommendations: Oral care QID;Staff/trained caregiver to provide oral care    Recommendations for follow up therapy are one component of a multi-disciplinary discharge planning process, led by the attending physician.  Recommendations may be updated based on patient status, additional functional criteria and insurance authorization.  Follow up Recommendations Acute inpatient rehab (3hours/day)      Assistance Recommended at Discharge Frequent or constant Supervision/Assistance  Functional Status Assessment Patient has  had a recent decline in their functional status and demonstrates the ability to make significant improvements in function in a  reasonable and predictable amount of time.  Frequency and Duration min 2x/week  2 weeks       Prognosis Prognosis for Safe Diet Advancement: Good Barriers to Reach Goals: Severity of deficits;Cognitive deficits      Swallow Study   General Date of Onset: 06/17/21 HPI: Patient is a 46 y.o. female with PMH: HTN, HLD, iron deficiency, anemia, schizophrenia who presented to the hospital on 06/17/21 to ED for evaluation of AMS. Patient initially non-verbal but awake and alert in ED. Mother reported patient as ambulatory and verbal at baseline but had not been eating or drinking for past two weeks (also not taking medications). She has had multiple falls, seen by orthopedic day before admission and questioned possible fracture left ankle and placed in Cam boot. In ED, patient admitted with severe sepsis of unknown source; MRI showed large Left MCA infarct. Patient required intubation on 6/8-6/15/23 and was intubated for surgical procedure (mitral valve repair) on 6/16 and was not able to come off vent until 6/18. Type of Study: Bedside Swallow Evaluation Previous Swallow Assessment: none found Diet Prior to this Study: NPO Temperature Spikes Noted: No Respiratory Status: Nasal cannula History of Recent Intubation: Yes Length of Intubations (days):  (total=11 (6/8-6/15 and 6/16-6/18)) Date extubated: 06/29/21 Behavior/Cognition: Alert;Cooperative;Confused;Requires cueing Oral Cavity Assessment: Within Functional Limits Oral Care Completed by SLP: Yes Oral Cavity - Dentition: Adequate natural dentition Self-Feeding Abilities: Total assist Patient Positioning: Upright in bed Baseline Vocal Quality: Low vocal intensity Volitional Cough: Cognitively unable to elicit Volitional Swallow: Unable to elicit    Oral/Motor/Sensory Function Overall Oral Motor/Sensory Function: Moderate impairment Facial ROM: Reduced right;Suspected CN VII (facial) dysfunction Facial Symmetry: Abnormal symmetry  right;Suspected CN VII (facial) dysfunction Facial Strength: Reduced right;Suspected CN VII (facial) dysfunction Lingual Strength: Reduced   Ice Chips Ice chips: Impaired Oral Phase Impairments: Impaired mastication   Thin Liquid Thin Liquid: Impaired Presentation: Straw;Spoon Oral Phase Impairments: Poor awareness of bolus Pharyngeal  Phase Impairments: Suspected delayed Swallow;Cough - Immediate    Nectar Thick     Honey Thick     Puree Puree: Not tested   Solid     Solid: Not tested      Sonia Baller, MA, CCC-SLP Speech Therapy

## 2021-06-29 NOTE — Progress Notes (Signed)
Pharmacy Antibiotic Note  Debra Klein is a 46 y.o. female admitted on 06/19/2021 with MSSA bacteremia/endocarditis. S/p valve repair  Pharmacy has been consulted to change nafcillin to vancomycin dosing until tissue cultures finalized.  Plan: Stop nafcillin for now. Vancomycin 2g IV x 1, then 1750 mg IV q 24 hrs (Calculated AUC 501 w/ Scr 0.82). F/u cultures.  Height: 5\' 6"  (167.6 cm) Weight: 102.7 kg (226 lb 6.6 oz) IBW/kg (Calculated) : 59.3  Temp (24hrs), Avg:98.9 F (37.2 C), Min:97.9 F (36.6 C), Max:99.3 F (37.4 C)  Recent Labs  Lab 06/27/21 1429 06/27/21 1600 06/27/21 2106 06/28/21 0300 06/28/21 1717 06/29/21 0410  WBC  --  46.7* 34.3* 26.9* 27.6* 33.6*  CREATININE 0.50  --  0.80 0.78 0.81 0.82    Estimated Creatinine Clearance: 103.8 mL/min (by C-G formula based on SCr of 0.82 mg/dL).    No Known Allergies  6/8 Vancomycin >>6/9 6/8 Ceftriaxone >>6/9 6/8 Acyclovir x1 6/9 Nafcillin >>(7/28)- changed to vanc 6/18 6/18 Vanc >  6/8 BCID: MSSA 6/8 BCx: MSSA 6/8 MRSA PCR: positive 6/10 BCx2: neg 6/12 knee fluid cx: MSSA 6/13 TA: few candida albicans 6/16 Tissue - staph epi  Thank you for allowing pharmacy to be a part of this patient's care.  [place] Crane, Roylene Reason, BCCP Clinical Pharmacist  06/29/2021 2:36 PM   [facility] pharmacy phone numbers are listed on [email or web address]

## 2021-06-29 NOTE — Progress Notes (Signed)
    [facility] for Infectious Disease   Reason for visit: Follow up on endocarditis, s/p MV repair  Interval History: tissue culture with Staph epidermidis, sensitivities pending  Physical Exam: Constitutional:  Vitals:   06/29/21 0825 06/29/21 0900  BP: (!) 101/52   Pulse: 90 89  Resp: (!) 24 16  Temp: 99.3 F (37.4 C) 99.3 F (37.4 C)  SpO2: 99% 100%   patient appears in NAD  Impression: MV endocarditis.  Now with new tissue growth.   Plan: 1.  I will add vancomycin and stop nafcillin for now pending sensitivities of Staph epi on tissue culture.

## 2021-06-29 NOTE — Progress Notes (Signed)
2 Days Post-Op Procedure(s) (LRB): MITRAL VALVE REPAIR USING 28MM SIMUFORM SEMI-RIGID ANNULOPLASTY RING (N/A) TRANSESOPHAGEAL ECHOCARDIOGRAM (TEE) (N/A) Subjective: Patient doing very well now, extubated Cardiac index remains greater than 2.0.  Weaning inotropes, off norepinephrine, milrinone and dopamine reduced to low-dose. Maintaining sinus rhythm but still atrially paced to optimize output Weight significantly increased above preop, Lasix dosing increased as well Patient was able to sit on side of bed for over 15 minutes. We will remove all chest tubes and PA catheter today She is n.p.o. and core track feeding tube is planned Chest x-ray today is clear.   Objective: Vital signs in last 24 hours: Temp:  [97.9 F (36.6 C)-99.3 F (37.4 C)] 99.3 F (37.4 C) (06/18 0900) Pulse Rate:  [89-90] 89 (06/18 0900) Cardiac Rhythm: Atrial paced (06/18 0700) Resp:  [16-26] 16 (06/18 0900) BP: (101-115)/(52-62) 101/52 (06/18 0825) SpO2:  [97 %-100 %] 100 % (06/18 0900) Arterial Line BP: (103-140)/(47-64) 110/54 (06/18 0900) FiO2 (%):  [40 %] 40 % (06/18 0800) Weight:  [102.7 kg] 102.7 kg (06/18 0500)  Hemodynamic parameters for last 24 hours: PAP: (31-44)/(13-25) 36/18 CVP:  [8 mmHg-18 mmHg] 13 mmHg CO:  [4.1 L/min-4.9 L/min] 4.9 L/min CI:  [2.1 L/min/m2-2.5 L/min/m2] 2.5 L/min/m2  Intake/Output from previous day: 06/17 0701 - 06/18 0700 In: 2289.2 [I.V.:1220.7; IV Piggyback:1068.6] Out: 1930 [Urine:1180; Emesis/NG output:350; Stool:100; Chest Tube:300] Intake/Output this shift: No intake/output data recorded.    Lab Results: Recent Labs    06/28/21 1717 06/29/21 0410  WBC 27.6* 33.6*  HGB 9.8* 9.6*  HCT 28.3* 28.0*  PLT 236 269   BMET:  Recent Labs    06/28/21 1717 06/29/21 0410  NA 142 139  K 3.0* 3.1*  CL 113* 112*  CO2 22 21*  GLUCOSE 135* 135*  BUN 13 14  CREATININE 0.81 0.82  CALCIUM 8.2* 8.1*    PT/INR:  Recent Labs    06/27/21 1600  LABPROT 18.1*   INR 1.5*   ABG    Component Value Date/Time   PHART 7.351 06/28/2021 0401   HCO3 22.8 06/28/2021 0401   TCO2 24 06/28/2021 0401   ACIDBASEDEF 3.0 (H) 06/28/2021 0401   O2SAT 70.8 06/29/2021 0407   CBG (last 3)  Recent Labs    06/29/21 0414 06/29/21 0831 06/29/21 1046  GLUCAP 128* 128* 128*    Assessment/Plan: S/P Procedure(s) (LRB): MITRAL VALVE REPAIR USING 28MM SIMUFORM SEMI-RIGID ANNULOPLASTY RING (N/A) TRANSESOPHAGEAL ECHOCARDIOGRAM (TEE) (N/A) Mobilize Diuresis Diabetes control d/c tubes/lines Continue IV amiodarone for now Lovenox prophylaxis dose started  LOS: 10 days    Debra Klein 06/29/2021

## 2021-06-29 NOTE — Progress Notes (Signed)
CT surgery PM rounds  Patient had very stable day sitting on the edge of the bed Feeding tube will be placed tomorrow and in the interim she will receive IV acetaminophen for pain Inotropes weaned down to low-dose dopamine and milrinone Maintaining sinus rhythm and atrially paced  Blood pressure (!) 101/52, pulse 90, temperature (!) 97.5 F (36.4 C), temperature source Oral, resp. rate 14, height 5\' 6"  (1.676 m), weight 102.7 kg, SpO2 99 %.

## 2021-06-30 ENCOUNTER — Inpatient Hospital Stay (HOSPITAL_COMMUNITY): Payer: Medicare Other

## 2021-06-30 DIAGNOSIS — N179 Acute kidney failure, unspecified: Secondary | ICD-10-CM | POA: Diagnosis not present

## 2021-06-30 DIAGNOSIS — G9341 Metabolic encephalopathy: Secondary | ICD-10-CM | POA: Diagnosis not present

## 2021-06-30 DIAGNOSIS — R6520 Severe sepsis without septic shock: Secondary | ICD-10-CM | POA: Diagnosis not present

## 2021-06-30 DIAGNOSIS — I330 Acute and subacute infective endocarditis: Secondary | ICD-10-CM

## 2021-06-30 DIAGNOSIS — I63412 Cerebral infarction due to embolism of left middle cerebral artery: Secondary | ICD-10-CM | POA: Diagnosis not present

## 2021-06-30 DIAGNOSIS — A4101 Sepsis due to Methicillin susceptible Staphylococcus aureus: Secondary | ICD-10-CM | POA: Diagnosis not present

## 2021-06-30 DIAGNOSIS — R7881 Bacteremia: Secondary | ICD-10-CM | POA: Diagnosis not present

## 2021-06-30 DIAGNOSIS — G9340 Encephalopathy, unspecified: Secondary | ICD-10-CM | POA: Diagnosis not present

## 2021-06-30 LAB — BPAM RBC
Blood Product Expiration Date: 202307052359
Blood Product Expiration Date: 202307082359
Blood Product Expiration Date: 202307082359
Blood Product Expiration Date: 202307082359
Blood Product Expiration Date: 202307082359
Blood Product Expiration Date: 202307082359
Blood Product Expiration Date: 202307092359
ISSUE DATE / TIME: 202306150654
ISSUE DATE / TIME: 202306160911
ISSUE DATE / TIME: 202306160911
ISSUE DATE / TIME: 202306160911
ISSUE DATE / TIME: 202306160911
Unit Type and Rh: 6200
Unit Type and Rh: 6200
Unit Type and Rh: 6200
Unit Type and Rh: 6200
Unit Type and Rh: 6200
Unit Type and Rh: 6200
Unit Type and Rh: 6200

## 2021-06-30 LAB — PROTIME-INR
INR: 3.2 — ABNORMAL HIGH (ref 0.8–1.2)
INR: 3 — ABNORMAL HIGH (ref 0.8–1.2)
Prothrombin Time: 32.7 seconds — ABNORMAL HIGH (ref 11.4–15.2)
Prothrombin Time: 31.2 seconds — ABNORMAL HIGH (ref 11.4–15.2)

## 2021-06-30 LAB — COMPREHENSIVE METABOLIC PANEL
ALT: 6 U/L (ref 0–44)
AST: 24 U/L (ref 15–41)
Albumin: 2 g/dL — ABNORMAL LOW (ref 3.5–5.0)
Alkaline Phosphatase: 48 U/L (ref 38–126)
Anion gap: 8 (ref 5–15)
BUN: 16 mg/dL (ref 6–20)
CO2: 19 mmol/L — ABNORMAL LOW (ref 22–32)
Calcium: 8.4 mg/dL — ABNORMAL LOW (ref 8.9–10.3)
Chloride: 111 mmol/L (ref 98–111)
Creatinine, Ser: 1.05 mg/dL — ABNORMAL HIGH (ref 0.44–1.00)
GFR, Estimated: >60 mL/min (ref >60)
Glucose, Bld: 118 mg/dL — ABNORMAL HIGH (ref 70–99)
Potassium: 3.5 mmol/L (ref 3.5–5.1)
Sodium: 138 mmol/L (ref 135–145)
Total Bilirubin: 1.2 mg/dL (ref 0.3–1.2)
Total Protein: 5.9 g/dL — ABNORMAL LOW (ref 6.5–8.1)

## 2021-06-30 LAB — GLUCOSE, CAPILLARY
Glucose-Capillary: 113 mg/dL — ABNORMAL HIGH (ref 70–99)
Glucose-Capillary: 115 mg/dL — ABNORMAL HIGH (ref 70–99)
Glucose-Capillary: 120 mg/dL — ABNORMAL HIGH (ref 70–99)
Glucose-Capillary: 138 mg/dL — ABNORMAL HIGH (ref 70–99)
Glucose-Capillary: 144 mg/dL — ABNORMAL HIGH (ref 70–99)
Glucose-Capillary: 145 mg/dL — ABNORMAL HIGH (ref 70–99)
Glucose-Capillary: 159 mg/dL — ABNORMAL HIGH (ref 70–99)

## 2021-06-30 LAB — TYPE AND SCREEN
ABO/RH(D): A POS
Antibody Screen: NEGATIVE
Unit division: 0
Unit division: 0
Unit division: 0
Unit division: 0
Unit division: 0
Unit division: 0
Unit division: 0

## 2021-06-30 LAB — POCT I-STAT 7, (LYTES, BLD GAS, ICA,H+H)
Acid-base deficit: 5 mmol/L — ABNORMAL HIGH (ref 0.0–2.0)
Bicarbonate: 18.2 mmol/L — ABNORMAL LOW (ref 20.0–28.0)
Calcium, Ion: 1.26 mmol/L (ref 1.15–1.40)
HCT: 26 % — ABNORMAL LOW (ref 36.0–46.0)
Hemoglobin: 8.8 g/dL — ABNORMAL LOW (ref 12.0–15.0)
O2 Saturation: 96 %
Patient temperature: 98.5
Potassium: 2.7 mmol/L — CL (ref 3.5–5.1)
Sodium: 143 mmol/L (ref 135–145)
TCO2: 19 mmol/L — ABNORMAL LOW (ref 22–32)
pCO2 arterial: 26.6 mmHg — ABNORMAL LOW (ref 32–48)
pH, Arterial: 7.442 (ref 7.35–7.45)
pO2, Arterial: 78 mmHg — ABNORMAL LOW (ref 83–108)

## 2021-06-30 LAB — COOXEMETRY PANEL
Carboxyhemoglobin: 0.9 % (ref 0.5–1.5)
Carboxyhemoglobin: 1.6 % — ABNORMAL HIGH (ref 0.5–1.5)
Methemoglobin: <0.7 % (ref 0.0–1.5)
Methemoglobin: <0.7 % (ref 0.0–1.5)
O2 Saturation: 67.7 %
O2 Saturation: 60.2 %
Total hemoglobin: 9.6 g/dL — ABNORMAL LOW (ref 12.0–16.0)
Total hemoglobin: 10 g/dL — ABNORMAL LOW (ref 12.0–16.0)

## 2021-06-30 LAB — CBC
HCT: 28.5 % — ABNORMAL LOW (ref 36.0–46.0)
Hemoglobin: 9.4 g/dL — ABNORMAL LOW (ref 12.0–15.0)
MCH: 28 pg (ref 26.0–34.0)
MCHC: 33 g/dL (ref 30.0–36.0)
MCV: 84.8 fL (ref 80.0–100.0)
Platelets: 333 10*3/uL (ref 150–400)
RBC: 3.36 MIL/uL — ABNORMAL LOW (ref 3.87–5.11)
RDW: 18.7 % — ABNORMAL HIGH (ref 11.5–15.5)
WBC: 30.8 10*3/uL — ABNORMAL HIGH (ref 4.0–10.5)
nRBC: 0 % (ref 0.0–0.2)

## 2021-06-30 LAB — MAGNESIUM: Magnesium: 2 mg/dL (ref 1.7–2.4)

## 2021-06-30 LAB — SURGICAL PATHOLOGY

## 2021-06-30 MED ORDER — ASPIRIN 81 MG PO CHEW: 81.0000 mg | CHEWABLE_TABLET | Freq: Every day | ORAL | Status: DC

## 2021-06-30 MED ORDER — ACETAMINOPHEN 160 MG/5ML PO SOLN
1000.0000 mg | Freq: Four times a day (QID) | ORAL | Status: DC
  Administered 2021-07-01 – 2021-07-02 (×4): 1000 mg
  Filled 2021-06-30 (×4): qty 40.6

## 2021-06-30 MED ORDER — ORAL CARE MOUTH RINSE: 15.0000 mL | OROMUCOSAL | Status: DC | PRN

## 2021-06-30 MED ORDER — POTASSIUM CHLORIDE 10 MEQ/50ML IV SOLN
10.0000 meq | INTRAVENOUS | Status: AC
  Administered 2021-06-30 (×3): 10 meq via INTRAVENOUS
  Filled 2021-06-30 (×3): qty 50

## 2021-06-30 MED ORDER — CLOZAPINE 100 MG PO TABS
200.0000 mg | ORAL_TABLET | Freq: Every day | ORAL | Status: DC
  Administered 2021-06-30: 200 mg
  Filled 2021-06-30: qty 2

## 2021-06-30 MED ORDER — VANCOMYCIN HCL 1250 MG/250ML IV SOLN
1250.0000 mg | INTRAVENOUS | Status: DC
Start: 2021-06-30 — End: 2021-06-30
  Filled 2021-06-30: qty 250

## 2021-06-30 MED ORDER — OSMOLITE 1.5 CAL PO LIQD
1000.0000 mL | ORAL | Status: DC
Start: 2021-06-30 — End: 2021-07-17
  Administered 2021-06-30 – 2021-07-02 (×3): 1000 mL
  Filled 2021-06-30 (×20): qty 1000

## 2021-06-30 MED ORDER — TRAMADOL HCL 50 MG PO TABS
50.0000 mg | ORAL_TABLET | ORAL | Status: DC | PRN
  Administered 2021-07-02: 100 mg
  Administered 2021-07-08 – 2021-07-09 (×3): 50 mg
  Administered 2021-07-14 – 2021-07-17 (×2): 100 mg
  Filled 2021-06-30: qty 2
  Filled 2021-06-30 (×2): qty 1
  Filled 2021-06-30 (×2): qty 2
  Filled 2021-06-30: qty 1

## 2021-06-30 MED ORDER — BENZTROPINE MESYLATE 1 MG PO TABS: 1.0000 mg | ORAL_TABLET | Freq: Every day | ORAL | Status: DC

## 2021-06-30 MED ORDER — INSULIN ASPART 100 UNIT/ML IJ SOLN: 0.0000 [IU] | Freq: Four times a day (QID) | INTRAMUSCULAR | Status: DC

## 2021-06-30 MED ORDER — POTASSIUM CHLORIDE 10 MEQ/100ML IV SOLN
10.0000 meq | INTRAVENOUS | Status: AC
  Administered 2021-06-30 (×2): 10 meq via INTRAVENOUS
  Filled 2021-06-30 (×2): qty 100

## 2021-06-30 MED ORDER — NAFCILLIN SODIUM 2 G IJ SOLR
12.0000 g | INTRAMUSCULAR | Status: DC
  Administered 2021-06-30 – 2021-07-16 (×18): 12 g via INTRAVENOUS
  Filled 2021-06-30 (×21): qty 48

## 2021-06-30 MED ORDER — OXYCODONE HCL 5 MG PO TABS: 5.0000 mg | ORAL_TABLET | ORAL | Status: DC | PRN

## 2021-06-30 MED ORDER — ASPIRIN 81 MG PO CHEW
81.0000 mg | CHEWABLE_TABLET | Freq: Every day | ORAL | Status: DC
  Administered 2021-07-01 – 2021-07-08 (×8): 81 mg
  Filled 2021-06-30 (×9): qty 1

## 2021-06-30 MED ORDER — ACETAMINOPHEN 160 MG/5ML PO SOLN
1000.0000 mg | Freq: Four times a day (QID) | ORAL | Status: DC
Start: 2021-06-30 — End: 2021-06-30
  Administered 2021-06-30 (×2): 1000 mg via ORAL
  Filled 2021-06-30 (×3): qty 40.6

## 2021-06-30 MED ORDER — BENZTROPINE MESYLATE 1 MG PO TABS
1.0000 mg | ORAL_TABLET | Freq: Every day | ORAL | Status: DC
Start: 2021-06-30 — End: 2021-07-09
  Administered 2021-06-30 – 2021-07-08 (×9): 1 mg
  Filled 2021-06-30 (×10): qty 1

## 2021-06-30 MED ORDER — INSULIN ASPART 100 UNIT/ML IJ SOLN
0.0000 [IU] | Freq: Four times a day (QID) | INTRAMUSCULAR | Status: DC
  Administered 2021-06-30 – 2021-07-09 (×10): 2 [IU] via SUBCUTANEOUS

## 2021-06-30 MED ORDER — PROSOURCE TF PO LIQD
45.0000 mL | Freq: Three times a day (TID) | ORAL | Status: DC
  Administered 2021-06-30 – 2021-07-05 (×13): 45 mL
  Filled 2021-06-30 (×14): qty 45

## 2021-06-30 MED ORDER — ORAL CARE MOUTH RINSE
15.0000 mL | OROMUCOSAL | Status: DC
  Administered 2021-06-30 – 2021-07-17 (×55): 15 mL via OROMUCOSAL

## 2021-06-30 MED ORDER — ENOXAPARIN SODIUM 40 MG/0.4ML IJ SOSY: 40.0000 mg | PREFILLED_SYRINGE | INTRAMUSCULAR | Status: DC

## 2021-06-30 NOTE — Progress Notes (Signed)
[facility] Progress Note Patient Name: Debra Klein DOB: 01-11-1976 MRN: [id number]   Date of Service  06/30/2021  HPI/Events of Note  Patient given night time antipsychotics and cogentin and now marked decreased LOC.  Maintaining oxygenation and ventilation presently.    eICU Interventions  Start bipap Hold sedating medications Follow neuro exam     Intervention Category Major Interventions: Change in mental status - evaluation and management  Mauri Brooklyn, P 06/30/2021, 11:25 PM

## 2021-06-30 NOTE — Progress Notes (Incomplete)
Gave patient scheduled 2200 meds patient was alert with pupils 3 round and brisk. 20 minutes later patient respiratory rate elevated, patient only responsive to yankauer in back of throat, all four extremities flacid, and pupils pinpoint very sluggish. E-link notified, orders for STAT abg, Bmet, and BiPap. See results review. Tube feeds stopped per MD.

## 2021-06-30 NOTE — Progress Notes (Addendum)
[facility] for Infectious Disease   Reason for visit: Follow up on endocarditis, s/p MV repair  Interval History:  Staph epi on mv tissue culture still pending susceptibility Spoke with micro -- only 1 sample sent for culture and both the plate and maldi c/w staph epi not mssa Patient doing much better per her mother report Stable dense right hemiplegia and global aphasia No f/c   Physical Exam: Constitutional:  Vitals:   06/30/21 1100 06/30/21 1200  BP:  132/80  Pulse: 81 82  Resp: (!) 25 (!) 25  Temp:    SpO2: 100% 98%  General/constitutional: no distress, pleasant; minimal verbal; some interaction HEENT: Normocephalic, PER, Conj Clear, EOMI, Oropharynx clear Neck supple CV: rrr no mrg Lungs: clear to auscultation, normal respiratory effort Abd: Soft, Nontender Ext: no edema Skin: No Rash Neuro: right hemiplegia   Labs: Lab Results  Component Value Date   WBC 30.8 (H) 06/30/2021   HGB 9.4 (L) 06/30/2021   HCT 28.5 (L) 06/30/2021   MCV 84.8 06/30/2021   PLT 333 54/62/7035   Last metabolic panel Lab Results  Component Value Date   GLUCOSE 118 (H) 06/30/2021   NA 138 06/30/2021   K 3.5 06/30/2021   CL 111 06/30/2021   CO2 19 (L) 06/30/2021   BUN 16 06/30/2021   CREATININE 1.05 (H) 06/30/2021   GFRNONAA >60 06/30/2021   CALCIUM 8.4 (L) 06/30/2021   PHOS 3.4 06/27/2021   PROT 5.9 (L) 06/30/2021   ALBUMIN 2.0 (L) 06/30/2021   BILITOT 1.2 06/30/2021   ALKPHOS 48 06/30/2021   AST 24 06/30/2021   ALT 6 06/30/2021   ANIONGAP 8 06/30/2021     Imaging: Reviewed and incorporated in to decision making  6/19 cxr FINDINGS: Status post median sternotomy and aortic valve replacement. RIGHT-sided Swan-Ganz catheter has been removed. RIGHT IJ sheath remains in place. LEFT-sided chest tube has been removed. Nasogastric tube and mediastinal drain also removed. There is no pneumothorax. Focal areas of atelectasis seen at both lung bases. Possible small  RIGHT pleural effusion versus atelectasis. No evidence for pulmonary edema.   IMPRESSION: Bibasilar atelectasis. Small RIGHT pleural effusion versus atelectasis.     Abx: 6/16-c vanc  6/09-18 nafcillin  Impression:  46 yo female schizophrenia, hx admitted 6/08 after several ground level falls and with left hemispheric stroke, found to have with mssa mv endocarditis,   #MV endocarditis s/p repair 6/16 6/08 bcx mssa 6/10 bcx negative 6/12 left knee fluid cx mssa Tee showed large 1.4x1.3 cm mobile mass posterior mitral valve leafelet "PROCEDURE:  Median sternotomy, extracorporeal circulation, mitral valve repair with posterior leaflet resection and pericardial patch, 16 mm Gore-Tex cords, and 28 mm Med[employer]onic SimuForm annuloplasty ring." 6/16 MV tissue staph epi (confirmed with lab -- culture plate and maldi c/w staph epi) Suspect it was mssa endocarditis all along, but then might have been seeded with staph epi this admission. Regardless will need to treat as staph epi   #stroke CTH on 6/8 showed loss of gray white matter alone with insula concerning for infarction CT on head neck 6/9 showed left MCA branch in the insula. No embolic source in the eck identified Concern for septic brain emboli    #left knee septic arthritis Mri 6/10 left knee showed septic arthritis and periarticular osteomyelitis with soft tissue abscesses S/p diagnostic arthrocentesis 6/12 cx showed mssa S/p on 6/13 left knee arthroscopy I&D and synovectomy -- no gross purulence found    Plan: 1.  Continue vancomycin  for now 2.  Plan 6 weeks iv vanc and then will do prolonged course PO abx suppression given prosthetic material with mv repair 3.  F/u staph epi culture sensitivity     I spent more than 50 minute reviewing data/chart, and coordinating care and >50% direct face to face time providing counseling/discussing diagnostics/treatment plan with patient\

## 2021-06-30 NOTE — Progress Notes (Signed)
   06/30/21 1050  Clinical Encounter Type  Visited With Patient  Visit Type Initial  Referral From Other (Comment) (Rounding)  Consult/Referral To Chaplain Albertina Parr Ada)  Recommendations Rounding   Shared presence and moment of silence with Jiah. [address] [place], Ivin Poot., [phone]

## 2021-06-30 NOTE — Progress Notes (Signed)
Gave patient scheduled 2200 meds patient was alert with pupils 3 round and brisk. 20 minutes later patient respiratory rate elevated, patient only responsive to yankauer in back of throat, all four extremities flacid, and pupils pinpoint very sluggish. E-link notified, orders for STAT abg, Bmet, and BiPap. See results review. Tube feeds stopped per MD.

## 2021-06-30 NOTE — Progress Notes (Signed)
Nutrition Follow-up  DOCUMENTATION CODES:   Not applicable  INTERVENTION:   Initiate tube feeds via Cortrak: - Start Osmolite 1.5 @ 20 ml/hr and advance by 10 ml q 4 hours to goal rate of 55 ml/hr (1320 ml/day) - ProSource TF 45 ml TID  Tube feeding regimen at goal rate provides 2100 kcal, 116 grams of protein, and 1006 ml of H2O.   NUTRITION DIAGNOSIS:   Increased nutrient needs related to (endocarditis) as evidenced by estimated needs.  Ongoing, being addressed via TF  GOAL:   Patient will meet greater than or equal to 90% of their needs  Progressing with advancement of TF to goal  MONITOR:   Diet advancement, Labs, Weight trends, TF tolerance, Skin  REASON FOR ASSESSMENT:   Consult Enteral/tube feeding initiation and management  ASSESSMENT:   Pt with PMH of schizophrenia, HTN, and anemia admitted from home where she lives with her mother with embolic CVA and MSSA bacteremia due to suspected endocarditis.  06/13 - s/p L knee arthroscopy with I&D, TEE  06/14 - s/p Cortrak placement (tip distal stomach) 06/15 - extubated, not ready for SLP evaluation, TF off at midnight for OR 06/16 - s/p mitral valve repair, Cortrak removed 06/18 - extubated, SLP recommending NPO 06/19 - Cortrak placed (tip in duodenal bulb)  Cortrak replaced today, tip in duodenal bulb. Discussed pt with CCM. Okay to resume tube feeds. Pt has not received tube feeds since evening of 6/15. Will restart at trickle rate and advance to goal. Discussed plan with RN.  Admit weight: 81.9 kg Current weight: 100.6 kg  Pt with moderate pitting edema to BUE and BLE.  Medications reviewed and include: dulcolax, IV lasix, SSI q 6 hours, IV protonix, vitamin B12 1000 mcg daily, amiodarone drip, dopamine drip, milrinone drip, IV KCl, IV abx  Labs reviewed: potassium 3.5, creatinine 1.05, WBC 30.8, hemoglobin 9.4, INR 3.0 CBG's: 63-144 x 24 hours  UOP: 1785 ml x 24 hours Rectal tube: 200 ml x 24  hours Chest tubes (now removed): 190 ml x 24 hours I/O's: +10.7 L since admit  Diet Order:   Diet Order             Diet NPO time specified  Diet effective now                   EDUCATION NEEDS:   No education needs have been identified at this time  Skin:  Skin Assessment: Skin Integrity Issues: Stage II: medial buttocks Incisions: L leg, chest Other: knee wound from recent fall  Last BM:  06/30/21 200 ml x 24 hours via rectal tube  Height:   Ht Readings from Last 1 Encounters:  06/27/21 5\' 6"  (1.676 m)    Weight:   Wt Readings from Last 1 Encounters:  06/30/21 100.6 kg    BMI:  Body mass index is 35.8 kg/m.  Estimated Nutritional Needs:   Kcal:  2000-2200  Protein:  115-130 grams  Fluid:  >2L/day    Gustavus Bryant, MS, RD, LDN Inpatient Clinical Dietitian Please see AMiON for contact information.

## 2021-06-30 NOTE — Progress Notes (Signed)
3 Days Post-Op Procedure(s) (LRB): MITRAL VALVE REPAIR USING 28MM SIMUFORM SEMI-RIGID ANNULOPLASTY RING (N/A) TRANSESOPHAGEAL ECHOCARDIOGRAM (TEE) (N/A) Subjective: Alert, responsive to questions, difficult to understand  Objective: Vital signs in last 24 hours: Temp:  [97.5 F (36.4 C)-99.5 F (37.5 C)] 98.6 F (37 C) (06/19 0400) Pulse Rate:  [80-92] 86 (06/19 0700) Cardiac Rhythm: Normal sinus rhythm (06/19 0700) Resp:  [14-30] 23 (06/19 0700) BP: (100-121)/(52-71) 116/71 (06/19 0700) SpO2:  [95 %-100 %] 99 % (06/19 0700) Arterial Line BP: (83-132)/(42-71) 117/63 (06/19 0700) FiO2 (%):  [40 %] 40 % (06/18 0800) Weight:  [100.6 kg] 100.6 kg (06/19 0449)  Hemodynamic parameters for last 24 hours: PAP: (36-42)/(8-22) 36/8 CVP:  [13 mmHg-14 mmHg] 14 mmHg  Intake/Output from previous day: 06/18 0701 - 06/19 0700 In: 1939.4 [I.V.:655.2; IV Piggyback:1284.2] Out: 2205 [Urine:1785; Emesis/NG output:30; Stool:200; Chest Tube:190] Intake/Output this shift: No intake/output data recorded.  General appearance: alert, cooperative, and slowed mentation Neurologic: right hemiparesis Heart: regular rate and rhythm Lungs: diminished breath sounds bibasilar Abdomen: normal findings: soft, non-tender  Lab Results: Recent Labs    06/29/21 0410 06/30/21 0417  WBC 33.6* 30.8*  HGB 9.6* 9.4*  HCT 28.0* 28.5*  PLT 269 333   BMET:  Recent Labs    06/29/21 1452 06/30/21 0417  NA 141 138  K 2.9* 3.5  CL 112* 111  CO2 19* 19*  GLUCOSE 122* 118*  BUN 15 16  CREATININE 0.99 1.05*  CALCIUM 8.3* 8.4*    PT/INR:  Recent Labs    06/30/21 0417  LABPROT 32.7*  INR 3.2*   ABG    Component Value Date/Time   PHART 7.351 06/28/2021 0401   HCO3 22.8 06/28/2021 0401   TCO2 24 06/28/2021 0401   ACIDBASEDEF 3.0 (H) 06/28/2021 0401   O2SAT 67.7 06/30/2021 0425   CBG (last 3)  Recent Labs    06/29/21 2347 06/30/21 0014 06/30/21 0430  GLUCAP 63* 144* 113*     Assessment/Plan: S/P Procedure(s) (LRB): MITRAL VALVE REPAIR USING 28MM SIMUFORM SEMI-RIGID ANNULOPLASTY RING (N/A) TRANSESOPHAGEAL ECHOCARDIOGRAM (TEE) (N/A) - Overall looks as well as could be expected NEURO/ PSYCH- s/p CVA. Right hemiparesis, moving left side but not really following commands this AM. Some limited speech but very soft voice difficult to understand  PT/OT/Speech  CV- in SR, co-ox 67- will wean dopamine and milrinone  Change ASA to 81 mg  Consider short term warfarin  ID- antibiotic changed form nafcillin to Vancomycin by ID for staph epi in tissue culture  RESP- atelectasis- IS limited, will try flutter valve  RENAL- creatinine 1.05, fluid overload- diurese as BP allows  ENDO- CBG low last night, OK this AM- change to Q6 hours  GI- benign exam- unable to take PO, needs feeding tube replaced  HEME- anemia- acute secondary to ABL on chronic- stable  INR elevated- ? Accuracy- will repeat  SCD + enoxaparin for DVT prophylaxis- pending repeat INR  Deconditioning- PT/OT   LOS: 11 days    Melrose Nakayama 06/30/2021

## 2021-06-30 NOTE — Procedures (Signed)
Cortrak  Person Inserting Tube:  Ranell Patrick D, RD Tube Type:  Cortrak - 43 inches Tube Size:  10 Tube Location:  Left nare Secured by: Bridle Technique Used to Measure Tube Placement:  Marking at nare/corner of mouth Cortrak Secured At:  70 cm   Cortrak Tube Team Note:  Consult received to place a Cortrak feeding tube. INR elevated, ok to place per attending.  X-ray is required, abdominal x-ray has been ordered by the Cortrak team. Please confirm tube placement before using the Cortrak tube.   If the tube becomes dislodged please keep the tube and contact the Cortrak team at [email or web address] (password [email or web address]) for replacement.  If after hours and replacement cannot be delayed, place a NG tube and confirm placement with an abdominal x-ray.    Ranell Patrick, RD, LDN Clinical Dietitian RD pager # available in [facility]  After hours/weekend pager # available in [facility]

## 2021-06-30 NOTE — Progress Notes (Signed)
Progress Note  Patient Name: Debra Klein Date of Encounter: 06/30/2021  [facility] Cardiologist: [name], MD   Subjective   Resting comfortably, post mitral valve repair, endocarditis.  She did state that she "needed to use the bathroom"  Inpatient Medications    Scheduled Meds:  aspirin  81 mg Oral Daily   bisacodyl  10 mg Oral Daily   Or   bisacodyl  10 mg Rectal Daily   Chlorhexidine Gluconate Cloth  6 each Topical Daily   cloZAPine  100 mg Per Tube Daily   enoxaparin (LOVENOX) injection  40 mg Subcutaneous Q24H   fluPHENAZine  2.5 mg Per Tube QHS   furosemide  40 mg Intravenous BID   insulin aspart  0-24 Units Subcutaneous Q6H   metoprolol tartrate  12.5 mg Oral BID   Or   metoprolol tartrate  12.5 mg Per Tube BID   mouth rinse  15 mL Mouth Rinse Q2H   pantoprazole (PROTONIX) IV  40 mg Intravenous Daily   sodium chloride flush  10-40 mL Intracatheter Q12H   sodium chloride flush  3 mL Intravenous Q12H   vitamin B-12  1,000 mcg Per Tube Daily   Continuous Infusions:  sodium chloride Stopped (06/27/21 2102)   sodium chloride     sodium chloride 10 mL/hr at 06/30/21 0700   acetaminophen 400 mL/hr at 06/30/21 0700   amiodarone 30 mg/hr (06/30/21 0700)   DOPamine 2.5 mcg/kg/min (06/30/21 0700)   lactated ringers     lactated ringers     milrinone 0.125 mcg/kg/min (06/30/21 0700)   potassium chloride     potassium chloride 10 mEq (06/30/21 0813)   vancomycin     PRN Meds: sodium chloride, dextrose, fentaNYL (SUBLIMAZE) injection, ipratropium-albuterol, metoprolol tartrate, ondansetron (ZOFRAN) IV, mouth rinse, oxyCODONE, sodium chloride flush, sodium chloride flush, traMADol, white petrolatum   Vital Signs    Vitals:   06/30/21 0449 06/30/21 0500 06/30/21 0600 06/30/21 0700  BP:  107/71 116/69 116/71  Pulse:  85 85 86  Resp:  (!) 26 (!) 22 (!) 23  Temp:      TempSrc:      SpO2:  97% 98% 99%  Weight: 100.6 kg     Height:         Intake/Output Summary (Last 24 hours) at 06/30/2021 0821 Last data filed at 06/30/2021 0700 Gross per 24 hour  Intake 1939.36 ml  Output 1905 ml  Net 34.36 ml      06/30/2021    4:49 AM 06/29/2021    5:00 AM 06/28/2021    6:17 AM  Last 3 Weights  Weight (lbs) 221 lb 12.5 oz 226 lb 6.6 oz 216 lb 4.3 oz  Weight (kg) 100.6 kg 102.7 kg 98.1 kg      Telemetry    Sinus rhythm previous atrial pacing- Personally Reviewed  ECG    No new- Personally Reviewed  Physical Exam   GEN: No acute distress.  Challenging to follow commands.  Post drug. Neck: No JVD Cardiac: RRR, no murmurs, rubs, or gallops.  Postsurgical wounds Respiratory: Clear to auscultation bilaterally. GI: Soft, nontender, non-distended  MS: No edema; No deformity. Neuro: Right-sided hemiparesis, wide-eyed Psych: Challenging to assess  Labs    High Sensitivity Troponin:  No results for input(s): "TROPONINIHS" in the last 720 hours.   Chemistry Recent Labs  Lab 06/27/21 0400 06/27/21 0939 06/28/21 1717 06/29/21 0410 06/29/21 1452 06/30/21 0417  NA 143   < > 142 139 141 138  K  3.4*   < > 3.0* 3.1* 2.9* 3.5  CL 112*   < > 113* 112* 112* 111  CO2 25   < > 22 21* 19* 19*  GLUCOSE 101*   < > 135* 135* 122* 118*  BUN 15   < > 13 14 15 16   CREATININE 0.89   < > 0.81 0.82 0.99 1.05*  CALCIUM 8.3*   < > 8.2* 8.1* 8.3* 8.4*  MG 2.3   < > 2.2 2.1  --  2.0  PROT 7.0  --   --   --   --  5.9*  ALBUMIN 1.5*  --   --   --   --  2.0*  AST 19  --   --   --   --  24  ALT 20  --   --   --   --  6  ALKPHOS 57  --   --   --   --  48  BILITOT 0.8  --   --   --   --  1.2  GFRNONAA >60   < > >60 >60 >60 >60  ANIONGAP 6   < > 7 6 10 8    < > = values in this interval not displayed.    Lipids No results for input(s): "CHOL", "TRIG", "HDL", "LABVLDL", "LDLCALC", "CHOLHDL" in the last 168 hours.  Hematology Recent Labs  Lab 06/28/21 1717 06/29/21 0410 06/30/21 0417  WBC 27.6* 33.6* 30.8*  RBC 3.38* 3.37* 3.36*  HGB  9.8* 9.6* 9.4*  HCT 28.3* 28.0* 28.5*  MCV 83.7 83.1 84.8  MCH 29.0 28.5 28.0  MCHC 34.6 34.3 33.0  RDW 17.6* 17.8* 18.7*  PLT 236 269 333   Thyroid No results for input(s): "TSH", "FREET4" in the last 168 hours.  BNPNo results for input(s): "BNP", "PROBNP" in the last 168 hours.  DDimer No results for input(s): "DDIMER" in the last 168 hours.   Radiology    DG Chest Port 1 View  Result Date: 06/30/2021 CLINICAL DATA:  Altered mental status.  Sepsis.  Status post MVR. EXAM: PORTABLE CHEST 1 VIEW COMPARISON:  06/29/2021 FINDINGS: Status post median sternotomy and aortic valve replacement. RIGHT-sided Swan-Ganz catheter has been removed. RIGHT IJ sheath remains in place. LEFT-sided chest tube has been removed. Nasogastric tube and mediastinal drain also removed. There is no pneumothorax. Focal areas of atelectasis seen at both lung bases. Possible small RIGHT pleural effusion versus atelectasis. No evidence for pulmonary edema. IMPRESSION: Bibasilar atelectasis. Small RIGHT pleural effusion versus atelectasis. Electronically Signed   By: Nolon Nations M.D.   On: 06/30/2021 08:00   DG Chest Port 1 View  Result Date: 06/29/2021 CLINICAL DATA:  Respiratory failure. EXAM: PORTABLE CHEST 1 VIEW COMPARISON:  06/28/2021 FINDINGS: Enteric tube courses into the stomach and off the film as tip is not visualized. Endotracheal tube unchanged. Right IJ Swan-Ganz catheter has tip over the main pulmonary artery segment. Mediastinal drain and bilateral chest tubes unchanged. Lungs are hypoinflated with improving mild bibasilar atelectasis. No effusion or pneumothorax. Cardiomediastinal silhouette and remainder of the exam is unchanged. IMPRESSION: 1. Hypoinflation with improving mild bibasilar atelectasis. 2. Tubes and lines as described. Electronically Signed   By: Marin Olp M.D.   On: 06/29/2021 08:47   DG Abd 1 View  Result Date: 06/28/2021 CLINICAL DATA:  OG tube placement EXAM: ABDOMEN - 1 VIEW  COMPARISON:  Radiographs dated June 25, 2021 FINDINGS: OG tube tip and side port project over the expected  area of the stomach. No gas-filled dilated loops of bowel seen in the visualized abdomen. Partially visualized lung bases demonstrate a PA catheter and left-sided chest tube. IMPRESSION: OG tube tip and side port project over the expected area of the stomach. Electronically Signed   By: Yetta Glassman M.D.   On: 06/28/2021 12:24    Assessment & Plan    46 year old female with MSSA endocarditis status post mitral valve repair, 28 mm annuloplasty ring posterior leaflet reconstruction with pericardial patch, neocords, status post stroke right-sided hemiparesis, sepsis, MSSA bacteremia  Stroke secondary from bacterial endocarditis - Right-sided hemiparesis.  Moving the left side.  Difficulty following commands.  Very limited speech.  Feeding tube to be placed.  Mitral valve repair/sepsis - Stable.  Currently weaning both dopamine and milrinone. -Dr. Roxan Hockey is considering short-term warfarin.  Aspirin is now 81 mg. -Infectious disease following.  Staph epidermidis.  Septic knee/abscess.  Status post arthroscopy debridement.  Orthopedics previously following.  Schizophrenia - Per primary team  Currently on amiodarone IV.  Currently sinus rhythm.  Consider discontinuation soon.  Dopamine weaning.   No new cardiology recommendations at this time.  Please let us know if we can be of further assistance.  We will go ahead and sign off.      For questions or updates, please contact [facility] Please consult [email or web address] for contact info under        Signed, Candee Furbish, MD  06/30/2021, 8:21 AM

## 2021-06-30 NOTE — Progress Notes (Signed)
      [address]       [place] [address]             [phone]      Back in bed after being up in chair earlier  BP 132/75 (BP Location: Right Arm)   Pulse 80   Temp 97.9 F (36.6 C) (Oral)   Resp (!) 34   Ht 5\' 6"  (1.676 m)   Wt 100.6 kg   LMP  (LMP Unknown) Comment: negative urine pregnancy test 06/19/21  SpO2 97%   BMI 35.80 kg/m   Intake/Output Summary (Last 24 hours) at 06/30/2021 1815 Last data filed at 06/30/2021 1700 Gross per 24 hour  Intake 3117.36 ml  Output 1750 ml  Net 1367.36 ml   New feeding tube placed  Continue current Rx.  Revonda Standard Roxan Hockey, MD [facility] [phone]

## 2021-06-30 NOTE — Progress Notes (Signed)
NAME:  Debra Klein, MRN:  [id number], DOB:  [birth year], LOS: 49 ADMISSION DATE:  06/19/2021, CONSULTATION DATE:  06/19/21 REFERRING MD:  Laurena Spies, CHIEF COMPLAINT:  respiratory failure   History of Present Illness:  [name] is a 46 year old woman with a history of schizophrenia who presented to the hospital with acute encephalopathy. History obtained through chart review:   Family reports that about 3 weeks ago the patient started to be less verbal, but was still managing her medications. They were noticing for the last few days they would find a pill or 2 dropped on the floor although she was still overall taking her medications.  Her verbal output was actually improving in the last week or so, but then acutely she became completely nonverbal and so they came to the ED for further evaluation.  They report she did not have any other complaints and did not notice any fevers or any physical issues other than left leg swelling and pain   Per chart review, she stopped taking her medications approximately 1 week ago, had a fall on 06/15/2021 with left ankle swelling, became nonverbal on 6/6, and subsequently had another fall on 6/7 found to have a left lower leg fracture and discharged with a cam walker boot after radiographs of her left ankle and tibia/fibula revealed previous intramedullary rodding and rod removal of the left tibia without clear evidence of acute fracture.  Subsequently she fell again on 6/8 and was found to be febrile (101.2), tachycardic to 110s, noted to be nonverbal, not following commands, frequently dozing off but withdrawing in all 4 extremities without a facial droop.  She was started on vancomycin, ceftriaxone, acyclovir for meningitis coverage.  Additionally, per hospitalist admission note LP was attempted but unsuccessful at bedside.  Subsequently at approximately 9 PM the patient had a change in mental status (drooling) with gaze deviation to the left and was intubated. PCCM  consulted. Head CT demonstrating an insular hypodensity concerning for MCA infarct.   Pertinent  Medical History  Schizophrenia HTN anemia  Significant Hospital Events: Including procedures, antibiotic start and stop dates in addition to other pertinent events   6/7: Fall --> fibula fracture, Minimally verbal at an outpatient visit with [facility]  6/8: Presented to the ED nonverbal with altered mentation. Progressively worsening mentation with requirement of intubation for airway protection.  Empirically treated for sepsis, presumed meningitis. MRI with large MCA infarct and punctate right hemisphere infarcts.  6/9: Blood cultures obtained on arrival positive for MSSA  6/10 MRI knee demonstrates septic arthritis.  6/13 L knee arthroscopy with I&D with snyovectomy for septic arthritis, TEE 6/14 steroids for no cuff leak 6/15 extubated, much more alert 6/16 mitral valve repair. Left chest tube placed for pleural effusion  6/17 too weak to come off vent. Not following commands.  6/18 extubated  Interim History / Subjective:  Patient was successfully extubated yesterday On 3 L oxygen via nasal cannula Remains globally aphasic  Objective   Blood pressure 116/71, pulse 86, temperature 98.6 F (37 C), temperature source Axillary, resp. rate (!) 23, height 5\' 6"  (1.676 m), weight 100.6 kg, SpO2 99 %. PAP: (36-42)/(8-22) 36/8 CVP:  [13 mmHg-14 mmHg] 14 mmHg  FiO2 (%):  [40 %] 40 %   Intake/Output Summary (Last 24 hours) at 06/30/2021 0749 Last data filed at 06/30/2021 0700 Gross per 24 hour  Intake 1939.36 ml  Output 2205 ml  Net -265.64 ml   Filed Weights   06/28/21 0617 06/29/21 0500 06/30/21 0449  Weight: 98.1 kg 102.7 kg 100.6 kg   Examination: Physical exam: General: Acute on chronically ill-appearing female, lying on the bed HEENT: [place]/AT, eyes anicteric.  moist mucus membranes Neuro: Awake, globally aphasic, right hemiparesis.  Tracking examiner Chest: Coarse breath sounds,  no wheezes or rhonchi Heart: Regular rate and rhythm, no murmurs or gallops Abdomen: Soft, nontender, nondistended, bowel sounds present Skin: No rash   Resolved issues  AKI 2/2 MSSA bacteremia  Acute respiratory failure  Assessment & Plan:  Acute left MCA stroke due to septic emboli from MSSA/staph epi mitral valve endocarditis Disseminated MSSA infection, leading to bacteremia and septic arthritis s/p left knee washout  MSSA bacteremia, acute native MV endocarditis with staph epi s/p Mitral Valve repair Appreciate Ortho & [facility]'s management for source control Continue aspirin Post op per surg Infectious disease following, nafcillin was switched to vancomycin considering surgical tissue culture showed staph epi White count started trending down from 33 to 30 today Follow-up sensitivity data and adjust antibiotic accordingly Continue neuro watch  Post-operative respiratory failure 2/2 left sided atelectasis and further complicated by small left effusion  Patient was successfully extubated yesterday Remains on 3 L nasal cannula oxygen Encourage incentive spirometry and flutter valve Titrate oxygen with O2 sat goal 92%  Cardiogenic shock s/p MVR [place] todays 57% She does look volume overloaded Continue aggressive diuresis Titrate inotropic and dobutamine  Schizophrenia, prominent negative symptoms  Cont Fluphenazine and Clozapine  Acute metabolic encephalopathy due to sepsis & stroke, may also have prominent negative symptoms from schizophrenia-- Mental status has improved, she has global aphasia now from a stroke Cont Clozapine, and Prolixin  Cont neuro-protective interventions  Hypokalemia Continue aggressive electrolyte supplement and monitor  Anemia due to critical illness H&H remained stable Intermittent CBC; trigger for transfusion <7  Prediabetes Patient hemoglobin A1c 6.1 Continue sliding scale insulin CBG: 40-1 80  Best Practice (right click and "Reselect all  SmartList Selections" daily)   Per primary team    Total critical care time: 33 minutes  Performed by: Jacky Kindle   Critical care time was exclusive of separately billable procedures and treating other patients.   Critical care was necessary to treat or prevent imminent or life-threatening deterioration.   Critical care was time spent personally by me on the following activities: development of treatment plan with patient and/or surrogate as well as nursing, discussions with consultants, evaluation of patient's response to treatment, examination of patient, obtaining history from patient or surrogate, ordering and performing treatments and interventions, ordering and review of laboratory studies, ordering and review of radiographic studies, pulse oximetry and re-evaluation of patient's condition.   Jacky Kindle, MD [facility] See Amion for pager If no response to pager, please call [phone] until 7pm After 7pm, Please call E-link [phone]

## 2021-06-30 NOTE — Progress Notes (Signed)
Physical Therapy Treatment Patient Details Name: Debra Klein MRN: [id number] DOB: 05/02/1975 Today's Date: 06/30/2021   History of Present Illness 46 y.o. female admitted 06/19/2021 with AMS, poor PO intake and recent falls, one resulting in L ankle fx. Pt with severe sepsis, septic arthritis of L knee, s/p Left knee arthroscopy with debridement and synovectomy 6/13. MRI 6/8 shows large L MCA infarct. Pt required intubation 6/8 - 6/14. S/p MVR 6/16 with extubation 6/18. PMHx: schizophrenia, HTN, anemia.    PT Comments    Pt with eyes open, limited tracking and able to progress to sitting EOB. Pt with automatic movement of LUE with limited command following for movement of LUE with delay and unable to squeeze hand. Pt educated for sternal precaution and progression. Automatic speech for voicing need to toilet but unable to respond to questions. Will continue to follow and encouraged lift for OOB. Pt in bed for cortrack placement end of session.   96% on 3L  HR 82    Recommendations for follow up therapy are one component of a multi-disciplinary discharge planning process, led by the attending physician.  Recommendations may be updated based on patient status, additional functional criteria and insurance authorization.  Follow Up Recommendations  Acute inpatient rehab (3hours/day)     Assistance Recommended at Discharge Frequent or constant Supervision/Assistance  Patient can return home with the following Two people to help with walking and/or transfers;Two people to help with bathing/dressing/bathroom;Assistance with cooking/housework;Assistance with feeding;Direct supervision/assist for medications management;Direct supervision/assist for financial management;Assist for transportation;Help with stairs or ramp for entrance   Equipment Recommendations  Hospital bed;Wheelchair (measurements PT);Wheelchair cushion (measurements PT)    Recommendations for Other Services       Precautions  / Restrictions Precautions Precautions: Fall;Sternal Precaution Booklet Issued: No Precaution Comments: flexiseal, getting cortrak, aline Restrictions Weight Bearing Restrictions: Yes (sternal precautions) LLE Weight Bearing: Weight bearing as tolerated     Mobility  Bed Mobility Overal bed mobility: Needs Assistance Bed Mobility: Rolling, Sidelying to Sit, Sit to Sidelying Rolling: Max assist Sidelying to sit: Max assist, +2 for physical assistance   Sit to supine: Max assist, +2 for safety/equipment   General bed mobility comments: pt reaching with LUE with assist to grossl body to roll and trying to utilize rail to control sit and lower with LUE. Max cues for sequence, safety and precautions    Transfers Overall transfer level: Needs assistance   Transfers: Sit to/from Stand Sit to Stand: Max assist, +2 physical assistance           General transfer comment: max +2 with pad and bil knees blocked to rise to standing x 2 trials, buckling on RLE with pt maintaining extension on LLE in standing, unable to weight shift with right lean and flexed trunk    Ambulation/Gait               General Gait Details: unable   Stairs             Wheelchair Mobility    Modified Rankin (Stroke Patients Only) Modified Rankin (Stroke Patients Only) Pre-Morbid Rankin Score: No symptoms Modified Rankin: Severe disability     Balance Overall balance assessment: Needs assistance Sitting-balance support: No upper extremity supported, Feet supported Sitting balance-Leahy Scale: Fair Sitting balance - Comments: static sitting 13 min EOb with minguard for safety with pt able to maintain sitting without physical assist  Cognition Arousal/Alertness: Awake/alert Behavior During Therapy: Flat affect Overall Cognitive Status: Difficult to assess Area of Impairment: Attention, Following commands                    Current Attention Level: Focused         Problem Solving: Slow processing General Comments: pt with visual tracking and able to perform righting responses with LUE with limited command following for movement grossly 15% of the time with moving LUE for functional mobility, no AROM noted other than slight toe wiggle on LLE. pt able to state "i need to use the bathroom"  but could not responsd to question of pee or poop        Exercises      General Comments        Pertinent Vitals/Pain Pain Assessment Pain Assessment: CPOT Facial Expression: Relaxed, neutral Body Movements: Absence of movements Muscle Tension: Relaxed Compliance with ventilator (intubated pts.): N/A Vocalization (extubated pts.): Talking in normal tone or no sound CPOT Total: 0    Home Living                          Prior Function            PT Goals (current goals can now be found in the care plan section) Progress towards PT goals: Progressing toward goals    Frequency    Min 4X/week      PT Plan Current plan remains appropriate    Co-evaluation              AM-PAC PT "6 Clicks" Mobility   Outcome Measure  Help needed turning from your back to your side while in a flat bed without using bedrails?: Total Help needed moving from lying on your back to sitting on the side of a flat bed without using bedrails?: Total Help needed moving to and from a bed to a chair (including a wheelchair)?: Total Help needed standing up from a chair using your arms (e.g., wheelchair or bedside chair)?: Total Help needed to walk in hospital room?: Total Help needed climbing 3-5 steps with a railing? : Total 6 Click Score: 6    End of Session Equipment Utilized During Treatment: Oxygen Activity Tolerance: Patient tolerated treatment well Patient left: in bed;with call bell/phone within reach;with family/visitor present Nurse Communication: Mobility status;Need for lift equipment PT Visit  Diagnosis: Other abnormalities of gait and mobility (R26.89);Muscle weakness (generalized) (M62.81);Hemiplegia and hemiparesis Hemiplegia - Right/Left: Right Hemiplegia - dominant/non-dominant: Dominant Hemiplegia - caused by: Cerebral infarction     Time: 0852-0928 PT Time Calculation (min) (ACUTE ONLY): 36 min  Charges:  $Therapeutic Activity: 23-37 mins                     Bayard Males, PT Acute Rehabilitation Services Office: [phone]    Lamarr Lulas 06/30/2021, 9:49 AM

## 2021-06-30 NOTE — Progress Notes (Signed)
Pharmacy Antibiotic Note  Debra Klein is a 46 y.o. female admitted on 06/19/2021 with MSSA bacteremia/endocarditis. S/p valve repair  Pharmacy has been consulted to change nafcillin to vancomycin dosing until tissue cultures finalized.  SCr up to 1.05 today - will adjust Vancomycin dose to avoid accumulation. If continues to climb - will consider an alternative agent.   Plan: - Adjust Vancomycin to 1250 mg IV every 24 hours (eAUC 440, SCr 1.05, Vd 0.5) - Will continue to follow renal function, culture results, LOT, and antibiotic de-escalation plans   Height: 5\' 6"  (167.6 cm) Weight: 100.6 kg (221 lb 12.5 oz) IBW/kg (Calculated) : 59.3  Temp (24hrs), Avg:98.8 F (37.1 C), Min:97.5 F (36.4 C), Max:99.5 F (37.5 C)  Recent Labs  Lab 06/27/21 2106 06/28/21 0300 06/28/21 1717 06/29/21 0410 06/29/21 1452 06/30/21 0417  WBC 34.3* 26.9* 27.6* 33.6*  --  30.8*  CREATININE 0.80 0.78 0.81 0.82 0.99 1.05*     Estimated Creatinine Clearance: 80.1 mL/min (A) (by C-G formula based on SCr of 1.05 mg/dL (H)).    No Known Allergies  6/8 Vancomycin >>6/9 6/8 Ceftriaxone >>6/9 6/8 Acyclovir x1 6/9 Nafcillin >>(7/28)- changed to vanc 6/18 6/18 Vanc >  6/8 BCID: MSSA 6/8 BCx: MSSA 6/8 MRSA PCR: positive 6/10 BCx2: neg 6/12 knee fluid cx: MSSA 6/13 TA: few candida albicans 6/16 Tissue - staph epi Thank you for allowing pharmacy to be a part of this patient's care.  Alycia Rossetti, PharmD, BCPS Infectious Diseases Clinical Pharmacist 06/30/2021 7:45 AM   **Pharmacist phone directory can now be found on [email or web address] ([email or web address]).  Listed under [place].

## 2021-06-30 NOTE — Progress Notes (Signed)
Pt placed on Bipap at this time per MD order.

## 2021-06-30 NOTE — Progress Notes (Signed)
Inpatient Rehab Admissions Coordinator:   Per therapy recommendations patient was screened for CIR candidacy by Clemens Catholic, MS, CCC-SLP. At this time, Pt. is not yet demonstrating the ability to tolerate the intensity of CIR; however,     Pt. may have potential to progress to becoming a potential CIR candidate, so CIR admissions team will follow and monitor for progress and participation with therapies and place consult order if Pt. appears to be an appropriate candidate. Please contact me with any questions.   Clemens Catholic, [place], [place] Admissions Coordinator  [phone] ([place]) [phone] (office)

## 2021-07-01 ENCOUNTER — Inpatient Hospital Stay (HOSPITAL_COMMUNITY): Payer: Medicare Other

## 2021-07-01 DIAGNOSIS — N179 Acute kidney failure, unspecified: Secondary | ICD-10-CM | POA: Diagnosis not present

## 2021-07-01 DIAGNOSIS — I330 Acute and subacute infective endocarditis: Secondary | ICD-10-CM | POA: Diagnosis not present

## 2021-07-01 DIAGNOSIS — G9340 Encephalopathy, unspecified: Secondary | ICD-10-CM | POA: Diagnosis not present

## 2021-07-01 DIAGNOSIS — A4101 Sepsis due to Methicillin susceptible Staphylococcus aureus: Secondary | ICD-10-CM | POA: Diagnosis not present

## 2021-07-01 LAB — BASIC METABOLIC PANEL
Anion gap: 7 (ref 5–15)
Anion gap: 8 (ref 5–15)
Anion gap: 9 (ref 5–15)
BUN: 19 mg/dL (ref 6–20)
BUN: 20 mg/dL (ref 6–20)
BUN: 20 mg/dL (ref 6–20)
CO2: 19 mmol/L — ABNORMAL LOW (ref 22–32)
CO2: 20 mmol/L — ABNORMAL LOW (ref 22–32)
CO2: 21 mmol/L — ABNORMAL LOW (ref 22–32)
Calcium: 7.7 mg/dL — ABNORMAL LOW (ref 8.9–10.3)
Calcium: 8.1 mg/dL — ABNORMAL LOW (ref 8.9–10.3)
Calcium: 8.2 mg/dL — ABNORMAL LOW (ref 8.9–10.3)
Chloride: 111 mmol/L (ref 98–111)
Chloride: 113 mmol/L — ABNORMAL HIGH (ref 98–111)
Chloride: 114 mmol/L — ABNORMAL HIGH (ref 98–111)
Creatinine, Ser: 0.91 mg/dL (ref 0.44–1.00)
Creatinine, Ser: 0.99 mg/dL (ref 0.44–1.00)
Creatinine, Ser: 1.02 mg/dL — ABNORMAL HIGH (ref 0.44–1.00)
GFR, Estimated: >60 mL/min (ref >60)
GFR, Estimated: >60 mL/min (ref >60)
GFR, Estimated: >60 mL/min (ref >60)
Glucose, Bld: 113 mg/dL — ABNORMAL HIGH (ref 70–99)
Glucose, Bld: 122 mg/dL — ABNORMAL HIGH (ref 70–99)
Glucose, Bld: 135 mg/dL — ABNORMAL HIGH (ref 70–99)
Potassium: 2.6 mmol/L — CL (ref 3.5–5.1)
Potassium: 2.9 mmol/L — ABNORMAL LOW (ref 3.5–5.1)
Potassium: 3 mmol/L — ABNORMAL LOW (ref 3.5–5.1)
Sodium: 140 mmol/L (ref 135–145)
Sodium: 141 mmol/L (ref 135–145)
Sodium: 141 mmol/L (ref 135–145)

## 2021-07-01 LAB — CBC
HCT: 26.7 % — ABNORMAL LOW (ref 36.0–46.0)
Hemoglobin: 8.7 g/dL — ABNORMAL LOW (ref 12.0–15.0)
MCH: 27.5 pg (ref 26.0–34.0)
MCHC: 32.6 g/dL (ref 30.0–36.0)
MCV: 84.5 fL (ref 80.0–100.0)
Platelets: 321 10*3/uL (ref 150–400)
RBC: 3.16 MIL/uL — ABNORMAL LOW (ref 3.87–5.11)
RDW: 19.5 % — ABNORMAL HIGH (ref 11.5–15.5)
WBC: 19.2 10*3/uL — ABNORMAL HIGH (ref 4.0–10.5)
nRBC: 0 % (ref 0.0–0.2)

## 2021-07-01 LAB — GLUCOSE, CAPILLARY
Glucose-Capillary: 91 mg/dL (ref 70–99)
Glucose-Capillary: 111 mg/dL — ABNORMAL HIGH (ref 70–99)
Glucose-Capillary: 122 mg/dL — ABNORMAL HIGH (ref 70–99)
Glucose-Capillary: 108 mg/dL — ABNORMAL HIGH (ref 70–99)
Glucose-Capillary: 122 mg/dL — ABNORMAL HIGH (ref 70–99)

## 2021-07-01 LAB — COOXEMETRY PANEL
Carboxyhemoglobin: 1.4 % (ref 0.5–1.5)
Methemoglobin: <0.7 % (ref 0.0–1.5)
O2 Saturation: 70.5 %
Total hemoglobin: 12.8 g/dL (ref 12.0–16.0)

## 2021-07-01 LAB — PROTIME-INR
INR: 1.8 — ABNORMAL HIGH (ref 0.8–1.2)
Prothrombin Time: 20.6 seconds — ABNORMAL HIGH (ref 11.4–15.2)

## 2021-07-01 MED ORDER — POTASSIUM CHLORIDE 10 MEQ/50ML IV SOLN
10.0000 meq | INTRAVENOUS | Status: AC
  Administered 2021-07-01 (×2): 10 meq via INTRAVENOUS
  Filled 2021-07-01 (×2): qty 50

## 2021-07-01 MED ORDER — FLUPHENAZINE HCL 1 MG PO TABS
1.0000 mg | ORAL_TABLET | Freq: Every day | ORAL | Status: DC
  Administered 2021-07-01 – 2021-07-08 (×8): 1 mg
  Filled 2021-07-01 (×9): qty 1

## 2021-07-01 MED ORDER — POTASSIUM CHLORIDE 10 MEQ/50ML IV SOLN
INTRAVENOUS | Status: AC
  Administered 2021-07-01: 10 meq via INTRAVENOUS
  Filled 2021-07-01: qty 50

## 2021-07-01 MED ORDER — POTASSIUM CHLORIDE 10 MEQ/100ML IV SOLN
10.0000 meq | INTRAVENOUS | Status: AC
  Administered 2021-07-01 – 2021-07-02 (×6): 10 meq via INTRAVENOUS
  Filled 2021-07-01: qty 100

## 2021-07-01 MED ORDER — POTASSIUM CHLORIDE 20 MEQ PO PACK
40.0000 meq | PACK | Freq: Three times a day (TID) | ORAL | Status: DC
  Administered 2021-07-01: 40 meq
  Filled 2021-07-01 (×2): qty 2

## 2021-07-01 MED ORDER — POTASSIUM CHLORIDE 10 MEQ/50ML IV SOLN
10.0000 meq | INTRAVENOUS | Status: AC
  Administered 2021-07-01 (×3): 10 meq via INTRAVENOUS
  Filled 2021-07-01 (×3): qty 50

## 2021-07-01 MED ORDER — AMIODARONE HCL 200 MG PO TABS
200.0000 mg | ORAL_TABLET | Freq: Two times a day (BID) | ORAL | Status: DC
  Administered 2021-07-01 – 2021-07-08 (×16): 200 mg
  Filled 2021-07-01 (×17): qty 1

## 2021-07-01 MED ORDER — CLOZAPINE 100 MG PO TABS
100.0000 mg | ORAL_TABLET | Freq: Every day | ORAL | Status: DC
Start: 2021-07-01 — End: 2021-07-01

## 2021-07-01 MED ORDER — POTASSIUM CHLORIDE 20 MEQ PO PACK
40.0000 meq | PACK | Freq: Three times a day (TID) | ORAL | Status: DC
Start: 2021-07-01 — End: 2021-07-01
  Administered 2021-07-01 (×2): 40 meq
  Filled 2021-07-01 (×2): qty 2

## 2021-07-01 NOTE — Progress Notes (Signed)
[facility] DAILY ICU PROGRESS NOTE                   [address]            [place] [address]          [phone]   4 Days Post-Op Procedure(s) (LRB): MITRAL VALVE REPAIR USING 28MM SIMUFORM SEMI-RIGID ANNULOPLASTY RING (N/A) TRANSESOPHAGEAL ECHOCARDIOGRAM (TEE) (N/A)  Total Length of Stay:  LOS: 12 days   Subjective: Became less responsive and flaccid in all f extremities last evening after Clozaril and Prolixin given, placed on BiPAP. Currently awake and seems to focus, moves left arm but not any of the other extremities.   On BiPAP, sat 100%. TF on hold.  Objective: Vital signs in last 24 hours: Temp:  [97.9 F (36.6 C)-98.8 F (37.1 C)] 98.7 F (37.1 C) (06/20 0730) Pulse Rate:  [56-86] 80 (06/20 0721) Cardiac Rhythm: Normal sinus rhythm (06/19 2000) Resp:  [14-34] 14 (06/20 0721) BP: (101-142)/(47-80) 121/74 (06/20 0700) SpO2:  [96 %-100 %] 100 % (06/20 0721) Arterial Line BP: (104-156)/(50-72) 121/64 (06/19 1800) FiO2 (%):  [40 %] 40 % (06/20 0721) Weight:  [96.9 kg] 96.9 kg (06/20 0100)  Filed Weights   06/29/21 0500 06/30/21 0449 07/01/21 0100  Weight: 102.7 kg 100.6 kg 96.9 kg    Weight change: -3.7 kg   Hemodynamic parameters for last 24 hours:    Intake/Output from previous day: 06/19 0701 - 06/20 0700 In: 2363.2 [I.V.:583.9; NG/GT:465.8; IV Piggyback:1313.5] Out: 2220 [Urine:1720; Stool:500]  Intake/Output this shift: No intake/output data recorded.  Current Meds: Scheduled Meds:  acetaminophen (TYLENOL) oral liquid 160 mg/5 mL  1,000 mg Per Tube Q6H   aspirin  81 mg Per Tube Daily   benztropine  1 mg Per Tube QHS   bisacodyl  10 mg Rectal Daily   Chlorhexidine Gluconate Cloth  6 each Topical Daily   cloZAPine  100 mg Per Tube QHS   feeding supplement (PROSource TF)  45 mL Per Tube TID   fluPHENAZine  1 mg Per Tube QHS   furosemide  40 mg Intravenous BID   insulin aspart  0-24 Units Subcutaneous Q6H   metoprolol tartrate   12.5 mg Oral BID   Or   metoprolol tartrate  12.5 mg Per Tube BID   mouth rinse  15 mL Mouth Rinse 4 times per day   pantoprazole (PROTONIX) IV  40 mg Intravenous Daily   potassium chloride  40 mEq Per Tube TID   sodium chloride flush  10-40 mL Intracatheter Q12H   sodium chloride flush  3 mL Intravenous Q12H   vitamin B-12  1,000 mcg Per Tube Daily   Continuous Infusions:  sodium chloride Stopped (06/27/21 2102)   sodium chloride     sodium chloride 10 mL/hr at 07/01/21 0700   amiodarone 30 mg/hr (07/01/21 0700)   feeding supplement (OSMOLITE 1.5 CAL) Stopped (06/30/21 2315)   lactated ringers     lactated ringers     nafcillin 12 g in sodium chloride 0.9 % 500 mL continuous infusion 20.8 mL/hr at 07/01/21 0700   potassium chloride 50 mL/hr at 07/01/21 0700   PRN Meds:.sodium chloride, dextrose, fentaNYL (SUBLIMAZE) injection, ipratropium-albuterol, metoprolol tartrate, ondansetron (ZOFRAN) IV, mouth rinse, oxyCODONE, sodium chloride flush, sodium chloride flush, traMADol, white petrolatum  General appearance: alert and will focus, withdraws left hand but does not move to command.  Heart: sinus tach. Lungs: breath sounds are clear anterior, good oxygenation. CXR showing  clear lung fields, mild basilar volume loss.  Abdomen: soft, no apparetn tenderness. Few bowel sounds Extremities: cool, heel protectors and SCD's in place to LE's Wound: dry.  Lab Results: CBC: Recent Labs    06/30/21 0417 06/30/21 2318 07/01/21 0407  WBC 30.8*  --  19.2*  HGB 9.4* 8.8* 8.7*  HCT 28.5* 26.0* 26.7*  PLT 333  --  321   BMET:  Recent Labs    06/30/21 2330 07/01/21 0406  NA 141 140  K 2.6* 3.0*  CL 114* 111  CO2 19* 20*  GLUCOSE 135* 113*  BUN 20 20  CREATININE 1.02* 0.99  CALCIUM 7.7* 8.1*    CMET: Lab Results  Component Value Date   WBC 19.2 (H) 07/01/2021   HGB 8.7 (L) 07/01/2021   HCT 26.7 (L) 07/01/2021   PLT 321 07/01/2021   GLUCOSE 113 (H) 07/01/2021   CHOL 161  06/20/2021   TRIG 157 (H) 06/23/2021   HDL <10 (L) 06/20/2021   LDLDIRECT 27.7 06/21/2021   LDLCALC UNABLE TO CALCULATE IF TRIGLYCERIDE OVER 400 mg/dL 06/20/2021   ALT 6 06/30/2021   AST 24 06/30/2021   NA 140 07/01/2021   K 3.0 (L) 07/01/2021   CL 111 07/01/2021   CREATININE 0.99 07/01/2021   BUN 20 07/01/2021   CO2 20 (L) 07/01/2021   TSH 0.856 06/20/2021   INR 1.8 (H) 07/01/2021   HGBA1C 6.1 (H) 06/27/2021      PT/INR:  Recent Labs    07/01/21 0406  LABPROT 20.6*  INR 1.8*   Radiology: DG Abd Portable 1V  Result Date: 06/30/2021 CLINICAL DATA:  Feeding tube placement EXAM: PORTABLE ABDOMEN - 1 VIEW COMPARISON:  Portable exam 0955 hours compared to 06/28/2021 FINDINGS: Tip of feeding tube projects over duodenal bulb. Post MVR. Gaseous distention of colon, mild. Bibasilar atelectasis. No acute osseous findings. IMPRESSION: Tip of feeding tube projects over duodenal bulb. Electronically Signed   By: Lavonia Dana M.D.   On: 06/30/2021 10:12     Assessment/Plan: S/P Procedure(s) (LRB): MITRAL VALVE REPAIR USING 28MM SIMUFORM SEMI-RIGID ANNULOPLASTY RING (N/A) TRANSESOPHAGEAL ECHOCARDIOGRAM (TEE) (N/A)  -POD4 mitral valve repair for MSSA endocarditis. Hemodynamics stable and not requiring any vaso-active support.  Continue supportive care.   -NEURO- CVA pre-op, likely embolic. Became more sedated last PM after her usual anti-psychotic medications. Dosing reduced by night critical care team, will also alter timing so they are not given simultaneously.   -PULM- more awake and oxygenating well on BiPAP. Try switching back to nasal cannula O2.  -GI- CorTrak placed yesterday. TF on hold due to BiPAP. Resume today.   -HEME- expected acute blood loss anemia. Hct stable. Monitor. INR elevated yesterday without explanation. Lovenox held, INR decreasing to 1.8 today.   -RENAL- normal renal function, UO adequate, Wt ~6kg + from pre-op.  Hypokalemic with replacement being given.       Antony Odea, PA-C [phone] 07/01/2021 8:00 AM

## 2021-07-01 NOTE — Evaluation (Signed)
Speech Language Pathology Evaluation Patient Details Name: Debra Klein MRN: [id number] DOB: 1975/07/06 Today's Date: 07/01/2021 Time: 0932-3557 SLP Time Calculation (min) (ACUTE ONLY): 10 min  Problem List:  Patient Active Problem List   Diagnosis Date Noted   Acute bacterial endocarditis    Pressure injury of skin 06/28/2021   S/P mitral valve repair 06/27/2021   Septic arthritis of knee, left ([place]) 06/21/2021   Bacteremia due to methicillin susceptible Staphylococcus aureus (MSSA) 06/20/2021   Toxic encephalopathy 06/20/2021   Cerebrovascular accident (CVA) due to embolic occlusion of left middle cerebral artery ([place]) 06/20/2021   Acute respiratory failure with hypoxia ([place]) 06/20/2021   Essential hypertension 06/19/2021   Hypercholesterolemia 06/19/2021   Iron deficiency anemia 06/19/2021   Prediabetes 06/19/2021   Schizophrenia ([place]) 06/19/2021   Sepsis ([place]) 06/19/2021   Transaminitis 06/19/2021   AKI (acute kidney injury) ([place]) 06/19/2021   Past Medical History:  Past Medical History:  Diagnosis Date   Hyperlipemia    Hypertension    IDA (iron deficiency anemia)    Schizophrenia ([place])    Past Surgical History:  Past Surgical History:  Procedure Laterality Date   KNEE ARTHROSCOPY Left 06/24/2021   Procedure: ARTHROSCOPY LEFT KNEE IRRIGRATION AND DEBRIEDMENT AND SYNOVECTOMY;  Surgeon: Mcarthur Rossetti, MD;  Location: [facility];  Service: Orthopedics;  Laterality: Left;   MITRAL VALVE REPAIR N/A 06/27/2021   Procedure: MITRAL VALVE REPAIR USING 28MM SIMUFORM SEMI-RIGID ANNULOPLASTY RING;  Surgeon: Melrose Nakayama, MD;  Location: [facility];  Service: Open Heart Surgery;  Laterality: N/A;   No previous surgery     TEE WITHOUT CARDIOVERSION N/A 06/27/2021   Procedure: TRANSESOPHAGEAL ECHOCARDIOGRAM (TEE);  Surgeon: Melrose Nakayama, MD;  Location: [facility];  Service: Open Heart Surgery;  Laterality: N/A;   HPI:  Patient is a 46 y.o. female with PMH: HTN, HLD, iron  deficiency, anemia, schizophrenia who presented to the hospital on 06/17/21 to ED for evaluation of AMS. Patient initially non-verbal but awake and alert in ED. Mother reported patient as ambulatory and verbal at baseline but had not been eating or drinking for past two weeks (also not taking medications). She has had multiple falls, seen by orthopedic day before admission and questioned possible fracture left ankle and placed in Cam boot. In ED, patient admitted with severe sepsis of unknown source; MRI showed large Left MCA infarct. Patient required intubation on 6/8-6/15/23 and was intubated for surgical procedure (mitral valve repair) on 6/16, extubated 6/18. Cortrak 6/19.   Assessment / Plan / Recommendation Clinical Impression  [name] was sitting in recliner after PT. She was alert and observed me bring water/ice into the room for swallow re-assessment. She requested water repeatedly with good clarity and fluency of speech. She perseverated on this sentence and had difficulty shifting attention despite max cues.  She was able to state name, DOB, location. She had periods of delayed or non-responsiveness, following simple commands <25% of the time and not responding to instructions or requests to generate speech.  She continued to request water throughout eval and at one point said, "I haven't eaten all day." SLP will follow for communication/cognition and focus on basic communication as participation improves.    SLP Assessment  SLP Recommendation/Assessment: Patient needs continued Speech [place] [facility] SLP Visit Diagnosis: Cognitive communication deficit (R41.841)    Recommendations for follow up therapy are one component of a multi-disciplinary discharge planning process, led by the attending physician.  Recommendations may be updated based on patient status, additional  functional criteria and insurance authorization.    Follow Up Recommendations  Acute inpatient rehab  (3hours/day)    Assistance Recommended at Discharge  Frequent or constant Supervision/Assistance  Functional Status Assessment Patient has had a recent decline in their functional status and demonstrates the ability to make significant improvements in function in a reasonable and predictable amount of time.  Frequency and Duration min 2x/week  2 weeks      SLP Evaluation Cognition  Overall Cognitive Status: Impaired/Different from baseline Orientation Level: Oriented to person;Oriented to place Attention: Sustained Sustained Attention: Impaired Sustained Attention Impairment: Verbal basic;Functional basic Behaviors: Impulsive;Perseveration       Comprehension  Auditory Comprehension Overall Auditory Comprehension: Impaired Yes/No Questions:  (no response) Commands: Impaired One Step Basic Commands:  (inconsistent) Reading Comprehension Reading Status: Not tested    Expression Expression Primary Mode of Expression: Verbal Verbal Expression Initiation: Impaired Automatic Speech: Name Level of Generative/Spontaneous Verbalization: Phrase Repetition:  (no response)   Oral / Motor  Oral Motor/Sensory Function Overall Oral Motor/Sensory Function: Moderate impairment Facial ROM: Reduced right;Suspected CN VII (facial) dysfunction Facial Symmetry: Abnormal symmetry right;Suspected CN VII (facial) dysfunction Facial Strength: Reduced right;Suspected CN VII (facial) dysfunction Lingual Strength: Reduced Motor Speech Overall Motor Speech: Appears within functional limits for tasks assessed            Juan Quam Laurice 07/01/2021, 4:57 PM Estill Bamberg L. Tivis Ringer, MA CCC/SLP Clinical Specialist - [place] Office number [phone]

## 2021-07-01 NOTE — Progress Notes (Addendum)
Physical Therapy Treatment Patient Details Name: Debra Klein MRN: [id number] DOB: Dec 30, 1975 Today's Date: 07/01/2021   History of Present Illness 46 y.o. female admitted 06/19/2021 with AMS, poor PO intake and recent falls, one resulting in L ankle fx. Pt with severe sepsis, septic arthritis of L knee, s/p Left knee arthroscopy with debridement and synovectomy 6/13. MRI 6/8 shows large L MCA infarct. Pt required intubation 6/8 - 6/14. S/p MVR 6/16 with extubation 6/18. PMHx: schizophrenia, HTN, anemia.    PT Comments    Pt with improved verbalizations able to state full name and age. Pt with increased responses for sitting balance and automatic use of LUE. No AROM noted of right side or LLE. PT tolerating increased activity with lift to chair but not assisting with rise to standing this session. Will continue to follow with [facility] noted decline in acceptance and D/C plan updated.   RA at 97% HR 82    Recommendations for follow up therapy are one component of a multi-disciplinary discharge planning process, led by the attending physician.  Recommendations may be updated based on patient status, additional functional criteria and insurance authorization.  Follow Up Recommendations  PT at Long-term acute care hospital     Assistance Recommended at Discharge Frequent or constant Supervision/Assistance  Patient can return home with the following Two people to help with walking and/or transfers;Two people to help with bathing/dressing/bathroom;Assistance with cooking/housework;Assistance with feeding;Direct supervision/assist for medications management;Direct supervision/assist for financial management;Assist for transportation;Help with stairs or ramp for entrance   Equipment Recommendations  Hospital bed;Wheelchair (measurements PT);Wheelchair cushion (measurements PT);Other (comment) (hoyer lift)    Recommendations for Other Services       Precautions / Restrictions  Precautions Precautions: Fall;Sternal;Other (comment) Precaution Comments: flexiseal, cortrak Restrictions Weight Bearing Restrictions: No LLE Weight Bearing: Weight bearing as tolerated     Mobility  Bed Mobility Overal bed mobility: Needs Assistance Bed Mobility: Rolling, Sidelying to Sit Rolling: Max assist Sidelying to sit: Max assist, +2 for physical assistance       General bed mobility comments: pt reaching with LUE for rail once rolling initiated and assisting with controlling trunk with rise from side to sit. Max multimodal cues. PT with righting reactions of trunk and LUE with transition to sitting and scooting to EOB with pad    Transfers Overall transfer level: Needs assistance   Transfers: Sit to/from Stand, Bed to chair/wheelchair/BSC Sit to Stand: Total assist, +2 physical assistance, From elevated surface           General transfer comment: total +2 to stand from bed x 2 trials today with use of pad and bil knees blocked, no initiation or assist with flexed trunk and right lean once weight on bil LE Transfer via Lift Equipment: [place]  Ambulation/Gait               General Gait Details: unable   Chief Strategy Officer    Modified Rankin (Stroke Patients Only) Modified Rankin (Stroke Patients Only) Pre-Morbid Rankin Score: No symptoms Modified Rankin: Severe disability     Balance Overall balance assessment: Needs assistance Sitting-balance support: No upper extremity supported, Feet supported Sitting balance-Leahy Scale: Fair Sitting balance - Comments: static sitting 10 min EOB with pt performing righting reactions with perturbation and guarding for safety Postural control: Right lateral lean   Standing balance-Leahy Scale: Zero Standing balance comment: total +2 for standing  Cognition Arousal/Alertness: Awake/alert Behavior During Therapy: Flat affect Overall Cognitive  Status: Impaired/Different from baseline Area of Impairment: Attention, Following commands                   Current Attention Level: Focused   Following Commands: Follows one step commands inconsistently, Follows one step commands with increased time       General Comments: pt able to state first and last name x 2 this session, provided correct age but unable to answer further orientation questions. PT continues to state need to go to the bathroom and able to respond yes to pain. Pt with left inattention and increased righting reactions and automatic movement of LUE        Exercises      General Comments        Pertinent Vitals/Pain Pain Assessment Pain Assessment: CPOT Body Movements: Protection Muscle Tension: Relaxed Compliance with ventilator (intubated pts.): N/A Vocalization (extubated pts.): Sighing, moaning Pain Location: left knee with movement Pain Descriptors / Indicators: Guarding, Grimacing (able to state "yes" to pain when asked) Pain Intervention(s): Monitored during session, Repositioned    Home Living                          Prior Function            PT Goals (current goals can now be found in the care plan section) Progress towards PT goals: Progressing toward goals    Frequency    Min 4X/week      PT Plan Current plan remains appropriate    Co-evaluation              AM-PAC PT "6 Clicks" Mobility   Outcome Measure  Help needed turning from your back to your side while in a flat bed without using bedrails?: Total Help needed moving from lying on your back to sitting on the side of a flat bed without using bedrails?: Total Help needed moving to and from a bed to a chair (including a wheelchair)?: Total Help needed standing up from a chair using your arms (e.g., wheelchair or bedside chair)?: Total Help needed to walk in hospital room?: Total Help needed climbing 3-5 steps with a railing? : Total 6 Click Score:  6    End of Session   Activity Tolerance: Patient tolerated treatment well Patient left: in chair;with call bell/phone within reach;with chair alarm set Nurse Communication: Mobility status;Need for lift equipment PT Visit Diagnosis: Other abnormalities of gait and mobility (R26.89);Muscle weakness (generalized) (M62.81);Hemiplegia and hemiparesis Hemiplegia - Right/Left: Right Hemiplegia - dominant/non-dominant: Dominant Hemiplegia - caused by: Cerebral infarction     Time: 2353-6144 PT Time Calculation (min) (ACUTE ONLY): 35 min  Charges:  $Therapeutic Activity: 23-37 mins                     Bayard Males, PT Acute Rehabilitation Services Office: [phone]    Sandy Salaam Charda Janis 07/01/2021, 11:52 AM

## 2021-07-01 NOTE — Progress Notes (Signed)
EVENING ROUNDS NOTE :     [address]       [place] [address]             [phone]                 4 Days Post-Op Procedure(s) (LRB): MITRAL VALVE REPAIR USING 28MM SIMUFORM SEMI-RIGID ANNULOPLASTY RING (N/A) TRANSESOPHAGEAL ECHOCARDIOGRAM (TEE) (N/A)   Total Length of Stay:  LOS: 12 days  Events:   No events Visiting with family Stable day    BP 117/74   Pulse 85   Temp 98.7 F (37.1 C) (Axillary)   Resp (!) 24   Ht 5\' 6"  (1.676 m)   Wt 96.9 kg   LMP  (LMP Unknown) Comment: negative urine pregnancy test 06/19/21  SpO2 100%   BMI 34.48 kg/m      Vent Mode: BIPAP;PCV FiO2 (%):  [40 %] 40 % Set Rate:  [14 bmp] 14 bmp PEEP:  [5 cmH20] 5 cmH20 Pressure Support:  [10 cmH20] 10 cmH20   sodium chloride Stopped (06/27/21 2102)   sodium chloride     sodium chloride 10 mL/hr at 07/01/21 1500   feeding supplement (OSMOLITE 1.5 CAL) Stopped (06/30/21 2315)   lactated ringers     lactated ringers     nafcillin 12 g in sodium chloride 0.9 % 500 mL continuous infusion 20.8 mL/hr at 07/01/21 1500    I/O last 3 completed shifts: In: 4302.6 [I.V.:1239.1; NG/GT:465.8; IV Piggyback:2597.6] Out: 2990 [Urine:2390; Stool:600]      Latest Ref Rng & Units 07/01/2021    4:07 AM 06/30/2021   11:18 PM 06/30/2021    4:17 AM  CBC  WBC 4.0 - 10.5 K/uL 19.2   30.8   Hemoglobin 12.0 - 15.0 g/dL 8.7  8.8  9.4   Hematocrit 36.0 - 46.0 % 26.7  26.0  28.5   Platelets 150 - 400 K/uL 321   333        Latest Ref Rng & Units 07/01/2021    5:00 PM 07/01/2021    4:06 AM 06/30/2021   11:30 PM  BMP  Glucose 70 - 99 mg/dL 122  113  135   BUN 6 - 20 mg/dL 19  20  20    Creatinine 0.44 - 1.00 mg/dL 0.91  0.99  1.02   Sodium 135 - 145 mmol/L 141  140  141   Potassium 3.5 - 5.1 mmol/L 2.9  3.0  2.6   Chloride 98 - 111 mmol/L 113  111  114   CO2 22 - 32 mmol/L 21  20  19    Calcium 8.9 - 10.3 mg/dL 8.2  8.1  7.7     ABG    Component Value Date/Time   PHART 7.442  06/30/2021 2318   PCO2ART 26.6 (L) 06/30/2021 2318   PO2ART 78 (L) 06/30/2021 2318   HCO3 18.2 (L) 06/30/2021 2318   TCO2 19 (L) 06/30/2021 2318   ACIDBASEDEF 5.0 (H) 06/30/2021 2318   O2SAT 70.5 07/01/2021 0407       Debra Bouillon, MD 07/01/2021 6:02 PM

## 2021-07-01 NOTE — Progress Notes (Signed)
[facility] for Infectious Disease   Reason for visit: Follow up on endocarditis, s/p MV repair  Interval History:  Staph epi sensitive to oxacillin    Objectives: Constitutional:  Vitals:   07/01/21 1300 07/01/21 1330  BP: 133/79 122/81  Pulse: 83 82  Resp: (!) 25 (!) 24  Temp:    SpO2: 99% 100%     Labs: Lab Results  Component Value Date   WBC 19.2 (H) 07/01/2021   HGB 8.7 (L) 07/01/2021   HCT 26.7 (L) 07/01/2021   MCV 84.5 07/01/2021   PLT 321 63/81/7711   Last metabolic panel Lab Results  Component Value Date   GLUCOSE 113 (H) 07/01/2021   NA 140 07/01/2021   K 3.0 (L) 07/01/2021   CL 111 07/01/2021   CO2 20 (L) 07/01/2021   BUN 20 07/01/2021   CREATININE 0.99 07/01/2021   GFRNONAA >60 07/01/2021   CALCIUM 8.1 (L) 07/01/2021   PHOS 3.4 06/27/2021   PROT 5.9 (L) 06/30/2021   ALBUMIN 2.0 (L) 06/30/2021   BILITOT 1.2 06/30/2021   ALKPHOS 48 06/30/2021   AST 24 06/30/2021   ALT 6 06/30/2021   ANIONGAP 9 07/01/2021     Imaging: Reviewed and incorporated in to decision making  6/19 cxr FINDINGS: Status post median sternotomy and aortic valve replacement. RIGHT-sided Swan-Ganz catheter has been removed. RIGHT IJ sheath remains in place. LEFT-sided chest tube has been removed. Nasogastric tube and mediastinal drain also removed. There is no pneumothorax. Focal areas of atelectasis seen at both lung bases. Possible small RIGHT pleural effusion versus atelectasis. No evidence for pulmonary edema.   IMPRESSION: Bibasilar atelectasis. Small RIGHT pleural effusion versus atelectasis.     Abx: 6/16-c vanc  6/09-18 nafcillin  Impression:  46 yo female schizophrenia, hx admitted 6/08 after several ground level falls and with left hemispheric stroke, found to have with mssa mv endocarditis,   #MV endocarditis s/p repair 6/16 6/08 bcx mssa 6/10 bcx negative 6/12 left knee fluid cx mssa Tee showed large 1.4x1.3 cm mobile mass posterior  mitral valve leafelet "PROCEDURE:  Median sternotomy, extracorporeal circulation, mitral valve repair with posterior leaflet resection and pericardial patch, 16 mm Gore-Tex cords, and 28 mm Medtronic SimuForm annuloplasty ring." 6/16 MV tissue staph epi (confirmed with lab -- culture plate and maldi c/w staph epi) Suspect it was mssa endocarditis all along, but then might have been seeded with staph epi this admission. Fortunately the staph epi is sensitive to oxacillin (R tetracycline; also S bactrim)  -switching vanc back to nafcillin   #stroke CTH on 6/8 showed loss of gray white matter alone with insula concerning for infarction CT on head neck 6/9 showed left MCA branch in the insula. No embolic source in the eck identified Concern for septic brain emboli  -patient on nafcillin for this reason. (Although studies had suggested that high dose cefazolin 2 gram q6hours would be suitable). Tolerating nafcillin so will continue that for now.     #left knee septic arthritis Mri 6/10 left knee showed septic arthritis and periarticular osteomyelitis with soft tissue abscesses S/p diagnostic arthrocentesis 6/12 cx showed mssa S/p on 6/13 left knee arthroscopy I&D and synovectomy -- no gross purulence found  -abx as above    Plan:  OPAT Orders Discharge antibiotics to be given via PICC line Discharge antibiotics: nafcillin  Duration: 6 weeks   End Date: 6/16-7/28  (On 7/28 will continue with oral abx for suppression 3-6 months)  [facility] Care Per Protocol:  Home health RN for IV administration and teaching; PICC line care and labs.    Labs weekly while on IV antibiotics: _x_ CBC with differential __ BMP _x_ CMP _x_ CRP __ ESR __ Vancomycin trough __ CK  _x_ Please pull PIC at completion of IV antibiotics __ Please leave PIC in place until doctor has seen patient or been notified  Fax weekly labs to [phone]  Clinic Follow Up Appt: 7/18 @ 930 with Dr  Candiss Norse  @  [facility] [place], [place], [place] [address] Phone: [phone]

## 2021-07-01 NOTE — Progress Notes (Signed)
NAME:  Shirla Hodgkiss, MRN:  [id number], DOB:  1975-06-25, LOS: 12 ADMISSION DATE:  06/19/2021, CONSULTATION DATE:  06/19/21 REFERRING MD:  Laurena Spies, CHIEF COMPLAINT:  respiratory failure   History of Present Illness:  Ms. Whitelaw is a 46 year old woman with a history of schizophrenia who presented to the hospital with acute encephalopathy. History obtained through chart review:   Family reports that about 3 weeks ago the patient started to be less verbal, but was still managing her medications. They were noticing for the last few days they would find a pill or 2 dropped on the floor although she was still overall taking her medications.  Her verbal output was actually improving in the last week or so, but then acutely she became completely nonverbal and so they came to the ED for further evaluation.  They report she did not have any other complaints and did not notice any fevers or any physical issues other than left leg swelling and pain   Per chart review, she stopped taking her medications approximately 1 week ago, had a fall on 06/15/2021 with left ankle swelling, became nonverbal on 6/6, and subsequently had another fall on 6/7 found to have a left lower leg fracture and discharged with a cam walker boot after radiographs of her left ankle and tibia/fibula revealed previous intramedullary rodding and rod removal of the left tibia without clear evidence of acute fracture.  Subsequently she fell again on 6/8 and was found to be febrile (101.2), tachycardic to 110s, noted to be nonverbal, not following commands, frequently dozing off but withdrawing in all 4 extremities without a facial droop.  She was started on vancomycin, ceftriaxone, acyclovir for meningitis coverage.  Additionally, per hospitalist admission note LP was attempted but unsuccessful at bedside.  Subsequently at approximately 9 PM the patient had a change in mental status (drooling) with gaze deviation to the left and was intubated. PCCM  consulted. Head CT demonstrating an insular hypodensity concerning for MCA infarct.   Pertinent  Medical History  Schizophrenia HTN anemia  Significant Hospital Events: Including procedures, antibiotic start and stop dates in addition to other pertinent events   6/7: Fall --> fibula fracture, Minimally verbal at an outpatient visit with [facility]  6/8: Presented to the ED nonverbal with altered mentation. Progressively worsening mentation with requirement of intubation for airway protection.  Empirically treated for sepsis, presumed meningitis. MRI with large MCA infarct and punctate right hemisphere infarcts.  6/9: Blood cultures obtained on arrival positive for MSSA  6/10 MRI knee demonstrates septic arthritis.  6/13 L knee arthroscopy with I&D with snyovectomy for septic arthritis, TEE 6/14 steroids for no cuff leak 6/15 extubated, much more alert 6/16 mitral valve repair. Left chest tube placed for pleural effusion  6/17 too weak to come off vent. Not following commands.  6/18 extubated 6/19 required BiPAP due to decreased responsiveness overnight after getting antipsychotic meds  Interim History / Subjective:  Patient had decreased responsiveness after she received antipsychotic meds overnight ABG and x-ray chest was done She was placed on BiPAP, now she is awake alert  Objective   Blood pressure 136/71, pulse 82, temperature 98.7 F (37.1 C), temperature source Axillary, resp. rate (!) 21, height 5\' 6"  (1.676 m), weight 96.9 kg, SpO2 100 %.    Vent Mode: BIPAP;PCV FiO2 (%):  [40 %] 40 % Set Rate:  [14 bmp] 14 bmp PEEP:  [5 cmH20] 5 cmH20 Pressure Support:  [10 cmH20] 10 cmH20   Intake/Output Summary (Last 24 hours)  at 07/01/2021 7106 Last data filed at 07/01/2021 0700 Gross per 24 hour  Intake 1867.46 ml  Output 2220 ml  Net -352.54 ml   Filed Weights   06/29/21 0500 06/30/21 0449 07/01/21 0100  Weight: 102.7 kg 100.6 kg 96.9 kg   Examination: Physical  exam: General: Acute on chronically ill-appearing female, lying on the bed HEENT: [place]/AT, eyes anicteric.  moist mucus membranes Neuro: Awake, globally aphasic, right hemiparesis.  Tracking examiner Chest: Coarse breath sounds, no wheezes or rhonchi Heart: Regular rate and rhythm, no murmurs or gallops Abdomen: Soft, nontender, nondistended, bowel sounds present Skin: No rash   Resolved issues  AKI 2/2 MSSA bacteremia  Acute respiratory failure  Assessment & Plan:  Acute left MCA stroke due to septic emboli from MSSA/staph epi mitral valve endocarditis Disseminated MSSA infection, leading to bacteremia and septic arthritis s/p left knee washout  MSSA bacteremia, acute native MV endocarditis with staph epi s/p Mitral Valve repair Appreciate Ortho & TCTS's management for source control Continue aspirin Post op per surg Appreciate infectious disease follow-up, recommend continuing vancomycin for 6 weeks followed by oral suppressive therapy  Staph epi culture showed sensitivity to oxacillin After switching antibiotics, white count is trending down, currently at 19 down from 51 yesterday Continue neuro watch  Post-operative respiratory failure 2/2 left sided atelectasis and further complicated by small left effusion  Patient became unresponsive overnight, requiring BiPAP Now BiPAP switched back to nasal cannula oxygen Encourage incentive spirometry and flutter valve Titrate oxygen with O2 sat goal 92%  Cardiogenic shock s/p MVR [place] today is 70% Continue diuresis Titrated off milrinone and dopamine Able to maintain MAP of 65  Schizophrenia, prominent negative symptoms  Cont Fluphenazine and Clozapine  Acute metabolic encephalopathy due to sepsis & stroke, may also have prominent negative symptoms from schizophrenia-- Mental status has improved, she has global aphasia now from a stroke Cont Clozapine, and Prolixin, will slowly go up on home doses Cont neuro-protective  interventions  Hypokalemia due to aggressive diuresis Continue aggressive electrolyte supplement and monitor  Anemia due to critical illness H&H remained stable Intermittent CBC; trigger for transfusion <7  Prediabetes Patient hemoglobin A1c 6.1 Continue sliding scale insulin CBG: 40-1 80  Best Practice (right click and "Reselect all SmartList Selections" daily)   Per primary team    Total critical care time: 32 minutes  Performed by: Jacky Kindle   Critical care time was exclusive of separately billable procedures and treating other patients.   Critical care was necessary to treat or prevent imminent or life-threatening deterioration.   Critical care was time spent personally by me on the following activities: development of treatment plan with patient and/or surrogate as well as nursing, discussions with consultants, evaluation of patient's response to treatment, examination of patient, obtaining history from patient or surrogate, ordering and performing treatments and interventions, ordering and review of laboratory studies, ordering and review of radiographic studies, pulse oximetry and re-evaluation of patient's condition.   Jacky Kindle, MD [facility] See Amion for pager If no response to pager, please call [phone] until 7pm After 7pm, Please call E-link [phone]

## 2021-07-01 NOTE — Progress Notes (Signed)
[place] Progress Note Patient Name: Debra Klein DOB: [birth year] MRN: [id number]   Date of Service  07/01/2021  HPI/Events of Note  Significant decrease in LOC after pych meds last night.  eICU Interventions  Prolixin decreased to 1 mg at night Clozaril decreased to 100 mg at night. If remains somnolent they may need to he held     Intervention Category Major Interventions: Delirium, psychosis, severe agitation - evaluation and management  Mauri Brooklyn, P 07/01/2021, 6:42 AM

## 2021-07-01 NOTE — Progress Notes (Signed)
Speech Language Pathology Treatment: Dysphagia  Patient Details Name: Raianna Slight MRN: [id number] DOB: [birth year] Today's Date: 07/01/2021 Time: 2130-8657 SLP Time Calculation (min) (ACUTE ONLY): 10 min  Assessment / Plan / Recommendation Clinical Impression  Pt was seen for dysphagia management. Cortrak in place; tendency to drool for right side of mouth. She was alert and reached with LUE for cup of ice. Provided her with individual ice chips - demonstrated attentive mastication, improved lip seal.  Accepted teaspoon of water from spoon. There was explosive, delayed coughing requiring immediate oral suctioning and cues to swallow.  Pt cleared secretions and appeared to be more comfortable. PO trials were discontinued given overt s/s of aspiration. Pt with poor awareness; she continued to request water despite reminders that she wasn't quite ready for food/liquids, but that we would continue to f/u. SLP will continue to follow for PO readiness and instrumental swallow study when appropriate. D/W RN.    HPI HPI: Patient is a 46 y.o. female with PMH: HTN, HLD, iron deficiency, anemia, schizophrenia who presented to the hospital on 06/17/21 to ED for evaluation of AMS. Patient initially non-verbal but awake and alert in ED. Mother reported patient as ambulatory and verbal at baseline but had not been eating or drinking for past two weeks (also not taking medications). She has had multiple falls, seen by orthopedic day before admission and questioned possible fracture left ankle and placed in Cam boot. In ED, patient admitted with severe sepsis of unknown source; MRI showed large Left MCA infarct. Patient required intubation on 6/8-6/15/23 and was intubated for surgical procedure (mitral valve repair) on 6/16, extubated 6/18. Cortrak 6/19.      SLP Plan  Continue with current plan of care  Patient needs continued Speech Lanaguage Pathology Services   Recommendations for follow up therapy are one  component of a multi-disciplinary discharge planning process, led by the attending physician.  Recommendations may be updated based on patient status, additional functional criteria and insurance authorization.    Recommendations  Diet recommendations: NPO Medication Administration: Via alternative means                Oral Care Recommendations: Oral care QID Follow Up Recommendations: Acute inpatient rehab (3hours/day) Assistance recommended at discharge: Frequent or constant Supervision/Assistance SLP Visit Diagnosis: Cognitive communication deficit (Q46.962) Plan: Continue with current plan of care         Monty Spicher L. Tivis Ringer, MA CCC/SLP Clinical Specialist - Acute Care SLP Acute Rehabilitation Services Office number [phone]   Juan Quam Laurice  07/01/2021, 5:00 PM

## 2021-07-01 NOTE — Progress Notes (Signed)
Pt has PRN Bipap orders, pt alert no distress noted at this time.

## 2021-07-01 NOTE — Progress Notes (Addendum)
Patient ID: Debra Klein, female   DOB: 10/17/1975, 46 y.o.   MRN: [id number] I did come to the bedside in 2H to check on Debra Klein's left knee.  She is 1 week status post a left knee arthroscopic I&D with synovectomy secondary to septic arthritis.  I removed the 2 sutures from the portal sites.  There is no significant knee joint effusion and no redness.  Further treatment from an infection standpoint as per the infectious disease service.  No further recommendations from an orthopedic standpoint.  Once she is able to mobilize, she can weight-bear as tolerated on the left lower extremity and can flex/extend the knee as comfort allows.

## 2021-07-02 ENCOUNTER — Inpatient Hospital Stay (HOSPITAL_COMMUNITY): Payer: Medicare Other

## 2021-07-02 ENCOUNTER — Inpatient Hospital Stay: Payer: Self-pay

## 2021-07-02 DIAGNOSIS — G9340 Encephalopathy, unspecified: Secondary | ICD-10-CM | POA: Diagnosis not present

## 2021-07-02 DIAGNOSIS — I330 Acute and subacute infective endocarditis: Secondary | ICD-10-CM | POA: Diagnosis not present

## 2021-07-02 DIAGNOSIS — N179 Acute kidney failure, unspecified: Secondary | ICD-10-CM | POA: Diagnosis not present

## 2021-07-02 LAB — GLUCOSE, CAPILLARY
Glucose-Capillary: 114 mg/dL — ABNORMAL HIGH (ref 70–99)
Glucose-Capillary: 119 mg/dL — ABNORMAL HIGH (ref 70–99)

## 2021-07-02 LAB — BASIC METABOLIC PANEL
Anion gap: 6 (ref 5–15)
BUN: 19 mg/dL (ref 6–20)
CO2: 22 mmol/L (ref 22–32)
Calcium: 8.2 mg/dL — ABNORMAL LOW (ref 8.9–10.3)
Chloride: 113 mmol/L — ABNORMAL HIGH (ref 98–111)
Creatinine, Ser: 0.95 mg/dL (ref 0.44–1.00)
GFR, Estimated: >60 mL/min (ref >60)
Glucose, Bld: 129 mg/dL — ABNORMAL HIGH (ref 70–99)
Potassium: 3.4 mmol/L — ABNORMAL LOW (ref 3.5–5.1)
Sodium: 141 mmol/L (ref 135–145)

## 2021-07-02 LAB — CBC
HCT: 27.2 % — ABNORMAL LOW (ref 36.0–46.0)
Hemoglobin: 8.8 g/dL — ABNORMAL LOW (ref 12.0–15.0)
MCH: 27.9 pg (ref 26.0–34.0)
MCHC: 32.4 g/dL (ref 30.0–36.0)
MCV: 86.3 fL (ref 80.0–100.0)
Platelets: 351 10*3/uL (ref 150–400)
RBC: 3.15 MIL/uL — ABNORMAL LOW (ref 3.87–5.11)
RDW: 19.6 % — ABNORMAL HIGH (ref 11.5–15.5)
WBC: 19.9 10*3/uL — ABNORMAL HIGH (ref 4.0–10.5)
nRBC: 0 % (ref 0.0–0.2)

## 2021-07-02 LAB — PROTIME-INR
INR: 1.5 — ABNORMAL HIGH (ref 0.8–1.2)
Prothrombin Time: 17.7 seconds — ABNORMAL HIGH (ref 11.4–15.2)

## 2021-07-02 MED ORDER — ENOXAPARIN SODIUM 40 MG/0.4ML IJ SOSY
40.0000 mg | PREFILLED_SYRINGE | INTRAMUSCULAR | Status: DC
  Administered 2021-07-02 – 2021-07-16 (×15): 40 mg via SUBCUTANEOUS
  Filled 2021-07-02 (×15): qty 0.4

## 2021-07-02 MED ORDER — SODIUM CHLORIDE 0.9% FLUSH
10.0000 mL | Freq: Two times a day (BID) | INTRAVENOUS | Status: DC
  Administered 2021-07-02 – 2021-07-03 (×4): 10 mL
  Administered 2021-07-05: 20 mL
  Administered 2021-07-07 – 2021-07-08 (×3): 10 mL

## 2021-07-02 MED ORDER — ACETAMINOPHEN 160 MG/5ML PO SOLN
650.0000 mg | Freq: Four times a day (QID) | ORAL | Status: DC | PRN
  Administered 2021-07-12: 650 mg
  Filled 2021-07-02: qty 20.3

## 2021-07-02 MED ORDER — SODIUM CHLORIDE 0.9% FLUSH: 10.0000 mL | INTRAVENOUS | Status: DC | PRN

## 2021-07-02 MED ORDER — POTASSIUM CHLORIDE 10 MEQ/50ML IV SOLN
10.0000 meq | INTRAVENOUS | Status: AC
  Administered 2021-07-02 (×3): 10 meq via INTRAVENOUS
  Filled 2021-07-02 (×3): qty 50

## 2021-07-02 MED ORDER — POTASSIUM CHLORIDE 20 MEQ PO PACK
40.0000 meq | PACK | Freq: Three times a day (TID) | ORAL | Status: AC
Start: 2021-07-02 — End: 2021-07-03
  Administered 2021-07-02 – 2021-07-03 (×4): 40 meq
  Filled 2021-07-02 (×3): qty 2

## 2021-07-02 MED ORDER — POTASSIUM CHLORIDE 20 MEQ PO PACK
40.0000 meq | PACK | Freq: Once | ORAL | Status: AC
  Administered 2021-07-02: 40 meq
  Filled 2021-07-02: qty 2

## 2021-07-02 MED FILL — Sodium Chloride IV Soln 0.9%: INTRAVENOUS | Qty: 2000 | Status: AC

## 2021-07-02 MED FILL — Heparin Sodium (Porcine) Inj 1000 Unit/ML: INTRAMUSCULAR | Qty: 20 | Status: AC

## 2021-07-02 MED FILL — Lidocaine HCl Local Preservative Free (PF) Inj 2%: INTRAMUSCULAR | Qty: 15 | Status: AC

## 2021-07-02 MED FILL — Electrolyte-R (PH 7.4) Solution: INTRAVENOUS | Qty: 5000 | Status: AC

## 2021-07-02 MED FILL — Sodium Bicarbonate IV Soln 8.4%: INTRAVENOUS | Qty: 50 | Status: AC

## 2021-07-02 MED FILL — Magnesium Sulfate Inj 50%: INTRAMUSCULAR | Qty: 10 | Status: AC

## 2021-07-02 MED FILL — Potassium Chloride Inj 2 mEq/ML: INTRAVENOUS | Qty: 40 | Status: AC

## 2021-07-02 MED FILL — Heparin Sodium (Porcine) Inj 1000 Unit/ML: Qty: 1000 | Status: AC

## 2021-07-02 MED FILL — Mannitol IV Soln 20%: INTRAVENOUS | Qty: 500 | Status: AC

## 2021-07-02 NOTE — Progress Notes (Signed)
Inpatient Rehab Admissions Coordinator:   CIR consult received. Pt. Is currently not at a level that I believe she can tolerate intensity of CIR. I can continue to follow at a distance for progress and participation with therapies. Recommend that TOC continue to look for other rehab venues.   Clemens Catholic, [place], [place] Admissions Coordinator  [phone] ([place]) [phone] (office)

## 2021-07-02 NOTE — Progress Notes (Signed)
[facility] ADULT ICU REPLACEMENT PROTOCOL   The patient does apply for the [facility] Adult ICU Electrolyte Replacment Protocol based on the criteria listed below:   1.Exclusion criteria: TCTS patients, ECMO patients, and Dialysis patients 2. Is GFR >/= 30 ml/min? Yes.    Patient's GFR today is >60 3. Is SCr </= 2? Yes.   Patient's SCr is 0.95 mg/dL 4. Did SCr increase >/= 0.5 in 24 hours? No. 5.Pt's weight >40kg  Yes.   6. Abnormal electrolyte(s): K+ 3.4  7. Electrolytes replaced per protocol 8.  Call MD STAT for K+ </= 2.5, Phos </= 1, or Mag </= 1 Physician:  n/a  Debra Klein 07/02/2021 3:37 AM

## 2021-07-02 NOTE — Progress Notes (Addendum)
TCTS DAILY ICU PROGRESS NOTE                   [address]            ,[place] [id number]          [phone]   5 Days Post-Op Procedure(s) (LRB): MITRAL VALVE REPAIR USING 28MM SIMUFORM SEMI-RIGID ANNULOPLASTY RING (N/A) TRANSESOPHAGEAL ECHOCARDIOGRAM (TEE) (N/A)  Total Length of Stay:  LOS: 13 days   Subjective: Had a stable day yesterday, able to comoe off BiPAP early in the day and resp status has remained stable.  Resting in bed, awake and responsive, asking for ice.  On RA, sat 100%. TF infusing at goal.  Objective: Vital signs in last 24 hours: Temp:  [98.4 F (36.9 C)-99.7 F (37.6 C)] 99.5 F (37.5 C) (06/21 0600) Pulse Rate:  [76-91] 86 (06/21 0500) Cardiac Rhythm: Normal sinus rhythm (06/21 0000) Resp:  [12-30] 23 (06/21 0600) BP: (109-138)/(52-83) 109/52 (06/21 0600) SpO2:  [92 %-100 %] 99 % (06/21 0500) FiO2 (%):  [40 %] 40 % (06/20 0800) Weight:  [92.9 kg] 92.9 kg (06/21 0417)  Filed Weights   06/30/21 0449 07/01/21 0100 07/02/21 0417  Weight: 100.6 kg 96.9 kg 92.9 kg    Weight change: -4 kg   Hemodynamic parameters for last 24 hours:    Intake/Output from previous day: 06/20 0701 - 06/21 0700 In: 1718.9 [I.V.:113; NG/GT:541.7; IV Piggyback:1064.2] Out: 0626 [Urine:2575; Stool:1100]  Intake/Output this shift: No intake/output data recorded.  Current Meds: Scheduled Meds:  acetaminophen (TYLENOL) oral liquid 160 mg/5 mL  1,000 mg Per Tube Q6H   amiodarone  200 mg Per Tube BID   aspirin  81 mg Per Tube Daily   benztropine  1 mg Per Tube QHS   bisacodyl  10 mg Rectal Daily   Chlorhexidine Gluconate Cloth  6 each Topical Daily   feeding supplement (PROSource TF)  45 mL Per Tube TID   fluPHENAZine  1 mg Per Tube QHS   furosemide  40 mg Intravenous BID   insulin aspart  0-24 Units Subcutaneous Q6H   metoprolol tartrate  12.5 mg Oral BID   Or   metoprolol tartrate  12.5 mg Per Tube BID   mouth rinse  15 mL Mouth Rinse 4  times per day   pantoprazole (PROTONIX) IV  40 mg Intravenous Daily   potassium chloride  40 mEq Per Tube TID   sodium chloride flush  10-40 mL Intracatheter Q12H   sodium chloride flush  3 mL Intravenous Q12H   vitamin B-12  1,000 mcg Per Tube Daily   Continuous Infusions:  sodium chloride Stopped (06/27/21 2102)   sodium chloride     sodium chloride 10 mL/hr at 07/02/21 0100   feeding supplement (OSMOLITE 1.5 CAL) 1,000 mL (07/02/21 0404)   lactated ringers     lactated ringers     nafcillin 12 g in sodium chloride 0.9 % 500 mL continuous infusion 20.8 mL/hr at 07/02/21 0100   PRN Meds:.sodium chloride, dextrose, fentaNYL (SUBLIMAZE) injection, ipratropium-albuterol, metoprolol tartrate, ondansetron (ZOFRAN) IV, mouth rinse, oxyCODONE, sodium chloride flush, sodium chloride flush, traMADol, white petrolatum  General appearance: alert and responsive, moves left arm and hand Heart: sinus tach. Lungs: breath sounds are clear anterior, good oxygenation. CXR stable.  Abdomen: soft, no tenderness. Active bowel sounds Extremities: warm, heel protectors and SCD's in place to LE's Wound: the sternootomy incision is intact and dry  Lab Results: CBC: Recent Labs  07/01/21 0407 07/02/21 0301  WBC 19.2* 19.9*  HGB 8.7* 8.8*  HCT 26.7* 27.2*  PLT 321 351    BMET:  Recent Labs    07/01/21 1700 07/02/21 0301  NA 141 141  K 2.9* 3.4*  CL 113* 113*  CO2 21* 22  GLUCOSE 122* 129*  BUN 19 19  CREATININE 0.91 0.95  CALCIUM 8.2* 8.2*     CMET: Lab Results  Component Value Date   WBC 19.9 (H) 07/02/2021   HGB 8.8 (L) 07/02/2021   HCT 27.2 (L) 07/02/2021   PLT 351 07/02/2021   GLUCOSE 129 (H) 07/02/2021   CHOL 161 06/20/2021   TRIG 157 (H) 06/23/2021   HDL <10 (L) 06/20/2021   LDLDIRECT 27.7 06/21/2021   LDLCALC UNABLE TO CALCULATE IF TRIGLYCERIDE OVER 400 mg/dL 06/20/2021   ALT 6 06/30/2021   AST 24 06/30/2021   NA 141 07/02/2021   K 3.4 (L) 07/02/2021   CL 113 (H)  07/02/2021   CREATININE 0.95 07/02/2021   BUN 19 07/02/2021   CO2 22 07/02/2021   TSH 0.856 06/20/2021   INR 1.5 (H) 07/02/2021   HGBA1C 6.1 (H) 06/27/2021      PT/INR:  Recent Labs    07/02/21 0301  LABPROT 17.7*  INR 1.5*    Radiology: No results found.   Assessment/Plan: S/P Procedure(s) (LRB): MITRAL VALVE REPAIR USING 28MM SIMUFORM SEMI-RIGID ANNULOPLASTY RING (N/A) TRANSESOPHAGEAL ECHOCARDIOGRAM (TEE) (N/A)  -POD5 mitral valve repair for staph epi endocarditis. BP and cardiac rhythm stable. ID following and adjusted ABX vanc=>nafcillin with orders to continue through 7/28. Will need PICC eventually but would like to see WBC normalize before placing.  Continue supportive care.   -NEURO- CVA pre-op, likely embolic. PT/OT/SLP on board, CIR recommended. Will request CIR consult.  Continue supportive care.  -PULM- oxygenating well on RA.   -GI- CorTrak in place. TF at goal.  -HEME- expected acute blood loss anemia. Hct stable. Monitor. INR elevated for unclear reasons, has normal LFT's and on no anticoagulation. INR is gradually correcting. Resume Lovenox this PM.  -RENAL- normal renal function, UO adequate, Wt ~6kg + from pre-op.  Hypokalemic with replacement being given.      Antony Odea, PA-C [phone] 07/02/2021 7:22 AM  Patient seen and examined. Following commands with right grip and moving toes! Need to get central line out so will go ahead with PICC placement  Remo Lipps C. Roxan Hockey, MD [facility] [phone]

## 2021-07-02 NOTE — Progress Notes (Signed)
PT Cancellation Note  Patient Details Name: Debra Klein MRN: [id number] DOB: February 26, 1975   Cancelled Treatment:    Reason Eval/Treat Not Completed: Patient s/p PICC placement, preparing for transfer to 4E; will check back for PT treatment this afternoon as schedule permits.  Mabeline Caras, PT, DPT Acute Rehabilitation Services  Pager [phone] Office [place] 07/02/2021, 11:47 AM

## 2021-07-02 NOTE — Progress Notes (Signed)
Patient brought to 4E from [place]. Mother present. VSS. Patient alert and oriented to self, place, situation. Disoriented to time. Telemetry box applied, CCMD notified. Patient oriented to room and staff. Call bell in reach.  Daymon Larsen, RN

## 2021-07-02 NOTE — Progress Notes (Signed)
Peripherally Inserted Central Catheter Placement  The IV Nurse has discussed with the patient and/or persons authorized to consent for the patient, the purpose of this procedure and the potential benefits and risks involved with this procedure.  The benefits include less needle sticks, lab draws from the catheter, and the patient may be discharged home with the catheter. Risks include, but not limited to, infection, bleeding, blood clot (thrombus formation), and puncture of an artery; nerve damage and irregular heartbeat and possibility to perform a PICC exchange if needed/ordered by physician.  Alternatives to this procedure were also discussed.  Bard Power PICC patient education guide, fact sheet on infection prevention and patient information card has been provided to patient /or left at bedside.    PICC Placement Documentation  PICC Double Lumen 93/81/01 Right Basilic 40 cm 2 cm (Active)  Indication for Insertion or Continuance of Line Prolonged intravenous therapies 07/02/21 1026  Exposed Catheter (cm) 2 cm 07/02/21 1026  Site Assessment Clean, Dry, Intact 07/02/21 1026  Lumen #1 Status Flushed;Saline locked;Blood return noted 07/02/21 1026  Lumen #2 Status Flushed;Saline locked;Blood return noted 07/02/21 1026  Dressing Type Transparent;Securing device 07/02/21 1026  Dressing Status Antimicrobial disc in place;Clean, Dry, Intact 07/02/21 1026  Safety Lock Not Applicable 75/10/25 8527  Line Adjustment (NICU/IV Team Only) No 07/02/21 1026  Dressing Intervention New dressing;Other (Comment) 07/02/21 1026  Dressing Change Due 07/09/21 07/02/21 1026    Patient's mother, Lisaann Atha, signed consent via telephone. Verified by 2 PICC RNs.    Enos Fling 07/02/2021, 10:28 AM

## 2021-07-02 NOTE — Progress Notes (Signed)
NAME:  Debra Klein, MRN:  [id number], DOB:  06-May-1975, LOS: 71 ADMISSION DATE:  06/19/2021, CONSULTATION DATE:  06/19/21 REFERRING MD:  Laurena Spies, CHIEF COMPLAINT:  respiratory failure   History of Present Illness:  Debra Klein is a 46 year old woman with a history of schizophrenia who presented to the hospital with acute encephalopathy. History obtained through chart review:   Family reports that about 3 weeks ago the patient started to be less verbal, but was still managing her medications. They were noticing for the last few days they would find a pill or 2 dropped on the floor although she was still overall taking her medications.  Her verbal output was actually improving in the last week or so, but then acutely she became completely nonverbal and so they came to the ED for further evaluation.  They report she did not have any other complaints and did not notice any fevers or any physical issues other than left leg swelling and pain   Per chart review, she stopped taking her medications approximately 1 week ago, had a fall on 06/15/2021 with left ankle swelling, became nonverbal on 6/6, and subsequently had another fall on 6/7 found to have a left lower leg fracture and discharged with a cam walker boot after radiographs of her left ankle and tibia/fibula revealed previous intramedullary rodding and rod removal of the left tibia without clear evidence of acute fracture.  Subsequently she fell again on 6/8 and was found to be febrile (101.2), tachycardic to 110s, noted to be nonverbal, not following commands, frequently dozing off but withdrawing in all 4 extremities without a facial droop.  She was started on vancomycin, ceftriaxone, acyclovir for meningitis coverage.  Additionally, per hospitalist admission note LP was attempted but unsuccessful at bedside.  Subsequently at approximately 9 PM the patient had a change in mental status (drooling) with gaze deviation to the left and was intubated. PCCM  consulted. Head CT demonstrating an insular hypodensity concerning for MCA infarct.   Pertinent  Medical History  Schizophrenia HTN anemia  Significant Hospital Events: Including procedures, antibiotic start and stop dates in addition to other pertinent events   6/7: Fall --> fibula fracture, Minimally verbal at an outpatient visit with [facility]  6/8: Presented to the ED nonverbal with altered mentation. Progressively worsening mentation with requirement of intubation for airway protection.  Empirically treated for sepsis, presumed meningitis. MRI with large MCA infarct and punctate right hemisphere infarcts.  6/9: Blood cultures obtained on arrival positive for MSSA  6/10 MRI knee demonstrates septic arthritis.  6/13 L knee arthroscopy with I&D with snyovectomy for septic arthritis, TEE 6/14 steroids for no cuff leak 6/15 extubated, much more alert 6/16 mitral valve repair. Left chest tube placed for pleural effusion  6/17 too weak to come off vent. Not following commands.  6/18 extubated 6/19 required BiPAP due to decreased responsiveness overnight after getting antipsychotic meds  Interim History / Subjective:  No overnight issues Patient is on room air now  Objective   Blood pressure 124/72, pulse 91, temperature 99.5 F (37.5 C), resp. rate (!) 22, height 5\' 6"  (1.676 m), weight 92.9 kg, SpO2 100 %.        Intake/Output Summary (Last 24 hours) at 07/02/2021 0936 Last data filed at 07/02/2021 0263 Gross per 24 hour  Intake 2066.34 ml  Output 4305 ml  Net -2238.66 ml   Filed Weights   06/30/21 0449 07/01/21 0100 07/02/21 0417  Weight: 100.6 kg 96.9 kg 92.9 kg   Examination:  Physical exam: General: Acute on chronically ill-appearing female, lying on the bed HEENT: Debra Klein, eyes anicteric.  moist mucus membranes Neuro: Awake, globally aphasic, right hemiparesis.  Tracking examiner Chest: Coarse breath sounds, no wheezes or rhonchi Heart: Regular rate and rhythm, no  murmurs or gallops Abdomen: Soft, nontender, nondistended, bowel sounds present Skin: No rash   Resolved issues  AKI 2/2 MSSA bacteremia  Acute respiratory failure  Assessment & Plan:  Acute left MCA stroke due to septic emboli from MSSA/staph epi mitral valve endocarditis Disseminated MSSA infection, leading to bacteremia and septic arthritis s/p left knee washout  MSSA bacteremia, acute native MV endocarditis with staph epi s/p Mitral Valve repair Appreciate Ortho & TCTS's management for source control Continue aspirin Post op per surg Appreciate infectious disease follow-up, recommend continuing switching vancomycin back to nafcillin considering staph epi is oxacillin sensitive  Patient will require long-term antibiotic therapy  White count remains at 19 Continue neuro watch  Post-operative respiratory failure 2/2 left sided atelectasis and further complicated by small left effusion  Patient is on room air now Encourage incentive spirometry and flutter valve  Cardiogenic shock s/p MVR Continue diuresis Off inotropes and vasopressors  Schizophrenia, prominent negative symptoms  Cont Fluphenazine and Clozapine  Acute metabolic encephalopathy due to sepsis & stroke, may also have prominent negative symptoms from schizophrenia-- Mental status has improved, she has global aphasia now from a stroke Cont Prolixin, will slowly go up on home doses Holding Clozaril considering high QTc, will reinitiate home meds slowly Cont neuro-protective interventions  Hypokalemia due to aggressive diuresis and loose bowel movement Continue aggressive electrolyte supplement and monitor We will ask dietitian to change formula on tube feeding with addition of fiber which might help reduce her diarrhea  Anemia due to critical illness H&H remained stable Intermittent CBC; trigger for transfusion <7  Prediabetes Patient hemoglobin A1c 6.1 Continue sliding scale insulin CBG: 40-1 80  Best  Practice (right click and "Reselect all SmartList Selections" daily)     Debra Kindle, MD [facility] See Amion for pager If no response to pager, please call [phone] until 7pm After 7pm, Please call E-link [phone]

## 2021-07-03 ENCOUNTER — Inpatient Hospital Stay (HOSPITAL_COMMUNITY): Payer: Medicare Other

## 2021-07-03 DIAGNOSIS — R6520 Severe sepsis without septic shock: Secondary | ICD-10-CM | POA: Diagnosis not present

## 2021-07-03 DIAGNOSIS — A4101 Sepsis due to Methicillin susceptible Staphylococcus aureus: Secondary | ICD-10-CM | POA: Diagnosis not present

## 2021-07-03 DIAGNOSIS — G9341 Metabolic encephalopathy: Secondary | ICD-10-CM | POA: Diagnosis not present

## 2021-07-03 LAB — ECHO INTRAOPERATIVE TEE
Height: 66 in
Weight: 3181.68 oz

## 2021-07-03 LAB — CBC
HCT: 26 % — ABNORMAL LOW (ref 36.0–46.0)
Hemoglobin: 8.7 g/dL — ABNORMAL LOW (ref 12.0–15.0)
MCH: 28.3 pg (ref 26.0–34.0)
MCHC: 33.5 g/dL (ref 30.0–36.0)
MCV: 84.7 fL (ref 80.0–100.0)
Platelets: 381 10*3/uL (ref 150–400)
RBC: 3.07 MIL/uL — ABNORMAL LOW (ref 3.87–5.11)
RDW: 20 % — ABNORMAL HIGH (ref 11.5–15.5)
WBC: 17.4 10*3/uL — ABNORMAL HIGH (ref 4.0–10.5)
nRBC: 0 % (ref 0.0–0.2)

## 2021-07-03 LAB — BASIC METABOLIC PANEL
Anion gap: 5 (ref 5–15)
BUN: 19 mg/dL (ref 6–20)
CO2: 24 mmol/L (ref 22–32)
Calcium: 7.8 mg/dL — ABNORMAL LOW (ref 8.9–10.3)
Chloride: 113 mmol/L — ABNORMAL HIGH (ref 98–111)
Creatinine, Ser: 0.77 mg/dL (ref 0.44–1.00)
GFR, Estimated: >60 mL/min (ref >60)
Glucose, Bld: 138 mg/dL — ABNORMAL HIGH (ref 70–99)
Potassium: 3.8 mmol/L (ref 3.5–5.1)
Sodium: 142 mmol/L (ref 135–145)

## 2021-07-03 LAB — GLUCOSE, CAPILLARY
Glucose-Capillary: 110 mg/dL — ABNORMAL HIGH (ref 70–99)
Glucose-Capillary: 114 mg/dL — ABNORMAL HIGH (ref 70–99)
Glucose-Capillary: 128 mg/dL — ABNORMAL HIGH (ref 70–99)
Glucose-Capillary: 128 mg/dL — ABNORMAL HIGH (ref 70–99)
Glucose-Capillary: 131 mg/dL — ABNORMAL HIGH (ref 70–99)
Glucose-Capillary: 99 mg/dL (ref 70–99)

## 2021-07-03 LAB — PROTIME-INR
INR: 1.2 (ref 0.8–1.2)
Prothrombin Time: 15.1 seconds (ref 11.4–15.2)

## 2021-07-03 NOTE — Progress Notes (Addendum)
PROGRESS NOTE  Debra Klein  HMC:[id number] DOB: 1975/03/29 DOA: 06/19/2021 PCP: Gaynelle Arabian, MD   Brief Narrative: Patient is a 46 year old female with history of schizophrenia , hypertension, hyperlipidemia who initially presented with acute encephalopathy.  No report of eating or drinking for 2 weeks, not taking medications, having multiple falls and recently sustained fracture on the left ankle, placed on cam boot.  She was found on the floor.  On presentation she was found to have fever, leukocytosis, AKI, elevated LFT, hyperkalemia, lactic acidosis.  Patient was started on IV fluids, empiric antibiotics and was admitted.  Her encephalopathy continued to deteriorate and she was finally intubated and was transferred to [facility] service.  CT head showed insular hypodensity concerning for MCA infarct.  Hospital course also remarkable for finding of bacteremia,mitral valve endocarditis, status post mitral valve repair.  ID was following.  Prolonged hospitalization.  Patient transferred to [facility] service on 6/22.  Currently hemodynamically stable.  PT/OT recommending CIR on discharge.  TOC also considering LTAC on discharge  Important events  6/7: Fall --> fibula fracture, Minimally verbal at an outpatient visit with [facility]  6/8: Presented to the ED nonverbal with altered mentation. Progressively worsening mentation with requirement of intubation for airway protection.  Empirically treated for sepsis, presumed meningitis. MRI with large MCA infarct and punctate right hemisphere infarcts.  6/9: Blood cultures obtained on arrival positive for MSSA  6/10 MRI knee demonstrates septic arthritis.  6/13 L knee arthroscopy with I&D with snyovectomy for septic arthritis, TEE 6/14 steroids for no cuff leak 6/15 extubated, much more alert 6/16 mitral valve repair. Left chest tube placed for pleural effusion  6/17 too weak to come off vent. Not following commands.  6/18 extubated 6/19 required BiPAP due to  decreased responsiveness overnight after getting antipsychotic meds    Assessment & Plan:  Principal Problem:   Sepsis ([place]) Active Problems:   Schizophrenia ([place])   Transaminitis   AKI (acute kidney injury) ([place])   Bacteremia due to methicillin susceptible Staphylococcus aureus (MSSA)   Toxic encephalopathy   Cerebrovascular accident (CVA) due to embolic occlusion of left middle cerebral artery (HCC)   Acute respiratory failure with hypoxia (HCC)   Septic arthritis of knee, left (HCC)   S/P mitral valve repair   Pressure injury of skin   Acute bacterial endocarditis  Sepsis/MSSA bacteremia/Staph epidermidis mitral valve endocarditis: ID was following.  Continue current antibiotics.  Still has leukocytosis.  Afebrile.  Requires long-term antibiotic therapy.  Currently on nafcillin, plan to continue through 7/28. eventually needs new PICC line  Staph epidermidis mitral valve endocarditis: Cardiothoracic surgery following, status post mitral valve repair.  Acute metabolic encephalopathy: Secondary to sepsis, stroke.  Mental status has improved but has global aphasia now from a stroke.  left MCA stroke due to septic emboli: Neurology was following.  Continue to monitor mental status.  Has right-sided weakness, aphasic, obeys commands,On aspirin  Brief episode of A-fib: Cardiothoracic surgery started on amiodarone.  Also on metoprolol.  Currently in normal sinus rhythm  Acute respiratory failure: Secondary to left-sided atelectasis, left-sided pleural effusion.  Currently on room air.  Encourage incentive spirometry ,flutter valve  Dysphagia: Secondary to stroke.  Currently on core track.  Might need PEG tube if no further improvement in dysphagia.  Speech therapy following, recommending n.p.o.  Cardiogenic shock: Suspected to be from mitral valve regurgitation.  On IV diuresis.  She was on inotropes, vasopressors, now discontinued.  Currently blood pressure stable.  Still on Lasix 40  mg IV twice daily, still looks volume overloaded, will continue for now.  Will change to oral Lasix when appropriate.  Monitor BMP  Severe hypokalemia: Secondary to aggressive diuresis, loose bowel movement.  Continue to monitor and supplement  Left knee septic arthritis: MRI of the left knee on 6/10 showed septic arthritis, peritubular osteomyelitis with subcu abscess.  Status post diagnostic arthrocentesis on 6/12.  Cultures showed MSSA.  Status post left knee arthroscopy, I&D, synovectomy.  Continue current antibiotics  Normocytic anemia: Currently hemoglobin stable.  Prediabetes: Hemoglobin A1c of 6.1.  Currently on sliding scale insulin  History of schizophrenia: Continue fluphenazine, clozapine  Disposition: PT/OT recommended acute inpatient rehab.  TOC considering LTAC     Nutrition Problem: Increased nutrient needs Etiology:  (endocarditis) Pressure Injury 06/27/21 Buttocks Posterior;Medial Stage 2 -  Partial thickness loss of dermis presenting as a shallow open injury with a red, pink wound bed without slough. (Active)  06/27/21 1900  Location: Buttocks  Location Orientation: Posterior;Medial  Staging: Stage 2 -  Partial thickness loss of dermis presenting as a shallow open injury with a red, pink wound bed without slough.  Wound Description (Comments):   Present on Admission:   Dressing Type Foam - Lift dressing to assess site every shift 07/02/21 2000    DVT prophylaxis:enoxaparin (LOVENOX) injection 40 mg Start: 07/02/21 2000 SCDs Start: 06/27/21 1518     Code Status: Full Code  Family Communication: None at the bedside  Patient status: Inpatient  Patient is from : Home  Anticipated discharge to: LTAC  Estimated DC date: Not sure   Consultants: PCCM, cardiology, cardiothoracic surgery, neurology, ID  Procedures: As above  Antimicrobials:  Anti-infectives (From admission, onward)    Start     Dose/Rate Route Frequency Ordered Stop   06/30/21 1600   vancomycin (VANCOREADY) IVPB 1750 mg/350 mL  Status:  Discontinued        1,750 mg 175 mL/hr over 120 Minutes Intravenous Every 24 hours 06/29/21 1429 06/30/21 0746   06/30/21 1600  vancomycin (VANCOREADY) IVPB 1250 mg/250 mL  Status:  Discontinued        1,250 mg 166.7 mL/hr over 90 Minutes Intravenous Every 24 hours 06/30/21 0746 06/30/21 1351   06/30/21 1500  nafcillin 12 g in sodium chloride 0.9 % 500 mL continuous infusion        12 g 20.8 mL/hr over 24 Hours Intravenous Every 24 hours 06/30/21 1351     06/29/21 1515  vancomycin (VANCOREADY) IVPB 2000 mg/400 mL        2,000 mg 200 mL/hr over 120 Minutes Intravenous  Once 06/29/21 1416 06/29/21 1700   06/27/21 2215  vancomycin (VANCOCIN) IVPB 1000 mg/200 mL premix        1,000 mg 200 mL/hr over 60 Minutes Intravenous  Once 06/27/21 1517 06/27/21 2359   06/27/21 1615  ceFAZolin (ANCEF) IVPB 2g/100 mL premix        2 g 200 mL/hr over 30 Minutes Intravenous Every 8 hours 06/27/21 1517 06/29/21 0538   06/27/21 0400  vancomycin (VANCOCIN) 1,500 mg in sodium chloride 0.9 % 250 mL IVPB        1,500 mg 132.5 mL/hr over 120 Minutes Intravenous To Surgery 06/26/21 1345 06/27/21 1135   06/27/21 0400  ceFAZolin (ANCEF) IVPB 2g/100 mL premix        2 g 200 mL/hr over 30 Minutes Intravenous To Surgery 06/26/21 1345 06/27/21 0945   06/27/21 0400  ceFAZolin (ANCEF) IVPB 2g/100 mL premix  2 g 200 mL/hr over 30 Minutes Intravenous To Surgery 06/26/21 1345 06/27/21 1419   06/20/21 2200  vancomycin (VANCOREADY) IVPB 1250 mg/250 mL  Status:  Discontinued        1,250 mg 166.7 mL/hr over 90 Minutes Intravenous Every 24 hours 06/19/21 2203 06/19/21 2210   06/20/21 1800  nafcillin 12 g in sodium chloride 0.9 % 500 mL continuous infusion  Status:  Discontinued        12 g 20.8 mL/hr over 24 Hours Intravenous Every 24 hours 06/20/21 1005 06/29/21 1411   06/20/21 1000  vancomycin (VANCOREADY) IVPB 750 mg/150 mL  Status:  Discontinued        750  mg 150 mL/hr over 60 Minutes Intravenous Every 12 hours 06/19/21 2210 06/20/21 1005   06/20/21 0800  acyclovir (ZOVIRAX) 820 mg in dextrose 5 % 250 mL IVPB  Status:  Discontinued        10 mg/kg  81.9 kg 266.4 mL/hr over 60 Minutes Intravenous Every 12 hours 06/19/21 2207 06/20/21 1005   06/20/21 0600  cefTRIAXone (ROCEPHIN) 2 g in sodium chloride 0.9 % 100 mL IVPB  Status:  Discontinued        2 g 200 mL/hr over 30 Minutes Intravenous Every 12 hours 06/19/21 1825 06/20/21 1005   06/19/21 1830  acyclovir (ZOVIRAX) 820 mg in dextrose 5 % 250 mL IVPB        10 mg/kg  81.8 kg (Order-Specific) 266.4 mL/hr over 60 Minutes Intravenous STAT 06/19/21 1817 06/19/21 1956   06/19/21 1830  vancomycin (VANCOCIN) 1,750 mg in sodium chloride 0.9 % 500 mL IVPB        1,750 mg 258.8 mL/hr over 120 Minutes Intravenous STAT 06/19/21 1817 06/20/21 0000   06/19/21 1730  vancomycin (VANCOCIN) IVPB 1000 mg/200 mL premix  Status:  Discontinued        1,000 mg 200 mL/hr over 60 Minutes Intravenous STAT 06/19/21 1725 06/19/21 1817   06/19/21 1700  cefTRIAXone (ROCEPHIN) 2 g in sodium chloride 0.9 % 100 mL IVPB        2 g 200 mL/hr over 30 Minutes Intravenous  Once 06/19/21 1655 06/19/21 1826       Subjective: Patient seen and examined at the bedside this morning.  Lying in bed.  Hemodynamically stable.  Looks comfortable.  Eyes open but does not respond that much, aphasic, tries to obese, and by moving her left hand, severely weak on the right side.  Still has rectal tube with diarrhea.  Looks volume overloaded  Objective: Vitals:   07/03/21 0000 07/03/21 0409 07/03/21 0550 07/03/21 0749  BP:  111/67  118/70  Pulse:  90  91  Resp: (!) 21 (!) 21  (!) 28  Temp:  98.7 F (37.1 C)  99.2 F (37.3 C)  TempSrc:  Oral  Axillary  SpO2:  99%  100%  Weight:   93.7 kg   Height:        Intake/Output Summary (Last 24 hours) at 07/03/2021 0811 Last data filed at 07/03/2021 0700 Gross per 24 hour  Intake 684.73  ml  Output 2690 ml  Net -2005.27 ml   Filed Weights   07/01/21 0100 07/02/21 0417 07/03/21 0550  Weight: 96.9 kg 92.9 kg 93.7 kg    Examination:  General exam: Overall comfortable, not in distress, very deconditioned, lying in bed HEENT: PERRL, core track Respiratory system:  no wheezes or crackles  Cardiovascular system: S1 & S2 heard, RRR.  Linear surgical incision on the  chest Gastrointestinal system: Abdomen is nondistended, soft and nontender. Central nervous system: Alert and awake, aphasic, follows commands by moving her left hand, severely weak on the right side  extremities: Trace bilateral lower extremity edema, no clubbing ,no cyanosis, PICC line on the right upper extremity Skin: No rashes, no ulcers,no icterus   GU: Rectal tube   Data Reviewed: I have personally reviewed following labs and imaging studies  CBC: Recent Labs  Lab 06/29/21 0410 06/30/21 0417 06/30/21 2318 07/01/21 0407 07/02/21 0301 07/03/21 0430  WBC 33.6* 30.8*  --  19.2* 19.9* 17.4*  HGB 9.6* 9.4* 8.8* 8.7* 8.8* 8.7*  HCT 28.0* 28.5* 26.0* 26.7* 27.2* 26.0*  MCV 83.1 84.8  --  84.5 86.3 84.7  PLT 269 333  --  321 351 073   Basic Metabolic Panel: Recent Labs  Lab 06/27/21 0400 06/27/21 0939 06/28/21 0300 06/28/21 0401 06/28/21 1717 06/29/21 0410 06/29/21 1452 06/30/21 0417 06/30/21 2318 06/30/21 2330 07/01/21 0406 07/01/21 1700 07/02/21 0301 07/03/21 0430  NA 143   < > 139   < > 142 139   < > 138   < > 141 140 141 141 142  K 3.4*   < > 3.4*   < > 3.0* 3.1*   < > 3.5   < > 2.6* 3.0* 2.9* 3.4* 3.8  CL 112*   < > 109  --  113* 112*   < > 111  --  114* 111 113* 113* 113*  CO2 25   < > 21*  --  22 21*   < > 19*  --  19* 20* 21* 22 24  GLUCOSE 101*   < > 204*  --  135* 135*   < > 118*  --  135* 113* 122* 129* 138*  BUN 15   < > 14  --  13 14   < > 16  --  20 20 19 19 19   CREATININE 0.89   < > 0.78  --  0.81 0.82   < > 1.05*  --  1.02* 0.99 0.91 0.95 0.77  CALCIUM 8.3*   < > 8.0*   --  8.2* 8.1*   < > 8.4*  --  7.7* 8.1* 8.2* 8.2* 7.8*  MG 2.3  --  2.7*  --  2.2 2.1  --  2.0  --   --   --   --   --   --   PHOS 3.4  --   --   --   --   --   --   --   --   --   --   --   --   --    < > = values in this interval not displayed.     Recent Results (from the past 240 hour(s))  Body fluid culture w Gram Stain     Status: None   Collection Time: 06/23/21 11:34 AM   Specimen: Body Fluid  Result Value Ref Range Status   Specimen Description FLUID  Final   Special Requests SYNOVIAL LEFT KNEE  Final   Gram Stain   Final    RARE WBC PRESENT,BOTH PMN AND MONONUCLEAR NO ORGANISMS SEEN    Culture   Final    RARE STAPHYLOCOCCUS AUREUS CRITICAL RESULT CALLED TO, READ BACK BY AND VERIFIED WITH: RN A.STANLEY AT 7106 ON 06/24/2021 BY T.SAAD. Performed at [facility], [place] [address] [place], [place] [address]    Report Status 06/25/2021 FINAL  Final   Organism ID, Bacteria STAPHYLOCOCCUS AUREUS  Final      Susceptibility   Staphylococcus aureus - MIC*    CIPROFLOXACIN <=0.5 SENSITIVE Sensitive     ERYTHROMYCIN <=0.25 SENSITIVE Sensitive     GENTAMICIN <=0.5 SENSITIVE Sensitive     OXACILLIN <=0.25 SENSITIVE Sensitive     TETRACYCLINE <=1 SENSITIVE Sensitive     VANCOMYCIN 1 SENSITIVE Sensitive     TRIMETH/SULFA <=10 SENSITIVE Sensitive     CLINDAMYCIN <=0.25 SENSITIVE Sensitive     RIFAMPIN <=0.5 SENSITIVE Sensitive     Inducible Clindamycin NEGATIVE Sensitive     * RARE STAPHYLOCOCCUS AUREUS  Culture, Respiratory w Gram Stain     Status: None   Collection Time: 06/24/21  8:12 AM   Specimen: Tracheal Aspirate; Respiratory  Result Value Ref Range Status   Specimen Description TRACHEAL ASPIRATE  Final   Special Requests NONE  Final   Gram Stain   Final    RARE WBC PRESENT, PREDOMINANTLY PMN NO ORGANISMS SEEN Performed at [facility], [place] [address] [place], [place] [address]    Culture FEW CANDIDA ALBICANS  Final   Report Status 06/26/2021 FINAL  Final   Surgical PCR screen     Status: Abnormal   Collection Time: 06/26/21 10:56 PM   Specimen: Nasal Mucosa; Nasal Swab  Result Value Ref Range Status   MRSA, PCR POSITIVE (A) NEGATIVE Final    Comment: RESULT CALLED TO, READ BACK BY AND VERIFIED WITH: RN JONATHAN SMITH 06/27/21@00 :23 BY TW    Staphylococcus aureus POSITIVE (A) NEGATIVE Final    Comment: (NOTE) The Xpert SA Assay (FDA approved for NASAL specimens in patients 104 years of age and older), is one component of a comprehensive surveillance program. It is not intended to diagnose infection nor to guide or monitor treatment. Performed at [facility], [place] [address] [place], [place] [address]   Fungus Culture With Stain     Status: None (Preliminary result)   Collection Time: 06/27/21 11:32 AM   Specimen: PATH Other; Tissue  Result Value Ref Range Status   Fungus Stain Final report  Final    Comment: (NOTE) Performed At: [facility] [place], [place] [id number] Rush Farmer MD ZH:[id number]    Fungus (Mycology) Culture PENDING  Incomplete   Fungal Source MITRAL VALVE  Final    Comment: Performed at [facility], [place] [address], [place], [place] [address]  Aerobic/Anaerobic Culture w Gram Stain (surgical/deep wound)     Status: None (Preliminary result)   Collection Time: 06/27/21 11:32 AM   Specimen: PATH Other; Tissue  Result Value Ref Range Status   Specimen Description TISSUE  Final   Special Requests MITRAL VALVE VEGETATION  Final   Gram Stain NO WBC SEEN NO ORGANISMS SEEN   Final   Culture   Final    RARE STAPHYLOCOCCUS EPIDERMIDIS CRITICAL RESULT CALLED TO, READ BACK BY AND VERIFIED WITH: RN [place].P AT 1203 ON 06/29/2021 BY T.SAAD. NO ANAEROBES ISOLATED CULTURE REINCUBATED FOR BETTER GROWTH Performed at [facility], [place] [address] [place],  [address]    Report Status PENDING  Incomplete   Organism ID, Bacteria STAPHYLOCOCCUS EPIDERMIDIS  Final       Susceptibility   Staphylococcus epidermidis - MIC*    CIPROFLOXACIN <=0.5 SENSITIVE Sensitive     ERYTHROMYCIN >=8 RESISTANT Resistant     GENTAMICIN <=0.5 SENSITIVE Sensitive     OXACILLIN <=0.25 SENSITIVE Sensitive  TETRACYCLINE >=16 RESISTANT Resistant     VANCOMYCIN 2 SENSITIVE Sensitive     TRIMETH/SULFA <=10 SENSITIVE Sensitive     CLINDAMYCIN >=8 RESISTANT Resistant     RIFAMPIN <=0.5 SENSITIVE Sensitive     Inducible Clindamycin NEGATIVE Sensitive     * RARE STAPHYLOCOCCUS EPIDERMIDIS  Acid Fast Smear (AFB)     Status: None   Collection Time: 06/27/21 11:32 AM   Specimen: PATH Other; Tissue  Result Value Ref Range Status   AFB Specimen Processing Concentration  Final   Acid Fast Smear Negative  Final    Comment: (NOTE) Performed At: [facility] [place], [place] [id number] Rush Farmer MD HX:[id number]    Source (AFB) MITRAL VALVE  Final    Comment: Performed at [facility], [place] [address] [place], [place] [address]  Fungus Culture Result     Status: None   Collection Time: 06/27/21 11:32 AM  Result Value Ref Range Status   Result 1 Comment  Final    Comment: (NOTE) KOH/Calcofluor preparation:  no fungus observed. Performed At: [facility] [place], [place] [id number] Rush Farmer MD LJ:[id number]      Radiology Studies: DG Chest Port 1 View  Result Date: 07/02/2021 CLINICAL DATA:  Status post mitral valve repair EXAM: PORTABLE CHEST 1 VIEW COMPARISON:  Chest x-ray dated July 01, 2021 FINDINGS: Stable position of feeding tube and right IJ sheath. Cardiac and mediastinal contours are unchanged post median sternotomy. Mild bilateral interstitial opacities, likely due to pulmonary edema. Bibasilar atelectasis. Possible trace right pleural effusion. No evidence of pneumothorax. IMPRESSION: 1. New mild bilateral interstitial opacities, likely due to pulmonary edema. 2. Possible trace right pleural effusion.  Electronically Signed   By: Yetta Glassman M.D.   On: 07/02/2021 08:28   [place] EKG SITE RITE  Result Date: 07/02/2021 If Site Rite image not attached, placement could not be confirmed due to current cardiac rhythm.   Scheduled Meds:  amiodarone  200 mg Per Tube BID   aspirin  81 mg Per Tube Daily   benztropine  1 mg Per Tube QHS   bisacodyl  10 mg Rectal Daily   Chlorhexidine Gluconate Cloth  6 each Topical Daily   enoxaparin (LOVENOX) injection  40 mg Subcutaneous Q24H   feeding supplement (PROSource TF)  45 mL Per Tube TID   fluPHENAZine  1 mg Per Tube QHS   furosemide  40 mg Intravenous BID   insulin aspart  0-24 Units Subcutaneous Q6H   metoprolol tartrate  12.5 mg Oral BID   Or   metoprolol tartrate  12.5 mg Per Tube BID   mouth rinse  15 mL Mouth Rinse 4 times per day   pantoprazole (PROTONIX) IV  40 mg Intravenous Daily   potassium chloride  40 mEq Per Tube TID   sodium chloride flush  10-40 mL Intracatheter Q12H   sodium chloride flush  10-40 mL Intracatheter Q12H   sodium chloride flush  3 mL Intravenous Q12H   vitamin B-12  1,000 mcg Per Tube Daily   Continuous Infusions:  sodium chloride Stopped (06/27/21 2102)   sodium chloride     sodium chloride Stopped (07/02/21 1046)   feeding supplement (OSMOLITE 1.5 CAL) 1,000 mL (07/02/21 2346)   lactated ringers     lactated ringers     nafcillin 12 g in sodium chloride 0.9 % 500 mL continuous infusion 20.8 mL/hr at 07/02/21 1200     LOS: 14 days  Shelly Coss, MD [facility] P6/22/2023, 8:11 AM

## 2021-07-03 NOTE — Progress Notes (Signed)
Speech Language Pathology Treatment: Dysphagia;Cognitive-Linquistic  Debra Klein Details Name: Debra Klein MRN: [id number] DOB: October 22, 1975 Today's Date: 07/03/2021 Time: 9169-4503 SLP Time Calculation (min) (ACUTE ONLY): 26 min  Assessment / Plan / Recommendation Clinical Impression  Pt found with eyes wide open, thick standing oral secretions, initially no verbal responses or command following. SLP provided oral care and mentioned ice. Pt immediately states Yes, I want ice. SLP administered bites of ice with immediate oral manipulation and swallow response. Pt reached out of water though use of left UE poorly controlled, particularly when stopping a motivating action, she slams her hand down. Pt able to take sips of water followed by immediate throat clearing or coughing. Fed pt puree with a spoon with hand over hand assist with some initiation to grip spoon, but mostly pt impulsively reaching for the applesauce cup. No signs of aspiration with puree. Pt was able to read/name applesauce and repeat the word spoon with multiple repetitions of request. If verbalizations or commands are not related to oral intake pt does not respond. Unclear if this is aphasia related, or attention related, but suspect the latter. Pt is ready for MBS. Hopeful for diet initiation given pts motivation for food.    HPI HPI: Debra Klein is a 46 y.o. female with PMH: HTN, HLD, iron deficiency, anemia, schizophrenia who presented to the hospital on 06/17/21 to ED for evaluation of AMS. Debra Klein initially non-verbal but awake and alert in ED. Mother reported Debra Klein as ambulatory and verbal at baseline but had not been eating or drinking for past two weeks (also not taking medications). She has had multiple falls, seen by orthopedic day before admission and questioned possible fracture left ankle and placed in Cam boot. In ED, Debra Klein admitted with severe sepsis of unknown source; MRI showed large Left MCA infarct. Debra Klein required  intubation on 6/8-6/15/23 and was intubated for surgical procedure (mitral valve repair) on 6/16, extubated 6/18. Cortrak 6/19.      SLP Plan  MBS      Recommendations for follow up therapy are one component of a multi-disciplinary discharge planning process, led by the attending physician.  Recommendations may be updated based on Debra Klein status, additional functional criteria and insurance authorization.    Recommendations                   Follow Up Recommendations: Skilled nursing-short term rehab (<3 hours/day) Plan: MBS           Afifa Truax, Katherene Ponto  07/03/2021, 9:45 AM

## 2021-07-03 NOTE — Progress Notes (Signed)
Mobility Specialist: Progress Note   07/03/21 1801  Mobility  Activity Transferred from chair to bed  Level of Assistance +2 (takes two people)  Assistive Device MaxiMove  LLE Weight Bearing WBAT  Activity Response Tolerated well  $Mobility charge 1 Mobility   Pt transferred back to the bed, no c/o during transfer. Pt back in bed with call bell at her side. Bed alarm is on.   [facility] Mobility Specialist Mobility Specialist [address]: [phone]

## 2021-07-03 NOTE — Progress Notes (Addendum)
Physical Therapy Treatment Patient Details Name: Debra Klein MRN: [id number] DOB: Aug 08, 1975 Today's Date: 07/03/2021   History of Present Illness Pt is a 46 y.o. female admitted 06/19/2021 with AMS, poor PO intake and recent falls, one resulting in L ankle fx. Workup for severe sepsis, septic arthritis of L knee. MRI 6/8 shows large L MCA infarct. S/p L knee arthroscopy with debridement and synovectomy 6/13. Pt intubated 6/8-6/14. Reintubated for MVR on 6/16; extubated 6/18. Cortrak placed 6/19, pt pulled it out 6/22. PMH includes schizophrenia, HTN, anemia.   PT Comments    Pt slowly progressing with mobility. Today's session focused on OOB transfer with maximove lift. Pt requiring totalA for all mobility this session; pt keeping eyes open and tracking to both sides, but no verbalizations or command following - question if pt more interactive/alert earlier in day. Pt's mother present, just finished assisting pt to eat lunch, reports pt with some words to her. Will continue to follow acutely to address established goals.    Recommendations for follow up therapy are one component of a multi-disciplinary discharge planning process, led by the attending physician.  Recommendations may be updated based on patient status, additional functional criteria and insurance authorization.  Follow Up Recommendations  PT at [facility]     Assistance Recommended at Discharge Frequent or constant Supervision/Assistance  Patient can return home with the following Two people to help with walking and/or transfers;Two people to help with bathing/dressing/bathroom;Assistance with cooking/housework;Assistance with feeding;Direct supervision/assist for medications management;Direct supervision/assist for financial management;Assist for transportation;Help with stairs or ramp for entrance   Equipment Recommendations  Hospital bed;Wheelchair (measurements PT);Wheelchair cushion (measurements  PT);Other (comment) (hoyer lift)    Recommendations for Other Services       Precautions / Restrictions Precautions Precautions: Fall;Sternal;Other (comment) Precaution Comments: flexiseal Restrictions Weight Bearing Restrictions: Yes LLE Weight Bearing: Weight bearing as tolerated Other Position/Activity Restrictions: pt OK to not wear CAM boot per Dr. Ninfa Linden     Mobility  Bed Mobility Overal bed mobility: Needs Assistance Bed Mobility: Rolling Rolling: Total assist         General bed mobility comments: totalA for rolling R/L for maximove pad placement, pt reaching for rail with LUE once on side but requiring hand over hand assist to release rail for return to supine    Transfers Overall transfer level: Needs assistance   Transfers: Bed to chair/wheelchair/BSC             General transfer comment: transfer from bed to recliner via maximove lift, pt not keeping eyes open otherwise minimally responsive with movement; totalA for anterior weight translation in recliner, standing attempts deferred Transfer via Lift Equipment: Maximove  Ambulation/Gait                   Stairs             Wheelchair Mobility    Modified Rankin (Stroke Patients Only) Modified Rankin (Stroke Patients Only) Pre-Morbid Rankin Score: No symptoms Modified Rankin: Severe disability     Balance Overall balance assessment: Needs assistance     Sitting balance - Comments: reliant on totalA to maintain static sitting edge of recliner Postural control: Right lateral lean, Posterior lean                                  Cognition Arousal/Alertness: Awake/alert Behavior During Therapy: Flat affect Overall Cognitive Status: Impaired/Different from baseline  General Comments: pt with no verbalizations this session (as opposed to this morning, when PT came by room to introduce self, pt stating, "I want water");  pt grunting a couple of times in response to questions. pt not following any verbal commands, only purposeful movement was automatic response opening mouth when yaunker brought to lips and grabbing bed rail with LUE with rolling. mother present at beginning of session, reports pt talking to her some, including stating mother's name and asking for water/food        Exercises Other Exercises Other Exercises: multiple attempts to engage BUE/BLE movement during session, only active movement noted was resistance with LUE movement (pt pushing against back of bed and chair with elbow) and pt inconsistent with ability to grasp with L hand; PROM to bilateral knees, no active movement noted. pt attending to R and L sides with neck movement and gaze, inconsistent    General Comments General comments (skin integrity, edema, etc.): pt's mother Debra Klein) present at beginning of session, had recently finished helping pt eat meal; noted pt with secretions/food in mouth and attempting to clear throat during session, NT hooked up suction with yaunker to allow for use during session. HR 90s, session performed on RA      Pertinent Vitals/Pain Pain Assessment Pain Assessment: Faces Faces Pain Scale: Hurts a little bit Pain Location: pt resistant to movement with LUE, no grimacing noted though Pain Descriptors / Indicators: Guarding Pain Intervention(s): Monitored during session, Repositioned    Home Living                          Prior Function            PT Goals (current goals can now be found in the care plan section) Acute Rehab PT Goals Patient Stated Goal: mother states "I want to see her walk again when it's time" PT Goal Formulation: With family Time For Goal Achievement: 07/17/21 Potential to Achieve Goals: Fair Progress towards PT goals: Not progressing toward goals - comment    Frequency    Min 3X/week      PT Plan Frequency needs to be updated    Co-evaluation               AM-PAC PT "6 Clicks" Mobility   Outcome Measure  Help needed turning from your back to your side while in a flat bed without using bedrails?: Total Help needed moving from lying on your back to sitting on the side of a flat bed without using bedrails?: Total Help needed moving to and from a bed to a chair (including a wheelchair)?: Total Help needed standing up from a chair using your arms (e.g., wheelchair or bedside chair)?: Total Help needed to walk in hospital room?: Total Help needed climbing 3-5 steps with a railing? : Total 6 Click Score: 6    End of Session   Activity Tolerance: Patient limited by fatigue Patient left: in chair;with call bell/phone within reach Nurse Communication: Mobility status;Need for lift equipment PT Visit Diagnosis: Other abnormalities of gait and mobility (R26.89);Muscle weakness (generalized) (M62.81);Hemiplegia and hemiparesis     Time: 6045-4098 PT Time Calculation (min) (ACUTE ONLY): 37 min  Charges:  $Therapeutic Activity: 23-37 mins                     Mabeline Caras, PT, DPT Acute Rehabilitation Services  Pager [phone] Office [place] 07/03/2021, 5:42 PM

## 2021-07-03 NOTE — Progress Notes (Signed)
Modified Barium Swallow Progress Note  Patient Details  Name: Debra Klein MRN: [id number] Date of Birth: [birth year]  Today's Date: 07/03/2021  Modified Barium Swallow completed.  Full report located under Chart Review in the Imaging Section.  Brief recommendations include the following:  Clinical Impression  Pt presents with a moderate oral dyspahgia due to decreased motor planning including groping behaviors with more complex oral intake such as sipping from a straw or sealing lips to cup edge at times. Mastication somewhat slow and labored in these intial trials, though no singificant oral residue present and eventual upgrade to regualr solids anticipated. Pt is very impulsive and will drink consecutively from straw with late airway protection with thin liquids. Pt has cough without ejection when drinking thins. A chin tuck appears to help, but total assist needed for position. This may be helpful for future therapeutic efforts. When drinking nectar thick liquids there is still penetration of nectar to the vestibule with ejection. At times mild penetrate remains that mixes with secretions and sits on glottis. Pt uses a throat clear and ejects most penetrate though trace aspiration did occur x1. Recommend pt initiate a dys 2/nectar thick diet with expectation of improvement as secretion management is more automatic. WIll f/u for tolerance.   Swallow Evaluation Recommendations       SLP Diet Recommendations: Dysphagia 2 (Fine chop) solids;Nectar thick liquid   Liquid Administration via: Cup;Straw   Medication Administration: Crushed with puree   Supervision: Staff to assist with self feeding;Full supervision/cueing for compensatory strategies   Compensations: Slow rate;Small sips/bites   Postural Changes: Seated upright at 90 degrees   Oral Care Recommendations: Oral care BID      Herbie Baltimore, MA CCC-SLP  Acute Rehabilitation Services Secure Chat Preferred Office  [phone]   Lynann Beaver 07/03/2021,11:38 AM

## 2021-07-03 NOTE — Progress Notes (Signed)
IP rehab admissions - I met with patient and her mother at the bedside.  Mom would like inpatient rehab here at [facility].  Mom prefers patient not go to a SNF.  Explained that patient needs to participate more fully with therapies and needs to demonstrate increased therapy tolerance before we can consider for CIR.  At the current level of max to total assist, insurance will not approve and patient could not tolerate the 3 hours of therapy a day as required on CIR.  We will continue to follow progress.  Call me for questions.  #[phone] 

## 2021-07-03 NOTE — Progress Notes (Addendum)
      [address]       [place] [address]             [phone]      6 Days Post-Op Procedure(s) (LRB): MITRAL VALVE REPAIR USING SIMUFORM SEMI-RIGID ANNULOPLASTY RING (N/A) TRANSESOPHAGEAL ECHOCARDIOGRAM (TEE) (N/A) Subjective:  Transferred to 4E and to inpatient service yesterday. She was alert and answering questions yesterday per RN. Awake this morning but will not respond.  Tube feeding infusing via CorTrak.   Objective: Vital signs in last 24 hours: Temp:  [97.6 F (36.4 C)-99.9 F (37.7 C)] 99.2 F (37.3 C) (06/22 0749) Pulse Rate:  [82-96] 91 (06/22 0749) Cardiac Rhythm: Normal sinus rhythm (06/21 2000) Resp:  [18-29] 28 (06/22 0749) BP: (106-124)/(64-73) 118/70 (06/22 0749) SpO2:  [99 %-100 %] 100 % (06/22 0749) Weight:  [93.7 kg] 93.7 kg (06/22 0550)    Intake/Output from previous day: 06/21 0701 - 06/22 0700 In: 760.6 [I.V.:30.7; NG/GT:275; IV Piggyback:454.8] Out: 2730 [Urine:2190; Emesis/NG output:240; Stool:300] Intake/Output this shift: No intake/output data recorded.  General appearance: alert but not responsive to questions.  Heart: stable SR. Lungs: breath sounds are clear anterior, good oxygenation on RA.  Abdomen: soft, no tenderness. Active bowel sounds Extremities: warm, heel protectors and SCD's in place to LE's Wound: the sternootomy incision is intact and dry  Lab Results: Recent Labs    07/02/21 0301 07/03/21 0430  WBC 19.9* 17.4*  HGB 8.8* 8.7*  HCT 27.2* 26.0*  PLT 351 381   BMET:  Recent Labs    07/02/21 0301 07/03/21 0430  NA 141 142  K 3.4* 3.8  CL 113* 113*  CO2 22 24  GLUCOSE 129* 138*  BUN 19 19  CREATININE 0.95 0.77  CALCIUM 8.2* 7.8*    PT/INR:  Recent Labs    07/03/21 0430  LABPROT 15.1  INR 1.2   ABG    Component Value Date/Time   PHART 7.442 06/30/2021 2318   HCO3 18.2 (L) 06/30/2021 2318   TCO2 19 (L) 06/30/2021 2318   ACIDBASEDEF 5.0 (H) 06/30/2021 2318   O2SAT 70.5  07/01/2021 0407   CBG (last 3)  Recent Labs    07/03/21 0025 07/03/21 0621 07/03/21 0750  GLUCAP 114* 110* 131*    Assessment/Plan: S/P Procedure(s) (LRB): MITRAL VALVE REPAIR USING SIMUFORM SEMI-RIGID ANNULOPLASTY RING (N/A) TRANSESOPHAGEAL ECHOCARDIOGRAM (TEE) (N/A)  -POD6 mitral valve repair for endocarditis.  Had pre-op CVA. Stable VS and cardiac rhythm. IV nafcillin through 7/28 per ID.  PICC placed yesterday.  The inpatient medicine team has resume primary care. CT surgery will continue to follow.     LOS: 14 days    Leary Roca, [place] [phone] 07/03/2021  Patient seen and examined, agree with above WBC trending down slowly Wounds oK Will dc pacing wires as she is maintaining SR  Viviann Spare C. Dorris Fetch, MD [facility] [phone]

## 2021-07-03 NOTE — Progress Notes (Signed)
Patient found pulling coretrek out. Patient was offered water and a dysphagia diet while sitting at 90 degrees in bed. Patient was able to swallow. Patient coughed and the coretrek tubing slid out of her nose. This RN removed the blue clip from pts nose. Mittens placed on bilateral hands for safety of other lines. Mother present. Will continue to monitor. MD made aware.  Daymon Larsen, RN

## 2021-07-04 DIAGNOSIS — A4101 Sepsis due to Methicillin susceptible Staphylococcus aureus: Secondary | ICD-10-CM | POA: Diagnosis not present

## 2021-07-04 DIAGNOSIS — G9341 Metabolic encephalopathy: Secondary | ICD-10-CM | POA: Diagnosis not present

## 2021-07-04 DIAGNOSIS — R6520 Severe sepsis without septic shock: Secondary | ICD-10-CM | POA: Diagnosis not present

## 2021-07-04 LAB — CBC
HCT: 25.7 % — ABNORMAL LOW (ref 36.0–46.0)
Hemoglobin: 8.5 g/dL — ABNORMAL LOW (ref 12.0–15.0)
MCH: 28.6 pg (ref 26.0–34.0)
MCHC: 33.1 g/dL (ref 30.0–36.0)
MCV: 86.5 fL (ref 80.0–100.0)
Platelets: 382 10*3/uL (ref 150–400)
RBC: 2.97 MIL/uL — ABNORMAL LOW (ref 3.87–5.11)
RDW: 20.3 % — ABNORMAL HIGH (ref 11.5–15.5)
WBC: 16.4 10*3/uL — ABNORMAL HIGH (ref 4.0–10.5)
nRBC: 0 % (ref 0.0–0.2)

## 2021-07-04 LAB — GLUCOSE, CAPILLARY
Glucose-Capillary: 102 mg/dL — ABNORMAL HIGH (ref 70–99)
Glucose-Capillary: 102 mg/dL — ABNORMAL HIGH (ref 70–99)
Glucose-Capillary: 103 mg/dL — ABNORMAL HIGH (ref 70–99)
Glucose-Capillary: 107 mg/dL — ABNORMAL HIGH (ref 70–99)

## 2021-07-04 LAB — BASIC METABOLIC PANEL
Anion gap: 7 (ref 5–15)
BUN: 18 mg/dL (ref 6–20)
CO2: 26 mmol/L (ref 22–32)
Calcium: 8.4 mg/dL — ABNORMAL LOW (ref 8.9–10.3)
Chloride: 109 mmol/L (ref 98–111)
Creatinine, Ser: 0.82 mg/dL (ref 0.44–1.00)
GFR, Estimated: >60 mL/min (ref >60)
Glucose, Bld: 115 mg/dL — ABNORMAL HIGH (ref 70–99)
Potassium: 3.7 mmol/L (ref 3.5–5.1)
Sodium: 142 mmol/L (ref 135–145)

## 2021-07-04 LAB — PROTIME-INR
INR: 1.3 — ABNORMAL HIGH (ref 0.8–1.2)
Prothrombin Time: 16.3 seconds — ABNORMAL HIGH (ref 11.4–15.2)

## 2021-07-04 LAB — AEROBIC/ANAEROBIC CULTURE W GRAM STAIN (SURGICAL/DEEP WOUND): Gram Stain: NONE SEEN

## 2021-07-04 MED ORDER — LOPERAMIDE HCL 2 MG PO CAPS
2.0000 mg | ORAL_CAPSULE | Freq: Four times a day (QID) | ORAL | Status: DC | PRN
Start: 2021-07-04 — End: 2021-07-18

## 2021-07-04 MED ORDER — FUROSEMIDE 40 MG PO TABS
40.0000 mg | ORAL_TABLET | Freq: Every day | ORAL | Status: DC
  Administered 2021-07-04 – 2021-07-07 (×4): 40 mg via ORAL
  Filled 2021-07-04 (×4): qty 1

## 2021-07-05 DIAGNOSIS — G929 Unspecified toxic encephalopathy: Secondary | ICD-10-CM

## 2021-07-05 DIAGNOSIS — R7401 Elevation of levels of liver transaminase levels: Secondary | ICD-10-CM

## 2021-07-05 DIAGNOSIS — A4101 Sepsis due to Methicillin susceptible Staphylococcus aureus: Secondary | ICD-10-CM | POA: Diagnosis not present

## 2021-07-05 DIAGNOSIS — I330 Acute and subacute infective endocarditis: Secondary | ICD-10-CM | POA: Diagnosis not present

## 2021-07-05 DIAGNOSIS — E872 Acidosis, unspecified: Secondary | ICD-10-CM

## 2021-07-05 DIAGNOSIS — L8990 Pressure ulcer of unspecified site, unspecified stage: Secondary | ICD-10-CM

## 2021-07-05 DIAGNOSIS — G9340 Encephalopathy, unspecified: Secondary | ICD-10-CM | POA: Diagnosis not present

## 2021-07-05 DIAGNOSIS — J9601 Acute respiratory failure with hypoxia: Secondary | ICD-10-CM | POA: Diagnosis not present

## 2021-07-05 LAB — CBC
HCT: 27.3 % — ABNORMAL LOW (ref 36.0–46.0)
Hemoglobin: 8.6 g/dL — ABNORMAL LOW (ref 12.0–15.0)
MCH: 28.1 pg (ref 26.0–34.0)
MCHC: 31.5 g/dL (ref 30.0–36.0)
MCV: 89.2 fL (ref 80.0–100.0)
Platelets: 425 10*3/uL — ABNORMAL HIGH (ref 150–400)
RBC: 3.06 MIL/uL — ABNORMAL LOW (ref 3.87–5.11)
RDW: 20.7 % — ABNORMAL HIGH (ref 11.5–15.5)
WBC: 15.2 10*3/uL — ABNORMAL HIGH (ref 4.0–10.5)
nRBC: 0 % (ref 0.0–0.2)

## 2021-07-05 LAB — GLUCOSE, CAPILLARY
Glucose-Capillary: 106 mg/dL — ABNORMAL HIGH (ref 70–99)
Glucose-Capillary: 109 mg/dL — ABNORMAL HIGH (ref 70–99)
Glucose-Capillary: 110 mg/dL — ABNORMAL HIGH (ref 70–99)
Glucose-Capillary: 128 mg/dL — ABNORMAL HIGH (ref 70–99)

## 2021-07-05 MED ORDER — CLOZAPINE 25 MG PO TABS
50.0000 mg | ORAL_TABLET | Freq: Every day | ORAL | Status: DC
Start: 2021-07-05 — End: 2021-07-08
  Administered 2021-07-05 – 2021-07-07 (×3): 50 mg via ORAL
  Filled 2021-07-05 (×3): qty 2

## 2021-07-05 NOTE — Progress Notes (Signed)
PROGRESS NOTE    Debra Klein  ZOX:[id number] DOB: [birth year] DOA: 06/19/2021 PCP: Blair Heys, MD   Brief Narrative:  Patient is a 46 year old female with history of schizophrenia , hypertension, hyperlipidemia who initially presented with acute encephalopathy.  No report of eating or drinking for 2 weeks, not taking medications, having multiple falls and recently sustained fracture on the left ankle, placed on cam boot.  She was found on the floor.  On presentation she was found to have fever, leukocytosis, AKI, elevated LFT, hyperkalemia, lactic acidosis.  Patient was started on IV fluids, empiric antibiotics and was admitted.  Her encephalopathy continued to deteriorate and she was finally intubated and was transferred to [facility] service.  CT head showed insular hypodensity concerning for MCA infarct.  Hospital course also remarkable for finding of bacteremia,mitral valve endocarditis, status post mitral valve repair.  ID was following.  Prolonged hospitalization.  Patient transferred to [facility] service on 6/22.  Currently hemodynamically stable.  PT/OT recommending CIR on discharge but now TOC  considering LTAC on discharge.  Medically stable for dc when possible.  6/7: Fall --> fibula fracture, Minimally verbal at an outpatient visit with [facility]  6/8: Presented to the ED nonverbal with altered mentation. Progressively worsening mentation with requirement of intubation for airway protection.  Empirically treated for sepsis, presumed meningitis. MRI with large MCA infarct and punctate right hemisphere infarcts.  6/9: Blood cultures obtained on arrival positive for MSSA  6/10 MRI knee demonstrates septic arthritis.  6/13 L knee arthroscopy with I&D with snyovectomy for septic arthritis, TEE 6/14 steroids for no cuff leak 6/15 extubated, much more alert 6/16 mitral valve repair. Left chest tube placed for pleural effusion  6/17 too weak to come off vent. Not following commands.  6/18  extubated 6/19 required BiPAP due to decreased responsiveness overnight after getting antipsychotic meds 6/20-present - medically stable; continue to follow    Assessment & Plan:   Principal Problem:   Sepsis (HCC) Active Problems:   Schizophrenia (HCC)   Transaminitis   AKI (acute kidney injury) (HCC)   Bacteremia due to methicillin susceptible Staphylococcus aureus (MSSA)   Toxic encephalopathy   Cerebrovascular accident (CVA) due to embolic occlusion of left middle cerebral artery (HCC)   Acute respiratory failure with hypoxia (HCC)   Septic arthritis of knee, left (HCC)   S/P mitral valve repair   Pressure injury of skin   Acute bacterial endocarditis  Sepsis/MSSA bacteremia/Staph epidermidis mitral valve endocarditis: ID consulted, continue nafcillin through 7/28.  Has a picc line on right upper extremity   Staph epidermidis mitral valve endocarditis: Cardiothoracic surgery following, status post mitral valve repair.   Acute metabolic encephalopathy:  Multifactorial secondary to sepsis/stroke.  Mental status has improved but has global aphasia now from a stroke.  Does not follow commands but will track with eyes   Left MCA stroke due to septic emboli:  Neurology previously following Mental status appears to be stable with minimal improvements over the past week Continues to have right-sided weakness, aphasic, does not follow commands consistently Continue aspirin   Brief episode of A-fib: Cardiothoracic surgery started on amiodarone.  Also on metoprolol.  Currently in normal sinus rhythm.  200 mg twice daily for 7 days --> 200 mg daily for 1 month if no arrhythmia may discontinue at that time.   Acute respiratory failure: Secondary to left-sided atelectasis, left-sided pleural effusion.  Currently on room air.  Encourage incentive spirometry ,flutter valve   Dysphagia: Secondary to stroke.  She was  on core track.  Speech therapy following, recommending dysphagia 2 diet,  core track taken out   Cardiogenic shock:  Suspected to be from mitral valve regurgitation.  Previously requiring inotropes and vasopressors and IV diuresis all discontinued  Continue p.o. Lasix  Severe hypokalemia:  In the setting of aggressive diuresis as above, improving with weaning of diuretics    Left knee septic arthritis:  Confirmed on MRI of the left knee on 6/10 - peritubular osteomyelitis with subcutaneous abscess.   Status post diagnostic arthrocentesis on 6/12.  Cultures showed MSSA.   Left knee arthroscopy, I&D, synovectomy successful Outpatient follow-up with orthopedics   Normocytic anemia:  Questionably anemia of chronic disease Currently hemoglobin stable.   Diarrhea:  Secondary to tube feedings, improving with Imodium   Prediabetes:  Hemoglobin A1c of 6.1.  Currently on sliding scale insulin   History of schizophrenia: Continue fluphenazine, clozapine  DVT prophylaxis: Lovenox Code Status: Full Family Communication: None present  Status is: Inpatient  Dispo: The patient is from: Home              Anticipated d/c is to: LTAC              Anticipated d/c date is: Imminent              Patient currently not medically stable for discharge  Consultants:  Cardiothoracic surgery, PCCM, infectious disease, orthopedic surgery  Procedures:  Mitral valve repair  Antimicrobials:  Nafcillin  Subjective: No acute issues or events overnight, review of systems limited given patient's inability to participate in conversation, continues to track movement but otherwise interview limited.  Objective: Vitals:   07/04/21 1630 07/04/21 2006 07/04/21 2338 07/05/21 0419  BP: 99/68 116/80 110/78 114/74  Pulse: 98 92 89 91  Resp: 18 20 20 20   Temp: 98.9 F (37.2 C) 98.6 F (37 C) (!) 97.4 F (36.3 C) 98.4 F (36.9 C)  TempSrc: Oral Oral Oral Oral  SpO2: 96% 97% 98% 98%  Weight:      Height:        Intake/Output Summary (Last 24 hours) at 07/05/2021  0811 Last data filed at 07/04/2021 1800 Gross per 24 hour  Intake 300 ml  Output 1900 ml  Net -1600 ml   Filed Weights   07/01/21 0100 07/02/21 0417 07/03/21 0550  Weight: 96.9 kg 92.9 kg 93.7 kg    Examination:  General exam: Overall comfortable, not in distress, very deconditioned, chronically ill looking, lying in bed not following commands but tracking movement HEENT: Without scleral injection or icterus Respiratory system:  no wheezes or crackles  Cardiovascular system: S1 & S2 heard, RRR.  Linear surgical incision wound on the chest Gastrointestinal system: Abdomen is nondistended, soft and nontender. Central nervous system: Alert and awake but has global aphasia, follows commands intermittently, moves her left hand more than right, does not move the legs Extremities: Trace edema in upper extremities, no clubbing ,no cyanosis Skin: No rashes, no edema, no icterus   GU: rectal tube  Data Reviewed: I have personally reviewed following labs and imaging studies  CBC: Recent Labs  Lab 07/01/21 0407 07/02/21 0301 07/03/21 0430 07/04/21 0446 07/05/21 0210  WBC 19.2* 19.9* 17.4* 16.4* 15.2*  HGB 8.7* 8.8* 8.7* 8.5* 8.6*  HCT 26.7* 27.2* 26.0* 25.7* 27.3*  MCV 84.5 86.3 84.7 86.5 89.2  PLT 321 351 381 382 425*   Basic Metabolic Panel: Recent Labs  Lab 06/28/21 1717 06/29/21 0410 06/29/21 1452 06/30/21 0417 06/30/21 2318 07/01/21 0406  07/01/21 1700 07/02/21 0301 07/03/21 0430 07/04/21 0446  NA 142 139   < > 138   < > 140 141 141 142 142  K 3.0* 3.1*   < > 3.5   < > 3.0* 2.9* 3.4* 3.8 3.7  CL 113* 112*   < > 111   < > 111 113* 113* 113* 109  CO2 22 21*   < > 19*   < > 20* 21* 22 24 26   GLUCOSE 135* 135*   < > 118*   < > 113* 122* 129* 138* 115*  BUN 13 14   < > 16   < > 20 19 19 19 18   CREATININE 0.81 0.82   < > 1.05*   < > 0.99 0.91 0.95 0.77 0.82  CALCIUM 8.2* 8.1*   < > 8.4*   < > 8.1* 8.2* 8.2* 7.8* 8.4*  MG 2.2 2.1  --  2.0  --   --   --   --   --   --     < > = values in this interval not displayed.   GFR: Estimated Creatinine Clearance: 98.9 mL/min (by C-G formula based on SCr of 0.82 mg/dL). Liver Function Tests: Recent Labs  Lab 06/30/21 0417  AST 24  ALT 6  ALKPHOS 48  BILITOT 1.2  PROT 5.9*  ALBUMIN 2.0*   No results for input(s): "LIPASE", "AMYLASE" in the last 168 hours. No results for input(s): "AMMONIA" in the last 168 hours. Coagulation Profile: Recent Labs  Lab 06/30/21 0810 07/01/21 0406 07/02/21 0301 07/03/21 0430 07/04/21 0446  INR 3.0* 1.8* 1.5* 1.2 1.3*   Cardiac Enzymes: No results for input(s): "CKTOTAL", "CKMB", "CKMBINDEX", "TROPONINI" in the last 168 hours. BNP (last 3 results) No results for input(s): "PROBNP" in the last 8760 hours. HbA1C: No results for input(s): "HGBA1C" in the last 72 hours. CBG: Recent Labs  Lab 07/04/21 0532 07/04/21 1118 07/04/21 1841 07/05/21 0045 07/05/21 0636  GLUCAP 103* 102* 102* 109* 110*   Lipid Profile: No results for input(s): "CHOL", "HDL", "LDLCALC", "TRIG", "CHOLHDL", "LDLDIRECT" in the last 72 hours. Thyroid Function Tests: No results for input(s): "TSH", "T4TOTAL", "FREET4", "T3FREE", "THYROIDAB" in the last 72 hours. Anemia Panel: No results for input(s): "VITAMINB12", "FOLATE", "FERRITIN", "TIBC", "IRON", "RETICCTPCT" in the last 72 hours. Sepsis Labs: No results for input(s): "PROCALCITON", "LATICACIDVEN" in the last 168 hours.  Recent Results (from the past 240 hour(s))  Surgical PCR screen     Status: Abnormal   Collection Time: 06/26/21 10:56 PM   Specimen: Nasal Mucosa; Nasal Swab  Result Value Ref Range Status   MRSA, PCR POSITIVE (A) NEGATIVE Final    Comment: RESULT CALLED TO, READ BACK BY AND VERIFIED WITH: RN JONATHAN SMITH 06/27/21@00 :23 BY TW    Staphylococcus aureus POSITIVE (A) NEGATIVE Final    Comment: (NOTE) The Xpert SA Assay (FDA approved for NASAL specimens in patients 57 years of age and older), is one component of a  comprehensive surveillance program. It is not intended to diagnose infection nor to guide or monitor treatment. Performed at [facility], [address], [place], [place] [address]   Fungus Culture With Stain     Status: None (Preliminary result)   Collection Time: 06/27/21 11:32 AM   Specimen: PATH Other; Tissue  Result Value Ref Range Status   Fungus Stain Final report  Final    Comment: (NOTE) Performed At: [facility] [address] [place], [place] [id number] Jolene Schimke  MD ZO:[id number]    Fungus (Mycology) Culture PENDING  Incomplete   Fungal Source MITRAL VALVE  Final    Comment: Performed at [facility], [address], [place], [place] [address]  Aerobic/Anaerobic Culture w Gram Stain (surgical/deep wound)     Status: None   Collection Time: 06/27/21 11:32 AM   Specimen: PATH Other; Tissue  Result Value Ref Range Status   Specimen Description TISSUE  Final   Special Requests MITRAL VALVE VEGETATION  Final   Gram Stain NO WBC SEEN NO ORGANISMS SEEN   Final   Culture   Final    RARE STAPHYLOCOCCUS EPIDERMIDIS RARE STAPHYLOCOCCUS AUREUS CRITICAL RESULT CALLED TO, READ BACK BY AND VERIFIED WITH: RN [email or web address].P AT 1203 ON 06/29/2021 BY T.SAAD. NO ANAEROBES ISOLATED Performed at [facility], [address] [place], [place] [address]    Report Status 07/04/2021 FINAL  Final   Organism ID, Bacteria STAPHYLOCOCCUS EPIDERMIDIS  Final   Organism ID, Bacteria STAPHYLOCOCCUS AUREUS  Final      Susceptibility   Staphylococcus aureus - MIC*    CIPROFLOXACIN <=0.5 SENSITIVE Sensitive     ERYTHROMYCIN <=0.25 SENSITIVE Sensitive     GENTAMICIN <=0.5 SENSITIVE Sensitive     OXACILLIN <=0.25 SENSITIVE Sensitive     TETRACYCLINE <=1 SENSITIVE Sensitive     VANCOMYCIN <=0.5 SENSITIVE Sensitive     TRIMETH/SULFA <=10 SENSITIVE Sensitive     CLINDAMYCIN <=0.25 SENSITIVE Sensitive     RIFAMPIN <=0.5 SENSITIVE Sensitive     Inducible Clindamycin  NEGATIVE Sensitive     * RARE STAPHYLOCOCCUS AUREUS   Staphylococcus epidermidis - MIC*    CIPROFLOXACIN <=0.5 SENSITIVE Sensitive     ERYTHROMYCIN >=8 RESISTANT Resistant     GENTAMICIN <=0.5 SENSITIVE Sensitive     OXACILLIN <=0.25 SENSITIVE Sensitive     TETRACYCLINE >=16 RESISTANT Resistant     VANCOMYCIN 2 SENSITIVE Sensitive     TRIMETH/SULFA <=10 SENSITIVE Sensitive     CLINDAMYCIN >=8 RESISTANT Resistant     RIFAMPIN <=0.5 SENSITIVE Sensitive     Inducible Clindamycin NEGATIVE Sensitive     * RARE STAPHYLOCOCCUS EPIDERMIDIS  Acid Fast Smear (AFB)     Status: None   Collection Time: 06/27/21 11:32 AM   Specimen: PATH Other; Tissue  Result Value Ref Range Status   AFB Specimen Processing Concentration  Final   Acid Fast Smear Negative  Final    Comment: (NOTE) Performed At: [facility] [address] [place], [place] [id number] Jolene Schimke MD HQ:[id number]    Source (AFB) MITRAL VALVE  Final    Comment: Performed at [facility], [address], [place], [place] [address]  Fungus Culture Result     Status: None   Collection Time: 06/27/21 11:32 AM  Result Value Ref Range Status   Result 1 Comment  Final    Comment: (NOTE) KOH/Calcofluor preparation:  no fungus observed. Performed At: [facility] [address] [place], [place] [id number] Jolene Schimke MD UY:[id number]          Radiology Studies: DG Swallowing Func-Speech Pathology  Result Date: 07/03/2021 Table formatting from the original result was not included. Images from the original result were not included. Objective Swallowing Evaluation: Type of Study: MBS-Modified Barium Swallow Study  Patient Details Name: Debra Klein MRN: [id number] Date of Birth: 1975-12-02 Today's Date: 07/03/2021 Time: SLP Start Time (ACUTE ONLY): 1000 -SLP Stop Time (ACUTE ONLY): 1030 SLP Time Calculation (min) (ACUTE ONLY): 30 min Past Medical History:  Past Medical History: Diagnosis Date  Hyperlipemia    Hypertension   IDA (iron deficiency anemia)   Schizophrenia (HCC)  Past Surgical History: Past Surgical History: Procedure Laterality Date  KNEE ARTHROSCOPY Left 06/24/2021  Procedure: ARTHROSCOPY LEFT KNEE IRRIGRATION AND DEBRIEDMENT AND SYNOVECTOMY;  Surgeon: Kathryne Hitch, MD;  Location: [facility] OR;  Service: Orthopedics;  Laterality: Left;  MITRAL VALVE REPAIR N/A 06/27/2021  Procedure: MITRAL VALVE REPAIR USING SIMUFORM SEMI-RIGID ANNULOPLASTY RING;  Surgeon: Loreli Slot, MD;  Location: [facility] OR;  Service: Open Heart Surgery;  Laterality: N/A;  No previous surgery    TEE WITHOUT CARDIOVERSION N/A 06/27/2021  Procedure: TRANSESOPHAGEAL ECHOCARDIOGRAM (TEE);  Surgeon: Loreli Slot, MD;  Location: [facility] OR;  Service: Open Heart Surgery;  Laterality: N/A; HPI: Patient is a 46 y.o. female with PMH: HTN, HLD, iron deficiency, anemia, schizophrenia who presented to the hospital on 06/17/21 to ED for evaluation of AMS. Patient initially non-verbal but awake and alert in ED. Mother reported patient as ambulatory and verbal at baseline but had not been eating or drinking for past two weeks (also not taking medications). She has had multiple falls, seen by orthopedic day before admission and questioned possible fracture left ankle and placed in Cam boot. In ED, patient admitted with severe sepsis of unknown source; MRI showed large Left MCA infarct. Patient required intubation on 6/8-6/15/23 and was intubated for surgical procedure (mitral valve repair) on 6/16, extubated 6/18. Cortrak 6/19.  Subjective: alert  Recommendations for follow up therapy are one component of a multi-disciplinary discharge planning process, led by the attending physician.  Recommendations may be updated based on patient status, additional functional criteria and insurance authorization. Assessment / Plan / Recommendation   07/03/2021  10:00 AM Clinical Impressions Clinical Impression Pt presents with a moderate oral dyspahgia  due to decreased motor planning including groping behaviors with more complex oral intake such as sipping from a straw or sealing lips to cup edge at times. Mastication somewhat slow and labored in these intial trials, though no singificant oral residue present and eventual upgrade to regualr solids anticipated. Pt is very impulsive and will drink consecutively from straw with late airway protection with thin liquids. Pt has cough without ejection when drinking thins. A chin tuck appears to help, but total assist needed for position. This may be helpful for future therapeutic efforts. When drinking nectar thick liquids there is still penetration of nectar to the vestibule with ejection. At times mild penetrate remains that mixes with secretions and sits on glottis. Pt uses a throat clear and ejects most penetrate though trace aspiration did occur x1. Recommend pt initiate a dys 2/nectar thick diet with expectation of improvement as secretion management is more automatic. WIll f/u for tolerance.   SLP Visit Diagnosis Dysphagia, oropharyngeal phase (R13.12) Impact on safety and function Mild aspiration risk     07/03/2021  10:00 AM Treatment Recommendations Treatment Recommendations Therapy as outlined in treatment plan below     07/03/2021  10:00 AM Prognosis Prognosis for Safe Diet Advancement Good   07/03/2021  10:00 AM Diet Recommendations SLP Diet Recommendations Dysphagia 2 (Fine chop) solids;Nectar thick liquid Liquid Administration via Cup;Straw Medication Administration Crushed with puree Compensations Slow rate;Small sips/bites Postural Changes Seated upright at 90 degrees     07/03/2021  10:00 AM Other Recommendations Oral Care Recommendations Oral care BID Follow Up Recommendations Skilled nursing-short term rehab (<3 hours/day) Functional Status Assessment Patient has had a recent decline in their functional status and  demonstrates the ability to make significant improvements in function in a reasonable and  predictable amount of time.   07/03/2021  10:00 AM Frequency and Duration  Speech Therapy Frequency (ACUTE ONLY) min 2x/week Treatment Duration 2 weeks     07/03/2021  10:00 AM Oral Phase Oral Phase Impaired Oral - Nectar Teaspoon WFL Oral - Nectar Cup WFL Oral - Nectar Straw Other (Comment) Oral - Thin Cup [facility] Oral - Thin Straw WFL Oral - Puree WFL Oral - Regular Decreased bolus cohesion;Delayed oral transit;Lingual/palatal residue    07/03/2021  10:00 AM Pharyngeal Phase Pharyngeal Phase Impaired Pharyngeal- Nectar Teaspoon Delayed swallow initiation-pyriform sinuses Pharyngeal- Nectar Cup Delayed swallow initiation-pyriform sinuses Pharyngeal- Nectar Straw Delayed swallow initiation-pyriform sinuses;Penetration/Apiration after swallow;Penetration/Aspiration during swallow;Penetration/Aspiration before swallow Pharyngeal Material enters airway, remains ABOVE vocal cords then ejected out;Material enters airway, remains ABOVE vocal cords and not ejected out;Material enters airway, passes BELOW cords without attempt by patient to eject out (silent aspiration);Material enters airway, passes BELOW cords then ejected out Pharyngeal- Thin Cup Penetration/Aspiration before swallow;Delayed swallow initiation-pyriform sinuses Pharyngeal Material enters airway, passes BELOW cords and not ejected out despite cough attempt by patient Pharyngeal- Thin Straw Penetration/Aspiration before swallow Pharyngeal Material enters airway, passes BELOW cords and not ejected out despite cough attempt by patient Pharyngeal- Puree Delayed swallow initiation-vallecula Pharyngeal- Mechanical Soft Delayed swallow initiation-vallecula     No data to display    DeBlois, Riley Nearing 07/03/2021, 11:39 AM                          Scheduled Meds:  amiodarone  200 mg Per Tube BID   aspirin  81 mg Per Tube Daily   benztropine  1 mg Per Tube QHS   bisacodyl  10 mg Rectal Daily   Chlorhexidine Gluconate Cloth  6 each Topical Daily    enoxaparin (LOVENOX) injection  40 mg Subcutaneous Q24H   feeding supplement (PROSource TF)  45 mL Per Tube TID   fluPHENAZine  1 mg Per Tube QHS   furosemide  40 mg Oral Daily   insulin aspart  0-24 Units Subcutaneous Q6H   metoprolol tartrate  12.5 mg Oral BID   Or   metoprolol tartrate  12.5 mg Per Tube BID   mouth rinse  15 mL Mouth Rinse 4 times per day   pantoprazole (PROTONIX) IV  40 mg Intravenous Daily   sodium chloride flush  10-40 mL Intracatheter Q12H   sodium chloride flush  10-40 mL Intracatheter Q12H   sodium chloride flush  3 mL Intravenous Q12H   vitamin B-12  1,000 mcg Per Tube Daily   Continuous Infusions:  sodium chloride Stopped (06/27/21 2102)   sodium chloride     sodium chloride Stopped (07/02/21 1046)   feeding supplement (OSMOLITE 1.5 CAL) 1,000 mL (07/02/21 2346)   lactated ringers     lactated ringers     nafcillin 12 g in sodium chloride 0.9 % 500 mL continuous infusion 12 g (07/04/21 1400)    LOS: 16 days   Time spent:  Azucena Fallen, DO [facility]  If 7PM-7AM, please contact night-coverage [email or web address]  07/05/2021, 8:11 AM

## 2021-07-06 ENCOUNTER — Other Ambulatory Visit: Payer: Self-pay

## 2021-07-06 DIAGNOSIS — A4101 Sepsis due to Methicillin susceptible Staphylococcus aureus: Secondary | ICD-10-CM | POA: Diagnosis not present

## 2021-07-06 DIAGNOSIS — I330 Acute and subacute infective endocarditis: Secondary | ICD-10-CM | POA: Diagnosis not present

## 2021-07-06 DIAGNOSIS — J9601 Acute respiratory failure with hypoxia: Secondary | ICD-10-CM | POA: Diagnosis not present

## 2021-07-06 DIAGNOSIS — G9340 Encephalopathy, unspecified: Secondary | ICD-10-CM | POA: Diagnosis not present

## 2021-07-06 LAB — GLUCOSE, CAPILLARY
Glucose-Capillary: 102 mg/dL — ABNORMAL HIGH (ref 70–99)
Glucose-Capillary: 116 mg/dL — ABNORMAL HIGH (ref 70–99)
Glucose-Capillary: 122 mg/dL — ABNORMAL HIGH (ref 70–99)
Glucose-Capillary: 126 mg/dL — ABNORMAL HIGH (ref 70–99)
Glucose-Capillary: 138 mg/dL — ABNORMAL HIGH (ref 70–99)

## 2021-07-07 ENCOUNTER — Inpatient Hospital Stay (HOSPITAL_COMMUNITY): Payer: Medicare Other

## 2021-07-07 DIAGNOSIS — A4101 Sepsis due to Methicillin susceptible Staphylococcus aureus: Secondary | ICD-10-CM | POA: Diagnosis not present

## 2021-07-07 DIAGNOSIS — I330 Acute and subacute infective endocarditis: Secondary | ICD-10-CM | POA: Diagnosis not present

## 2021-07-07 DIAGNOSIS — G9340 Encephalopathy, unspecified: Secondary | ICD-10-CM | POA: Diagnosis not present

## 2021-07-07 DIAGNOSIS — J9601 Acute respiratory failure with hypoxia: Secondary | ICD-10-CM | POA: Diagnosis not present

## 2021-07-07 LAB — GLUCOSE, CAPILLARY
Glucose-Capillary: 103 mg/dL — ABNORMAL HIGH (ref 70–99)
Glucose-Capillary: 149 mg/dL — ABNORMAL HIGH (ref 70–99)
Glucose-Capillary: 109 mg/dL — ABNORMAL HIGH (ref 70–99)

## 2021-07-07 MED ORDER — ADULT MULTIVITAMIN W/MINERALS CH
1.0000 | ORAL_TABLET | Freq: Every day | ORAL | Status: DC
  Administered 2021-07-07 – 2021-07-08 (×2): 1 via ORAL
  Filled 2021-07-07 (×2): qty 1

## 2021-07-07 MED ORDER — FUROSEMIDE 40 MG PO TABS
40.0000 mg | ORAL_TABLET | Freq: Every day | ORAL | Status: DC
Start: 2021-07-08 — End: 2021-07-09
  Administered 2021-07-08: 40 mg
  Filled 2021-07-07 (×2): qty 1

## 2021-07-07 MED ORDER — PANTOPRAZOLE SODIUM 40 MG PO TBEC: 40.0000 mg | DELAYED_RELEASE_TABLET | Freq: Every day | ORAL | Status: DC

## 2021-07-07 NOTE — TOC Progression Note (Signed)
Transition of Care [facility]) - Progression Note    Patient Details  Name: Debra Klein MRN: [id number] Date of Birth: Dec 25, 1975  Transition of Care [facility]) CM/SW Contact  Eduard Roux, [place] Phone Number: 07/07/2021, 3:33 PM  Clinical Narrative:     [facility]- they will review and follow up with CSW.   PSARR pending - will need to update clinicals once signed by attending.   Expected Discharge Plan: Skilled Nursing Facility Barriers to Discharge: SNF Pending bed offer  Expected Discharge Plan and Services Expected Discharge Plan: Skilled Nursing Facility In-house Referral: Clinical Social Work Discharge Planning Services: CM Consult Post Acute Care Choice: Skilled Nursing Facility Living arrangements for the past 2 months: Single Family Home                                       Social Determinants of Health (SDOH) Interventions    Readmission Risk Interventions     No data to display

## 2021-07-07 NOTE — Progress Notes (Signed)
Physical Therapy Treatment Patient Details Name: Debra Klein MRN: [id number] DOB: Oct 27, 1975 Today's Date: 07/07/2021   History of Present Illness Pt is a 46 y.o. female admitted 06/19/2021 with AMS, poor PO intake and recent falls, one resulting in L ankle fx. Workup for severe sepsis, septic arthritis of L knee. MRI 6/8 shows large L MCA infarct. S/p L knee arthroscopy with debridement and synovectomy 6/13. Pt intubated 6/8-6/14. Reintubated for MVR on 6/16; extubated 6/18. Cortrak placed 6/19, pt pulled it out 6/22. PMH includes schizophrenia, HTN, anemia.   PT Comments    Pt slowly progressing with mobility - noted increased interaction/verbalizations and BUE/RLE movement today, though inconsistent and not to command. Today, pt able to maintain static sitting EOB without external assist; pt required maxA+2 to totalA for standing trials in [place] frame. Pt remains limited by diffuse weakness, decreased activity tolerance, poor balance strategies/postural reactions and impaired cognition. Will continue to follow acutely to address established goals.    Recommendations for follow up therapy are one component of a multi-disciplinary discharge planning process, led by the attending physician.  Recommendations may be updated based on patient status, additional functional criteria and insurance authorization.  Follow Up Recommendations  PT at Long-term acute care hospital vs. SNF     Assistance Recommended at Discharge Frequent or constant Supervision/Assistance  Patient can return home with the following Two people to help with walking and/or transfers;Two people to help with bathing/dressing/bathroom;Assistance with cooking/housework;Assistance with feeding;Direct supervision/assist for medications management;Direct supervision/assist for financial management;Assist for transportation;Help with stairs or ramp for entrance   Equipment Recommendations  Hospital bed;Wheelchair (measurements  PT);Wheelchair cushion (measurements PT);Other (comment) (lift equipment)    Recommendations for Other Services       Precautions / Restrictions Precautions Precautions: Fall;Sternal Restrictions Weight Bearing Restrictions: Yes LLE Weight Bearing: Weight bearing as tolerated Other Position/Activity Restrictions: pt OK to not wear CAM boot per Dr. Magnus Ivan     Mobility  Bed Mobility Overal bed mobility: Needs Assistance Bed Mobility: Supine to Sit, Rolling Rolling: Max assist, +2 for physical assistance   Supine to sit: Mod assist, Max assist, HOB elevated     General bed mobility comments: Rolling R/L with maxA+2 for pericare and linen change due to bowel incontinence, pt assisting with UE support on rails. Mod-maxA for BLE management, pt initiating trunk elevation well with attempts to scoot hips towards EOB automatically, requiring assist to complete; increased R-side lateral lean with fatigue    Transfers Overall transfer level: Needs assistance   Transfers: Sit to/from Stand, Bed to chair/wheelchair/BSC Sit to Stand: Max assist, +2 physical assistance, From elevated surface           General transfer comment: MaxA+2 for standing into stedy frame, pt assisting with BUE support, required assist to place R hand on bar but then able to maintain grip without assist; increasing R lateral lean with fatigue requiring max cues to maintain upright; transfer to recliner with stedy, maxA+2 to totalA for final stand from stedy seat Transfer via Lift Equipment: Stedy  Ambulation/Gait                   Stairs             Wheelchair Mobility    Modified Rankin (Stroke Patients Only)       Balance Overall balance assessment: Needs assistance Sitting-balance support: Feet supported, Single extremity supported Sitting balance-Leahy Scale: Fair Sitting balance - Comments: able to maintain static sitting EOB without assist; increased R lateral  lean with  fatigue Postural control: Right lateral lean   Standing balance-Leahy Scale: Zero Standing balance comment: reliant on UE support and assist +2 for standing in stedy frame                            Cognition Arousal/Alertness: Awake/alert Behavior During Therapy: Flat affect Overall Cognitive Status: Difficult to assess Area of Impairment: Attention, Following commands, Safety/judgement, Awareness, Problem solving                       Following Commands: Follows one step commands inconsistently, Follows one step commands with increased time Safety/Judgement: Decreased awareness of deficits Awareness: Intellectual Problem Solving: Slow processing, Decreased initiation, Requires tactile cues, Requires verbal cues General Comments: pt with increased verbalizations this session, short answers in response to questions, though few and inconsistent. noted movement in BUEs and RLE this session, not consistent to command. 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 Comments        Pertinent Vitals/Pain Pain Assessment Pain Assessment: Faces Faces Pain Scale: Hurts little more Pain Location: "my knee" - unable to specify which knee; noted grimacing/moaning with LLE repositioning Pain Descriptors / Indicators: Grimacing, Guarding, Moaning Pain Intervention(s): Monitored during session, Repositioned    Home Living                          Prior Function            PT Goals (current goals can now be found in the care plan section) Progress towards PT goals: Progressing toward goals    Frequency    Min 3X/week      PT Plan Current plan remains appropriate    Co-evaluation PT/OT/SLP Co-Evaluation/Treatment: Yes Reason for Co-Treatment: Complexity of the patient's impairments (multi-system involvement);Necessary to address cognition/behavior during functional activity;For patient/therapist safety;To address  functional/ADL transfers PT goals addressed during session: Mobility/safety with mobility;Balance        AM-PAC PT "6 Clicks" Mobility   Outcome Measure  Help needed turning from your back to your side while in a flat bed without using bedrails?: Total Help needed moving from lying on your back to sitting on the side of a flat bed without using bedrails?: Total Help needed moving to and from a bed to a chair (including a wheelchair)?: Total Help needed standing up from a chair using your arms (e.g., wheelchair or bedside chair)?: Total Help needed to walk in hospital room?: Total Help needed climbing 3-5 steps with a railing? : Total 6 Click Score: 6    End of Session Equipment Utilized During Treatment: Gait belt Activity Tolerance: Patient tolerated treatment well Patient left: in chair;with call bell/phone within reach;with chair alarm set Nurse Communication: Mobility status;Need for lift equipment PT Visit Diagnosis: Other abnormalities of gait and mobility (R26.89);Muscle weakness (generalized) (M62.81);Hemiplegia and hemiparesis     Time: 1610-9604 PT Time Calculation (min) (ACUTE ONLY): 34 min  Charges:  $Therapeutic Activity: 8-22 mins                     Ina Homes, PT, DPT Acute Rehabilitation Services  Pager [phone] Office [phone]  Malachy Chamber 07/07/2021, 1:22 PM

## 2021-07-07 NOTE — Progress Notes (Addendum)
Occupational Therapy Treatment Patient Details Name: Debra Klein MRN: [id number] DOB: 08/20/1975 Today's Date: 07/07/2021   History of present illness Pt is a 46 y.o. female admitted 06/19/2021 with AMS, poor PO intake and recent falls, one resulting in L ankle fx. Workup for severe sepsis, septic arthritis of L knee. MRI 6/8 shows large L MCA infarct. S/p L knee arthroscopy with debridement and synovectomy 6/13. Pt intubated 6/8-6/14. Reintubated for MVR on 6/16; extubated 6/18. Cortrak placed 6/19, pt pulled it out 6/22. PMH includes schizophrenia, HTN, anemia.   OT comments  Pt progressing towards established OT goals. Pt demonstrating increased attention, engagement, sitting balance, and functional use of UEs this session. Pt washing her face while sitting at EOB with Min A and Max cues. Pt requiring Max A +2 for sit<>stand with stedy for transfer to recliner. Continue to recommend dc to [facility] for intensive OT and will continue to follow acutely as admitted.    Recommendations for follow up therapy are one component of a multi-disciplinary discharge planning process, led by the attending physician.  Recommendations may be updated based on patient status, additional functional criteria and insurance authorization.    Follow Up Recommendations  Acute inpatient rehab (3hours/day)    Assistance Recommended at Discharge Frequent or constant Supervision/Assistance  Patient can return home with the following  Two people to help with walking and/or transfers;Two people to help with bathing/dressing/bathroom   Equipment Recommendations  Other (comment) (TBD)    Recommendations for Other Services Rehab consult    Precautions / Restrictions Precautions Precautions: Fall;Sternal Required Braces or Orthoses: Splint/Cast Splint/Cast: L cam boot Splint/Cast - Date Prophylactic Dressing Applied (if applicable): 06/20/21 Restrictions Weight Bearing Restrictions: Yes LLE Weight Bearing: Weight  bearing as tolerated Other Position/Activity Restrictions: pt OK to not wear CAM boot per Dr. Magnus Ivan       Mobility Bed Mobility Overal bed mobility: Needs Assistance Bed Mobility: Rolling, Sidelying to Sit Rolling: Max assist, +2 for physical assistance   Supine to sit: Mod assist, Max assist, HOB elevated Sit to supine: Total assist   General bed mobility comments: Rolling R/L with maxA+2 for pericare and linen change due to bowel incontinence, pt assisting with UE support on rails. Mod-maxA for BLE management, pt initiating trunk elevation well with attempts to scoot hips towards EOB automatically, requiring assist to complete; increased R-side lateral lean with fatigue    Transfers Overall transfer level: Needs assistance   Transfers: Sit to/from Stand, Bed to chair/wheelchair/BSC Sit to Stand: Max assist, +2 physical assistance, From elevated surface           General transfer comment: MaxA+2 for standing into stedy frame, pt assisting with BUE support, required assist to place R hand on bar but then able to maintain grip without assist; increasing R lateral lean with fatigue requiring max cues to maintain upright; transfer to recliner with stedy, maxA+2 to totalA for final stand from stedy seat Transfer via Lift Equipment: Stedy   Balance Overall balance assessment: Needs assistance Sitting-balance support: Feet supported, Single extremity supported Sitting balance-Leahy Scale: Fair Sitting balance - Comments: able to maintain static sitting EOB without assist; increased R lateral lean with fatigue Postural control: Right lateral lean   Standing balance-Leahy Scale: Zero Standing balance comment: reliant on UE support and assist +2 for standing in stedy frame                           ADL either performed or  assessed with clinical judgement   ADL Overall ADL's : Needs assistance/impaired     Grooming: Minimal assistance;Wash/dry face;Sitting;Cueing for  sequencing (Max cues) Grooming Details (indicate cue type and reason): Min A for placing cloth at hand and supporting LUE. Cueing for pt to wash her eyes, and pt bringing wash clothe to her mouth. cueing again for washing eyes and pt bringing cloth to her L eye. Requiring hand over hand to initating bringing to R eye.           Upper Body Dressing Details (indicate cue type and reason): Left sleeve falling off during bed mobility. Max cues for visually attending to sleeve. Pt then able to bring LUE up and intp sleeve with verbal cues     Toilet Transfer: Maximal assistance;+2 for physical assistance (sit<>stand with stedy)           Functional mobility during ADLs: Maximal assistance;+2 for physical assistance (sit<>stand) General ADL Comments: Demonstrating increased engagement, balance, and functional performance. Continues to present with poor cognition, balance, strength, and safety. Able to participate in grooming at EOB and then sit<>stand with stedy for transfer to recliner    Extremity/Trunk Assessment Upper Extremity Assessment Upper Extremity Assessment: RUE deficits/detail;LUE deficits/detail;Difficult to assess due to impaired cognition RUE Deficits / Details: Reaching R hand up to grasp therapist's phone. Decreased intentional movement. Able to trasnfer stedy with R hand when not attending to it RUE Coordination: decreased fine motor;decreased gross motor LUE Deficits / Details: Moving more sparatically. WFL for strength. Able to reach overhead LUE Coordination: decreased gross motor;decreased fine motor   Lower Extremity Assessment Lower Extremity Assessment: Defer to PT evaluation RLE Deficits / Details: limited due to poor command following, pt able to complete single command to "wiggle your toes" x1after getting pt attention and placing RLE into pt's visual field. LLE Deficits / Details: limited assessment due to impaired cognition, pt responding to any movement of LLE,  likel due to pain. pt did not follow any commands regardin movement of LLE LLE: Unable to fully assess due to pain        Vision   Additional Comments: Able to attend to therapist on left and right sides.   Perception     Praxis      Cognition Arousal/Alertness: Awake/alert Behavior During Therapy: Flat affect Overall Cognitive Status: Difficult to assess Area of Impairment: Attention, Following commands, Safety/judgement, Awareness, Problem solving, Memory                   Current Attention Level: Sustained Memory: Decreased short-term memory Following Commands: Follows one step commands inconsistently, Follows one step commands with increased time Safety/Judgement: Decreased awareness of deficits Awareness: Intellectual Problem Solving: Slow processing, Decreased initiation, Requires tactile cues, Requires verbal cues General Comments: pt with increased verbalizations this session, short answers in response to questions, though few and inconsistent. noted movement in BUEs and RLE this session, not consistent to command. 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 Instructions       General Comments VSS    Pertinent Vitals/ Pain       Pain Assessment Pain Assessment: Faces Faces Pain Scale: Hurts little more Pain Location: "my knee" - unable to specify which knee; noted grimacing/moaning with LLE repositioning Pain Descriptors / Indicators: Grimacing, Guarding, Moaning Pain Intervention(s): Monitored during session, Limited activity within patient's tolerance, Repositioned  Home Living  Prior Functioning/Environment              Frequency  Min 2X/week        Progress Toward Goals  OT Goals(current goals can now be found in the care plan section)  Progress towards OT goals: Progressing toward goals  Acute Rehab OT Goals OT Goal Formulation: Patient  unable to participate in goal setting Time For Goal Achievement: 07/11/21 Potential to Achieve Goals: Good ADL Goals Pt Will Perform Grooming: with mod assist;sitting Pt Will Transfer to Toilet: with +2 assist;with mod assist;bedside commode;stand pivot transfer Additional ADL Goal #1: pt will follow 2 step command 50% of attempts during session Additional ADL Goal #2: pt will complete bed mobility total +2 Min (A) as precursor to adls.  Plan Discharge plan remains appropriate    Co-evaluation    PT/OT/SLP Co-Evaluation/Treatment: Yes Reason for Co-Treatment: For patient/therapist safety;To address functional/ADL transfers PT goals addressed during session: Mobility/safety with mobility;Balance OT goals addressed during session: ADL's and self-care      AM-PAC OT "6 Clicks" Daily Activity     Outcome Measure   Help from another person eating meals?: Total Help from another person taking care of personal grooming?: A Lot Help from another person toileting, which includes using toliet, bedpan, or urinal?: A Lot Help from another person bathing (including washing, rinsing, drying)?: Total Help from another person to put on and taking off regular upper body clothing?: Total Help from another person to put on and taking off regular lower body clothing?: Total 6 Click Score: 8    End of Session Equipment Utilized During Treatment: Gait belt  OT Visit Diagnosis: Unsteadiness on feet (R26.81);Muscle weakness (generalized) (M62.81)   Activity Tolerance Patient tolerated treatment well   Patient Left with call bell/phone within reach;in chair;with chair alarm set   Nurse Communication Mobility status;Precautions        Time: 1610-9604 OT Time Calculation (min): 38 min  Charges: OT General Charges $OT Visit: 1 Visit OT Treatments $Self Care/Home Management : 23-37 mins  Marietta Sikkema MSOT, OTR/L Acute Rehab Office: [phone]  Theodoro Grist Donnamarie Shankles 07/07/2021, 2:24  PM

## 2021-07-08 ENCOUNTER — Encounter (HOSPITAL_COMMUNITY): Payer: Self-pay | Admitting: Thoracic Surgery (Cardiothoracic Vascular Surgery)

## 2021-07-08 DIAGNOSIS — G9340 Encephalopathy, unspecified: Secondary | ICD-10-CM | POA: Diagnosis not present

## 2021-07-08 DIAGNOSIS — A4101 Sepsis due to Methicillin susceptible Staphylococcus aureus: Secondary | ICD-10-CM | POA: Diagnosis not present

## 2021-07-08 DIAGNOSIS — I330 Acute and subacute infective endocarditis: Secondary | ICD-10-CM | POA: Diagnosis not present

## 2021-07-08 DIAGNOSIS — J9601 Acute respiratory failure with hypoxia: Secondary | ICD-10-CM | POA: Diagnosis not present

## 2021-07-08 LAB — BASIC METABOLIC PANEL
Anion gap: 11 (ref 5–15)
BUN: 6 mg/dL (ref 6–20)
CO2: 26 mmol/L (ref 22–32)
Calcium: 8.4 mg/dL — ABNORMAL LOW (ref 8.9–10.3)
Chloride: 101 mmol/L (ref 98–111)
Creatinine, Ser: 0.76 mg/dL (ref 0.44–1.00)
GFR, Estimated: >60 mL/min (ref >60)
Glucose, Bld: 104 mg/dL — ABNORMAL HIGH (ref 70–99)
Potassium: 2.4 mmol/L — CL (ref 3.5–5.1)
Sodium: 138 mmol/L (ref 135–145)

## 2021-07-08 LAB — CBC
HCT: 29.6 % — ABNORMAL LOW (ref 36.0–46.0)
Hemoglobin: 9.5 g/dL — ABNORMAL LOW (ref 12.0–15.0)
MCH: 27.9 pg (ref 26.0–34.0)
MCHC: 32.1 g/dL (ref 30.0–36.0)
MCV: 86.8 fL (ref 80.0–100.0)
Platelets: 462 10*3/uL — ABNORMAL HIGH (ref 150–400)
RBC: 3.41 MIL/uL — ABNORMAL LOW (ref 3.87–5.11)
RDW: 20.1 % — ABNORMAL HIGH (ref 11.5–15.5)
WBC: 12.4 10*3/uL — ABNORMAL HIGH (ref 4.0–10.5)
nRBC: 0 % (ref 0.0–0.2)

## 2021-07-08 LAB — GLUCOSE, CAPILLARY
Glucose-Capillary: 102 mg/dL — ABNORMAL HIGH (ref 70–99)
Glucose-Capillary: 19 mg/dL — CL (ref 70–99)
Glucose-Capillary: 74 mg/dL (ref 70–99)
Glucose-Capillary: 86 mg/dL (ref 70–99)
Glucose-Capillary: 96 mg/dL (ref 70–99)
Glucose-Capillary: 97 mg/dL (ref 70–99)

## 2021-07-08 LAB — MAGNESIUM: Magnesium: 1.7 mg/dL (ref 1.7–2.4)

## 2021-07-08 LAB — PHOSPHORUS: Phosphorus: 3.3 mg/dL (ref 2.5–4.6)

## 2021-07-08 MED ORDER — POTASSIUM CHLORIDE 10 MEQ/100ML IV SOLN
10.0000 meq | INTRAVENOUS | Status: AC
  Administered 2021-07-08 (×6): 10 meq via INTRAVENOUS
  Filled 2021-07-08 (×6): qty 100

## 2021-07-08 MED ORDER — CLOZAPINE 25 MG PO TABS
50.0000 mg | ORAL_TABLET | Freq: Every day | ORAL | Status: DC
  Administered 2021-07-08: 50 mg
  Filled 2021-07-08: qty 2

## 2021-07-08 MED ORDER — POTASSIUM CHLORIDE 20 MEQ PO PACK: 40.0000 meq | PACK | Freq: Every day | ORAL | Status: DC

## 2021-07-08 MED ORDER — POTASSIUM CHLORIDE 20 MEQ PO PACK
40.0000 meq | PACK | Freq: Every day | ORAL | Status: DC
  Administered 2021-07-08: 40 meq
  Filled 2021-07-08 (×2): qty 2

## 2021-07-08 MED ORDER — ADULT MULTIVITAMIN W/MINERALS CH
1.0000 | ORAL_TABLET | Freq: Every day | ORAL | Status: DC
  Filled 2021-07-08: qty 1

## 2021-07-08 MED ORDER — PANTOPRAZOLE 2 MG/ML SUSPENSION
40.0000 mg | Freq: Every day | ORAL | Status: DC
  Filled 2021-07-08 (×2): qty 20

## 2021-07-08 NOTE — TOC Progression Note (Signed)
Transition of Care (TOC) - Progression Note  Donn Pierini RN, BSN Transitions of Care Unit 4E- RN Case Manager See Treatment Team for direct phone #    Patient Details  Name: Debra Klein MRN: [id number] Date of Birth: 1975/05/03  Transition of Care [facility]) CM/SW Contact  Zenda Alpers, Lenn Sink, RN Phone Number: 07/08/2021, 4:27 PM  Clinical Narrative:    CSW and CM [facility] mom [facility]). CM also provided option for [facility] in [place]- if they would like for them to review clinics to see if pt may meet their criteria for admission. Mom and sister agreeable to have clinicals sent to [facility] for review- encouraged them to tour both [facility] and [facility] to compare, explained the differences in levels of care between the two facilities.   Clinicals sent via HUB to [facility], and call made to liaison Victorino Dike to request review. TOC to f/u tomorrow. Mom and sister understand that [facility] would also be based on bed offer/bed availability as well as Therapist, occupational.      Expected Discharge Plan: Skilled Nursing Facility Barriers to Discharge: SNF Pending bed offer  Expected Discharge Plan and Services Expected Discharge Plan: Skilled Nursing Facility In-house Referral: Clinical Social Work Discharge Planning Services: CM Consult Post Acute Care Choice: Skilled Nursing Facility Living arrangements for the past 2 months: Single Family Home                                       Social Determinants of Health (SDOH) Interventions    Readmission Risk Interventions     No data to display

## 2021-07-08 NOTE — Progress Notes (Signed)
Mobility Specialist: Progress Note   07/08/21 1444  Mobility  Activity Transferred from chair to bed  Level of Assistance +2 (takes two people)  Assistive Device MaxiMove  LLE Weight Bearing WBAT  Activity Response Tolerated fair  $Mobility charge 1 Mobility   Pt assisted back to bed via maximove with help from NT. Pt fatigued this session from sitting up requiring frequent verbal cues for hand placement as well as physical assistance. Pt back in bed with call bell at her side and family present in the room.   [facility] Maile Linford Mobility Specialist Mobility Specialist [address]: [phone]

## 2021-07-09 DIAGNOSIS — A4101 Sepsis due to Methicillin susceptible Staphylococcus aureus: Secondary | ICD-10-CM | POA: Diagnosis not present

## 2021-07-09 DIAGNOSIS — G9341 Metabolic encephalopathy: Secondary | ICD-10-CM | POA: Diagnosis not present

## 2021-07-09 DIAGNOSIS — R6520 Severe sepsis without septic shock: Secondary | ICD-10-CM | POA: Diagnosis not present

## 2021-07-09 LAB — CBC WITH DIFFERENTIAL/PLATELET
Abs Immature Granulocytes: 0.07 10*3/uL (ref 0.00–0.07)
Basophils Absolute: 0 10*3/uL (ref 0.0–0.1)
Basophils Relative: 0 %
Eosinophils Absolute: 0 10*3/uL (ref 0.0–0.5)
Eosinophils Relative: 0 %
HCT: 28.3 % — ABNORMAL LOW (ref 36.0–46.0)
Hemoglobin: 9.1 g/dL — ABNORMAL LOW (ref 12.0–15.0)
Immature Granulocytes: 1 %
Lymphocytes Relative: 8 %
Lymphs Abs: 1.1 10*3/uL (ref 0.7–4.0)
MCH: 28.1 pg (ref 26.0–34.0)
MCHC: 32.2 g/dL (ref 30.0–36.0)
MCV: 87.3 fL (ref 80.0–100.0)
Monocytes Absolute: 1.1 10*3/uL — ABNORMAL HIGH (ref 0.1–1.0)
Monocytes Relative: 8 %
Neutro Abs: 11.1 10*3/uL — ABNORMAL HIGH (ref 1.7–7.7)
Neutrophils Relative %: 83 %
Platelets: 510 10*3/uL — ABNORMAL HIGH (ref 150–400)
RBC: 3.24 MIL/uL — ABNORMAL LOW (ref 3.87–5.11)
RDW: 20.7 % — ABNORMAL HIGH (ref 11.5–15.5)
WBC: 13.4 10*3/uL — ABNORMAL HIGH (ref 4.0–10.5)
nRBC: 0 % (ref 0.0–0.2)

## 2021-07-09 LAB — CBC
HCT: 26 % — ABNORMAL LOW (ref 36.0–46.0)
Hemoglobin: 8.6 g/dL — ABNORMAL LOW (ref 12.0–15.0)
MCH: 29.1 pg (ref 26.0–34.0)
MCHC: 33.1 g/dL (ref 30.0–36.0)
MCV: 87.8 fL (ref 80.0–100.0)
Platelets: 469 10*3/uL — ABNORMAL HIGH (ref 150–400)
RBC: 2.96 MIL/uL — ABNORMAL LOW (ref 3.87–5.11)
RDW: 20.5 % — ABNORMAL HIGH (ref 11.5–15.5)
WBC: 10.5 10*3/uL (ref 4.0–10.5)
nRBC: 0 % (ref 0.0–0.2)

## 2021-07-09 LAB — BASIC METABOLIC PANEL
Anion gap: 7 (ref 5–15)
BUN: 7 mg/dL (ref 6–20)
CO2: 26 mmol/L (ref 22–32)
Calcium: 8.2 mg/dL — ABNORMAL LOW (ref 8.9–10.3)
Chloride: 102 mmol/L (ref 98–111)
Creatinine, Ser: 0.75 mg/dL (ref 0.44–1.00)
GFR, Estimated: >60 mL/min (ref >60)
Glucose, Bld: 94 mg/dL (ref 70–99)
Potassium: 3.1 mmol/L — ABNORMAL LOW (ref 3.5–5.1)
Sodium: 135 mmol/L (ref 135–145)

## 2021-07-09 LAB — GLUCOSE, CAPILLARY
Glucose-Capillary: 107 mg/dL — ABNORMAL HIGH (ref 70–99)
Glucose-Capillary: 117 mg/dL — ABNORMAL HIGH (ref 70–99)
Glucose-Capillary: 121 mg/dL — ABNORMAL HIGH (ref 70–99)
Glucose-Capillary: 82 mg/dL (ref 70–99)
Glucose-Capillary: 90 mg/dL (ref 70–99)
Glucose-Capillary: 92 mg/dL (ref 70–99)
Glucose-Capillary: 93 mg/dL (ref 70–99)

## 2021-07-09 MED ORDER — FLUPHENAZINE HCL 1 MG PO TABS
1.0000 mg | ORAL_TABLET | Freq: Every day | ORAL | Status: DC
  Administered 2021-07-09 – 2021-07-16 (×8): 1 mg via ORAL
  Filled 2021-07-09 (×9): qty 1

## 2021-07-09 MED ORDER — VITAMIN B-12 1000 MCG PO TABS
1000.0000 ug | ORAL_TABLET | Freq: Every day | ORAL | Status: DC
Start: 2021-07-09 — End: 2021-07-18
  Administered 2021-07-09 – 2021-07-17 (×9): 1000 ug via ORAL
  Filled 2021-07-09 (×8): qty 1

## 2021-07-09 MED ORDER — ASPIRIN 81 MG PO CHEW
81.0000 mg | CHEWABLE_TABLET | Freq: Every day | ORAL | Status: DC
  Administered 2021-07-09 – 2021-07-17 (×9): 81 mg via ORAL
  Filled 2021-07-09 (×8): qty 1

## 2021-07-09 MED ORDER — POTASSIUM CHLORIDE 20 MEQ PO PACK
20.0000 meq | PACK | Freq: Once | ORAL | Status: AC
  Administered 2021-07-09: 20 meq via ORAL
  Filled 2021-07-09: qty 1

## 2021-07-09 MED ORDER — PANTOPRAZOLE 2 MG/ML SUSPENSION
40.0000 mg | Freq: Every day | ORAL | Status: DC
Start: 2021-07-09 — End: 2021-07-10
  Administered 2021-07-09: 40 mg via ORAL
  Filled 2021-07-09 (×2): qty 20

## 2021-07-09 MED ORDER — CLOZAPINE 25 MG PO TABS
50.0000 mg | ORAL_TABLET | Freq: Every day | ORAL | Status: DC
  Administered 2021-07-09 – 2021-07-16 (×8): 50 mg via ORAL
  Filled 2021-07-09 (×9): qty 2

## 2021-07-09 MED ORDER — FUROSEMIDE 40 MG PO TABS
40.0000 mg | ORAL_TABLET | Freq: Every day | ORAL | Status: DC
  Administered 2021-07-09 – 2021-07-10 (×2): 40 mg via ORAL
  Filled 2021-07-09: qty 1

## 2021-07-09 MED ORDER — AMIODARONE HCL 200 MG PO TABS
200.0000 mg | ORAL_TABLET | Freq: Two times a day (BID) | ORAL | Status: DC
  Administered 2021-07-09 – 2021-07-11 (×5): 200 mg via ORAL
  Filled 2021-07-09 (×4): qty 1

## 2021-07-09 MED ORDER — BENZTROPINE MESYLATE 1 MG PO TABS
1.0000 mg | ORAL_TABLET | Freq: Every day | ORAL | Status: DC
  Administered 2021-07-09 – 2021-07-16 (×8): 1 mg via ORAL
  Filled 2021-07-09 (×8): qty 1

## 2021-07-09 MED ORDER — POTASSIUM CHLORIDE 20 MEQ PO PACK
40.0000 meq | PACK | Freq: Every day | ORAL | Status: DC
  Administered 2021-07-09: 40 meq via ORAL

## 2021-07-09 MED ORDER — ADULT MULTIVITAMIN W/MINERALS CH
1.0000 | ORAL_TABLET | Freq: Every day | ORAL | Status: DC
  Administered 2021-07-09 – 2021-07-17 (×9): 1 via ORAL
  Filled 2021-07-09 (×7): qty 1

## 2021-07-09 NOTE — Progress Notes (Signed)
Physical Therapy Treatment Patient Details Name: Debra Klein MRN: [id number] DOB: Jun 20, 1975 Today's Date: 07/09/2021   History of Present Illness Pt is a 46 y.o. female admitted 06/19/2021 with AMS, poor PO intake and recent falls, one resulting in L ankle fx. Workup for severe sepsis, septic arthritis of L knee. MRI 6/8 shows large L MCA infarct. S/p L knee arthroscopy with debridement and synovectomy 6/13. Pt intubated 6/8-6/14. Reintubated for MVR on 6/16; extubated 6/18. Cortrak placed 6/19, pt pulled it out 6/22. PMH includes schizophrenia, HTN, anemia.    PT Comments    Pt with continued progress this session with OOB mobility. Pt able to come to sitting EOB with up to mod assist with pt initiating movement with LUE and demonstrating fair core strength to elevate trunk from supine with light assist. Pt able to come to standing from EOB to stedy frame with mod assist +2 and repeat x4 from stedy seat. Pt able to initiate intentional movement with RUE with increased time in sitting for facilitation of anterior weight shift. Pt continues to benefit from skilled PT services to progress toward functional mobility goals.    Recommendations for follow up therapy are one component of a multi-disciplinary discharge planning process, led by the attending physician.  Recommendations may be updated based on patient status, additional functional criteria and insurance authorization.  Follow Up Recommendations  PT at Long-term acute care hospital     Assistance Recommended at Discharge Frequent or constant Supervision/Assistance  Patient can return home with the following Two people to help with walking and/or transfers;Two people to help with bathing/dressing/bathroom;Assistance with cooking/housework;Assistance with feeding;Direct supervision/assist for medications management;Direct supervision/assist for financial management;Assist for transportation;Help with stairs or ramp for entrance   Equipment  Recommendations  Hospital bed;Wheelchair (measurements PT);Wheelchair cushion (measurements PT);Other (comment)    Recommendations for Other Services Rehab consult     Precautions / Restrictions Precautions Precautions: Fall;Sternal Restrictions Weight Bearing Restrictions: Yes LLE Weight Bearing: Weight bearing as tolerated Other Position/Activity Restrictions: pt OK to not wear CAM boot per Dr. Ninfa Linden     Mobility  Bed Mobility Overal bed mobility: Needs Assistance       Supine to sit: Mod assist, Max assist, HOB elevated     General bed mobility comments: pt able to assist with UE support on rails. Mod-maxA for BLE management, pt initiating trunk elevation well with attempts to scoot hips towards EOB automatically, requiring assist to complete;    Transfers Overall transfer level: Needs assistance Equipment used: Ambulation equipment used Transfers: Sit to/from Stand, Bed to chair/wheelchair/BSC Sit to Stand: Mod assist, Max assist, +2 physical assistance, From elevated surface           General transfer comment: Mod-max A+2 for standing into stedy frame, pt assisting with BUE support, required assist to place R hand on bar but then able to maintain grip without assist; good initiation into standing but sinking down and unable to maintain requiring max cues for upright; transfer to recliner with stedy Transfer via Lift Equipment: Stedy  Ambulation/Gait               General Gait Details: unable   Stairs             Wheelchair Mobility    Modified Rankin (Stroke Patients Only)       Balance Overall balance assessment: Needs assistance Sitting-balance support: Feet supported, Single extremity supported Sitting balance-Leahy Scale: Fair Sitting balance - Comments: able to maintain static sitting EOB without assist; increased  R lateral lean with fatigue Postural control: Right lateral lean   Standing balance-Leahy Scale: Zero Standing balance  comment: reliant on UE support and assist +2 for standing in stedy frame                            Cognition Arousal/Alertness: Awake/alert Behavior During Therapy: Flat affect Overall Cognitive Status: Difficult to assess Area of Impairment: Attention, Following commands, Safety/judgement, Awareness, Problem solving, Memory                     Memory: Decreased short-term memory Following Commands: Follows one step commands inconsistently, Follows one step commands with increased time Safety/Judgement: Decreased awareness of deficits   Problem Solving: Slow processing, Decreased initiation, Requires tactile cues, Requires verbal cues General Comments: pt with increased verbalizations this session, short answers in response to questions, though few and inconsistent. noted movement in BUEs and RLE this session, not consistent to command.        Exercises Other Exercises Other Exercises: reaching/punching pillow in all planes with LUE x10 with pt able to complete with increased time and cue for each rep, pt able to lift RUE in attempt to reach pillow x5 Other Exercises: pushing tray on wheels out in front of self x5 to facilitate anterior weight shift    General Comments General comments (skin integrity, edema, etc.): VSS on RA      Pertinent Vitals/Pain Pain Assessment Pain Assessment: Faces Faces Pain Scale: Hurts little more Pain Location: "my knee" - unable to specify which knee; noted grimacing/moaning with LLE repositioning Pain Descriptors / Indicators: Grimacing, Guarding, Moaning Pain Intervention(s): Monitored during session, Limited activity within patient's tolerance, Repositioned    Home Living                          Prior Function            PT Goals (current goals can now be found in the care plan section) Acute Rehab PT Goals PT Goal Formulation: With family Time For Goal Achievement: 07/17/21    Frequency    Min  3X/week      PT Plan Current plan remains appropriate    Co-evaluation PT/OT/SLP Co-Evaluation/Treatment: Yes            AM-PAC PT "6 Clicks" Mobility   Outcome Measure  Help needed turning from your back to your side while in a flat bed without using bedrails?: Total Help needed moving from lying on your back to sitting on the side of a flat bed without using bedrails?: Total Help needed moving to and from a bed to a chair (including a wheelchair)?: Total Help needed standing up from a chair using your arms (e.g., wheelchair or bedside chair)?: Total Help needed to walk in hospital room?: Total Help needed climbing 3-5 steps with a railing? : Total 6 Click Score: 6    End of Session Equipment Utilized During Treatment: Gait belt Activity Tolerance: Patient tolerated treatment well Patient left: in chair;with call bell/phone within reach;with chair alarm set Nurse Communication: Mobility status;Need for lift equipment PT Visit Diagnosis: Other abnormalities of gait and mobility (R26.89);Muscle weakness (generalized) (M62.81);Hemiplegia and hemiparesis Hemiplegia - Right/Left: Right Hemiplegia - dominant/non-dominant: Dominant Hemiplegia - caused by: Cerebral infarction     Time: 1020-1047 PT Time Calculation (min) (ACUTE ONLY): 27 min  Charges:  $Therapeutic Activity: 23-37 mins  Audry Riles. PTA Acute Rehabilitation Services Office: [place] 07/09/2021, 12:08 PM

## 2021-07-09 NOTE — Progress Notes (Signed)
PROGRESS NOTE  Debra Klein  FBP:[id number] DOB: 1975-06-22 DOA: 06/19/2021 PCP: Gaynelle Arabian, MD   Brief Narrative: Patient is a 46 year old female with history of schizophrenia , hypertension, hyperlipidemia who initially presented with acute encephalopathy.  No report of eating or drinking for 2 weeks, not taking medications, having multiple falls and recently sustained fracture on the left ankle, placed on cam boot.  She was found on the floor.  On presentation she was found to have fever, leukocytosis, AKI, elevated LFT, hyperkalemia, lactic acidosis.  Patient was started on IV fluids, empiric antibiotics and was admitted.  Her encephalopathy continued to deteriorate and she was finally intubated and was transferred to [facility] service.  CT head showed insular hypodensity concerning for MCA infarct.  Hospital course also remarkable for finding of bacteremia,mitral valve endocarditis, status post mitral valve repair.  ID was following.  Prolonged hospitalization.  Patient transferred to [facility] service on 6/22.  Currently hemodynamically stable.  PT/OT recommending CIR on discharge ,TOC following.  Medically stable for dc when possible.  Important events  6/7: Fall --> fibula fracture, Minimally verbal at an outpatient visit with [facility]  6/8: Presented to the ED nonverbal with altered mentation. Progressively worsening mentation with requirement of intubation for airway protection.  Empirically treated for sepsis, presumed meningitis. MRI with large MCA infarct and punctate right hemisphere infarcts.  6/9: Blood cultures obtained on arrival positive for MSSA  6/10 MRI knee demonstrates septic arthritis.  6/13 L knee arthroscopy with I&D with snyovectomy for septic arthritis, TEE 6/14 steroids for no cuff leak 6/15 extubated, much more alert 6/16 mitral valve repair. Left chest tube placed for pleural effusion  6/17 too weak to come off vent. Not following commands.  6/18 extubated 6/19 required  BiPAP due to decreased responsiveness overnight after getting antipsychotic meds    Assessment & Plan:  Principal Problem:   Sepsis ([place]) Active Problems:   Schizophrenia ([place])   Transaminitis   AKI (acute kidney injury) ([place])   Bacteremia due to methicillin susceptible Staphylococcus aureus (MSSA)   Toxic encephalopathy   Cerebrovascular accident (CVA) due to embolic occlusion of left middle cerebral artery (HCC)   Acute respiratory failure with hypoxia (HCC)   Septic arthritis of knee, left (HCC)   S/P mitral valve repair   Pressure injury of skin   Acute bacterial endocarditis  Sepsis/MSSA bacteremia/Staph epidermidis mitral valve endocarditis: ID was following.  Continue current antibiotics.  Still has leukocytosis but improving.  Afebrile.  Requires long-term antibiotic therapy.  Currently on nafcillin, plan to continue through 7/28. Has a picc line on right upper extremity  Staph epidermidis mitral valve endocarditis: Cardiothoracic surgery following, status post mitral valve repair.  Acute metabolic encephalopathy: Secondary to sepsis, stroke.  Mental status has improved but has global aphasia now from a stroke.  Follows commands  left MCA stroke due to septic emboli: Neurology was following.  Continue to monitor mental status.  Has right-sided weakness, aphasic, follows commands intermittently,On aspirin  Brief episode of A-fib: Cardiothoracic surgery started on amiodarone.  Also on metoprolol.  Currently in normal sinus rhythm.  Acute respiratory failure: Secondary to left-sided atelectasis, left-sided pleural effusion.  Currently on room air.  Encourage incentive spirometry ,flutter valve  Dysphagia: Secondary to stroke.  She was on core track.  Speech therapy following, recommending dysphagia 1 diet, core track taken out  Cardiogenic shock: Suspected to be from mitral valve regurgitation.  She was on IV diuresis.  She was on inotropes, vasopressors, now discontinued.  Currently blood pressure stable.  IV Lasix will be discontinued, started on Lasix 40 mg daily.  Still looks mildly volume overloaded  Severe hypokalemia: S Continue to monitor and supplement  Left knee septic arthritis: MRI of the left knee on 6/10 showed septic arthritis, peritubular osteomyelitis with subcu abscess.  Status post diagnostic arthrocentesis on 6/12.  Cultures showed MSSA.  Status post left knee arthroscopy, I&D, synovectomy.  Continue current antibiotics.  She needs outpatient follow-up with orthopedics  Normocytic anemia: Currently hemoglobin stable.  Diarrhea: Improved,rectal tube out  Prediabetes: Hemoglobin A1c of 6.1.  Currently on sliding scale insulin  History of schizophrenia: Continue fluphenazine, clozapine  Disposition: PT/OT recommended acute inpatient rehab.  TOC following     Nutrition Problem: Increased nutrient needs Etiology:  (endocarditis) Pressure Injury 06/27/21 Buttocks Posterior;Medial Stage 2 -  Partial thickness loss of dermis presenting as a shallow open injury with a red, pink wound bed without slough. (Active)  06/27/21 1900  Location: Buttocks  Location Orientation: Posterior;Medial  Staging: Stage 2 -  Partial thickness loss of dermis presenting as a shallow open injury with a red, pink wound bed without slough.  Wound Description (Comments):   Present on Admission:   Dressing Type Foam - Lift dressing to assess site every shift 07/09/21 0744    DVT prophylaxis:enoxaparin (LOVENOX) injection 40 mg Start: 07/02/21 2000 SCDs Start: 06/27/21 1518     Code Status: Full Code  Family Communication:: Discussed with mother on phone on 6/23  Patient status: Inpatient  Patient is from : Home  Anticipated discharge to: Inpatient rehab vs SnF  Estimated DC date: Not sure   Consultants: PCCM, cardiology, cardiothoracic surgery, neurology, ID  Procedures: As above  Antimicrobials:  Anti-infectives (From admission, onward)    Start      Dose/Rate Route Frequency Ordered Stop   06/30/21 1600  vancomycin (VANCOREADY) IVPB 1750 mg/350 mL  Status:  Discontinued        1,750 mg 175 mL/hr over 120 Minutes Intravenous Every 24 hours 06/29/21 1429 06/30/21 0746   06/30/21 1600  vancomycin (VANCOREADY) IVPB 1250 mg/250 mL  Status:  Discontinued        1,250 mg 166.7 mL/hr over 90 Minutes Intravenous Every 24 hours 06/30/21 0746 06/30/21 1351   06/30/21 1500  nafcillin 12 g in sodium chloride 0.9 % 500 mL continuous infusion        12 g 20.8 mL/hr over 24 Hours Intravenous Every 24 hours 06/30/21 1351     06/29/21 1515  vancomycin (VANCOREADY) IVPB 2000 mg/400 mL        2,000 mg 200 mL/hr over 120 Minutes Intravenous  Once 06/29/21 1416 06/29/21 1700   06/27/21 2215  vancomycin (VANCOCIN) IVPB 1000 mg/200 mL premix        1,000 mg 200 mL/hr over 60 Minutes Intravenous  Once 06/27/21 1517 06/27/21 2359   06/27/21 1615  ceFAZolin (ANCEF) IVPB 2g/100 mL premix        2 g 200 mL/hr over 30 Minutes Intravenous Every 8 hours 06/27/21 1517 06/29/21 0538   06/27/21 0400  vancomycin (VANCOCIN) 1,500 mg in sodium chloride 0.9 % 250 mL IVPB        1,500 mg 132.5 mL/hr over 120 Minutes Intravenous To Surgery 06/26/21 1345 06/27/21 1135   06/27/21 0400  ceFAZolin (ANCEF) IVPB 2g/100 mL premix        2 g 200 mL/hr over 30 Minutes Intravenous To Surgery 06/26/21 1345 06/27/21 0945   06/27/21 0400  ceFAZolin (  ANCEF) IVPB 2g/100 mL premix        2 g 200 mL/hr over 30 Minutes Intravenous To Surgery 06/26/21 1345 06/27/21 1419   06/20/21 2200  vancomycin (VANCOREADY) IVPB 1250 mg/250 mL  Status:  Discontinued        1,250 mg 166.7 mL/hr over 90 Minutes Intravenous Every 24 hours 06/19/21 2203 06/19/21 2210   06/20/21 1800  nafcillin 12 g in sodium chloride 0.9 % 500 mL continuous infusion  Status:  Discontinued        12 g 20.8 mL/hr over 24 Hours Intravenous Every 24 hours 06/20/21 1005 06/29/21 1411   06/20/21 1000  vancomycin  (VANCOREADY) IVPB 750 mg/150 mL  Status:  Discontinued        750 mg 150 mL/hr over 60 Minutes Intravenous Every 12 hours 06/19/21 2210 06/20/21 1005   06/20/21 0800  acyclovir (ZOVIRAX) 820 mg in dextrose 5 % 250 mL IVPB  Status:  Discontinued        10 mg/kg  81.9 kg 266.4 mL/hr over 60 Minutes Intravenous Every 12 hours 06/19/21 2207 06/20/21 1005   06/20/21 0600  cefTRIAXone (ROCEPHIN) 2 g in sodium chloride 0.9 % 100 mL IVPB  Status:  Discontinued        2 g 200 mL/hr over 30 Minutes Intravenous Every 12 hours 06/19/21 1825 06/20/21 1005   06/19/21 1830  acyclovir (ZOVIRAX) 820 mg in dextrose 5 % 250 mL IVPB        10 mg/kg  81.8 kg (Order-Specific) 266.4 mL/hr over 60 Minutes Intravenous STAT 06/19/21 1817 06/19/21 1956   06/19/21 1830  vancomycin (VANCOCIN) 1,750 mg in sodium chloride 0.9 % 500 mL IVPB        1,750 mg 258.8 mL/hr over 120 Minutes Intravenous STAT 06/19/21 1817 06/20/21 0000   06/19/21 1730  vancomycin (VANCOCIN) IVPB 1000 mg/200 mL premix  Status:  Discontinued        1,000 mg 200 mL/hr over 60 Minutes Intravenous STAT 06/19/21 1725 06/19/21 1817   06/19/21 1700  cefTRIAXone (ROCEPHIN) 2 g in sodium chloride 0.9 % 100 mL IVPB        2 g 200 mL/hr over 30 Minutes Intravenous  Once 06/19/21 1655 06/19/21 1826       Subjective:  Patient seen and examined at the bedside this morning.  She looks comfortable today.  She was sitting on the chair.  Continue any current distress.  Tries to obey commands and brings her left hand to shake.  Weak on right lower extremity, bilateral lower extremities Objective: Vitals:   07/08/21 2300 07/09/21 0100 07/09/21 0348 07/09/21 0800  BP:  93/64 (!) 99/58 108/72  Pulse: 85  90 94  Resp: 13  19 20   Temp: 98.2 F (36.8 C)  98 F (36.7 C) 97.7 F (36.5 C)  TempSrc: Oral  Oral Axillary  SpO2: 100%  99% 98%  Weight:   96 kg   Height:        Intake/Output Summary (Last 24 hours) at 07/09/2021 1131 Last data filed at  07/09/2021 0900 Gross per 24 hour  Intake 1200.13 ml  Output 600 ml  Net 600.13 ml   Filed Weights   07/07/21 0436 07/08/21 0353 07/09/21 0348  Weight: 95.6 kg 96 kg 96 kg    Examination:   General exam: Overall comfortable, not in distress, obese, significantly deconditioned HEENT: PERRL Respiratory system:  no wheezes or crackles  Cardiovascular system: S1 & S2 heard, RRR.  Gastrointestinal system: Abdomen  is nondistended, soft and nontender. Central nervous system: Alert and awake, aphasic, weak on right upper extremity, paraplegia, follows commands Extremities: Trace bilateral lower extremity edema, no clubbing ,no cyanosis Skin: No rashes, no ulcers,no icterus     Data Reviewed: I have personally reviewed following labs and imaging studies  CBC: Recent Labs  Lab 07/03/21 0430 07/04/21 0446 07/05/21 0210 07/08/21 0455 07/09/21 0515  WBC 17.4* 16.4* 15.2* 12.4* 10.5  HGB 8.7* 8.5* 8.6* 9.5* 8.6*  HCT 26.0* 25.7* 27.3* 29.6* 26.0*  MCV 84.7 86.5 89.2 86.8 87.8  PLT 381 382 425* 462* 212*   Basic Metabolic Panel: Recent Labs  Lab 07/03/21 0430 07/04/21 0446 07/08/21 0455 07/09/21 0515  NA 142 142 138 135  K 3.8 3.7 2.4* 3.1*  CL 113* 109 101 102  CO2 24 26 26 26   GLUCOSE 138* 115* 104* 94  BUN 19 18 6 7   CREATININE 0.77 0.82 0.76 0.75  CALCIUM 7.8* 8.4* 8.4* 8.2*  MG  --   --  1.7  --   PHOS  --   --  3.3  --      No results found for this or any previous visit (from the past 240 hour(s)).    Radiology Studies: No results found.  Scheduled Meds:  amiodarone  200 mg Oral BID   aspirin  81 mg Oral Daily   benztropine  1 mg Oral QHS   bisacodyl  10 mg Rectal Daily   Chlorhexidine Gluconate Cloth  6 each Topical Daily   cloZAPine  50 mg Oral QHS   enoxaparin (LOVENOX) injection  40 mg Subcutaneous Q24H   fluPHENAZine  1 mg Oral QHS   furosemide  40 mg Oral Daily   insulin aspart  0-24 Units Subcutaneous Q6H   metoprolol tartrate  12.5 mg Oral  BID   Or   metoprolol tartrate  12.5 mg Per Tube BID   multivitamin with minerals  1 tablet Oral Daily   mouth rinse  15 mL Mouth Rinse 4 times per day   pantoprazole sodium  40 mg Oral Daily   potassium chloride  40 mEq Oral Daily   vitamin B-12  1,000 mcg Oral Daily   Continuous Infusions:  sodium chloride     feeding supplement (OSMOLITE 1.5 CAL) 1,000 mL (07/02/21 2346)   nafcillin 12 g in sodium chloride 0.9 % 500 mL continuous infusion 12 g (07/09/21 0840)     LOS: 20 days   Shelly Coss, MD [facility] P6/28/2023, 11:31 AM

## 2021-07-09 NOTE — Progress Notes (Signed)
Mobility Specialist: Progress Note   07/09/21 1242  Mobility  Activity Transferred from chair to bed  Level of Assistance +2 (takes two people)  Assistive Device MaxiMove  LLE Weight Bearing WBAT  Activity Response Tolerated well  $Mobility charge 1 Mobility   Pt assisted back to bed per request. Pt c/o LLE pain during transfer, otherwise asymptomatic. Pt back in bed with call bell at her side and family present in the room.   [facility] Jenisis Harmsen Mobility Specialist Mobility Specialist [address]: [phone]

## 2021-07-09 NOTE — TOC Progression Note (Signed)
Transition of Care (TOC) - Progression Note  Marvetta Gibbons RN, BSN Transitions of Care Unit 4E- RN Case Manager See Treatment Team for direct phone #    Patient Details  Name: Debra Klein MRN: [id number] Date of Birth: 03/18/1975  Transition of Care [facility]) CM/SW Contact  Dahlia Client, Romeo Rabon, RN Phone Number: 07/09/2021, 11:33 AM  Clinical Narrative:    Damaris Schooner with Anderson Malta from [facility], she has spoken with pt's mother and sister as well as met with patient at the bedside. Per Darcella Cheshire will submit for insurance auth for potential admission.    Expected Discharge Plan: Skilled Nursing Facility Barriers to Discharge: SNF Pending bed offer  Expected Discharge Plan and Services Expected Discharge Plan: [place] In-house Referral: Clinical Social Work Discharge Planning Services: CM Consult Post Acute Care Choice: [place] arrangements for the past 2 months: Single Family Home                                       Social Determinants of Health (SDOH) Interventions    Readmission Risk Interventions     No data to display

## 2021-07-10 DIAGNOSIS — R6520 Severe sepsis without septic shock: Secondary | ICD-10-CM | POA: Diagnosis not present

## 2021-07-10 DIAGNOSIS — A4101 Sepsis due to Methicillin susceptible Staphylococcus aureus: Secondary | ICD-10-CM | POA: Diagnosis not present

## 2021-07-10 DIAGNOSIS — G9341 Metabolic encephalopathy: Secondary | ICD-10-CM | POA: Diagnosis not present

## 2021-07-10 LAB — COMPREHENSIVE METABOLIC PANEL
ALT: 32 U/L (ref 0–44)
AST: 29 U/L (ref 15–41)
Albumin: 1.6 g/dL — ABNORMAL LOW (ref 3.5–5.0)
Alkaline Phosphatase: 45 U/L (ref 38–126)
Anion gap: 4 — ABNORMAL LOW (ref 5–15)
BUN: 5 mg/dL — ABNORMAL LOW (ref 6–20)
CO2: 27 mmol/L (ref 22–32)
Calcium: 8.3 mg/dL — ABNORMAL LOW (ref 8.9–10.3)
Chloride: 106 mmol/L (ref 98–111)
Creatinine, Ser: 0.8 mg/dL (ref 0.44–1.00)
GFR, Estimated: >60 mL/min (ref >60)
Glucose, Bld: 96 mg/dL (ref 70–99)
Potassium: 2.9 mmol/L — ABNORMAL LOW (ref 3.5–5.1)
Sodium: 137 mmol/L (ref 135–145)
Total Bilirubin: 1.3 mg/dL — ABNORMAL HIGH (ref 0.3–1.2)
Total Protein: 5.9 g/dL — ABNORMAL LOW (ref 6.5–8.1)

## 2021-07-10 LAB — CBC
HCT: 25.2 % — ABNORMAL LOW (ref 36.0–46.0)
Hemoglobin: 8.2 g/dL — ABNORMAL LOW (ref 12.0–15.0)
MCH: 28.6 pg (ref 26.0–34.0)
MCHC: 32.5 g/dL (ref 30.0–36.0)
MCV: 87.8 fL (ref 80.0–100.0)
Platelets: 458 10*3/uL — ABNORMAL HIGH (ref 150–400)
RBC: 2.87 MIL/uL — ABNORMAL LOW (ref 3.87–5.11)
RDW: 20.8 % — ABNORMAL HIGH (ref 11.5–15.5)
WBC: 9.7 10*3/uL (ref 4.0–10.5)
nRBC: 0.2 % (ref 0.0–0.2)

## 2021-07-10 LAB — GLUCOSE, CAPILLARY
Glucose-Capillary: 123 mg/dL — ABNORMAL HIGH (ref 70–99)
Glucose-Capillary: 91 mg/dL (ref 70–99)
Glucose-Capillary: 93 mg/dL (ref 70–99)
Glucose-Capillary: 97 mg/dL (ref 70–99)

## 2021-07-10 LAB — C-REACTIVE PROTEIN: CRP: 14.2 mg/dL — ABNORMAL HIGH (ref <1.0)

## 2021-07-10 LAB — MAGNESIUM: Magnesium: 1.9 mg/dL (ref 1.7–2.4)

## 2021-07-10 MED ORDER — PANTOPRAZOLE SODIUM 40 MG PO TBEC
40.0000 mg | DELAYED_RELEASE_TABLET | Freq: Every day | ORAL | Status: DC
  Administered 2021-07-10 – 2021-07-17 (×8): 40 mg via ORAL
  Filled 2021-07-10 (×8): qty 1

## 2021-07-10 MED ORDER — POTASSIUM CHLORIDE 20 MEQ PO PACK
40.0000 meq | PACK | Freq: Two times a day (BID) | ORAL | Status: DC
  Administered 2021-07-10 – 2021-07-12 (×5): 40 meq via ORAL
  Filled 2021-07-10 (×6): qty 2

## 2021-07-10 MED ORDER — LORAZEPAM 0.5 MG PO TABS: 0.5000 mg | ORAL_TABLET | Freq: Once | ORAL | Status: DC

## 2021-07-10 MED ORDER — MAGNESIUM SULFATE IN D5W 1-5 GM/100ML-% IV SOLN
1.0000 g | Freq: Once | INTRAVENOUS | Status: AC
  Administered 2021-07-10: 1 g via INTRAVENOUS
  Filled 2021-07-10: qty 100

## 2021-07-10 NOTE — Progress Notes (Signed)
PROGRESS NOTE  Debra Klein  LOV:[id number] DOB: 1975-12-09 DOA: 06/19/2021 PCP: Gaynelle Arabian, MD   Brief Narrative: Patient is a 46 year old female with history of schizophrenia , hypertension, hyperlipidemia who initially presented with acute encephalopathy.  No report of eating or drinking for 2 weeks, not taking medications, having multiple falls and recently sustained fracture on the left ankle, placed on cam boot.  She was found on the floor.  On presentation she was found to have fever, leukocytosis, AKI, elevated LFT, hyperkalemia, lactic acidosis.  Patient was started on IV fluids, empiric antibiotics and was admitted.  Her encephalopathy continued to deteriorate and she was finally intubated and was transferred to [facility] service.  CT head showed insular hypodensity concerning for MCA infarct.  Hospital course also remarkable for finding of bacteremia,mitral valve endocarditis, status post mitral valve repair.  ID was following.  Prolonged hospitalization.  Patient transferred to [facility] service on 6/22.  Currently hemodynamically stable.  PT/OT recommending acute inpatient rehab on discharge ,TOC following.  Medically stable for dc when possible.  Important events  6/7: Fall --> fibula fracture, Minimally verbal at an outpatient visit with [facility]  6/8: Presented to the ED nonverbal with altered mentation. Progressively worsening mentation with requirement of intubation for airway protection.  Empirically treated for sepsis, presumed meningitis. MRI with large MCA infarct and punctate right hemisphere infarcts.  6/9: Blood cultures obtained on arrival positive for MSSA  6/10 MRI knee demonstrates septic arthritis.  6/13 L knee arthroscopy with I&D with snyovectomy for septic arthritis, TEE 6/14 steroids for no cuff leak 6/15 extubated, much more alert 6/16 mitral valve repair. Left chest tube placed for pleural effusion  6/17 too weak to come off vent. Not following commands.  6/18  extubated 6/19 required BiPAP due to decreased responsiveness overnight after getting antipsychotic meds    Assessment & Plan:  Principal Problem:   Sepsis ([place]) Active Problems:   Schizophrenia ([place])   Transaminitis   AKI (acute kidney injury) ([place])   Bacteremia due to methicillin susceptible Staphylococcus aureus (MSSA)   Toxic encephalopathy   Cerebrovascular accident (CVA) due to embolic occlusion of left middle cerebral artery (HCC)   Acute respiratory failure with hypoxia (HCC)   Septic arthritis of knee, left (HCC)   S/P mitral valve repair   Pressure injury of skin   Acute bacterial endocarditis  Sepsis/MSSA bacteremia/Staph epidermidis mitral valve endocarditis: ID was following.  Continue current antibiotics.  Still has leukocytosis but improving.  Afebrile.  Requires long-term antibiotic therapy.  Currently on nafcillin, plan to continue through 7/28. Has a picc line on right upper extremity  Staph epidermidis mitral valve endocarditis: Cardiothoracic surgery following, status post mitral valve repair.  Acute metabolic encephalopathy: Secondary to sepsis, stroke.  Mental status has significantly improved and is currently alert and oriented.  Still has some degree of dysarthria but significantly better.  left MCA stroke due to septic emboli: Neurology was following.  Continue to monitor mental status.  Has paraplegia.  Continue aspirin  Brief episode of A-fib: Cardiothoracic surgery started on amiodarone.  Also on metoprolol.  Currently in normal sinus rhythm.  Acute respiratory failure: Secondary to left-sided atelectasis, left-sided pleural effusion.  Currently on room air.  Encourage incentive spirometry ,flutter valve  Dysphagia: Secondary to stroke.  She was on core track.  Speech therapy following, recommending dysphagia 1 diet, core track taken out.  Cardiogenic shock: Suspected to be from mitral valve regurgitation.  She was on IV diuresis.  She was on inotropes,  vasopressors, now discontinued.  Currently blood pressure stable.  IV Lasix will be discontinued, started on Lasix 40 mg daily.    Severe hypokalemia: Continue to monitor and supplement,Mg also supplemented today  Left knee septic arthritis: MRI of the left knee on 6/10 showed septic arthritis, peritubular osteomyelitis with subcu abscess.  Status post diagnostic arthrocentesis on 6/12.  Cultures showed MSSA.  Status post left knee arthroscopy, I&D, synovectomy.  Continue current antibiotics.  She needs outpatient follow-up with orthopedics  Normocytic anemia: Currently hemoglobin stable.  Diarrhea: Improved,rectal tube out  Prediabetes: Hemoglobin A1c of 6.1.  Currently on sliding scale insulin  History of schizophrenia: Continue fluphenazine, clozapine  Disposition: PT/OT recommended acute inpatient rehab.  TOC following     Nutrition Problem: Increased nutrient needs Etiology:  (endocarditis) Pressure Injury 06/27/21 Buttocks Posterior;Medial Stage 2 -  Partial thickness loss of dermis presenting as a shallow open injury with a red, pink wound bed without slough. (Active)  06/27/21 1900  Location: Buttocks  Location Orientation: Posterior;Medial  Staging: Stage 2 -  Partial thickness loss of dermis presenting as a shallow open injury with a red, pink wound bed without slough.  Wound Description (Comments):   Present on Admission:   Dressing Type Foam - Lift dressing to assess site every shift 07/09/21 2200    DVT prophylaxis:enoxaparin (LOVENOX) injection 40 mg Start: 07/02/21 2000 SCDs Start: 06/27/21 1518     Code Status: Full Code  Family Communication:: Discussed with mother on phone on 6/23  Patient status: Inpatient  Patient is from : Home  Anticipated discharge to: Inpatient rehab   Estimated DC date: Not sure   Consultants: PCCM, cardiology, cardiothoracic surgery, neurology, ID  Procedures: As above  Antimicrobials:  Anti-infectives (From admission,  onward)    Start     Dose/Rate Route Frequency Ordered Stop   06/30/21 1600  vancomycin (VANCOREADY) IVPB 1750 mg/350 mL  Status:  Discontinued        1,750 mg 175 mL/hr over 120 Minutes Intravenous Every 24 hours 06/29/21 1429 06/30/21 0746   06/30/21 1600  vancomycin (VANCOREADY) IVPB 1250 mg/250 mL  Status:  Discontinued        1,250 mg 166.7 mL/hr over 90 Minutes Intravenous Every 24 hours 06/30/21 0746 06/30/21 1351   06/30/21 1500  nafcillin 12 g in sodium chloride 0.9 % 500 mL continuous infusion        12 g 20.8 mL/hr over 24 Hours Intravenous Every 24 hours 06/30/21 1351     06/29/21 1515  vancomycin (VANCOREADY) IVPB 2000 mg/400 mL        2,000 mg 200 mL/hr over 120 Minutes Intravenous  Once 06/29/21 1416 06/29/21 1700   06/27/21 2215  vancomycin (VANCOCIN) IVPB 1000 mg/200 mL premix        1,000 mg 200 mL/hr over 60 Minutes Intravenous  Once 06/27/21 1517 06/27/21 2359   06/27/21 1615  ceFAZolin (ANCEF) IVPB 2g/100 mL premix        2 g 200 mL/hr over 30 Minutes Intravenous Every 8 hours 06/27/21 1517 06/29/21 0538   06/27/21 0400  vancomycin (VANCOCIN) 1,500 mg in sodium chloride 0.9 % 250 mL IVPB        1,500 mg 132.5 mL/hr over 120 Minutes Intravenous To Surgery 06/26/21 1345 06/27/21 1135   06/27/21 0400  ceFAZolin (ANCEF) IVPB 2g/100 mL premix        2 g 200 mL/hr over 30 Minutes Intravenous To Surgery 06/26/21 1345 06/27/21 0945   06/27/21 0400  ceFAZolin (ANCEF) IVPB 2g/100 mL premix        2 g 200 mL/hr over 30 Minutes Intravenous To Surgery 06/26/21 1345 06/27/21 1419   06/20/21 2200  vancomycin (VANCOREADY) IVPB 1250 mg/250 mL  Status:  Discontinued        1,250 mg 166.7 mL/hr over 90 Minutes Intravenous Every 24 hours 06/19/21 2203 06/19/21 2210   06/20/21 1800  nafcillin 12 g in sodium chloride 0.9 % 500 mL continuous infusion  Status:  Discontinued        12 g 20.8 mL/hr over 24 Hours Intravenous Every 24 hours 06/20/21 1005 06/29/21 1411   06/20/21 1000   vancomycin (VANCOREADY) IVPB 750 mg/150 mL  Status:  Discontinued        750 mg 150 mL/hr over 60 Minutes Intravenous Every 12 hours 06/19/21 2210 06/20/21 1005   06/20/21 0800  acyclovir (ZOVIRAX) 820 mg in dextrose 5 % 250 mL IVPB  Status:  Discontinued        10 mg/kg  81.9 kg 266.4 mL/hr over 60 Minutes Intravenous Every 12 hours 06/19/21 2207 06/20/21 1005   06/20/21 0600  cefTRIAXone (ROCEPHIN) 2 g in sodium chloride 0.9 % 100 mL IVPB  Status:  Discontinued        2 g 200 mL/hr over 30 Minutes Intravenous Every 12 hours 06/19/21 1825 06/20/21 1005   06/19/21 1830  acyclovir (ZOVIRAX) 820 mg in dextrose 5 % 250 mL IVPB        10 mg/kg  81.8 kg (Order-Specific) 266.4 mL/hr over 60 Minutes Intravenous STAT 06/19/21 1817 06/19/21 1956   06/19/21 1830  vancomycin (VANCOCIN) 1,750 mg in sodium chloride 0.9 % 500 mL IVPB        1,750 mg 258.8 mL/hr over 120 Minutes Intravenous STAT 06/19/21 1817 06/20/21 0000   06/19/21 1730  vancomycin (VANCOCIN) IVPB 1000 mg/200 mL premix  Status:  Discontinued        1,000 mg 200 mL/hr over 60 Minutes Intravenous STAT 06/19/21 1725 06/19/21 1817   06/19/21 1700  cefTRIAXone (ROCEPHIN) 2 g in sodium chloride 0.9 % 100 mL IVPB        2 g 200 mL/hr over 30 Minutes Intravenous  Once 06/19/21 1655 06/19/21 1826       Subjective: Patient seen and examined at the bedside this morning.  Hemodynamically stable.  Very comfortable, alert and oriented today.  Speaks though he still has some degree of dysarthria .  Weak on the bilateral lower extremities.   Objective: Vitals:   07/09/21 2258 07/10/21 0533 07/10/21 0811 07/10/21 1108  BP: (!) 91/57 116/69 97/80 115/80  Pulse: 87 90 100   Resp: 18 20 18 18   Temp: (!) 97.3 F (36.3 C) 97.9 F (36.6 C) 98.1 F (36.7 C) 98.2 F (36.8 C)  TempSrc: Axillary Oral Oral Oral  SpO2: 97%  96% 96%  Weight:  97.5 kg    Height:        Intake/Output Summary (Last 24 hours) at 07/10/2021 1157 Last data filed at  07/10/2021 1134 Gross per 24 hour  Intake 486.64 ml  Output 1350 ml  Net -863.36 ml   Filed Weights   07/08/21 0353 07/09/21 0348 07/10/21 0533  Weight: 96 kg 96 kg 97.5 kg    Examination:   General exam: Overall comfortable, not in distress HEENT: PERRL Respiratory system:  no wheezes or crackles  Cardiovascular system: S1 & S2 heard, RRR.  Gastrointestinal system: Abdomen is nondistended, soft and nontender. Central nervous  system: Alert and oriented, paraplegia Extremities: No edema, no clubbing ,no cyanosis Skin: No rashes, no ulcers,no icterus     Data Reviewed: I have personally reviewed following labs and imaging studies  CBC: Recent Labs  Lab 07/05/21 0210 07/08/21 0455 07/09/21 0515 07/09/21 1431 07/10/21 0505  WBC 15.2* 12.4* 10.5 13.4* 9.7  NEUTROABS  --   --   --  11.1*  --   HGB 8.6* 9.5* 8.6* 9.1* 8.2*  HCT 27.3* 29.6* 26.0* 28.3* 25.2*  MCV 89.2 86.8 87.8 87.3 87.8  PLT 425* 462* 469* 510* 638*   Basic Metabolic Panel: Recent Labs  Lab 07/04/21 0446 07/08/21 0455 07/09/21 0515 07/10/21 0505  NA 142 138 135 137  K 3.7 2.4* 3.1* 2.9*  CL 109 101 102 106  CO2 26 26 26 27   GLUCOSE 115* 104* 94 96  BUN 18 6 7  5*  CREATININE 0.82 0.76 0.75 0.80  CALCIUM 8.4* 8.4* 8.2* 8.3*  MG  --  1.7  --  1.9  PHOS  --  3.3  --   --      No results found for this or any previous visit (from the past 240 hour(s)).    Radiology Studies: No results found.  Scheduled Meds:  amiodarone  200 mg Oral BID   aspirin  81 mg Oral Daily   benztropine  1 mg Oral QHS   bisacodyl  10 mg Rectal Daily   Chlorhexidine Gluconate Cloth  6 each Topical Daily   cloZAPine  50 mg Oral QHS   enoxaparin (LOVENOX) injection  40 mg Subcutaneous Q24H   fluPHENAZine  1 mg Oral QHS   furosemide  40 mg Oral Daily   insulin aspart  0-24 Units Subcutaneous Q6H   LORazepam  0.5 mg Oral Once   metoprolol tartrate  12.5 mg Oral BID   Or   metoprolol tartrate  12.5 mg Per Tube BID    multivitamin with minerals  1 tablet Oral Daily   mouth rinse  15 mL Mouth Rinse 4 times per day   pantoprazole  40 mg Oral Daily   potassium chloride  40 mEq Oral BID   vitamin B-12  1,000 mcg Oral Daily   Continuous Infusions:  sodium chloride     feeding supplement (OSMOLITE 1.5 CAL) 1,000 mL (07/02/21 2346)   magnesium sulfate bolus IVPB     nafcillin 12 g in sodium chloride 0.9 % 500 mL continuous infusion 12 g (07/09/21 0840)     LOS: 21 days   Shelly Coss, MD [facility] P6/29/2023, 11:57 AM

## 2021-07-10 NOTE — Progress Notes (Signed)
Speech Language Pathology Treatment: Dysphagia  Patient Details Name: Debra Klein MRN: [id number] DOB: [birth year] Today's Date: 07/10/2021 Time: 3748-2707 SLP Time Calculation (min) (ACUTE ONLY): 16 min  Assessment / Plan / Recommendation Clinical Impression  SWALLOWING Pt tolerated puree and nectar thick liquid by straw without overt s/s of aspiration.  Pt noted to take rapid, serial straw sips. Pt with restlessness, especially with RUE.  Pt declined all offers of solid POs.  Mother arrived during session and reports that pt is not taking much orally.  Pt states that she does not want to advance diet, but if she is agreeable early next week, consider repeat MBSS to determine if liquids can be advanced given silent aspiration observed on MBS 6/22.  Of note, pt with kool aid gelatin container on bedside table.  Gelatin is a thin liquid and pt should not have these products.  Unknown if item was from floor stock or brought by family. SLP removed gelatin and provided education to RN regarding gelatin and thickened liquid diet restrictions.  COMMUNICATION Pt was awake and alert today. MD notes significant improvement in mental status. Pt speaking in full sentences, reading words, like logo on SLP's scrubs. Given improvement she has exhibited in her language, pt may benefit from assessment of more advanced cognitive linguistic abilities.    HPI HPI: Patient is a 46 y.o. female with PMH: HTN, HLD, iron deficiency, anemia, schizophrenia who presented to the hospital on 06/17/21 to ED for evaluation of AMS. Patient initially non-verbal but awake and alert in ED. Mother reported patient as ambulatory and verbal at baseline but had not been eating or drinking for past two weeks (also not taking medications). She has had multiple falls, seen by orthopedic day before admission and questioned possible fracture left ankle and placed in Cam boot. In ED, patient admitted with severe sepsis of unknown source; MRI  showed large Left MCA infarct. Patient required intubation on 6/8-6/15/23 and was intubated for surgical procedure (mitral valve repair) on 6/16, extubated 6/18. Cortrak 6/19.      SLP Plan  Continue with current plan of care;MBS      Recommendations for follow up therapy are one component of a multi-disciplinary discharge planning process, led by the attending physician.  Recommendations may be updated based on patient status, additional functional criteria and insurance authorization.    Recommendations  Diet recommendations: Dysphagia 1 (puree);Nectar-thick liquid Liquids provided via: Cup;Straw Medication Administration: Crushed with puree Supervision: Staff to assist with self feeding;Full supervision/cueing for compensatory strategies Compensations: Slow rate;Small sips/bites;Lingual sweep for clearance of pocketing;Minimize environmental distractions Postural Changes and/or Swallow Maneuvers: Seated upright 90 degrees                Follow Up Recommendations: Acute inpatient rehab (3hours/day) Assistance recommended at discharge: Frequent or constant Supervision/Assistance SLP Visit Diagnosis: Dysphagia, oropharyngeal phase (R13.12) Plan: Continue with current plan of care;MBS           Debra Klein Debra Klein  07/10/2021, 12:37 PM

## 2021-07-10 NOTE — Progress Notes (Signed)
Physical Therapy Treatment Patient Details Name: Debra Klein MRN: [id number] DOB: Apr 14, 1975 Today's Date: 07/10/2021   History of Present Illness Pt is a 46 y.o. female admitted 06/19/2021 with AMS, poor PO intake and recent falls, one resulting in L ankle fx. Workup for severe sepsis, septic arthritis of L knee. MRI 6/8 shows large L MCA infarct. S/p L knee arthroscopy with debridement and synovectomy 6/13. Pt intubated 6/8-6/14. Reintubated for MVR on 6/16; extubated 6/18. Cortrak placed 6/19, pt pulled it out 6/22. PMH includes schizophrenia, HTN, anemia.    PT Comments    Pt continues to make progress towards acute goals. Pt demonstrating increased attention, engagement, activity tolerance and ability to follow commands this session. Pt continues to require +2 mod a to come to standing with in stedy frame from EOB to power up but noted increased in initiation to full standing once weight bearing through BLE's. Pt able to come standing with min a from stedy seat, however needing max cues for upright.Pt continues to benefit from skilled PT services to progress toward functional mobility goals.     Recommendations for follow up therapy are one component of a multi-disciplinary discharge planning process, led by the attending physician.  Recommendations may be updated based on patient status, additional functional criteria and insurance authorization.  Follow Up Recommendations  PT at Long-term acute care hospital     Assistance Recommended at Discharge Frequent or constant Supervision/Assistance  Patient can return home with the following Two people to help with walking and/or transfers;Two people to help with bathing/dressing/bathroom;Assistance with cooking/housework;Assistance with feeding;Direct supervision/assist for medications management;Direct supervision/assist for financial management;Assist for transportation;Help with stairs or ramp for entrance   Equipment Recommendations   Hospital bed;Wheelchair (measurements PT);Wheelchair cushion (measurements PT);Other (comment)    Recommendations for Other Services Rehab consult     Precautions / Restrictions Precautions Precautions: Fall;Sternal Restrictions Weight Bearing Restrictions: Yes LLE Weight Bearing: Weight bearing as tolerated Other Position/Activity Restrictions: pt OK to not wear CAM boot per Dr. Ninfa Linden     Mobility  Bed Mobility Overal bed mobility: Needs Assistance Bed Mobility: Supine to Sit, Sit to Supine     Supine to sit: Min assist, +2 for physical assistance Sit to supine: Max assist, +2 for physical assistance   General bed mobility comments: Min A to bring BLEs over to EOB and then initate elevating trunk. Max A +2 for return back to supine    Transfers Overall transfer level: Needs assistance Equipment used: Ambulation equipment used Transfers: Sit to/from Stand Sit to Stand: Max assist, +2 physical assistance           General transfer comment: Max A +2 for power up into standing; but once initiated and engaged    Ambulation/Gait                   Stairs             Wheelchair Mobility    Modified Rankin (Stroke Patients Only)       Balance Overall balance assessment: Needs assistance Sitting-balance support: No upper extremity supported, Feet supported Sitting balance-Leahy Scale: Fair       Standing balance-Leahy Scale: Poor                              Cognition Arousal/Alertness: Awake/alert Behavior During Therapy: Flat affect Overall Cognitive Status: Impaired/Different from baseline Area of Impairment: Attention, Following commands, Safety/judgement, Awareness, Problem solving, Memory  Current Attention Level: Sustained Memory: Decreased short-term memory Following Commands: Follows one step commands inconsistently, Follows one step commands with increased time Safety/Judgement: Decreased  awareness of deficits Awareness: Intellectual Problem Solving: Slow processing, Decreased initiation, Requires tactile cues, Requires verbal cues General Comments: Pt more resistant to OOB activity this session (after session stating she didnt think she could do all that and appreciative of therapy) and benefits from calm, encouragement. Pt requiring increased time for responding to cues both in conversation and motor.        Exercises      General Comments General comments (skin integrity, edema, etc.): mother present and supportive throughout, VSS on RA      Pertinent Vitals/Pain Pain Assessment Pain Assessment: Faces Faces Pain Scale: Hurts little more Pain Location: LLE Pain Descriptors / Indicators: Grimacing, Guarding, Moaning Pain Intervention(s): Monitored during session    Home Living                          Prior Function            PT Goals (current goals can now be found in the care plan section) Acute Rehab PT Goals PT Goal Formulation: With family Time For Goal Achievement: 07/17/21    Frequency    Min 3X/week      PT Plan Current plan remains appropriate    Co-evaluation   Reason for Co-Treatment: For patient/therapist safety;To address functional/ADL transfers   OT goals addressed during session: ADL's and self-care      AM-PAC PT "6 Clicks" Mobility   Outcome Measure  Help needed turning from your back to your side while in a flat bed without using bedrails?: Total Help needed moving from lying on your back to sitting on the side of a flat bed without using bedrails?: Total Help needed moving to and from a bed to a chair (including a wheelchair)?: Total Help needed standing up from a chair using your arms (e.g., wheelchair or bedside chair)?: Total Help needed to walk in hospital room?: Total Help needed climbing 3-5 steps with a railing? : Total 6 Click Score: 6    End of Session Equipment Utilized During Treatment: Gait  belt Activity Tolerance: Patient tolerated treatment well Patient left: with call bell/phone within reach;in bed;with family/visitor present Nurse Communication: Mobility status PT Visit Diagnosis: Other abnormalities of gait and mobility (R26.89);Muscle weakness (generalized) (M62.81);Hemiplegia and hemiparesis Hemiplegia - Right/Left: Right Hemiplegia - dominant/non-dominant: Dominant Hemiplegia - caused by: Cerebral infarction     Time: 1610-9604 PT Time Calculation (min) (ACUTE ONLY): 29 min  Charges:  $Therapeutic Activity: 8-22 mins                     Ryin Schillo R. PTA Acute Rehabilitation Services Office: [place] 07/10/2021, 3:53 PM

## 2021-07-10 NOTE — Progress Notes (Addendum)
Occupational Therapy Treatment Patient Details Name: Debra Klein MRN: [id number] DOB: Jan 30, 1975 Today's Date: 07/10/2021   History of present illness Pt is a 46 y.o. female admitted 06/19/2021 with AMS, poor PO intake and recent falls, one resulting in L ankle fx. Workup for severe sepsis, septic arthritis of L knee. MRI 6/8 shows large L MCA infarct. S/p L knee arthroscopy with debridement and synovectomy 6/13. Pt intubated 6/8-6/14. Reintubated for MVR on 6/16; extubated 6/18. Cortrak placed 6/19, pt pulled it out 6/22. PMH includes schizophrenia, HTN, anemia.   OT comments  Pt continues to progress towards established OT goals. Pt demonstrating increased verbalization, attention, following commands, and activity tolerance this session. Pt did report (at end of session) feeling like "I didn't think I could do all that" and benefited from encouragement throughout. Pt performing grooming while seated at sink on sara stedy. Requiring Min Guard-Min A for sitting balance and Max cues for attention and sequencing. Providing red tubing and cup with handles for self feeding. Pt requiring self feeding with Min hand over hand to use RUE and increased encouragement. Continue to recommend dc to AIR and will continue to follow acutely as admitted. Updated OT goals.    Recommendations for follow up therapy are one component of a multi-disciplinary discharge planning process, led by the attending physician.  Recommendations may be updated based on patient status, additional functional criteria and insurance authorization.    Follow Up Recommendations  Acute inpatient rehab (3hours/day)    Assistance Recommended at Discharge Frequent or constant Supervision/Assistance  Patient can return home with the following  Two people to help with walking and/or transfers;Two people to help with bathing/dressing/bathroom   Equipment Recommendations  Other (comment) (TBA)    Recommendations for Other Services Rehab  consult    Precautions / Restrictions Precautions Precautions: Fall;Sternal Restrictions Weight Bearing Restrictions: Yes LLE Weight Bearing: Weight bearing as tolerated Other Position/Activity Restrictions: pt OK to not wear CAM boot per Dr. Ninfa Linden       Mobility Bed Mobility Overal bed mobility: Needs Assistance Bed Mobility: Supine to Sit, Sit to Supine     Supine to sit: Min assist, +2 for physical assistance Sit to supine: Max assist, +2 for physical assistance   General bed mobility comments: Min A to bring BLEs over to EOB and then initate elevating trunk. Max A +2 for return back to supine    Transfers Overall transfer level: Needs assistance Equipment used: Ambulation equipment used Transfers: Sit to/from Stand Sit to Stand: Max assist, +2 physical assistance           General transfer comment: Max A +2 for power up into standing; but once initiated and engaged     Balance Overall balance assessment: Needs assistance Sitting-balance support: No upper extremity supported, Feet supported Sitting balance-Leahy Scale: Fair       Standing balance-Leahy Scale: Poor                             ADL either performed or assessed with clinical judgement   ADL Overall ADL's : Needs assistance/impaired Eating/Feeding: Minimal assistance;Bed level Eating/Feeding Details (indicate cue type and reason): Bed positioned into chair position. Upright posture. Pt requesting to use her RUE for self feeding. Requiring Min hand over hand at elbow and wrist - providing red build up handle for spoon. Pt eating half her mash potatoes and meat adn then a bite of ice cream. Also providing cup with handles  Grooming: Wash/dry face;Minimal assistance;Sitting (sitting in stedy) Grooming Details (indicate cue type and reason): Min Guard A for sitting balance. Min A for support at elbow to wash face. Pt requiring Max cues for step by step performing ; ie "turn on water" "turn  off water" "bring cloth to face" etc.                 Toilet Transfer: Maximal assistance;+2 for physical assistance (sit<>Stand with stedy)           Functional mobility during ADLs: Maximal assistance;+2 for physical assistance (sit<>stand with stedy) General ADL Comments: Pt requiring increased encouragement throughout. Performing grooming at sink, sit<>stand with stedy, and self feeding    Extremity/Trunk Assessment Upper Extremity Assessment Upper Extremity Assessment: RUE deficits/detail;LUE deficits/detail RUE Deficits / Details: Incorporating into ADLs including ringing out wash cloth and self feeding with assistance. Contineus to present with decreased strength and cooridnation. RUE Coordination: decreased fine motor;decreased gross motor LUE Deficits / Details: Continues to present with spastic movement patterns LUE Coordination: decreased gross motor   Lower Extremity Assessment Lower Extremity Assessment: Defer to PT evaluation        Vision       Perception     Praxis      Cognition Arousal/Alertness: Awake/alert Behavior During Therapy: Flat affect Overall Cognitive Status: Impaired/Different from baseline Area of Impairment: Attention, Following commands, Safety/judgement, Awareness, Problem solving, Memory                   Current Attention Level: Sustained Memory: Decreased short-term memory Following Commands: Follows one step commands inconsistently, Follows one step commands with increased time Safety/Judgement: Decreased awareness of deficits Awareness: Intellectual Problem Solving: Slow processing, Decreased initiation, Requires tactile cues, Requires verbal cues General Comments: Pt more resistant to OOB activity this session (after session stating she didnt think she could do all that and appreciative of therapy) and benefits from calm, encouragement. Pt requiring increased time for responding to cues both in conversation and  motor.        Exercises      Shoulder Instructions       General Comments Mother present. VSS    Pertinent Vitals/ Pain       Pain Assessment Pain Assessment: Faces Faces Pain Scale: Hurts little more Pain Location: LLE Pain Descriptors / Indicators: Grimacing, Guarding, Moaning Pain Intervention(s): Monitored during session, Repositioned  Home Living                                          Prior Functioning/Environment              Frequency  Min 2X/week        Progress Toward Goals  OT Goals(current goals can now be found in the care plan section)  Progress towards OT goals: Progressing toward goals  Acute Rehab OT Goals OT Goal Formulation: With patient/family Time For Goal Achievement: 07/24/21 Potential to Achieve Goals: Good ADL Goals Pt Will Perform Grooming: with set-up;with supervision;sitting Pt Will Transfer to Toilet: with mod assist;with +2 assist;stand pivot transfer;bedside commode Additional ADL Goal #1: Pt will follow 2 step commands 50% of time during session Additional ADL Goal #2: Pt will complete bed mobility with Min Guard A in preparation for ADLs  Plan Discharge plan remains appropriate    Co-evaluation    PT/OT/SLP Co-Evaluation/Treatment: Yes Reason for Co-Treatment: For patient/therapist safety;To  address functional/ADL transfers   OT goals addressed during session: ADL's and self-care      AM-PAC OT "6 Clicks" Daily Activity     Outcome Measure   Help from another person eating meals?: A Lot Help from another person taking care of personal grooming?: A Lot Help from another person toileting, which includes using toliet, bedpan, or urinal?: A Lot Help from another person bathing (including washing, rinsing, drying)?: Total Help from another person to put on and taking off regular upper body clothing?: Total Help from another person to put on and taking off regular lower body clothing?: Total 6 Click  Score: 9    End of Session    OT Visit Diagnosis: Unsteadiness on feet (R26.81);Muscle weakness (generalized) (M62.81)   Activity Tolerance Patient tolerated treatment well   Patient Left in bed;with call bell/phone within reach;with chair alarm set;with family/visitor present   Nurse Communication Mobility status        Time: 1222-4114 OT Time Calculation (min): 41 min  Charges: OT General Charges $OT Visit: 1 Visit OT Treatments $Self Care/Home Management : 23-37 mins  Tuwana Kapaun MSOT, OTR/L Acute Rehab Office: [place] 07/10/2021, 2:50 PM

## 2021-07-10 NOTE — Progress Notes (Signed)
Mobility Specialist: Progress Note   07/10/21 1644  Mobility  Activity Transferred from chair to bed  Level of Assistance +2 (takes two people)  Assistive Device MaxiMove  LLE Weight Bearing WBAT  Activity Response Tolerated well  $Mobility charge 1 Mobility   Pt assisted back to bed per RN request as pt was attempting to scoot out of the chair. +2 for safety/lines. Pt back in the bed with call bell and phone in reach.   [facility] Albin Duckett Mobility Specialist Mobility Specialist [address]: [phone]

## 2021-07-10 NOTE — Progress Notes (Signed)
Mobility Specialist: Progress Note   07/10/21 1546  Mobility  Activity Transferred from bed to chair  Level of Assistance +2 (takes two people)  Assistive Device Stedy  LLE Weight Bearing WBAT  Activity Response Tolerated well  $Mobility charge 1 Mobility   Pt received in bed, reluctant but agreeable to transfer. Required modA to sit EOB as well as to stand. Pt is in the chair with call bell in reach. Will f/u to assist pt back to bed.   [facility] Ariell Gunnels Mobility Specialist Mobility Specialist [address]: [phone]

## 2021-07-10 NOTE — Plan of Care (Signed)
  Problem: Nutrition: Goal: Adequate nutrition will be maintained Outcome: Not Progressing   

## 2021-07-11 DIAGNOSIS — A4101 Sepsis due to Methicillin susceptible Staphylococcus aureus: Secondary | ICD-10-CM | POA: Diagnosis not present

## 2021-07-11 DIAGNOSIS — R6520 Severe sepsis without septic shock: Secondary | ICD-10-CM | POA: Diagnosis not present

## 2021-07-11 DIAGNOSIS — G9341 Metabolic encephalopathy: Secondary | ICD-10-CM | POA: Diagnosis not present

## 2021-07-11 LAB — BASIC METABOLIC PANEL
Anion gap: 5 (ref 5–15)
BUN: 5 mg/dL — ABNORMAL LOW (ref 6–20)
CO2: 27 mmol/L (ref 22–32)
Calcium: 8.3 mg/dL — ABNORMAL LOW (ref 8.9–10.3)
Chloride: 106 mmol/L (ref 98–111)
Creatinine, Ser: 0.79 mg/dL (ref 0.44–1.00)
GFR, Estimated: >60 mL/min (ref >60)
Glucose, Bld: 102 mg/dL — ABNORMAL HIGH (ref 70–99)
Potassium: 3.2 mmol/L — ABNORMAL LOW (ref 3.5–5.1)
Sodium: 138 mmol/L (ref 135–145)

## 2021-07-11 LAB — GLUCOSE, CAPILLARY
Glucose-Capillary: 81 mg/dL (ref 70–99)
Glucose-Capillary: 88 mg/dL (ref 70–99)
Glucose-Capillary: 94 mg/dL (ref 70–99)
Glucose-Capillary: 115 mg/dL — ABNORMAL HIGH (ref 70–99)

## 2021-07-11 MED ORDER — AMIODARONE HCL 200 MG PO TABS
200.0000 mg | ORAL_TABLET | Freq: Two times a day (BID) | ORAL | Status: AC
  Administered 2021-07-11: 200 mg via ORAL
  Filled 2021-07-11: qty 1

## 2021-07-11 MED ORDER — AMIODARONE HCL 200 MG PO TABS
200.0000 mg | ORAL_TABLET | Freq: Every day | ORAL | Status: DC
  Administered 2021-07-12 – 2021-07-17 (×6): 200 mg via ORAL
  Filled 2021-07-11 (×6): qty 1

## 2021-07-11 NOTE — Progress Notes (Signed)
PROGRESS NOTE  Debra Klein  EYC:[id number] DOB: [birth year] DOA: 06/19/2021 PCP: Gaynelle Arabian, MD   Brief Narrative: Patient is a 46 year old female with history of schizophrenia , hypertension, hyperlipidemia who initially presented with acute encephalopathy.  No report of eating or drinking for 2 weeks, not taking medications, having multiple falls and recently sustained fracture on the left ankle, placed on cam boot.  She was found on the floor.  On presentation she was found to have fever, leukocytosis, AKI, elevated LFT, hyperkalemia, lactic acidosis.  Patient was started on IV fluids, empiric antibiotics and was admitted.  Her encephalopathy continued to deteriorate and she was finally intubated and was transferred to [facility] service.  CT head showed insular hypodensity concerning for MCA infarct.  Hospital course also remarkable for finding of bacteremia,mitral valve endocarditis, status post mitral valve repair.  ID was following.  Prolonged hospitalization.  Patient transferred to [facility] service on 6/22.  Currently hemodynamically stable.  PT/OT recommending acute inpatient rehab on discharge ,TOC following.  Medically stable for dc when possible.  Important events  6/7: Fall --> fibula fracture, Minimally verbal at an outpatient visit with [facility]  6/8: Presented to the ED nonverbal with altered mentation. Progressively worsening mentation with requirement of intubation for airway protection.  Empirically treated for sepsis, presumed meningitis. MRI with large MCA infarct and punctate right hemisphere infarcts.  6/9: Blood cultures obtained on arrival positive for MSSA  6/10 MRI knee demonstrates septic arthritis.  6/13 L knee arthroscopy with I&D with snyovectomy for septic arthritis, TEE 6/14 steroids for no cuff leak 6/15 extubated, much more alert 6/16 mitral valve repair. Left chest tube placed for pleural effusion  6/17 too weak to come off vent. Not following commands.  6/18  extubated 6/19 required BiPAP due to decreased responsiveness overnight after getting antipsychotic meds    Assessment & Plan:  Principal Problem:   Sepsis ([place]) Active Problems:   Schizophrenia ([place])   Transaminitis   AKI (acute kidney injury) ([place])   Bacteremia due to methicillin susceptible Staphylococcus aureus (MSSA)   Toxic encephalopathy   Cerebrovascular accident (CVA) due to embolic occlusion of left middle cerebral artery (HCC)   Acute respiratory failure with hypoxia (HCC)   Septic arthritis of knee, left (HCC)   S/P mitral valve repair   Pressure injury of skin   Acute bacterial endocarditis  Sepsis/MSSA bacteremia/Staph epidermidis mitral valve endocarditis: ID was following.  Continue current antibiotics.  Still has leukocytosis but improving.  Afebrile.  Requires long-term antibiotic therapy.  Currently on nafcillin, plan to continue through 7/28. Has a picc line on right upper extremity  Staph epidermidis mitral valve endocarditis: Cardiothoracic surgery following, status post mitral valve repair.  Acute metabolic encephalopathy: Secondary to sepsis, stroke.  Mental status has significantly improved and is currently alert and oriented.  Still has some degree of dysarthria but significantly better.  left MCA stroke due to septic emboli: Neurology was following.  Continue to monitor mental status.  Has paraplegia.  Continue aspirin  Brief episode of A-fib: Cardiothoracic surgery started on amiodarone,being tapered.  Also on metoprolol.  Currently in normal sinus rhythm.  Acute respiratory failure: Secondary to left-sided atelectasis, left-sided pleural effusion.  Currently on room air.  Encourage incentive spirometry ,flutter valve  Dysphagia: Secondary to stroke.  She was on core track.  Speech therapy following, recommending dysphagia 1 diet, core track taken out.  Cardiogenic shock: Suspected to be from mitral valve regurgitation.  She was on IV diuresis.  She was  on inotropes, vasopressors, now discontinued.  Currently blood pressure stable.  IV Lasix will be discontinued, started on Lasix 40 mg daily.    Severe hypokalemia: Continue to monitor and supplement,Mg also supplemented today  Left knee septic arthritis: MRI of the left knee on 6/10 showed septic arthritis, peritubular osteomyelitis with subcu abscess.  Status post diagnostic arthrocentesis on 6/12.  Cultures showed MSSA.  Status post left knee arthroscopy, I&D, synovectomy.  Continue current antibiotics.  She needs outpatient follow-up with orthopedics  Normocytic anemia: Currently hemoglobin stable.  Diarrhea: Improved,rectal tube out  Prediabetes: Hemoglobin A1c of 6.1.  Currently on sliding scale insulin  History of schizophrenia: Continue fluphenazine, clozapine  Disposition: PT/OT recommended acute inpatient rehab.  TOC following     Nutrition Problem: Increased nutrient needs Etiology:  (endocarditis) Pressure Injury 06/27/21 Buttocks Posterior;Medial Stage 2 -  Partial thickness loss of dermis presenting as a shallow open injury with a red, pink wound bed without slough. (Active)  06/27/21 1900  Location: Buttocks  Location Orientation: Posterior;Medial  Staging: Stage 2 -  Partial thickness loss of dermis presenting as a shallow open injury with a red, pink wound bed without slough.  Wound Description (Comments):   Present on Admission:   Dressing Type Foam - Lift dressing to assess site every shift 07/10/21 2000    DVT prophylaxis:enoxaparin (LOVENOX) injection 40 mg Start: 07/02/21 2000 SCDs Start: 06/27/21 1518     Code Status: Full Code  Family Communication:: Discussed with mother on phone on 6/23  Patient status: Inpatient  Patient is from : Home  Anticipated discharge to: Inpatient rehab   Estimated DC date: Not sure   Consultants: PCCM, cardiology, cardiothoracic surgery, neurology, ID  Procedures: As above  Antimicrobials:  Anti-infectives (From  admission, onward)    Start     Dose/Rate Route Frequency Ordered Stop   06/30/21 1600  vancomycin (VANCOREADY) IVPB 1750 mg/350 mL  Status:  Discontinued        1,750 mg 175 mL/hr over 120 Minutes Intravenous Every 24 hours 06/29/21 1429 06/30/21 0746   06/30/21 1600  vancomycin (VANCOREADY) IVPB 1250 mg/250 mL  Status:  Discontinued        1,250 mg 166.7 mL/hr over 90 Minutes Intravenous Every 24 hours 06/30/21 0746 06/30/21 1351   06/30/21 1500  nafcillin 12 g in sodium chloride 0.9 % 500 mL continuous infusion        12 g 20.8 mL/hr over 24 Hours Intravenous Every 24 hours 06/30/21 1351     06/29/21 1515  vancomycin (VANCOREADY) IVPB 2000 mg/400 mL        2,000 mg 200 mL/hr over 120 Minutes Intravenous  Once 06/29/21 1416 06/29/21 1700   06/27/21 2215  vancomycin (VANCOCIN) IVPB 1000 mg/200 mL premix        1,000 mg 200 mL/hr over 60 Minutes Intravenous  Once 06/27/21 1517 06/27/21 2359   06/27/21 1615  ceFAZolin (ANCEF) IVPB 2g/100 mL premix        2 g 200 mL/hr over 30 Minutes Intravenous Every 8 hours 06/27/21 1517 06/29/21 0538   06/27/21 0400  vancomycin (VANCOCIN) 1,500 mg in sodium chloride 0.9 % 250 mL IVPB        1,500 mg 132.5 mL/hr over 120 Minutes Intravenous To Surgery 06/26/21 1345 06/27/21 1135   06/27/21 0400  ceFAZolin (ANCEF) IVPB 2g/100 mL premix        2 g 200 mL/hr over 30 Minutes Intravenous To Surgery 06/26/21 1345 06/27/21 0945   06/27/21  0400  ceFAZolin (ANCEF) IVPB 2g/100 mL premix        2 g 200 mL/hr over 30 Minutes Intravenous To Surgery 06/26/21 1345 06/27/21 1419   06/20/21 2200  vancomycin (VANCOREADY) IVPB 1250 mg/250 mL  Status:  Discontinued        1,250 mg 166.7 mL/hr over 90 Minutes Intravenous Every 24 hours 06/19/21 2203 06/19/21 2210   06/20/21 1800  nafcillin 12 g in sodium chloride 0.9 % 500 mL continuous infusion  Status:  Discontinued        12 g 20.8 mL/hr over 24 Hours Intravenous Every 24 hours 06/20/21 1005 06/29/21 1411    06/20/21 1000  vancomycin (VANCOREADY) IVPB 750 mg/150 mL  Status:  Discontinued        750 mg 150 mL/hr over 60 Minutes Intravenous Every 12 hours 06/19/21 2210 06/20/21 1005   06/20/21 0800  acyclovir (ZOVIRAX) 820 mg in dextrose 5 % 250 mL IVPB  Status:  Discontinued        10 mg/kg  81.9 kg 266.4 mL/hr over 60 Minutes Intravenous Every 12 hours 06/19/21 2207 06/20/21 1005   06/20/21 0600  cefTRIAXone (ROCEPHIN) 2 g in sodium chloride 0.9 % 100 mL IVPB  Status:  Discontinued        2 g 200 mL/hr over 30 Minutes Intravenous Every 12 hours 06/19/21 1825 06/20/21 1005   06/19/21 1830  acyclovir (ZOVIRAX) 820 mg in dextrose 5 % 250 mL IVPB        10 mg/kg  81.8 kg (Order-Specific) 266.4 mL/hr over 60 Minutes Intravenous STAT 06/19/21 1817 06/19/21 1956   06/19/21 1830  vancomycin (VANCOCIN) 1,750 mg in sodium chloride 0.9 % 500 mL IVPB        1,750 mg 258.8 mL/hr over 120 Minutes Intravenous STAT 06/19/21 1817 06/20/21 0000   06/19/21 1730  vancomycin (VANCOCIN) IVPB 1000 mg/200 mL premix  Status:  Discontinued        1,000 mg 200 mL/hr over 60 Minutes Intravenous STAT 06/19/21 1725 06/19/21 1817   06/19/21 1700  cefTRIAXone (ROCEPHIN) 2 g in sodium chloride 0.9 % 100 mL IVPB        2 g 200 mL/hr over 30 Minutes Intravenous  Once 06/19/21 1655 06/19/21 1826       Subjective: Patient seen and examined at the bedside this morning.  Hemodynamically stable, comfortable like yesterday.  No new issues.   Objective: Vitals:   07/11/21 0000 07/11/21 0435 07/11/21 0500 07/11/21 0700  BP: 106/63 110/75  113/78  Pulse: 90 94    Resp: 16 18    Temp: 100.1 F (37.8 C) 98.3 F (36.8 C)  98.3 F (36.8 C)  TempSrc: Axillary Oral  Oral  SpO2: 100% 100%  100%  Weight:   95.1 kg   Height:        Intake/Output Summary (Last 24 hours) at 07/11/2021 1141 Last data filed at 07/11/2021 0540 Gross per 24 hour  Intake 954.8 ml  Output 300 ml  Net 654.8 ml   Filed Weights   07/09/21 0348  07/10/21 0533 07/11/21 0500  Weight: 96 kg 97.5 kg 95.1 kg    Examination:   General exam: Overall comfortable, not in distress, lying in bed HEENT: PERRL Respiratory system:  no wheezes or crackles  Cardiovascular system: S1 & S2 heard, RRR.  Gastrointestinal system: Abdomen is nondistended, soft and nontender. Central nervous system: Alert and oriented, paraplegia Extremities: No edema, no clubbing ,no cyanosis Skin: No rashes, no ulcers,no icterus  Data Reviewed: I have personally reviewed following labs and imaging studies  CBC: Recent Labs  Lab 07/05/21 0210 07/08/21 0455 07/09/21 0515 07/09/21 1431 07/10/21 0505  WBC 15.2* 12.4* 10.5 13.4* 9.7  NEUTROABS  --   --   --  11.1*  --   HGB 8.6* 9.5* 8.6* 9.1* 8.2*  HCT 27.3* 29.6* 26.0* 28.3* 25.2*  MCV 89.2 86.8 87.8 87.3 87.8  PLT 425* 462* 469* 510* 096*   Basic Metabolic Panel: Recent Labs  Lab 07/08/21 0455 07/09/21 0515 07/10/21 0505 07/11/21 0400  NA 138 135 137 138  K 2.4* 3.1* 2.9* 3.2*  CL 101 102 106 106  CO2 26 26 27 27   GLUCOSE 104* 94 96 102*  BUN 6 7 5* 5*  CREATININE 0.76 0.75 0.80 0.79  CALCIUM 8.4* 8.2* 8.3* 8.3*  MG 1.7  --  1.9  --   PHOS 3.3  --   --   --      No results found for this or any previous visit (from the past 240 hour(s)).    Radiology Studies: No results found.  Scheduled Meds:  amiodarone  200 mg Oral BID   [START ON 07/12/2021] amiodarone  200 mg Oral Daily   aspirin  81 mg Oral Daily   benztropine  1 mg Oral QHS   bisacodyl  10 mg Rectal Daily   Chlorhexidine Gluconate Cloth  6 each Topical Daily   cloZAPine  50 mg Oral QHS   enoxaparin (LOVENOX) injection  40 mg Subcutaneous Q24H   fluPHENAZine  1 mg Oral QHS   insulin aspart  0-24 Units Subcutaneous Q6H   LORazepam  0.5 mg Oral Once   metoprolol tartrate  12.5 mg Oral BID   Or   metoprolol tartrate  12.5 mg Per Tube BID   multivitamin with minerals  1 tablet Oral Daily   mouth rinse  15 mL Mouth  Rinse 4 times per day   pantoprazole  40 mg Oral Daily   potassium chloride  40 mEq Oral BID   vitamin B-12  1,000 mcg Oral Daily   Continuous Infusions:  sodium chloride     feeding supplement (OSMOLITE 1.5 CAL) 55 mL/hr at 07/11/21 0540   nafcillin 12 g in sodium chloride 0.9 % 500 mL continuous infusion 20.8 mL/hr at 07/11/21 0540     LOS: 22 days   Shelly Coss, MD [facility] P6/30/2023, 11:41 AM

## 2021-07-11 NOTE — Discharge Instructions (Signed)

## 2021-07-12 DIAGNOSIS — R6520 Severe sepsis without septic shock: Secondary | ICD-10-CM | POA: Diagnosis not present

## 2021-07-12 DIAGNOSIS — G9341 Metabolic encephalopathy: Secondary | ICD-10-CM | POA: Diagnosis not present

## 2021-07-12 DIAGNOSIS — A4101 Sepsis due to Methicillin susceptible Staphylococcus aureus: Secondary | ICD-10-CM | POA: Diagnosis not present

## 2021-07-12 LAB — BASIC METABOLIC PANEL
Anion gap: 8 (ref 5–15)
BUN: 5 mg/dL — ABNORMAL LOW (ref 6–20)
CO2: 25 mmol/L (ref 22–32)
Calcium: 8.5 mg/dL — ABNORMAL LOW (ref 8.9–10.3)
Chloride: 105 mmol/L (ref 98–111)
Creatinine, Ser: 0.83 mg/dL (ref 0.44–1.00)
GFR, Estimated: >60 mL/min (ref >60)
Glucose, Bld: 100 mg/dL — ABNORMAL HIGH (ref 70–99)
Potassium: 3.7 mmol/L (ref 3.5–5.1)
Sodium: 138 mmol/L (ref 135–145)

## 2021-07-12 LAB — GLUCOSE, CAPILLARY
Glucose-Capillary: 100 mg/dL — ABNORMAL HIGH (ref 70–99)
Glucose-Capillary: 103 mg/dL — ABNORMAL HIGH (ref 70–99)
Glucose-Capillary: 89 mg/dL (ref 70–99)
Glucose-Capillary: 95 mg/dL (ref 70–99)

## 2021-07-12 MED ORDER — POTASSIUM CHLORIDE 20 MEQ PO PACK
40.0000 meq | PACK | Freq: Every day | ORAL | Status: DC
  Administered 2021-07-13 – 2021-07-17 (×5): 40 meq via ORAL
  Filled 2021-07-12 (×5): qty 2

## 2021-07-12 NOTE — Progress Notes (Signed)
PROGRESS NOTE  Debra Klein  ZWC:[id number] DOB: 1975-02-20 DOA: 06/19/2021 PCP: Gaynelle Arabian, MD   Brief Narrative: Patient is a 46 year old female with history of schizophrenia , hypertension, hyperlipidemia who initially presented with acute encephalopathy.  No report of eating or drinking for 2 weeks, not taking medications, having multiple falls and recently sustained fracture on the left ankle, placed on cam boot.  She was found on the floor.  On presentation she was found to have fever, leukocytosis, AKI, elevated LFT, hyperkalemia, lactic acidosis.  Patient was started on IV fluids, empiric antibiotics and was admitted.  Her encephalopathy continued to deteriorate and she was finally intubated and was transferred to [facility] service.  CT head showed insular hypodensity concerning for MCA infarct.  Hospital course also remarkable for finding of bacteremia,mitral valve endocarditis, status post mitral valve repair.  ID was following.  Prolonged hospitalization.  Patient transferred to [facility] service on 6/22.  Currently hemodynamically stable.  PT/OT recommending acute inpatient rehab on discharge ,TOC following.  Medically stable for dc when possible.  Important events  6/7: Fall --> fibula fracture, Minimally verbal at an outpatient visit with [facility]  6/8: Presented to the ED nonverbal with altered mentation. Progressively worsening mentation with requirement of intubation for airway protection.  Empirically treated for sepsis, presumed meningitis. MRI with large MCA infarct and punctate right hemisphere infarcts.  6/9: Blood cultures obtained on arrival positive for MSSA  6/10 MRI knee demonstrates septic arthritis.  6/13 L knee arthroscopy with I&D with snyovectomy for septic arthritis, TEE 6/14 steroids for no cuff leak 6/15 extubated, much more alert 6/16 mitral valve repair. Left chest tube placed for pleural effusion  6/17 too weak to come off vent. Not following commands.  6/18  extubated 6/19 required BiPAP due to decreased responsiveness overnight after getting antipsychotic meds    Assessment & Plan:  Principal Problem:   Sepsis ([place]) Active Problems:   Schizophrenia ([place])   Transaminitis   AKI (acute kidney injury) ([place])   Bacteremia due to methicillin susceptible Staphylococcus aureus (MSSA)   Toxic encephalopathy   Cerebrovascular accident (CVA) due to embolic occlusion of left middle cerebral artery (HCC)   Acute respiratory failure with hypoxia (HCC)   Septic arthritis of knee, left (HCC)   S/P mitral valve repair   Pressure injury of skin   Acute bacterial endocarditis  Sepsis/MSSA bacteremia/Staph epidermidis mitral valve endocarditis: ID was following.  Continue current antibiotics.  Still has leukocytosis but improving.  Afebrile.  Requires long-term antibiotic therapy.  Currently on nafcillin, plan to continue through 7/28. Has a picc line on right upper extremity  Staph epidermidis mitral valve endocarditis: Cardiothoracic surgery following, status post mitral valve repair.  Acute metabolic encephalopathy: Secondary to sepsis, stroke.  Mental status has significantly improved and is currently alert and oriented.  Still has some degree of dysarthria but significantly better.  left MCA stroke due to septic emboli: Neurology was following.  Continue to monitor mental status.  Has paraplegia.  Continue aspirin  Brief episode of A-fib: Cardiothoracic surgery started on amiodarone,being tapered.  Also on metoprolol.  Currently in normal sinus rhythm.  Acute respiratory failure: Secondary to left-sided atelectasis, left-sided pleural effusion.  Currently on room air.  Encourage incentive spirometry ,flutter valve  Dysphagia: Secondary to stroke.  She was on core track.  Speech therapy following, recommending dysphagia 1 diet, core track taken out.  Cardiogenic shock: Suspected to be from mitral valve regurgitation.  She was on IV diuresis.  She was  on inotropes, vasopressors, now discontinued.  Currently blood pressure stable.  Severe hypokalemia: Continue to monitor and supplement  Left knee septic arthritis: MRI of the left knee on 6/10 showed septic arthritis, peritubular osteomyelitis with subcu abscess.  Status post diagnostic arthrocentesis on 6/12.  Cultures showed MSSA.  Status post left knee arthroscopy, I&D, synovectomy.  Continue current antibiotics.  She needs outpatient follow-up with orthopedics  Normocytic anemia: Currently hemoglobin stable.  Diarrhea: Improved,rectal tube out  Prediabetes: Hemoglobin A1c of 6.1.  Currently on sliding scale insulin  History of schizophrenia: Continue fluphenazine, clozapine  Disposition: PT/OT recommended acute inpatient rehab.  TOC following     Nutrition Problem: Increased nutrient needs Etiology:  (endocarditis) Pressure Injury 06/27/21 Buttocks Posterior;Medial Stage 2 -  Partial thickness loss of dermis presenting as a shallow open injury with a red, pink wound bed without slough. (Active)  06/27/21 1900  Location: Buttocks  Location Orientation: Posterior;Medial  Staging: Stage 2 -  Partial thickness loss of dermis presenting as a shallow open injury with a red, pink wound bed without slough.  Wound Description (Comments):   Present on Admission:   Dressing Type Foam - Lift dressing to assess site every shift 07/12/21 0746    DVT prophylaxis:enoxaparin (LOVENOX) injection 40 mg Start: 07/02/21 2000 SCDs Start: 06/27/21 1518     Code Status: Full Code  Family Communication:: Discussed with mother on phone on 6/23  Patient status: Inpatient  Patient is from : Home  Anticipated discharge to: Inpatient rehab   Estimated DC date: Not sure   Consultants: PCCM, cardiology, cardiothoracic surgery, neurology, ID  Procedures: As above  Antimicrobials:  Anti-infectives (From admission, onward)    Start     Dose/Rate Route Frequency Ordered Stop   06/30/21 1600   vancomycin (VANCOREADY) IVPB 1750 mg/350 mL  Status:  Discontinued        1,750 mg 175 mL/hr over 120 Minutes Intravenous Every 24 hours 06/29/21 1429 06/30/21 0746   06/30/21 1600  vancomycin (VANCOREADY) IVPB 1250 mg/250 mL  Status:  Discontinued        1,250 mg 166.7 mL/hr over 90 Minutes Intravenous Every 24 hours 06/30/21 0746 06/30/21 1351   06/30/21 1500  nafcillin 12 g in sodium chloride 0.9 % 500 mL continuous infusion        12 g 20.8 mL/hr over 24 Hours Intravenous Every 24 hours 06/30/21 1351     06/29/21 1515  vancomycin (VANCOREADY) IVPB 2000 mg/400 mL        2,000 mg 200 mL/hr over 120 Minutes Intravenous  Once 06/29/21 1416 06/29/21 1700   06/27/21 2215  vancomycin (VANCOCIN) IVPB 1000 mg/200 mL premix        1,000 mg 200 mL/hr over 60 Minutes Intravenous  Once 06/27/21 1517 06/27/21 2359   06/27/21 1615  ceFAZolin (ANCEF) IVPB 2g/100 mL premix        2 g 200 mL/hr over 30 Minutes Intravenous Every 8 hours 06/27/21 1517 06/29/21 0538   06/27/21 0400  vancomycin (VANCOCIN) 1,500 mg in sodium chloride 0.9 % 250 mL IVPB        1,500 mg 132.5 mL/hr over 120 Minutes Intravenous To Surgery 06/26/21 1345 06/27/21 1135   06/27/21 0400  ceFAZolin (ANCEF) IVPB 2g/100 mL premix        2 g 200 mL/hr over 30 Minutes Intravenous To Surgery 06/26/21 1345 06/27/21 0945   06/27/21 0400  ceFAZolin (ANCEF) IVPB 2g/100 mL premix        2 g  200 mL/hr over 30 Minutes Intravenous To Surgery 06/26/21 1345 06/27/21 1419   06/20/21 2200  vancomycin (VANCOREADY) IVPB 1250 mg/250 mL  Status:  Discontinued        1,250 mg 166.7 mL/hr over 90 Minutes Intravenous Every 24 hours 06/19/21 2203 06/19/21 2210   06/20/21 1800  nafcillin 12 g in sodium chloride 0.9 % 500 mL continuous infusion  Status:  Discontinued        12 g 20.8 mL/hr over 24 Hours Intravenous Every 24 hours 06/20/21 1005 06/29/21 1411   06/20/21 1000  vancomycin (VANCOREADY) IVPB 750 mg/150 mL  Status:  Discontinued        750  mg 150 mL/hr over 60 Minutes Intravenous Every 12 hours 06/19/21 2210 06/20/21 1005   06/20/21 0800  acyclovir (ZOVIRAX) 820 mg in dextrose 5 % 250 mL IVPB  Status:  Discontinued        10 mg/kg  81.9 kg 266.4 mL/hr over 60 Minutes Intravenous Every 12 hours 06/19/21 2207 06/20/21 1005   06/20/21 0600  cefTRIAXone (ROCEPHIN) 2 g in sodium chloride 0.9 % 100 mL IVPB  Status:  Discontinued        2 g 200 mL/hr over 30 Minutes Intravenous Every 12 hours 06/19/21 1825 06/20/21 1005   06/19/21 1830  acyclovir (ZOVIRAX) 820 mg in dextrose 5 % 250 mL IVPB        10 mg/kg  81.8 kg (Order-Specific) 266.4 mL/hr over 60 Minutes Intravenous STAT 06/19/21 1817 06/19/21 1956   06/19/21 1830  vancomycin (VANCOCIN) 1,750 mg in sodium chloride 0.9 % 500 mL IVPB        1,750 mg 258.8 mL/hr over 120 Minutes Intravenous STAT 06/19/21 1817 06/20/21 0000   06/19/21 1730  vancomycin (VANCOCIN) IVPB 1000 mg/200 mL premix  Status:  Discontinued        1,000 mg 200 mL/hr over 60 Minutes Intravenous STAT 06/19/21 1725 06/19/21 1817   06/19/21 1700  cefTRIAXone (ROCEPHIN) 2 g in sodium chloride 0.9 % 100 mL IVPB        2 g 200 mL/hr over 30 Minutes Intravenous  Once 06/19/21 1655 06/19/21 1826       Subjective: Patient seen and examined at bedside this morning.  Sleeping comfortably.  No active issues  Objective: Vitals:   07/12/21 0045 07/12/21 0529 07/12/21 0624 07/12/21 0801  BP: 111/76 122/84  131/76  Pulse: 82 81  89  Resp: 18 18  17   Temp: 97.9 F (36.6 C) 98.1 F (36.7 C)  98 F (36.7 C)  TempSrc: Oral Oral  Axillary  SpO2: 99% 100%  100%  Weight:   95 kg   Height:        Intake/Output Summary (Last 24 hours) at 07/12/2021 1129 Last data filed at 07/12/2021 0838 Gross per 24 hour  Intake 790 ml  Output --  Net 790 ml   Filed Weights   07/10/21 0533 07/11/21 0500 07/12/21 0624  Weight: 97.5 kg 95.1 kg 95 kg    Examination:   General exam: Overall comfortable, not in distress, lying  in bed,sleeping HEENT: PERRL Respiratory system:  no wheezes or crackles  Cardiovascular system: S1 & S2 heard, RRR.  Gastrointestinal system: Abdomen is nondistended, soft and nontender. Central nervous system:sleeping Extremities: Trace bilateral lower extremity edema, no clubbing ,no cyanosis Skin: No rashes, no ulcers,no icterus      Data Reviewed: I have personally reviewed following labs and imaging studies  CBC: Recent Labs  Lab 07/08/21 0455  07/09/21 0515 07/09/21 1431 07/10/21 0505  WBC 12.4* 10.5 13.4* 9.7  NEUTROABS  --   --  11.1*  --   HGB 9.5* 8.6* 9.1* 8.2*  HCT 29.6* 26.0* 28.3* 25.2*  MCV 86.8 87.8 87.3 87.8  PLT 462* 469* 510* 881*   Basic Metabolic Panel: Recent Labs  Lab 07/08/21 0455 07/09/21 0515 07/10/21 0505 07/11/21 0400 07/12/21 0500  NA 138 135 137 138 138  K 2.4* 3.1* 2.9* 3.2* 3.7  CL 101 102 106 106 105  CO2 26 26 27 27 25   GLUCOSE 104* 94 96 102* 100*  BUN 6 7 5* 5* 5*  CREATININE 0.76 0.75 0.80 0.79 0.83  CALCIUM 8.4* 8.2* 8.3* 8.3* 8.5*  MG 1.7  --  1.9  --   --   PHOS 3.3  --   --   --   --      No results found for this or any previous visit (from the past 240 hour(s)).    Radiology Studies: No results found.  Scheduled Meds:  amiodarone  200 mg Oral Daily   aspirin  81 mg Oral Daily   benztropine  1 mg Oral QHS   Chlorhexidine Gluconate Cloth  6 each Topical Daily   cloZAPine  50 mg Oral QHS   enoxaparin (LOVENOX) injection  40 mg Subcutaneous Q24H   fluPHENAZine  1 mg Oral QHS   insulin aspart  0-24 Units Subcutaneous Q6H   LORazepam  0.5 mg Oral Once   metoprolol tartrate  12.5 mg Oral BID   Or   metoprolol tartrate  12.5 mg Per Tube BID   multivitamin with minerals  1 tablet Oral Daily   mouth rinse  15 mL Mouth Rinse 4 times per day   pantoprazole  40 mg Oral Daily   [START ON 07/13/2021] potassium chloride  40 mEq Oral Daily   vitamin B-12  1,000 mcg Oral Daily   Continuous Infusions:  sodium chloride      feeding supplement (OSMOLITE 1.5 CAL) 55 mL/hr at 07/11/21 0540   nafcillin 12 g in sodium chloride 0.9 % 500 mL continuous infusion 12 g (07/11/21 1555)     LOS: 23 days   Shelly Coss, MD [facility] P7/01/2021, 11:29 AM

## 2021-07-13 DIAGNOSIS — A4101 Sepsis due to Methicillin susceptible Staphylococcus aureus: Secondary | ICD-10-CM | POA: Diagnosis not present

## 2021-07-13 DIAGNOSIS — R6520 Severe sepsis without septic shock: Secondary | ICD-10-CM | POA: Diagnosis not present

## 2021-07-13 DIAGNOSIS — G9341 Metabolic encephalopathy: Secondary | ICD-10-CM | POA: Diagnosis not present

## 2021-07-13 LAB — GLUCOSE, CAPILLARY
Glucose-Capillary: 104 mg/dL — ABNORMAL HIGH (ref 70–99)
Glucose-Capillary: 105 mg/dL — ABNORMAL HIGH (ref 70–99)
Glucose-Capillary: 114 mg/dL — ABNORMAL HIGH (ref 70–99)
Glucose-Capillary: 99 mg/dL (ref 70–99)

## 2021-07-13 NOTE — Progress Notes (Signed)
PROGRESS NOTE  Debra Klein  BWG:[id number] DOB: January 28, 1975 DOA: 06/19/2021 PCP: Gaynelle Arabian, MD   Brief Narrative: Patient is a 46 year old female with history of schizophrenia , hypertension, hyperlipidemia who initially presented with acute encephalopathy.  No report of eating or drinking for 2 weeks, not taking medications, having multiple falls and recently sustained fracture on the left ankle, placed on cam boot.  She was found on the floor.  On presentation she was found to have fever, leukocytosis, AKI, elevated LFT, hyperkalemia, lactic acidosis.  Patient was started on IV fluids, empiric antibiotics and was admitted.  Her encephalopathy continued to deteriorate and she was finally intubated and was transferred to [facility] service.  CT head showed insular hypodensity concerning for MCA infarct.  Hospital course also remarkable for finding of bacteremia,mitral valve endocarditis, status post mitral valve repair.  ID was following.  Prolonged hospitalization.  Patient transferred to [facility] service on 6/22.  Currently hemodynamically stable.  PT/OT recommending acute inpatient rehab on discharge ,TOC following.  Medically stable for dc when possible.  Important events  6/7: Fall --> fibula fracture, Minimally verbal at an outpatient visit with [facility]  6/8: Presented to the ED nonverbal with altered mentation. Progressively worsening mentation with requirement of intubation for airway protection.  Empirically treated for sepsis, presumed meningitis. MRI with large MCA infarct and punctate right hemisphere infarcts.  6/9: Blood cultures obtained on arrival positive for MSSA  6/10 MRI knee demonstrates septic arthritis.  6/13 L knee arthroscopy with I&D with snyovectomy for septic arthritis, TEE 6/14 steroids for no cuff leak 6/15 extubated, much more alert 6/16 mitral valve repair. Left chest tube placed for pleural effusion  6/17 too weak to come off vent. Not following commands.  6/18  extubated 6/19 required BiPAP due to decreased responsiveness overnight after getting antipsychotic meds    Assessment & Plan:  Principal Problem:   Sepsis ([place]) Active Problems:   Schizophrenia ([place])   Transaminitis   AKI (acute kidney injury) ([place])   Bacteremia due to methicillin susceptible Staphylococcus aureus (MSSA)   Toxic encephalopathy   Cerebrovascular accident (CVA) due to embolic occlusion of left middle cerebral artery (HCC)   Acute respiratory failure with hypoxia (HCC)   Septic arthritis of knee, left (HCC)   S/P mitral valve repair   Pressure injury of skin   Acute bacterial endocarditis  Sepsis/MSSA bacteremia/Staph epidermidis mitral valve endocarditis: ID was following.  Continue current antibiotics.  Still has leukocytosis but improving.  Afebrile.  Requires long-term antibiotic therapy.  Currently on nafcillin, plan to continue through 7/28. Has a picc line on right upper extremity  Staph epidermidis mitral valve endocarditis: Cardiothoracic surgery following, status post mitral valve repair.  Acute metabolic encephalopathy: Secondary to sepsis, stroke.  Mental status has significantly improved and is currently alert and oriented.  Still has some degree of dysarthria but significantly better.  left MCA stroke due to septic emboli: Neurology was following.  Continue to monitor mental status.  Has paraplegia.  Continue aspirin  Brief episode of A-fib: Cardiothoracic surgery started on amiodarone,being tapered.  Also on metoprolol.  Currently in normal sinus rhythm.  Acute respiratory failure: Secondary to left-sided atelectasis, left-sided pleural effusion.  Currently on room air.  Encourage incentive spirometry ,flutter valve  Dysphagia: Secondary to stroke.  She was on core track.  Speech therapy following, recommending dysphagia 1 diet, core track taken out.  Cardiogenic shock: Suspected to be from mitral valve regurgitation.  She was on IV diuresis.  She was  on inotropes, vasopressors, now discontinued.  Currently blood pressure stable.  Severe hypokalemia: Continue to monitor and supplement  Left knee septic arthritis: MRI of the left knee on 6/10 showed septic arthritis, peritubular osteomyelitis with subcu abscess.  Status post diagnostic arthrocentesis on 6/12.  Cultures showed MSSA.  Status post left knee arthroscopy, I&D, synovectomy.  Continue current antibiotics.  She needs outpatient follow-up with orthopedics  Normocytic anemia: Currently hemoglobin stable.  Diarrhea: Improved,rectal tube out  Prediabetes: Hemoglobin A1c of 6.1.  Currently on sliding scale insulin  History of schizophrenia: Continue fluphenazine, clozapine  Disposition: PT/OT recommended acute inpatient rehab.  TOC following     Nutrition Problem: Increased nutrient needs Etiology:  (endocarditis) Pressure Injury 06/27/21 Buttocks Posterior;Medial Stage 2 -  Partial thickness loss of dermis presenting as a shallow open injury with a red, pink wound bed without slough. (Active)  06/27/21 1900  Location: Buttocks  Location Orientation: Posterior;Medial  Staging: Stage 2 -  Partial thickness loss of dermis presenting as a shallow open injury with a red, pink wound bed without slough.  Wound Description (Comments):   Present on Admission:   Dressing Type Foam - Lift dressing to assess site every shift 07/12/21 2000    DVT prophylaxis:enoxaparin (LOVENOX) injection 40 mg Start: 07/02/21 2000 SCDs Start: 06/27/21 1518     Code Status: Full Code  Family Communication:: Discussed with mother on phone on 6/23  Patient status: Inpatient  Patient is from : Home  Anticipated discharge to: Inpatient rehab   Estimated DC date: Not sure   Consultants: PCCM, cardiology, cardiothoracic surgery, neurology, ID  Procedures: As above  Antimicrobials:  Anti-infectives (From admission, onward)    Start     Dose/Rate Route Frequency Ordered Stop   06/30/21 1600   vancomycin (VANCOREADY) IVPB 1750 mg/350 mL  Status:  Discontinued        1,750 mg 175 mL/hr over 120 Minutes Intravenous Every 24 hours 06/29/21 1429 06/30/21 0746   06/30/21 1600  vancomycin (VANCOREADY) IVPB 1250 mg/250 mL  Status:  Discontinued        1,250 mg 166.7 mL/hr over 90 Minutes Intravenous Every 24 hours 06/30/21 0746 06/30/21 1351   06/30/21 1500  nafcillin 12 g in sodium chloride 0.9 % 500 mL continuous infusion        12 g 20.8 mL/hr over 24 Hours Intravenous Every 24 hours 06/30/21 1351     06/29/21 1515  vancomycin (VANCOREADY) IVPB 2000 mg/400 mL        2,000 mg 200 mL/hr over 120 Minutes Intravenous  Once 06/29/21 1416 06/29/21 1700   06/27/21 2215  vancomycin (VANCOCIN) IVPB 1000 mg/200 mL premix        1,000 mg 200 mL/hr over 60 Minutes Intravenous  Once 06/27/21 1517 06/27/21 2359   06/27/21 1615  ceFAZolin (ANCEF) IVPB 2g/100 mL premix        2 g 200 mL/hr over 30 Minutes Intravenous Every 8 hours 06/27/21 1517 06/29/21 0538   06/27/21 0400  vancomycin (VANCOCIN) 1,500 mg in sodium chloride 0.9 % 250 mL IVPB        1,500 mg 132.5 mL/hr over 120 Minutes Intravenous To Surgery 06/26/21 1345 06/27/21 1135   06/27/21 0400  ceFAZolin (ANCEF) IVPB 2g/100 mL premix        2 g 200 mL/hr over 30 Minutes Intravenous To Surgery 06/26/21 1345 06/27/21 0945   06/27/21 0400  ceFAZolin (ANCEF) IVPB 2g/100 mL premix        2 g  200 mL/hr over 30 Minutes Intravenous To Surgery 06/26/21 1345 06/27/21 1419   06/20/21 2200  vancomycin (VANCOREADY) IVPB 1250 mg/250 mL  Status:  Discontinued        1,250 mg 166.7 mL/hr over 90 Minutes Intravenous Every 24 hours 06/19/21 2203 06/19/21 2210   06/20/21 1800  nafcillin 12 g in sodium chloride 0.9 % 500 mL continuous infusion  Status:  Discontinued        12 g 20.8 mL/hr over 24 Hours Intravenous Every 24 hours 06/20/21 1005 06/29/21 1411   06/20/21 1000  vancomycin (VANCOREADY) IVPB 750 mg/150 mL  Status:  Discontinued        750  mg 150 mL/hr over 60 Minutes Intravenous Every 12 hours 06/19/21 2210 06/20/21 1005   06/20/21 0800  acyclovir (ZOVIRAX) 820 mg in dextrose 5 % 250 mL IVPB  Status:  Discontinued        10 mg/kg  81.9 kg 266.4 mL/hr over 60 Minutes Intravenous Every 12 hours 06/19/21 2207 06/20/21 1005   06/20/21 0600  cefTRIAXone (ROCEPHIN) 2 g in sodium chloride 0.9 % 100 mL IVPB  Status:  Discontinued        2 g 200 mL/hr over 30 Minutes Intravenous Every 12 hours 06/19/21 1825 06/20/21 1005   06/19/21 1830  acyclovir (ZOVIRAX) 820 mg in dextrose 5 % 250 mL IVPB        10 mg/kg  81.8 kg (Order-Specific) 266.4 mL/hr over 60 Minutes Intravenous STAT 06/19/21 1817 06/19/21 1956   06/19/21 1830  vancomycin (VANCOCIN) 1,750 mg in sodium chloride 0.9 % 500 mL IVPB        1,750 mg 258.8 mL/hr over 120 Minutes Intravenous STAT 06/19/21 1817 06/20/21 0000   06/19/21 1730  vancomycin (VANCOCIN) IVPB 1000 mg/200 mL premix  Status:  Discontinued        1,000 mg 200 mL/hr over 60 Minutes Intravenous STAT 06/19/21 1725 06/19/21 1817   06/19/21 1700  cefTRIAXone (ROCEPHIN) 2 g in sodium chloride 0.9 % 100 mL IVPB        2 g 200 mL/hr over 30 Minutes Intravenous  Once 06/19/21 1655 06/19/21 1826       Subjective: Patient seen and examined the bedside this morning.  Hemodynamically stable.  Comfortable without any complaints today.  Asking for water  Objective: Vitals:   07/12/21 1731 07/12/21 1943 07/13/21 0020 07/13/21 0353  BP: 100/79 131/74 111/81 116/78  Pulse: 93 86 86 84  Resp: 18 18 19 18   Temp: 98.9 F (37.2 C) 99.1 F (37.3 C) 98.2 F (36.8 C) 98 F (36.7 C)  TempSrc: Axillary Oral Oral Oral  SpO2: 95% 99% 98% 96%  Weight:      Height:        Intake/Output Summary (Last 24 hours) at 07/13/2021 1111 Last data filed at 07/13/2021 0354 Gross per 24 hour  Intake 200 ml  Output 1550 ml  Net -1350 ml   Filed Weights   07/10/21 0533 07/11/21 0500 07/12/21 0624  Weight: 97.5 kg 95.1 kg 95 kg     Examination:   General exam: Overall comfortable, not in distress HEENT: Proptosis Respiratory system:  no wheezes or crackles  Cardiovascular system: S1 & S2 heard, RRR.  Gastrointestinal system: Abdomen is nondistended, soft and nontender. Central nervous system: Alert and oriented, paraplegia Extremities: No edema, no clubbing ,no cyanosis Skin: No rashes, no ulcers,no icterus     Data Reviewed: I have personally reviewed following labs and imaging studies  CBC:  Recent Labs  Lab 07/08/21 0455 07/09/21 0515 07/09/21 1431 07/10/21 0505  WBC 12.4* 10.5 13.4* 9.7  NEUTROABS  --   --  11.1*  --   HGB 9.5* 8.6* 9.1* 8.2*  HCT 29.6* 26.0* 28.3* 25.2*  MCV 86.8 87.8 87.3 87.8  PLT 462* 469* 510* 026*   Basic Metabolic Panel: Recent Labs  Lab 07/08/21 0455 07/09/21 0515 07/10/21 0505 07/11/21 0400 07/12/21 0500  NA 138 135 137 138 138  K 2.4* 3.1* 2.9* 3.2* 3.7  CL 101 102 106 106 105  CO2 26 26 27 27 25   GLUCOSE 104* 94 96 102* 100*  BUN 6 7 5* 5* 5*  CREATININE 0.76 0.75 0.80 0.79 0.83  CALCIUM 8.4* 8.2* 8.3* 8.3* 8.5*  MG 1.7  --  1.9  --   --   PHOS 3.3  --   --   --   --      No results found for this or any previous visit (from the past 240 hour(s)).    Radiology Studies: No results found.  Scheduled Meds:  amiodarone  200 mg Oral Daily   aspirin  81 mg Oral Daily   benztropine  1 mg Oral QHS   Chlorhexidine Gluconate Cloth  6 each Topical Daily   cloZAPine  50 mg Oral QHS   enoxaparin (LOVENOX) injection  40 mg Subcutaneous Q24H   fluPHENAZine  1 mg Oral QHS   insulin aspart  0-24 Units Subcutaneous Q6H   LORazepam  0.5 mg Oral Once   metoprolol tartrate  12.5 mg Oral BID   Or   metoprolol tartrate  12.5 mg Per Tube BID   multivitamin with minerals  1 tablet Oral Daily   mouth rinse  15 mL Mouth Rinse 4 times per day   pantoprazole  40 mg Oral Daily   potassium chloride  40 mEq Oral Daily   vitamin B-12  1,000 mcg Oral Daily    Continuous Infusions:  sodium chloride     feeding supplement (OSMOLITE 1.5 CAL) 55 mL/hr at 07/11/21 0540   nafcillin 12 g in sodium chloride 0.9 % 500 mL continuous infusion 12 g (07/12/21 1635)     LOS: 24 days   Shelly Coss, MD [facility] P7/02/2021, 11:11 AM

## 2021-07-14 DIAGNOSIS — A4101 Sepsis due to Methicillin susceptible Staphylococcus aureus: Secondary | ICD-10-CM | POA: Diagnosis not present

## 2021-07-14 DIAGNOSIS — G9341 Metabolic encephalopathy: Secondary | ICD-10-CM | POA: Diagnosis not present

## 2021-07-14 DIAGNOSIS — R6520 Severe sepsis without septic shock: Secondary | ICD-10-CM | POA: Diagnosis not present

## 2021-07-14 LAB — GLUCOSE, CAPILLARY
Glucose-Capillary: 109 mg/dL — ABNORMAL HIGH (ref 70–99)
Glucose-Capillary: 94 mg/dL (ref 70–99)
Glucose-Capillary: 95 mg/dL (ref 70–99)
Glucose-Capillary: 97 mg/dL (ref 70–99)

## 2021-07-14 NOTE — Progress Notes (Signed)
PROGRESS NOTE  Debra Klein  QMG:[id number] DOB: 11-Jan-1976 DOA: 06/19/2021 PCP: Gaynelle Arabian, MD   Brief Narrative: Patient is a 46 year old female with history of schizophrenia , hypertension, hyperlipidemia who initially presented with acute encephalopathy.  No report of eating or drinking for 2 weeks, not taking medications, having multiple falls and recently sustained fracture on the left ankle, placed on cam boot.  She was found on the floor.  On presentation she was found to have fever, leukocytosis, AKI, elevated LFT, hyperkalemia, lactic acidosis.  Patient was started on IV fluids, empiric antibiotics and was admitted.  Her encephalopathy continued to deteriorate and she was finally intubated and was transferred to [facility] service.  CT head showed insular hypodensity concerning for MCA infarct.  Hospital course also remarkable for finding of bacteremia,mitral valve endocarditis, status post mitral valve repair.  ID was following.  Prolonged hospitalization.  Patient transferred to [facility] service on 6/22.  Currently hemodynamically stable.  PT/OT recommending acute inpatient rehab on discharge ,TOC following.  Medically stable for dc when possible.  Important events  6/7: Fall --> fibula fracture, Minimally verbal at an outpatient visit with [facility]  6/8: Presented to the ED nonverbal with altered mentation. Progressively worsening mentation with requirement of intubation for airway protection.  Empirically treated for sepsis, presumed meningitis. MRI with large MCA infarct and punctate right hemisphere infarcts.  6/9: Blood cultures obtained on arrival positive for MSSA  6/10 MRI knee demonstrates septic arthritis.  6/13 L knee arthroscopy with I&D with snyovectomy for septic arthritis, TEE 6/14 steroids for no cuff leak 6/15 extubated, much more alert 6/16 mitral valve repair. Left chest tube placed for pleural effusion  6/17 too weak to come off vent. Not following commands.  6/18  extubated 6/19 required BiPAP due to decreased responsiveness overnight after getting antipsychotic meds    Assessment & Plan:  Principal Problem:   Sepsis ([place]) Active Problems:   Schizophrenia ([place])   Transaminitis   AKI (acute kidney injury) ([place])   Bacteremia due to methicillin susceptible Staphylococcus aureus (MSSA)   Toxic encephalopathy   Cerebrovascular accident (CVA) due to embolic occlusion of left middle cerebral artery (HCC)   Acute respiratory failure with hypoxia (HCC)   Septic arthritis of knee, left (HCC)   S/P mitral valve repair   Pressure injury of skin   Acute bacterial endocarditis  Sepsis/MSSA bacteremia/Staph epidermidis mitral valve endocarditis: ID was following.  Continue current antibiotics.  Still has leukocytosis but improving.  Afebrile.  Requires long-term antibiotic therapy.  Currently on nafcillin, plan to continue through 7/28. Has a picc line on right upper extremity  Staph epidermidis mitral valve endocarditis: Cardiothoracic surgery following, status post mitral valve repair.  Acute metabolic encephalopathy: Secondary to sepsis, stroke.  Mental status has significantly improved and is currently alert and oriented.  Still has some degree of dysarthria but significantly better.  left MCA stroke due to septic emboli: Neurology was following.  Continue to monitor mental status.  Has paraplegia.  Continue aspirin  Brief episode of A-fib: Cardiothoracic surgery started on amiodarone,being tapered.  Also on metoprolol.  Currently in normal sinus rhythm.  Acute respiratory failure: Secondary to left-sided atelectasis, left-sided pleural effusion.  Currently on room air.  Encourage incentive spirometry ,flutter valve  Dysphagia: Secondary to stroke.  She was on core track.  Speech therapy following, recommending dysphagia 1 diet, core track taken out.  Cardiogenic shock: Suspected to be from mitral valve regurgitation.  She was on IV diuresis.  She was  on inotropes, vasopressors, now discontinued.  Currently blood pressure stable.  Severe hypokalemia: Continue to monitor and supplement  Left knee septic arthritis: MRI of the left knee on 6/10 showed septic arthritis, peritubular osteomyelitis with subcu abscess.  Status post diagnostic arthrocentesis on 6/12.  Cultures showed MSSA.  Status post left knee arthroscopy, I&D, synovectomy.  Continue current antibiotics.  She needs outpatient follow-up with orthopedics  Normocytic anemia: Currently hemoglobin stable.  Diarrhea: Improved,rectal tube out  Prediabetes: Hemoglobin A1c of 6.1.  Currently on sliding scale insulin  History of schizophrenia: Continue fluphenazine, clozapine  Disposition: PT/OT recommended acute inpatient rehab.  TOC following     Nutrition Problem: Increased nutrient needs Etiology:  (endocarditis) Pressure Injury 06/27/21 Buttocks Posterior;Medial Stage 2 -  Partial thickness loss of dermis presenting as a shallow open injury with a red, pink wound bed without slough. (Active)  06/27/21 1900  Location: Buttocks  Location Orientation: Posterior;Medial  Staging: Stage 2 -  Partial thickness loss of dermis presenting as a shallow open injury with a red, pink wound bed without slough.  Wound Description (Comments):   Present on Admission:   Dressing Type Foam - Lift dressing to assess site every shift 07/14/21 0739    DVT prophylaxis:enoxaparin (LOVENOX) injection 40 mg Start: 07/02/21 2000 SCDs Start: 06/27/21 1518     Code Status: Full Code  Family Communication:: Discussed with mother on phone on 6/23  Patient status: Inpatient  Patient is from : Home  Anticipated discharge to: Inpatient rehab   Estimated DC date: Not sure,waiting for bed   Consultants: PCCM, cardiology, cardiothoracic surgery, neurology, ID  Procedures: As above  Antimicrobials:  Anti-infectives (From admission, onward)    Start     Dose/Rate Route Frequency Ordered Stop    06/30/21 1600  vancomycin (VANCOREADY) IVPB 1750 mg/350 mL  Status:  Discontinued        1,750 mg 175 mL/hr over 120 Minutes Intravenous Every 24 hours 06/29/21 1429 06/30/21 0746   06/30/21 1600  vancomycin (VANCOREADY) IVPB 1250 mg/250 mL  Status:  Discontinued        1,250 mg 166.7 mL/hr over 90 Minutes Intravenous Every 24 hours 06/30/21 0746 06/30/21 1351   06/30/21 1500  nafcillin 12 g in sodium chloride 0.9 % 500 mL continuous infusion        12 g 20.8 mL/hr over 24 Hours Intravenous Every 24 hours 06/30/21 1351     06/29/21 1515  vancomycin (VANCOREADY) IVPB 2000 mg/400 mL        2,000 mg 200 mL/hr over 120 Minutes Intravenous  Once 06/29/21 1416 06/29/21 1700   06/27/21 2215  vancomycin (VANCOCIN) IVPB 1000 mg/200 mL premix        1,000 mg 200 mL/hr over 60 Minutes Intravenous  Once 06/27/21 1517 06/27/21 2359   06/27/21 1615  ceFAZolin (ANCEF) IVPB 2g/100 mL premix        2 g 200 mL/hr over 30 Minutes Intravenous Every 8 hours 06/27/21 1517 06/29/21 0538   06/27/21 0400  vancomycin (VANCOCIN) 1,500 mg in sodium chloride 0.9 % 250 mL IVPB        1,500 mg 132.5 mL/hr over 120 Minutes Intravenous To Surgery 06/26/21 1345 06/27/21 1135   06/27/21 0400  ceFAZolin (ANCEF) IVPB 2g/100 mL premix        2 g 200 mL/hr over 30 Minutes Intravenous To Surgery 06/26/21 1345 06/27/21 0945   06/27/21 0400  ceFAZolin (ANCEF) IVPB 2g/100 mL premix  2 g 200 mL/hr over 30 Minutes Intravenous To Surgery 06/26/21 1345 06/27/21 1419   06/20/21 2200  vancomycin (VANCOREADY) IVPB 1250 mg/250 mL  Status:  Discontinued        1,250 mg 166.7 mL/hr over 90 Minutes Intravenous Every 24 hours 06/19/21 2203 06/19/21 2210   06/20/21 1800  nafcillin 12 g in sodium chloride 0.9 % 500 mL continuous infusion  Status:  Discontinued        12 g 20.8 mL/hr over 24 Hours Intravenous Every 24 hours 06/20/21 1005 06/29/21 1411   06/20/21 1000  vancomycin (VANCOREADY) IVPB 750 mg/150 mL  Status:   Discontinued        750 mg 150 mL/hr over 60 Minutes Intravenous Every 12 hours 06/19/21 2210 06/20/21 1005   06/20/21 0800  acyclovir (ZOVIRAX) 820 mg in dextrose 5 % 250 mL IVPB  Status:  Discontinued        10 mg/kg  81.9 kg 266.4 mL/hr over 60 Minutes Intravenous Every 12 hours 06/19/21 2207 06/20/21 1005   06/20/21 0600  cefTRIAXone (ROCEPHIN) 2 g in sodium chloride 0.9 % 100 mL IVPB  Status:  Discontinued        2 g 200 mL/hr over 30 Minutes Intravenous Every 12 hours 06/19/21 1825 06/20/21 1005   06/19/21 1830  acyclovir (ZOVIRAX) 820 mg in dextrose 5 % 250 mL IVPB        10 mg/kg  81.8 kg (Order-Specific) 266.4 mL/hr over 60 Minutes Intravenous STAT 06/19/21 1817 06/19/21 1956   06/19/21 1830  vancomycin (VANCOCIN) 1,750 mg in sodium chloride 0.9 % 500 mL IVPB        1,750 mg 258.8 mL/hr over 120 Minutes Intravenous STAT 06/19/21 1817 06/20/21 0000   06/19/21 1730  vancomycin (VANCOCIN) IVPB 1000 mg/200 mL premix  Status:  Discontinued        1,000 mg 200 mL/hr over 60 Minutes Intravenous STAT 06/19/21 1725 06/19/21 1817   06/19/21 1700  cefTRIAXone (ROCEPHIN) 2 g in sodium chloride 0.9 % 100 mL IVPB        2 g 200 mL/hr over 30 Minutes Intravenous  Once 06/19/21 1655 06/19/21 1826       Subjective: Seen and examined at bedside. Working with PT/Ot,no new changes  Objective: Vitals:   07/14/21 0406 07/14/21 0655 07/14/21 0740 07/14/21 1211  BP: 124/83  131/86 134/83  Pulse: 99  92 86  Resp: 18  18 18   Temp: 99.5 F (37.5 C)  98.2 F (36.8 C) 98.2 F (36.8 C)  TempSrc: Axillary  Oral Oral  SpO2: 99%  100% 100%  Weight:  95.4 kg    Height:        Intake/Output Summary (Last 24 hours) at 07/14/2021 1337 Last data filed at 07/14/2021 0402 Gross per 24 hour  Intake 1575.96 ml  Output 700 ml  Net 875.96 ml   Filed Weights   07/11/21 0500 07/12/21 0624 07/14/21 0655  Weight: 95.1 kg 95 kg 95.4 kg    Examination:  General exam: Overall comfortable, not in  distress,very weak,deconditioned HEENT: PERRL Respiratory system:  no wheezes or crackles  Cardiovascular system: S1 & S2 heard, RRR.  Gastrointestinal system: Abdomen is nondistended, soft and nontender. Central nervous system: Alert and oriented,paraplegia Extremities: No edema, no clubbing ,no cyanosis Skin: No rashes, no ulcers,no icterus     Data Reviewed: I have personally reviewed following labs and imaging studies  CBC: Recent Labs  Lab 07/08/21 0455 07/09/21 0515 07/09/21 1431 07/10/21 0505  WBC 12.4* 10.5 13.4* 9.7  NEUTROABS  --   --  11.1*  --   HGB 9.5* 8.6* 9.1* 8.2*  HCT 29.6* 26.0* 28.3* 25.2*  MCV 86.8 87.8 87.3 87.8  PLT 462* 469* 510* 163*   Basic Metabolic Panel: Recent Labs  Lab 07/08/21 0455 07/09/21 0515 07/10/21 0505 07/11/21 0400 07/12/21 0500  NA 138 135 137 138 138  K 2.4* 3.1* 2.9* 3.2* 3.7  CL 101 102 106 106 105  CO2 26 26 27 27 25   GLUCOSE 104* 94 96 102* 100*  BUN 6 7 5* 5* 5*  CREATININE 0.76 0.75 0.80 0.79 0.83  CALCIUM 8.4* 8.2* 8.3* 8.3* 8.5*  MG 1.7  --  1.9  --   --   PHOS 3.3  --   --   --   --      No results found for this or any previous visit (from the past 240 hour(s)).    Radiology Studies: No results found.  Scheduled Meds:  amiodarone  200 mg Oral Daily   aspirin  81 mg Oral Daily   benztropine  1 mg Oral QHS   Chlorhexidine Gluconate Cloth  6 each Topical Daily   cloZAPine  50 mg Oral QHS   enoxaparin (LOVENOX) injection  40 mg Subcutaneous Q24H   fluPHENAZine  1 mg Oral QHS   insulin aspart  0-24 Units Subcutaneous Q6H   LORazepam  0.5 mg Oral Once   metoprolol tartrate  12.5 mg Oral BID   Or   metoprolol tartrate  12.5 mg Per Tube BID   multivitamin with minerals  1 tablet Oral Daily   mouth rinse  15 mL Mouth Rinse 4 times per day   pantoprazole  40 mg Oral Daily   potassium chloride  40 mEq Oral Daily   vitamin B-12  1,000 mcg Oral Daily   Continuous Infusions:  sodium chloride     feeding  supplement (OSMOLITE 1.5 CAL) Stopped (07/13/21 2304)   nafcillin 12 g in sodium chloride 0.9 % 500 mL continuous infusion 20.8 mL/hr at 07/14/21 0402     LOS: 25 days   Shelly Coss, MD [facility] P7/03/2021, 1:37 PM

## 2021-07-14 NOTE — Progress Notes (Signed)
Nutrition Follow-up  DOCUMENTATION CODES:   Not applicable  INTERVENTION:   Magic cup TID with meals, each supplement provides 290 kcal and 9 grams of protein. Vital Cuisine ShakeTBID with meals, each supplement provides 520 kcal and 22 grams of protein. MVI with minerals daily.  NUTRITION DIAGNOSIS:   Increased nutrient needs related to  (endocarditis) as evidenced by estimated needs.  Ongoing   GOAL:   Patient will meet greater than or equal to 90% of their needs  Progressing   MONITOR:   PO intake, Supplement acceptance, Diet advancement  REASON FOR ASSESSMENT:   Consult Enteral/tube feeding initiation and management  ASSESSMENT:   Pt with PMH of schizophrenia, HTN, and anemia admitted from home where she lives with her mother with embolic CVA and MSSA bacteremia due to suspected endocarditis.  6/22 Cortrak removed and TF off. 6/23 Diet advanced to dysphagia 1 with nectar thick liquids  Meal intakes: 0-50%  Spoke with pt and her mom. Pt does not like the pureed foods. We discussed that without a feeding tube she must consume enough nutrition on her own. She is willing to try and drink the vital cuisine drinks.   Patient is medically stable for discharge. Awaiting safe discharge plan; likely SNF for rehab.  Labs and medications reviewed.   Diet Order:   Diet Order             DIET - DYS 1 Room service appropriate? No; Fluid consistency: Nectar Thick  Diet effective now                   EDUCATION NEEDS:   No education needs have been identified at this time  Skin:  Skin Assessment: Skin Integrity Issues: Skin Integrity Issues:: Other (Comment), Stage II, Incisions Stage II: medial buttocks Incisions: L leg, chest Other: knee wound from recent fall  Last BM:  7/2 type 5  Height:   Ht Readings from Last 1 Encounters:  06/27/21 5\' 6"  (1.676 m)    Weight:   Wt Readings from Last 1 Encounters:  07/14/21 95.4 kg     BMI:  Body mass  index is 33.95 kg/m.  Estimated Nutritional Needs:   Kcal:  2000-2200  Protein:  115-130 grams  Fluid:  >2L/day   Lockie Pares., RD, LDN, CNSC See AMiON for contact information

## 2021-07-14 NOTE — Progress Notes (Signed)
Physical Therapy Treatment Patient Details Name: Debra Klein MRN: [id number] DOB: [birth year] Today's Date: 07/14/2021   History of Present Illness Pt is a 46 y.o. female admitted 06/19/2021 with AMS, poor PO intake and recent falls, one resulting in L ankle fx. Workup for severe sepsis, septic arthritis of L knee. MRI 6/8 shows large L MCA infarct. S/p L knee arthroscopy with debridement and synovectomy 6/13. Pt intubated 6/8-6/14. Reintubated for MVR on 6/16; extubated 6/18. Cortrak placed 6/19, pt pulled it out 6/22. PMH includes schizophrenia, HTN, anemia.    PT Comments    Pt admitted with above diagnosis. Pt was able to sit EOB for 15 min with min guard assist to sit.  Pt with multiple attempts to stand but was unable to get to full stand and fatigued and had to lay pt back down.  Pt having difficulty following commands today.  Appeared internally distracted.   Pt currently with functional limitations due to balance and endurance deficits. Pt will benefit from skilled PT to increase their independence and safety with mobility to allow discharge to the venue listed below.      Recommendations for follow up therapy are one component of a multi-disciplinary discharge planning process, led by the attending physician.  Recommendations may be updated based on patient status, additional functional criteria and insurance authorization.  Follow Up Recommendations  PT at Long-term acute care hospital     Assistance Recommended at Discharge Frequent or constant Supervision/Assistance  Patient can return home with the following Two people to help with walking and/or transfers;Two people to help with bathing/dressing/bathroom;Assistance with cooking/housework;Assistance with feeding;Direct supervision/assist for medications management;Direct supervision/assist for financial management;Assist for transportation;Help with stairs or ramp for entrance   Equipment Recommendations  Hospital bed;Wheelchair  (measurements PT);Wheelchair cushion (measurements PT);Other (comment)    Recommendations for Other Services Rehab consult     Precautions / Restrictions Precautions Precautions: Fall;Sternal Restrictions Weight Bearing Restrictions: Yes LLE Weight Bearing: Weight bearing as tolerated Other Position/Activity Restrictions: pt OK to not wear CAM boot per Dr. Ninfa Linden     Mobility  Bed Mobility Overal bed mobility: Needs Assistance Bed Mobility: Supine to Sit, Sit to Supine Rolling: +2 for physical assistance, Mod assist Sidelying to sit: Mod assist, +2 for physical assistance   Sit to supine: +2 for physical assistance, Total assist   General bed mobility comments: Cleaned pt intiially as pt had BM.  Noted that pt had some skin abrasions on buttocks and  nurse made aware.  Mod A to bring BLEs over to EOB and then initate elevating trunk. Tot  A +2 for return back to supine.    Transfers Overall transfer level: Needs assistance Equipment used: Ambulation equipment used Transfers: Sit to/from Stand Sit to Stand: Max assist, +2 physical assistance, Total assist, From elevated surface           General transfer comment: Max A +2 for power up into standing however even with cues and assist and use of pad, pt could not stand all the way up to [place] even with 3 attempts. Pts nurse came in and offeered to assist PT to stand pt to [place] with +3 assist however pt began to have posterior lean and needed total assist to lie back down and total assist +2 to lie pt back down. Transfer via Lift Equipment: [place]  Ambulation/Gait               General Gait Details: unable   Stairs  Wheelchair Mobility    Modified Rankin (Stroke Patients Only) Modified Rankin (Stroke Patients Only) Pre-Morbid Rankin Score: No symptoms Modified Rankin: Severe disability     Balance Overall balance assessment: Needs assistance Sitting-balance support: No upper extremity  supported, Feet supported Sitting balance-Leahy Scale: Poor Sitting balance - Comments: able to maintain static sitting EOB without assist with min guard assist but needed incr mod assist with  increased R lateral  and posterior lean with fatigue Postural control: Right lateral lean, Posterior lean Standing balance support: Bilateral upper extremity supported, During functional activity Standing balance-Leahy Scale: Zero Standing balance comment: Could not get pt to standing                            Cognition Arousal/Alertness: Awake/alert Behavior During Therapy: Flat affect Overall Cognitive Status: Impaired/Different from baseline Area of Impairment: Attention, Following commands, Safety/judgement, Awareness, Problem solving, Memory                   Current Attention Level: Sustained Memory: Decreased short-term memory Following Commands: Follows one step commands inconsistently, Follows one step commands with increased time Safety/Judgement: Decreased awareness of deficits Awareness: Intellectual Problem Solving: Slow processing, Decreased initiation, Requires tactile cues, Requires verbal cues General Comments: Pt more resistant to OOB activity this session (after session stating she didnt think she could do all that and appreciative of therapy) and benefits from calm, encouragement. Pt requiring increased time for responding to cues both in conversation and motor.        Exercises General Exercises - Lower Extremity Ankle Circles/Pumps: PROM, Both, 10 reps, Seated Long Arc Quad: PROM, Both, 10 reps, Seated Heel Slides: PROM, Right, 10 reps, Supine Hip Flexion/Marching: PROM, Both, 10 reps, Seated    General Comments        Pertinent Vitals/Pain Pain Assessment Pain Assessment: Faces Faces Pain Scale: Hurts even more Pain Location: bil LE Pain Descriptors / Indicators: Grimacing, Guarding, Moaning Pain Intervention(s): Limited activity within  patient's tolerance, Monitored during session, Repositioned    Home Living                          Prior Function            PT Goals (current goals can now be found in the care plan section) Progress towards PT goals: Progressing toward goals    Frequency    Min 3X/week      PT Plan Current plan remains appropriate    Co-evaluation              AM-PAC PT "6 Clicks" Mobility   Outcome Measure  Help needed turning from your back to your side while in a flat bed without using bedrails?: Total Help needed moving from lying on your back to sitting on the side of a flat bed without using bedrails?: Total Help needed moving to and from a bed to a chair (including a wheelchair)?: Total Help needed standing up from a chair using your arms (e.g., wheelchair or bedside chair)?: Total Help needed to walk in hospital room?: Total Help needed climbing 3-5 steps with a railing? : Total 6 Click Score: 6    End of Session Equipment Utilized During Treatment: Gait belt Activity Tolerance: Patient limited by fatigue Patient left: with call bell/phone within reach;in bed;with bed alarm set Nurse Communication: Mobility status;Need for lift equipment PT Visit Diagnosis: Other abnormalities of gait and mobility (  R26.89);Muscle weakness (generalized) (M62.81);Hemiplegia and hemiparesis Hemiplegia - Right/Left: Right Hemiplegia - dominant/non-dominant: Dominant Hemiplegia - caused by: Cerebral infarction     Time: 1638-4536 PT Time Calculation (min) (ACUTE ONLY): 38 min  Charges:  $Therapeutic Exercise: 8-22 mins $Therapeutic Activity: 23-37 mins                     [facility] M,PT Acute Rehab Services [place] 07/14/2021, 1:02 PM

## 2021-07-14 NOTE — TOC Progression Note (Signed)
Transition of Care [facility]) - Progression Note    Patient Details  Name: Debra Klein MRN: [id number] Date of Birth: 30-Apr-1975  [facility]) CM/SW Contact  Tom-Johnson, Renea Ee, RN Phone Number: 07/14/2021, 3:52 PM  Clinical Narrative:     CM received a call from Maricela Curet [phone]) at [facility] requesting for Peer to Peer with MD. MD notified. Awaiting response from [facility] for Rehab acceptance.  CM will continue to follow with needs.   Expected Discharge Plan: Skilled Nursing Facility Barriers to Discharge: SNF Pending bed offer  Expected Discharge Plan and Services Expected Discharge Plan: [place] In-house Referral: Clinical Social Work Discharge Planning Services: CM Consult Post Acute Care Choice: [place] arrangements for the past 2 months: Single Family Home                                       Social Determinants of Health (SDOH) Interventions    Readmission Risk Interventions     No data to display

## 2021-07-15 LAB — BASIC METABOLIC PANEL
Anion gap: 7 (ref 5–15)
BUN: 6 mg/dL (ref 6–20)
CO2: 24 mmol/L (ref 22–32)
Calcium: 8.4 mg/dL — ABNORMAL LOW (ref 8.9–10.3)
Chloride: 106 mmol/L (ref 98–111)
Creatinine, Ser: 0.76 mg/dL (ref 0.44–1.00)
GFR, Estimated: >60 mL/min (ref >60)
Glucose, Bld: 93 mg/dL (ref 70–99)
Potassium: 3.9 mmol/L (ref 3.5–5.1)
Sodium: 137 mmol/L (ref 135–145)

## 2021-07-15 LAB — CBC
HCT: 26.6 % — ABNORMAL LOW (ref 36.0–46.0)
Hemoglobin: 8.7 g/dL — ABNORMAL LOW (ref 12.0–15.0)
MCH: 29 pg (ref 26.0–34.0)
MCHC: 32.7 g/dL (ref 30.0–36.0)
MCV: 88.7 fL (ref 80.0–100.0)
Platelets: 446 10*3/uL — ABNORMAL HIGH (ref 150–400)
RBC: 3 MIL/uL — ABNORMAL LOW (ref 3.87–5.11)
RDW: 21.1 % — ABNORMAL HIGH (ref 11.5–15.5)
WBC: 9.4 10*3/uL (ref 4.0–10.5)
nRBC: 0 % (ref 0.0–0.2)

## 2021-07-15 LAB — GLUCOSE, CAPILLARY
Glucose-Capillary: 87 mg/dL (ref 70–99)
Glucose-Capillary: 93 mg/dL (ref 70–99)
Glucose-Capillary: 94 mg/dL (ref 70–99)
Glucose-Capillary: 97 mg/dL (ref 70–99)
Glucose-Capillary: 99 mg/dL (ref 70–99)

## 2021-07-15 NOTE — Progress Notes (Signed)
Speech Language Pathology Treatment: Dysphagia  Patient Details Name: Debra Klein MRN: [id number] DOB: February 01, 1975 Today's Date: 07/15/2021 Time: 7903-8333 SLP Time Calculation (min) (ACUTE ONLY): 15 min  Assessment / Plan / Recommendation Clinical Impression  Patient seen by SLP for skilled treatment session focused on dysphagia goals. Patient frequently interjecting, "I dont need speech therapy", "My swallowing is fine" but she was agreeable overall. She requested water which SLP thickened to nectar thick consistency. She asked SLP "do you have to touch my throat?" (Referring to when SLP's palpate larynx). Patient did not seem to notice that water had been thickened and she drank successive large straw sips. No overt s/s aspiration or penetration observed however MBS on 6/22 indicated silent aspiration. She did not demonstrate awareness or recall of previous swallow test even when SLP simplifying saying, "when you drank the white liquid". She was agreeable to participating in [facility] next date. When SLP getting ready to leave, patient asked, "when is the swallow test?" SLP then asked her, "do you remember what I told you?" And she said, "its tomorrow". SLP will plan to schedule MBS for next date.    HPI HPI: Patient is a 46 y.o. female with PMH: HTN, HLD, iron deficiency, anemia, schizophrenia who presented to the hospital on 06/17/21 to ED for evaluation of AMS. Patient initially non-verbal but awake and alert in ED. Mother reported patient as ambulatory and verbal at baseline but had not been eating or drinking for past two weeks (also not taking medications). She has had multiple falls, seen by orthopedic day before admission and questioned possible fracture left ankle and placed in Cam boot. In ED, patient admitted with severe sepsis of unknown source; MRI showed large Left MCA infarct. Patient required intubation on 6/8-6/15/23 and was intubated for surgical procedure (mitral valve repair) on 6/16,  extubated 6/18. Cortrak 6/19.      SLP Plan  Continue with current plan of care;MBS      Recommendations for follow up therapy are one component of a multi-disciplinary discharge planning process, led by the attending physician.  Recommendations may be updated based on patient status, additional functional criteria and insurance authorization.    Recommendations  Diet recommendations: Dysphagia 1 (puree);Nectar-thick liquid Liquids provided via: Cup;Straw Medication Administration: Crushed with puree Supervision: Staff to assist with self feeding;Full supervision/cueing for compensatory strategies Compensations: Slow rate;Small sips/bites;Lingual sweep for clearance of pocketing;Minimize environmental distractions Postural Changes and/or Swallow Maneuvers: Seated upright 90 degrees                General recommendations: Rehab consult Oral Care Recommendations: Oral care BID Follow Up Recommendations: Acute inpatient rehab (3hours/day) Assistance recommended at discharge: Frequent or constant Supervision/Assistance SLP Visit Diagnosis: Dysphagia, oropharyngeal phase (R13.12) Plan: Continue with current plan of care;MBS           Sonia Baller, MA, CCC-SLP Speech Therapy

## 2021-07-15 NOTE — Care Management Important Message (Signed)
Important Message  Patient Details  Name: Debra Klein MRN: [id number] Date of Birth: 04-Jul-1975   Medicare Important Message Given:  Yes     Shelda Altes 07/15/2021, 8:52 AM

## 2021-07-15 NOTE — Progress Notes (Signed)
18 Days Post-Op Procedure(s) (LRB): MITRAL VALVE REPAIR USING 28MM SIMUFORM SEMI-RIGID ANNULOPLASTY RING (N/A) TRANSESOPHAGEAL ECHOCARDIOGRAM (TEE) (N/A) Subjective: Alert and interactive this AM  Objective: Vital signs in last 24 hours: Temp:  [97.4 F (36.3 C)-98.6 F (37 C)] 98.6 F (37 C) (07/04 0756) Pulse Rate:  [84-92] 92 (07/04 0756) Resp:  [15-18] 15 (07/04 0756) BP: (113-141)/(79-91) 141/91 (07/04 0756) SpO2:  [93 %-100 %] 100 % (07/04 0756)  Hemodynamic parameters for last 24 hours:    Intake/Output from previous day: 07/03 0701 - 07/04 0700 In: 527.4 [IV Piggyback:527.4] Out: -  Intake/Output this shift: Total I/O In: -  Out: 700 [Urine:700]  General appearance: alert, cooperative, and no distress Neurologic: talkative, Moving RUE grip 4/5, minimal movement RLE Heart: RRR, no murmur Wound: clean and dry  Lab Results: Recent Labs    07/15/21 0400  WBC 9.4  HGB 8.7*  HCT 26.6*  PLT 446*   BMET:  Recent Labs    07/15/21 0400  NA 137  K 3.9  CL 106  CO2 24  GLUCOSE 93  BUN 6  CREATININE 0.76  CALCIUM 8.4*    PT/INR: No results for input(s): "LABPROT", "INR" in the last 72 hours. ABG    Component Value Date/Time   PHART 7.442 06/30/2021 2318   HCO3 18.2 (L) 06/30/2021 2318   TCO2 19 (L) 06/30/2021 2318   ACIDBASEDEF 5.0 (H) 06/30/2021 2318   O2SAT 70.5 07/01/2021 0407   CBG (last 3)  Recent Labs    07/14/21 1646 07/14/21 2352 07/15/21 0616  GLUCAP 97 97 93    Assessment/Plan: S/P Procedure(s) (LRB): MITRAL VALVE REPAIR USING 28MM SIMUFORM SEMI-RIGID ANNULOPLASTY RING (N/A) TRANSESOPHAGEAL ECHOCARDIOGRAM (TEE) (N/A) Looks much better today Significantly improved mental status Moving RUE with relatively good strength but lack of coordination Wound healing well   LOS: 26 days    Melrose Nakayama 07/15/2021

## 2021-07-15 NOTE — Progress Notes (Addendum)
.  .  .        xx

## 2021-07-15 NOTE — Progress Notes (Signed)
PROGRESS NOTE  Debra Klein  TZG:[id number] DOB: 05-20-1975 DOA: 06/19/2021 PCP: Gaynelle Arabian, MD   Brief Narrative: Patient is a 46 year old female with history of schizophrenia , hypertension, hyperlipidemia who initially presented with acute encephalopathy.  No report of eating or drinking for 2 weeks, not taking medications, having multiple falls and recently sustained fracture on the left ankle, placed on cam boot.  She was found on the floor.  On presentation she was found to have fever, leukocytosis, AKI, elevated LFT, hyperkalemia, lactic acidosis.  Patient was started on IV fluids, empiric antibiotics and was admitted.  Her encephalopathy continued to deteriorate and she was finally intubated and was transferred to [facility] service.  CT head showed insular hypodensity concerning for MCA infarct.  Hospital course also remarkable for finding of bacteremia,mitral valve endocarditis, status post mitral valve repair.  ID was following.  Prolonged hospitalization.  Patient transferred to [facility] service on 6/22.  Currently hemodynamically stable.  PT/OT recommending acute inpatient rehab on discharge ,TOC following.  Medically stable for dc when possible.  Important events  6/7: Fall --> fibula fracture, Minimally verbal at an outpatient visit with [facility]  6/8: Presented to the ED nonverbal with altered mentation. Progressively worsening mentation with requirement of intubation for airway protection.  Empirically treated for sepsis, presumed meningitis. MRI with large MCA infarct and punctate right hemisphere infarcts.  6/9: Blood cultures obtained on arrival positive for MSSA  6/10 MRI knee demonstrates septic arthritis.  6/13 L knee arthroscopy with I&D with snyovectomy for septic arthritis, TEE 6/14 steroids for no cuff leak 6/15 extubated, much more alert 6/16 mitral valve repair. Left chest tube placed for pleural effusion  6/17 too weak to come off vent. Not following commands.  6/18  extubated 6/19 required BiPAP due to decreased responsiveness overnight after getting antipsychotic meds    Assessment & Plan:  Principal Problem:   Sepsis ([place]) Active Problems:   Schizophrenia ([place])   Transaminitis   AKI (acute kidney injury) ([place])   Bacteremia due to methicillin susceptible Staphylococcus aureus (MSSA)   Toxic encephalopathy   Cerebrovascular accident (CVA) due to embolic occlusion of left middle cerebral artery (HCC)   Acute respiratory failure with hypoxia (HCC)   Septic arthritis of knee, left (HCC)   S/P mitral valve repair   Pressure injury of skin   Acute bacterial endocarditis  Sepsis/MSSA bacteremia/Staph epidermidis mitral valve endocarditis: ID was following.  Continue current antibiotics.  Still has leukocytosis but improving.  Afebrile.  Requires long-term antibiotic therapy.  Currently on nafcillin, plan to continue through 7/28. Has a picc line on right upper extremity  Staph epidermidis mitral valve endocarditis: Cardiothoracic surgery following, status post mitral valve repair.  Acute metabolic encephalopathy: Secondary to sepsis, stroke.  Mental status has significantly improved and is currently alert and oriented.  Still has some degree of dysarthria but significantly better.  left MCA stroke due to septic emboli: Neurology was following.  Continue to monitor mental status.  Has paraplegia.  Continue aspirin  Brief episode of A-fib: Cardiothoracic surgery started on amiodarone,being tapered.  Also on metoprolol.  Currently in normal sinus rhythm.  Acute respiratory failure: Secondary to left-sided atelectasis, left-sided pleural effusion.  Currently on room air.  Encourage incentive spirometry ,flutter valve  Dysphagia: Secondary to stroke.  She was on core track.  Speech therapy following, recommending dysphagia 1 diet, core track taken out.  Cardiogenic shock: Suspected to be from mitral valve regurgitation.  She was on IV diuresis.  She was  on inotropes, vasopressors, now discontinued.  Currently blood pressure stable.  Severe hypokalemia: Continue to monitor and supplement  Left knee septic arthritis: MRI of the left knee on 6/10 showed septic arthritis, peritubular osteomyelitis with subcu abscess.  Status post diagnostic arthrocentesis on 6/12.  Cultures showed MSSA.  Status post left knee arthroscopy, I&D, synovectomy.  Continue current antibiotics.  She needs outpatient follow-up with orthopedics  Normocytic anemia: Currently hemoglobin stable.  Diarrhea: Improved,rectal tube out  Prediabetes: Hemoglobin A1c of 6.1.  Currently on sliding scale insulin  History of schizophrenia: Continue fluphenazine, clozapine  Disposition: PT/OT recommended acute inpatient rehab.  TOC following     Nutrition Problem: Increased nutrient needs Etiology:  (endocarditis) Pressure Injury 06/27/21 Buttocks Posterior;Medial Stage 2 -  Partial thickness loss of dermis presenting as a shallow open injury with a red, pink wound bed without slough. (Active)  06/27/21 1900  Location: Buttocks  Location Orientation: Posterior;Medial  Staging: Stage 2 -  Partial thickness loss of dermis presenting as a shallow open injury with a red, pink wound bed without slough.  Wound Description (Comments):   Present on Admission:   Dressing Type Foam - Lift dressing to assess site every shift 07/14/21 2000    DVT prophylaxis:enoxaparin (LOVENOX) injection 40 mg Start: 07/02/21 2000 SCDs Start: 06/27/21 1518     Code Status: Full Code  Family Communication:: Discussed with mother on phone on 6/23  Patient status: Inpatient  Patient is from : Home  Anticipated discharge to: Inpatient rehab   Estimated DC date: Not sure,waiting for bed   Consultants: PCCM, cardiology, cardiothoracic surgery, neurology, ID  Procedures: As above  Antimicrobials:  Anti-infectives (From admission, onward)    Start     Dose/Rate Route Frequency Ordered Stop    06/30/21 1600  vancomycin (VANCOREADY) IVPB 1750 mg/350 mL  Status:  Discontinued        1,750 mg 175 mL/hr over 120 Minutes Intravenous Every 24 hours 06/29/21 1429 06/30/21 0746   06/30/21 1600  vancomycin (VANCOREADY) IVPB 1250 mg/250 mL  Status:  Discontinued        1,250 mg 166.7 mL/hr over 90 Minutes Intravenous Every 24 hours 06/30/21 0746 06/30/21 1351   06/30/21 1500  nafcillin 12 g in sodium chloride 0.9 % 500 mL continuous infusion        12 g 20.8 mL/hr over 24 Hours Intravenous Every 24 hours 06/30/21 1351     06/29/21 1515  vancomycin (VANCOREADY) IVPB 2000 mg/400 mL        2,000 mg 200 mL/hr over 120 Minutes Intravenous  Once 06/29/21 1416 06/29/21 1700   06/27/21 2215  vancomycin (VANCOCIN) IVPB 1000 mg/200 mL premix        1,000 mg 200 mL/hr over 60 Minutes Intravenous  Once 06/27/21 1517 06/27/21 2359   06/27/21 1615  ceFAZolin (ANCEF) IVPB 2g/100 mL premix        2 g 200 mL/hr over 30 Minutes Intravenous Every 8 hours 06/27/21 1517 06/29/21 0538   06/27/21 0400  vancomycin (VANCOCIN) 1,500 mg in sodium chloride 0.9 % 250 mL IVPB        1,500 mg 132.5 mL/hr over 120 Minutes Intravenous To Surgery 06/26/21 1345 06/27/21 1135   06/27/21 0400  ceFAZolin (ANCEF) IVPB 2g/100 mL premix        2 g 200 mL/hr over 30 Minutes Intravenous To Surgery 06/26/21 1345 06/27/21 0945   06/27/21 0400  ceFAZolin (ANCEF) IVPB 2g/100 mL premix  2 g 200 mL/hr over 30 Minutes Intravenous To Surgery 06/26/21 1345 06/27/21 1419   06/20/21 2200  vancomycin (VANCOREADY) IVPB 1250 mg/250 mL  Status:  Discontinued        1,250 mg 166.7 mL/hr over 90 Minutes Intravenous Every 24 hours 06/19/21 2203 06/19/21 2210   06/20/21 1800  nafcillin 12 g in sodium chloride 0.9 % 500 mL continuous infusion  Status:  Discontinued        12 g 20.8 mL/hr over 24 Hours Intravenous Every 24 hours 06/20/21 1005 06/29/21 1411   06/20/21 1000  vancomycin (VANCOREADY) IVPB 750 mg/150 mL  Status:   Discontinued        750 mg 150 mL/hr over 60 Minutes Intravenous Every 12 hours 06/19/21 2210 06/20/21 1005   06/20/21 0800  acyclovir (ZOVIRAX) 820 mg in dextrose 5 % 250 mL IVPB  Status:  Discontinued        10 mg/kg  81.9 kg 266.4 mL/hr over 60 Minutes Intravenous Every 12 hours 06/19/21 2207 06/20/21 1005   06/20/21 0600  cefTRIAXone (ROCEPHIN) 2 g in sodium chloride 0.9 % 100 mL IVPB  Status:  Discontinued        2 g 200 mL/hr over 30 Minutes Intravenous Every 12 hours 06/19/21 1825 06/20/21 1005   06/19/21 1830  acyclovir (ZOVIRAX) 820 mg in dextrose 5 % 250 mL IVPB        10 mg/kg  81.8 kg (Order-Specific) 266.4 mL/hr over 60 Minutes Intravenous STAT 06/19/21 1817 06/19/21 1956   06/19/21 1830  vancomycin (VANCOCIN) 1,750 mg in sodium chloride 0.9 % 500 mL IVPB        1,750 mg 258.8 mL/hr over 120 Minutes Intravenous STAT 06/19/21 1817 06/20/21 0000   06/19/21 1730  vancomycin (VANCOCIN) IVPB 1000 mg/200 mL premix  Status:  Discontinued        1,000 mg 200 mL/hr over 60 Minutes Intravenous STAT 06/19/21 1725 06/19/21 1817   06/19/21 1700  cefTRIAXone (ROCEPHIN) 2 g in sodium chloride 0.9 % 100 mL IVPB        2 g 200 mL/hr over 30 Minutes Intravenous  Once 06/19/21 1655 06/19/21 1826       Subjective:  Patient seen and examined the bedside this morning.  Lying on bed.  Comfortable without any complaints.  Alert and oriented.  Objective: Vitals:   07/14/21 1952 07/14/21 2348 07/15/21 0321 07/15/21 0756  BP: 117/79 123/81 113/83 (!) 141/91  Pulse: 90 84 86 92  Resp: 18 18 18 15   Temp: 98.3 F (36.8 C) 98.3 F (36.8 C) (!) 97.4 F (36.3 C) 98.6 F (37 C)  TempSrc: Oral Oral Oral Oral  SpO2: 100% 93% 100% 100%  Weight:      Height:        Intake/Output Summary (Last 24 hours) at 07/15/2021 1124 Last data filed at 07/15/2021 0757 Gross per 24 hour  Intake 527.44 ml  Output 700 ml  Net -172.56 ml   Filed Weights   07/11/21 0500 07/12/21 0624 07/14/21 0655  Weight:  95.1 kg 95 kg 95.4 kg    Examination:  General exam: Overall comfortable, not in distress, weak, lying in bed HEENT: PERRL Respiratory system:  no wheezes or crackles  Cardiovascular system: S1 & S2 heard, RRR.  Gastrointestinal system: Abdomen is nondistended, soft and nontender. Central nervous system: Alert and oriented, paraplegia Extremities: No edema, no clubbing ,no cyanosis Skin: No rashes, no ulcers,no icterus     Data Reviewed: I have personally  reviewed following labs and imaging studies  CBC: Recent Labs  Lab 07/09/21 0515 07/09/21 1431 07/10/21 0505 07/15/21 0400  WBC 10.5 13.4* 9.7 9.4  NEUTROABS  --  11.1*  --   --   HGB 8.6* 9.1* 8.2* 8.7*  HCT 26.0* 28.3* 25.2* 26.6*  MCV 87.8 87.3 87.8 88.7  PLT 469* 510* 458* 761*   Basic Metabolic Panel: Recent Labs  Lab 07/09/21 0515 07/10/21 0505 07/11/21 0400 07/12/21 0500 07/15/21 0400  NA 135 137 138 138 137  K 3.1* 2.9* 3.2* 3.7 3.9  CL 102 106 106 105 106  CO2 26 27 27 25 24   GLUCOSE 94 96 102* 100* 93  BUN 7 5* 5* 5* 6  CREATININE 0.75 0.80 0.79 0.83 0.76  CALCIUM 8.2* 8.3* 8.3* 8.5* 8.4*  MG  --  1.9  --   --   --      No results found for this or any previous visit (from the past 240 hour(s)).    Radiology Studies: No results found.  Scheduled Meds:  amiodarone  200 mg Oral Daily   aspirin  81 mg Oral Daily   benztropine  1 mg Oral QHS   Chlorhexidine Gluconate Cloth  6 each Topical Daily   cloZAPine  50 mg Oral QHS   enoxaparin (LOVENOX) injection  40 mg Subcutaneous Q24H   fluPHENAZine  1 mg Oral QHS   insulin aspart  0-24 Units Subcutaneous Q6H   LORazepam  0.5 mg Oral Once   metoprolol tartrate  12.5 mg Oral BID   Or   metoprolol tartrate  12.5 mg Per Tube BID   multivitamin with minerals  1 tablet Oral Daily   mouth rinse  15 mL Mouth Rinse 4 times per day   pantoprazole  40 mg Oral Daily   potassium chloride  40 mEq Oral Daily   vitamin B-12  1,000 mcg Oral Daily    Continuous Infusions:  sodium chloride     feeding supplement (OSMOLITE 1.5 CAL) Stopped (07/13/21 2304)   nafcillin 12 g in sodium chloride 0.9 % 500 mL continuous infusion 20.8 mL/hr at 07/15/21 0605     LOS: 26 days   Shelly Coss, MD [facility] P7/04/2021, 11:24 AM

## 2021-07-16 ENCOUNTER — Other Ambulatory Visit: Payer: Self-pay | Admitting: Home Health

## 2021-07-16 ENCOUNTER — Inpatient Hospital Stay (HOSPITAL_COMMUNITY): Payer: Medicare Other

## 2021-07-16 DIAGNOSIS — I059 Rheumatic mitral valve disease, unspecified: Secondary | ICD-10-CM

## 2021-07-16 LAB — GLUCOSE, CAPILLARY
Glucose-Capillary: 90 mg/dL (ref 70–99)
Glucose-Capillary: 99 mg/dL (ref 70–99)

## 2021-07-16 NOTE — Progress Notes (Signed)
Modified Barium Swallow Progress Note  Patient Details  Name: Debra Klein MRN: [id number] Date of Birth: Jul 31, 1975  Today's Date: 07/16/2021  Modified Barium Swallow completed.  Full report located under Chart Review in the Imaging Section.  Brief recommendations include the following:  Clinical Impression  Patient exhibits improved swallow function as compared to MBS on 07/03/21. During today's test, oral phase was [facility] with nectar thick and thin liquids and mildly delayed with puree solids with barium tablet. She refused all other solids offered. Swallow initiation delay at level of vallecular sinus was observed with nectar thick liquids and with thin liquids, swallow was initiated at level of pyriform sinus. No penetration or aspiration observed with nectar thick liquids. Patient did exhibit flash penetration (PAS 2) with thin liquids but penetrate was well above vocal cords and fully exited laryngeal vestibule without difficulty. No aspiration observed even when patient taking large successive straw sips with thin liquids. SLP recommending upgrade liquids to thin and SLP will follow for toleration and ability to upgrade solids at bedside.   Swallow Evaluation Recommendations       SLP Diet Recommendations: Dysphagia 1 (Puree) solids;Thin liquid   Liquid Administration via: Cup;Straw   Medication Administration: Crushed with puree   Supervision: Staff to assist with self feeding;Full supervision/cueing for compensatory strategies   Compensations: Slow rate;Small sips/bites;Lingual sweep for clearance of pocketing;Minimize environmental distractions   Postural Changes: Seated upright at 90 degrees   Oral Care Recommendations: Oral care BID      Sonia Baller, MA, CCC-SLP Speech Therapy

## 2021-07-16 NOTE — Progress Notes (Addendum)
PROGRESS NOTE  Debra Klein  MBW:[id number] DOB: 03-07-1975 DOA: 06/19/2021 PCP: Gaynelle Arabian, MD   Brief Narrative: Patient is a 46 year old female with history of schizophrenia , hypertension, hyperlipidemia who initially presented with acute encephalopathy.  No report of eating or drinking for 2 weeks, not taking medications, having multiple falls and recently sustained fracture on the left ankle, placed on cam boot.  She was found on the floor.  On presentation she was found to have fever, leukocytosis, AKI, elevated LFT, hyperkalemia, lactic acidosis.  Patient was started on IV fluids, empiric antibiotics and was admitted.  Her encephalopathy continued to deteriorate and she was finally intubated and was transferred to [facility] service.  CT head showed insular hypodensity concerning for MCA infarct.  Hospital course also remarkable for finding of bacteremia,mitral valve endocarditis, status post mitral valve repair.  ID was following.  Prolonged hospitalization.  Patient transferred to [facility] service on 6/22.  Currently hemodynamically stable.  PT/OT recommending acute inpatient rehab on discharge ,TOC following.  Medically stable for dc when possible.  Important events  6/7: Fall --> fibula fracture, Minimally verbal at an outpatient visit with [facility]  6/8: Presented to the ED nonverbal with altered mentation. Progressively worsening mentation with requirement of intubation for airway protection.  Empirically treated for sepsis, presumed meningitis. MRI with large MCA infarct and punctate right hemisphere infarcts.  6/9: Blood cultures obtained on arrival positive for MSSA  6/10 MRI knee demonstrates septic arthritis.  6/13 L knee arthroscopy with I&D with snyovectomy for septic arthritis, TEE 6/14 steroids for no cuff leak 6/15 extubated, much more alert 6/16 mitral valve repair. Left chest tube placed for pleural effusion  6/17 too weak to come off vent. Not following commands.  6/18  extubated 6/19 required BiPAP due to decreased responsiveness overnight after getting antipsychotic meds    Assessment & Plan:  Principal Problem:   Sepsis ([place]) Active Problems:   Schizophrenia ([place])   Transaminitis   AKI (acute kidney injury) ([place])   Bacteremia due to methicillin susceptible Staphylococcus aureus (MSSA)   Toxic encephalopathy   Cerebrovascular accident (CVA) due to embolic occlusion of left middle cerebral artery (HCC)   Acute respiratory failure with hypoxia (HCC)   Septic arthritis of knee, left (HCC)   S/P mitral valve repair   Pressure injury of skin   Acute bacterial endocarditis  Sepsis/MSSA bacteremia/Staph epidermidis mitral valve endocarditis: ID was following.  Continue current antibiotics.  Still has leukocytosis but improving.  Afebrile.  Requires long-term antibiotic therapy.  Currently on nafcillin, plan to continue through 7/28. Has a picc line on right upper extremity  Staph epidermidis mitral valve endocarditis: Cardiothoracic surgery following, status post mitral valve repair.  Acute metabolic encephalopathy: Secondary to sepsis, stroke.  Mental status has significantly improved and is currently alert and oriented.  Still has some degree of dysarthria but significantly better.  left MCA stroke due to septic emboli: Neurology was following.  Continue to monitor mental status.  Has paraplegia.  Continue aspirin  Brief episode of A-fib: Cardiothoracic surgery started on amiodarone,being tapered.  Also on metoprolol.  Currently in normal sinus rhythm.  Acute respiratory failure: Secondary to left-sided atelectasis, left-sided pleural effusion.  Currently on room air.  Encourage incentive spirometry ,flutter valve  Dysphagia: Secondary to stroke.  She was on core track.  Speech therapy following, recommending dysphagia 1 diet, core track taken out.  Cardiogenic shock: Suspected to be from mitral valve regurgitation.  She was on IV diuresis.  She was  on inotropes, vasopressors, now discontinued.  Currently blood pressure stable.  Severe hypokalemia: Continue to monitor and supplement  Left knee septic arthritis: MRI of the left knee on 6/10 showed septic arthritis, peritubular osteomyelitis with subcu abscess.  Status post diagnostic arthrocentesis on 6/12.  Cultures showed MSSA.  Status post left knee arthroscopy, I&D, synovectomy.  Continue current antibiotics.  She needs outpatient follow-up with orthopedics  Normocytic anemia: Currently hemoglobin stable.  Diarrhea: Improved,rectal tube out  Prediabetes: Hemoglobin A1c of 6.1.  Currently on sliding scale insulin  History of schizophrenia: Continue fluphenazine, clozapine  Disposition: PT/OT recommended acute inpatient rehab.  TOC following  Pressure Injury 06/27/21 Buttocks Posterior;Medial Stage 2 -  Partial thickness loss of dermis presenting as a shallow open injury with a red, pink wound bed without slough. (Active)  06/27/21 1900  Location: Buttocks  Location Orientation: Posterior;Medial  Staging: Stage 2 -  Partial thickness loss of dermis presenting as a shallow open injury with a red, pink wound bed without slough.  Wound Description (Comments):   Present on Admission: -- (Admit to [place])       Nutrition Problem: Increased nutrient needs Etiology:  (endocarditis) Pressure Injury 06/27/21 Buttocks Posterior;Medial Stage 2 -  Partial thickness loss of dermis presenting as a shallow open injury with a red, pink wound bed without slough. (Active)  06/27/21 1900  Location: Buttocks  Location Orientation: Posterior;Medial  Staging: Stage 2 -  Partial thickness loss of dermis presenting as a shallow open injury with a red, pink wound bed without slough.  Wound Description (Comments):   Present on Admission:   Dressing Type Foam - Lift dressing to assess site every shift 07/15/21 2000    DVT prophylaxis:enoxaparin (LOVENOX) injection 40 mg Start: 07/02/21 2000 SCDs  Start: 06/27/21 1518     Code Status: Full Code  Family Communication:: Discussed with mother on phone on 6/23  Patient status: Inpatient  Patient is from : Home  Anticipated discharge to: Inpatient rehab   Estimated DC date: Not sure,waiting for bed   Consultants: PCCM, cardiology, cardiothoracic surgery, neurology, ID  Procedures: As above  Antimicrobials:  Anti-infectives (From admission, onward)    Start     Dose/Rate Route Frequency Ordered Stop   06/30/21 1600  vancomycin (VANCOREADY) IVPB 1750 mg/350 mL  Status:  Discontinued        1,750 mg 175 mL/hr over 120 Minutes Intravenous Every 24 hours 06/29/21 1429 06/30/21 0746   06/30/21 1600  vancomycin (VANCOREADY) IVPB 1250 mg/250 mL  Status:  Discontinued        1,250 mg 166.7 mL/hr over 90 Minutes Intravenous Every 24 hours 06/30/21 0746 06/30/21 1351   06/30/21 1500  nafcillin 12 g in sodium chloride 0.9 % 500 mL continuous infusion        12 g 20.8 mL/hr over 24 Hours Intravenous Every 24 hours 06/30/21 1351     06/29/21 1515  vancomycin (VANCOREADY) IVPB 2000 mg/400 mL        2,000 mg 200 mL/hr over 120 Minutes Intravenous  Once 06/29/21 1416 06/29/21 1700   06/27/21 2215  vancomycin (VANCOCIN) IVPB 1000 mg/200 mL premix        1,000 mg 200 mL/hr over 60 Minutes Intravenous  Once 06/27/21 1517 06/27/21 2359   06/27/21 1615  ceFAZolin (ANCEF) IVPB 2g/100 mL premix        2 g 200 mL/hr over 30 Minutes Intravenous Every 8 hours 06/27/21 1517 06/29/21 0538   06/27/21 0400  vancomycin (VANCOCIN) 1,500  mg in sodium chloride 0.9 % 250 mL IVPB        1,500 mg 132.5 mL/hr over 120 Minutes Intravenous To Surgery 06/26/21 1345 06/27/21 1135   06/27/21 0400  ceFAZolin (ANCEF) IVPB 2g/100 mL premix        2 g 200 mL/hr over 30 Minutes Intravenous To Surgery 06/26/21 1345 06/27/21 0945   06/27/21 0400  ceFAZolin (ANCEF) IVPB 2g/100 mL premix        2 g 200 mL/hr over 30 Minutes Intravenous To Surgery 06/26/21 1345  06/27/21 1419   06/20/21 2200  vancomycin (VANCOREADY) IVPB 1250 mg/250 mL  Status:  Discontinued        1,250 mg 166.7 mL/hr over 90 Minutes Intravenous Every 24 hours 06/19/21 2203 06/19/21 2210   06/20/21 1800  nafcillin 12 g in sodium chloride 0.9 % 500 mL continuous infusion  Status:  Discontinued        12 g 20.8 mL/hr over 24 Hours Intravenous Every 24 hours 06/20/21 1005 06/29/21 1411   06/20/21 1000  vancomycin (VANCOREADY) IVPB 750 mg/150 mL  Status:  Discontinued        750 mg 150 mL/hr over 60 Minutes Intravenous Every 12 hours 06/19/21 2210 06/20/21 1005   06/20/21 0800  acyclovir (ZOVIRAX) 820 mg in dextrose 5 % 250 mL IVPB  Status:  Discontinued        10 mg/kg  81.9 kg 266.4 mL/hr over 60 Minutes Intravenous Every 12 hours 06/19/21 2207 06/20/21 1005   06/20/21 0600  cefTRIAXone (ROCEPHIN) 2 g in sodium chloride 0.9 % 100 mL IVPB  Status:  Discontinued        2 g 200 mL/hr over 30 Minutes Intravenous Every 12 hours 06/19/21 1825 06/20/21 1005   06/19/21 1830  acyclovir (ZOVIRAX) 820 mg in dextrose 5 % 250 mL IVPB        10 mg/kg  81.8 kg (Order-Specific) 266.4 mL/hr over 60 Minutes Intravenous STAT 06/19/21 1817 06/19/21 1956   06/19/21 1830  vancomycin (VANCOCIN) 1,750 mg in sodium chloride 0.9 % 500 mL IVPB        1,750 mg 258.8 mL/hr over 120 Minutes Intravenous STAT 06/19/21 1817 06/20/21 0000   06/19/21 1730  vancomycin (VANCOCIN) IVPB 1000 mg/200 mL premix  Status:  Discontinued        1,000 mg 200 mL/hr over 60 Minutes Intravenous STAT 06/19/21 1725 06/19/21 1817   06/19/21 1700  cefTRIAXone (ROCEPHIN) 2 g in sodium chloride 0.9 % 100 mL IVPB        2 g 200 mL/hr over 30 Minutes Intravenous  Once 06/19/21 1655 06/19/21 1826       Subjective:  Patient seen and examined at bedside this morning.  Remains comfortable, hemodynamically stable without any new complaints.  On room air.  Complains of bilateral lower extremity pain  Objective: Vitals:   07/15/21  1954 07/15/21 2349 07/16/21 0401 07/16/21 0819  BP: 125/87 116/82 131/80 127/80  Pulse: 91 95 90 98  Resp: 20 20 17 17   Temp: 98.6 F (37 C) 98.6 F (37 C) 97.8 F (36.6 C) 98 F (36.7 C)  TempSrc: Oral Oral Oral Axillary  SpO2: 90% 100% 94% 100%  Weight:      Height:        Intake/Output Summary (Last 24 hours) at 07/16/2021 1122 Last data filed at 07/16/2021 0916 Gross per 24 hour  Intake 75 ml  Output 900 ml  Net -825 ml   [employer]  07/11/21 0500 07/12/21 0624 07/14/21 0655  Weight: 95.1 kg 95 kg 95.4 kg    Examination:  General exam: Overall comfortable, not in distress, very deconditioned, weak, obese HEENT: PERRL Respiratory system:  no wheezes or crackles  Cardiovascular system: S1 & S2 heard, RRR.  Gastrointestinal system: Abdomen is nondistended, soft and nontender. Central nervous system: Alert and oriented, paraplegia Extremities: No edema, no clubbing ,no cyanosis Skin: No rashes,pressure ulcers as above,no icterus     Data Reviewed: I have personally reviewed following labs and imaging studies  CBC: Recent Labs  Lab 07/09/21 1431 07/10/21 0505 07/15/21 0400  WBC 13.4* 9.7 9.4  NEUTROABS 11.1*  --   --   HGB 9.1* 8.2* 8.7*  HCT 28.3* 25.2* 26.6*  MCV 87.3 87.8 88.7  PLT 510* 458* 594*   Basic Metabolic Panel: Recent Labs  Lab 07/10/21 0505 07/11/21 0400 07/12/21 0500 07/15/21 0400  NA 137 138 138 137  K 2.9* 3.2* 3.7 3.9  CL 106 106 105 106  CO2 27 27 25 24   GLUCOSE 96 102* 100* 93  BUN 5* 5* 5* 6  CREATININE 0.80 0.79 0.83 0.76  CALCIUM 8.3* 8.3* 8.5* 8.4*  MG 1.9  --   --   --      No results found for this or any previous visit (from the past 240 hour(s)).    Radiology Studies: No results found.  Scheduled Meds:  amiodarone  200 mg Oral Daily   aspirin  81 mg Oral Daily   benztropine  1 mg Oral QHS   Chlorhexidine Gluconate Cloth  6 each Topical Daily   cloZAPine  50 mg Oral QHS   enoxaparin (LOVENOX) injection  40  mg Subcutaneous Q24H   fluPHENAZine  1 mg Oral QHS   insulin aspart  0-24 Units Subcutaneous Q6H   LORazepam  0.5 mg Oral Once   metoprolol tartrate  12.5 mg Oral BID   Or   metoprolol tartrate  12.5 mg Per Tube BID   multivitamin with minerals  1 tablet Oral Daily   mouth rinse  15 mL Mouth Rinse 4 times per day   pantoprazole  40 mg Oral Daily   potassium chloride  40 mEq Oral Daily   vitamin B-12  1,000 mcg Oral Daily   Continuous Infusions:  sodium chloride     feeding supplement (OSMOLITE 1.5 CAL) Stopped (07/13/21 2304)   nafcillin 12 g in sodium chloride 0.9 % 500 mL continuous infusion 12 g (07/15/21 1928)     LOS: 27 days   Shelly Coss, MD [facility] P7/05/2021, 11:22 AM

## 2021-07-16 NOTE — TOC Progression Note (Addendum)
Transition of Care [facility]) - Progression Note    Patient Details  Name: Debra Klein MRN: [id number] Date of Birth: 04-21-1975  [facility] CM/SW Contact  Tom-Johnson, Renea Ee, RN Phone Number: 07/16/2021, 1:49 PM  Clinical Narrative:     CM received a call from Maricela Curet with [employer] [phone]) that patient's insurance denied P2P. States family is appealing and requested supporting documents faxed to [phone]). Documents faxed with successful notice received. CM will continue to follow with needs.  Expected Discharge Plan: Skilled Nursing Facility Barriers to Discharge: SNF Pending bed offer  Expected Discharge Plan and Services Expected Discharge Plan: [place] In-house Referral: Clinical Social Work Discharge Planning Services: CM Consult Post Acute Care Choice: [place] arrangements for the past 2 months: Single Family Home                                       Social Determinants of Health (SDOH) Interventions    Readmission Risk Interventions     No data to display

## 2021-07-16 NOTE — Progress Notes (Addendum)
Physical Therapy Treatment Patient Details Name: Debra Klein MRN: [id number] DOB: 12-16-1975 Today's Date: 07/16/2021   History of Present Illness Pt is a 46 y.o. female admitted 06/19/2021 with AMS, poor PO intake and recent falls, one resulting in L ankle fx. Workup for severe sepsis, septic arthritis of L knee. MRI 6/8 shows large L MCA infarct. S/p L knee arthroscopy with debridement and synovectomy 6/13. Pt intubated 6/8-6/14. Reintubated for MVR on 6/16; extubated 6/18. Cortrak placed 6/19, pt pulled it out 6/22. PMH includes schizophrenia, HTN, anemia.    PT Comments    Pt admitted with above diagnosis. Pt able to sit EOB with min guard assist with pt being more participatory and more easily redirected to task today with cues. Pts first stand attempt was a little better today still needing max assist of 2 but clearing buttocks off bed.  Pt met 3/5 goals set previously. Revised goals. Will continue to progress pt as able.  Pt currently with functional limitations due to balance and endurance deficits.  Pt will benefit from skilled PT to increase their independence and safety with mobility to allow discharge to the venue listed below.      Recommendations for follow up therapy are one component of a multi-disciplinary discharge planning process, led by the attending physician.  Recommendations may be updated based on patient status, additional functional criteria and insurance authorization.  Follow Up Recommendations  PT at Long-term acute care hospital     Assistance Recommended at Discharge Frequent or constant Supervision/Assistance  Patient can return home with the following Two people to help with walking and/or transfers;Two people to help with bathing/dressing/bathroom;Assistance with cooking/housework;Assistance with feeding;Direct supervision/assist for medications management;Direct supervision/assist for financial management;Assist for transportation;Help with stairs or ramp for  entrance   Equipment Recommendations  Hospital bed;Wheelchair (measurements PT);Wheelchair cushion (measurements PT);Other (comment)    Recommendations for Other Services Rehab consult     Precautions / Restrictions Precautions Precautions: Fall;Sternal Precaution Booklet Issued: No Restrictions Weight Bearing Restrictions: Yes LLE Weight Bearing: Weight bearing as tolerated Other Position/Activity Restrictions: pt OK to not wear CAM boot per Dr. Ninfa Linden     Mobility  Bed Mobility Overal bed mobility: Needs Assistance Bed Mobility: Supine to Sit, Sit to Supine Rolling: +2 for physical assistance, Mod assist, Min assist Sidelying to sit: Min assist, +2 for safety/equipment, HOB elevated Supine to sit: Min assist, +2 for physical assistance, +2 for safety/equipment, HOB elevated Sit to supine: +2 for physical assistance, Total assist   General bed mobility comments: Cleaned pt intiially as pt had BM. Min A to bring BLEs over to EOB and then initate elevating trunk. Tot  A +2 for return back to supine.    Transfers Overall transfer level: Needs assistance Equipment used: Rolling walker (2 wheels) Transfers: Sit to/from Stand Sit to Stand: Max assist, +2 physical assistance, From elevated surface           General transfer comment: Max A +2 for power up into standing however even with cues and assist and use of pad, pt could not stand all the way up to RW even with 2 attempts. First attempt was best attempt with pt clearing buttocks off of bed about an inch. the second attempt pt had more difficulty with standing and needed incr assist and use of pad moreso and did not clear buttocks fully off bed.    Ambulation/Gait               General Gait Details: unable  Stairs             Wheelchair Mobility    Modified Rankin (Stroke Patients Only) Modified Rankin (Stroke Patients Only) Pre-Morbid Rankin Score: No symptoms Modified Rankin: Severe  disability     Balance Overall balance assessment: Needs assistance Sitting-balance support: No upper extremity supported, Feet supported Sitting balance-Leahy Scale: Fair Sitting balance - Comments: able to maintain static sitting EOB without assist with min guard assist Postural control: Right lateral lean, Posterior lean Standing balance support: Bilateral upper extremity supported, During functional activity Standing balance-Leahy Scale: Zero Standing balance comment: Unable to achieve full stand                            Cognition Arousal/Alertness: Awake/alert Behavior During Therapy: Flat affect Overall Cognitive Status: Impaired/Different from baseline Area of Impairment: Attention, Following commands, Safety/judgement, Awareness, Problem solving, Memory                   Current Attention Level: Sustained Memory: Decreased short-term memory Following Commands: Follows one step commands inconsistently, Follows one step commands with increased time Safety/Judgement: Decreased awareness of deficits Awareness: Intellectual Problem Solving: Slow processing, Decreased initiation, Requires tactile cues, Requires verbal cues General Comments: benefits from calm, encouragement. Pt requiring increased time for responding to cues both in conversation and motor.        Exercises General Exercises - Upper Extremity Shoulder Flexion: AROM, Both, 10 reps, Supine, Theraband Theraband Level (Shoulder Flexion): Level 1 (Yellow) Shoulder Horizontal ABduction: AROM, Both, 10 reps, Supine, Theraband Theraband Level (Shoulder Horizontal Abduction): Level 1 (Yellow) General Exercises - Lower Extremity Ankle Circles/Pumps: PROM, Both, 10 reps, Seated Quad Sets: AROM, Both, 5 reps, Supine    General Comments General comments (skin integrity, edema, etc.): VSS      Pertinent Vitals/Pain Pain Assessment Pain Assessment: Faces Faces Pain Scale: Hurts even more Pain  Location: bil LE Pain Descriptors / Indicators: Grimacing, Guarding, Moaning Pain Intervention(s): Limited activity within patient's tolerance, Monitored during session, Repositioned    Home Living                          Prior Function            PT Goals (current goals can now be found in the care plan section) Acute Rehab PT Goals PT Goal Formulation: With family Time For Goal Achievement: 07/30/21 Potential to Achieve Goals: Fair Progress towards PT goals: Progressing toward goals    Frequency    Min 3X/week      PT Plan Current plan remains appropriate    Co-evaluation PT/OT/SLP Co-Evaluation/Treatment: Yes Reason for Co-Treatment: Complexity of the patient's impairments (multi-system involvement);For patient/therapist safety PT goals addressed during session: Mobility/safety with mobility        AM-PAC PT "6 Clicks" Mobility   Outcome Measure  Help needed turning from your back to your side while in a flat bed without using bedrails?: Total Help needed moving from lying on your back to sitting on the side of a flat bed without using bedrails?: Total Help needed moving to and from a bed to a chair (including a wheelchair)?: Total Help needed standing up from a chair using your arms (e.g., wheelchair or bedside chair)?: Total Help needed to walk in hospital room?: Total Help needed climbing 3-5 steps with a railing? : Total 6 Click Score: 6    End of Session Equipment Utilized During Treatment:  Gait belt Activity Tolerance: Patient limited by fatigue Patient left: with call bell/phone within reach;in bed;with bed alarm set Nurse Communication: Mobility status;Need for lift equipment PT Visit Diagnosis: Other abnormalities of gait and mobility (R26.89);Muscle weakness (generalized) (M62.81);Hemiplegia and hemiparesis Hemiplegia - Right/Left: Right Hemiplegia - dominant/non-dominant: Dominant Hemiplegia - caused by: Cerebral infarction     Time:  4718-5501 PT Time Calculation (min) (ACUTE ONLY): 29 min  Charges:  $Therapeutic Exercise: 8-22 mins                     [facility] M,PT Acute Rehab Services [place] 07/16/2021, 12:55 PM

## 2021-07-16 NOTE — Progress Notes (Signed)
Echo ordered for 08/12/21 per CT surgery request, s/p MVR, follow up with Dr Loletha Carrow 07/28/21 scheduled per CT surgery request.

## 2021-07-16 NOTE — Progress Notes (Signed)
Occupational Therapy Treatment Patient Details Name: Debra Klein MRN: [id number] DOB: [birth year] Today's Date: 07/16/2021   History of present illness Pt is a 46 y.o. female admitted 06/19/2021 with AMS, poor PO intake and recent falls, one resulting in L ankle fx. Workup for severe sepsis, septic arthritis of L knee. MRI 6/8 shows large L MCA infarct. S/p L knee arthroscopy with debridement and synovectomy 6/13. Pt intubated 6/8-6/14. Reintubated for MVR on 6/16; extubated 6/18. Cortrak placed 6/19, pt pulled it out 6/22. PMH includes schizophrenia, HTN, anemia.   OT comments  Patient was found to have had a BM in bed and performed rolling to assist with cleaning and pad change. Patient sat on EOB for gown change with assistance to get BLE off EOB and raise trunk. Patient performed 2 attempts to stand from EOB to RW with max assist +2 with use of pads  and patient unable to clear bed. Patient required total assist of 2 to return to supine due to close to EOB.  Patient instructed in BUE to increase functional strength with mobility and transfers.  Acute OT to continue to follow.    Recommendations for follow up therapy are one component of a multi-disciplinary discharge planning process, led by the attending physician.  Recommendations may be updated based on patient status, additional functional criteria and insurance authorization.    Follow Up Recommendations  [facility] at [facility]    Assistance Recommended at Discharge Frequent or constant Supervision/Assistance  Patient can return home with the following  Two people to help with walking and/or transfers;Two people to help with bathing/dressing/bathroom   Equipment Recommendations  Other (comment) (TBA)    Recommendations for Other Services      Precautions / Restrictions Precautions Precautions: Fall;Sternal Precaution Booklet Issued: No Restrictions Weight Bearing Restrictions: Yes LLE Weight Bearing: Weight bearing  as tolerated Other Position/Activity Restrictions: pt OK to not wear CAM boot per Dr. Ninfa Linden       Mobility Bed Mobility Overal bed mobility: Needs Assistance Bed Mobility: Supine to Sit, Sit to Supine Rolling: +2 for physical assistance, Mod assist, Min assist Sidelying to sit: Min assist, +2 for safety/equipment, HOB elevated Supine to sit: Min assist, +2 for physical assistance, +2 for safety/equipment, HOB elevated Sit to supine: +2 for physical assistance, Total assist   General bed mobility comments: performed rollingin bed for cleaning after BM. Min assist to bring BLE off side of bed and raise trunk.  Total assit of 2 to return to supine    Transfers Overall transfer level: Needs assistance Equipment used: Rolling walker (2 wheels) Transfers: Sit to/from Stand Sit to Stand: Max assist, +2 physical assistance, From elevated surface           General transfer comment: sit to stand performed from EOB with max assist +2 to power up using bed pads to RW.  Patient continues to have difficulty cleaning bed.     Balance Overall balance assessment: Needs assistance Sitting-balance support: No upper extremity supported, Feet supported Sitting balance-Leahy Scale: Fair Sitting balance - Comments: min guard assist to sit on EOB Postural control: Right lateral lean, Posterior lean Standing balance support: Bilateral upper extremity supported, During functional activity Standing balance-Leahy Scale: Zero Standing balance comment: Unable to achieve full stand                           ADL either performed or assessed with clinical judgement   ADL Overall ADL's :  Needs assistance/impaired     Grooming: Wash/dry hands;Wash/dry face;Supervision/safety Grooming Details (indicate cue type and reason): verbal cues to perform     Lower Body Bathing: Total assistance Lower Body Bathing Details (indicate cue type and reason): patient rolling in bed to clean following a  BM Upper Body Dressing : Minimal assistance;Sitting Upper Body Dressing Details (indicate cue type and reason): gown change performed seated on EOB with verbal cues                   General ADL Comments: required frequent cues for encouragement and participation    Extremity/Trunk Assessment              Vision       Perception     Praxis      Cognition Arousal/Alertness: Awake/alert Behavior During Therapy: Flat affect Overall Cognitive Status: Impaired/Different from baseline Area of Impairment: Attention, Following commands, Safety/judgement, Awareness, Problem solving, Memory                   Current Attention Level: Sustained Memory: Decreased short-term memory Following Commands: Follows one step commands inconsistently, Follows one step commands with increased time Safety/Judgement: Decreased awareness of deficits Awareness: Intellectual Problem Solving: Slow processing, Decreased initiation, Requires tactile cues, Requires verbal cues General Comments: required cues for encouragement, often stating, "I can't do that."        Exercises Exercises: General Upper Extremity General Exercises - Upper Extremity Shoulder Flexion: AROM, Both, 10 reps, Supine, Theraband Theraband Level (Shoulder Flexion): Level 1 (Yellow) Shoulder Horizontal ABduction: AROM, Both, 10 reps, Supine, Theraband Theraband Level (Shoulder Horizontal Abduction): Level 1 (Yellow)    Shoulder Instructions       General Comments VSS    Pertinent Vitals/ Pain       Pain Assessment Pain Assessment: Faces Faces Pain Scale: Hurts even more Pain Location: bil LE Pain Descriptors / Indicators: Grimacing, Guarding, Moaning Pain Intervention(s): Limited activity within patient's tolerance, Monitored during session, Repositioned  Home Living                                          Prior Functioning/Environment              Frequency  Min 2X/week         Progress Toward Goals  OT Goals(current goals can now be found in the care plan section)  Progress towards OT goals: Progressing toward goals  Acute Rehab OT Goals Patient Stated Goal: get better OT Goal Formulation: With patient Time For Goal Achievement: 07/24/21 Potential to Achieve Goals: Good ADL Goals Pt Will Perform Grooming: with set-up;with supervision;sitting Pt Will Transfer to Toilet: with mod assist;with +2 assist;stand pivot transfer;bedside commode Additional ADL Goal #1: Pt will follow 2 step commands 50% of time during session Additional ADL Goal #2: Pt will complete bed mobility with Min Guard A in preparation for ADLs  Plan Discharge plan remains appropriate    Co-evaluation    PT/OT/SLP Co-Evaluation/Treatment: Yes Reason for Co-Treatment: Complexity of the patient's impairments (multi-system involvement);For patient/therapist safety PT goals addressed during session: Mobility/safety with mobility OT goals addressed during session: ADL's and self-care      AM-PAC OT "6 Clicks" Daily Activity     Outcome Measure   Help from another person eating meals?: A Little Help from another person taking care of personal grooming?: A Little Help from another  person toileting, which includes using toliet, bedpan, or urinal?: A Lot Help from another person bathing (including washing, rinsing, drying)?: Total Help from another person to put on and taking off regular upper body clothing?: Total Help from another person to put on and taking off regular lower body clothing?: Total 6 Click Score: 11    End of Session Equipment Utilized During Treatment: Gait belt;Rolling walker (2 wheels)  OT Visit Diagnosis: Unsteadiness on feet (R26.81);Muscle weakness (generalized) (M62.81)   Activity Tolerance Patient tolerated treatment well   Patient Left in bed;with call bell/phone within reach;with bed alarm set;with nursing/sitter in room   Nurse Communication  Mobility status        Time: 0321-2248 OT Time Calculation (min): 29 min  Charges: OT General Charges $OT Visit: 1 Visit OT Treatments $Self Care/Home Management : 8-22 mins  Lodema Hong, [place]  Office [place] 07/16/2021, 1:49 PM

## 2021-07-16 NOTE — Progress Notes (Signed)
Mobility Specialist: Progress Note   07/16/21 1558  Mobility  Activity Stood at bedside  Level of Assistance +2 (takes two people)  Assistive Device Four wheel walker (EVA)  Activity Response Tolerated well  $Mobility charge 1 Mobility   Pt received in the bed, reluctant but agreeable to mobility. Pt able to stand x3 with +2 minA-modA. Limited time standing secondary to fatigue. Pt assisted with pericare and then back to bed with call bell at her side. Bed alarm is on.   [facility] Pravin Perezperez Mobility Specialist Mobility Specialist [address]: [phone]

## 2021-07-17 DIAGNOSIS — D62 Acute posthemorrhagic anemia: Secondary | ICD-10-CM | POA: Diagnosis not present

## 2021-07-17 DIAGNOSIS — R4701 Aphasia: Secondary | ICD-10-CM | POA: Diagnosis not present

## 2021-07-17 DIAGNOSIS — R600 Localized edema: Secondary | ICD-10-CM | POA: Diagnosis not present

## 2021-07-17 DIAGNOSIS — I63412 Cerebral infarction due to embolism of left middle cerebral artery: Secondary | ICD-10-CM | POA: Diagnosis not present

## 2021-07-17 DIAGNOSIS — I69398 Other sequelae of cerebral infarction: Secondary | ICD-10-CM | POA: Diagnosis not present

## 2021-07-17 DIAGNOSIS — K5900 Constipation, unspecified: Secondary | ICD-10-CM | POA: Diagnosis not present

## 2021-07-17 DIAGNOSIS — I6930 Unspecified sequelae of cerebral infarction: Secondary | ICD-10-CM | POA: Diagnosis not present

## 2021-07-17 DIAGNOSIS — R7881 Bacteremia: Secondary | ICD-10-CM | POA: Diagnosis not present

## 2021-07-17 DIAGNOSIS — E876 Hypokalemia: Secondary | ICD-10-CM | POA: Diagnosis not present

## 2021-07-17 DIAGNOSIS — I10 Essential (primary) hypertension: Secondary | ICD-10-CM | POA: Diagnosis not present

## 2021-07-17 DIAGNOSIS — Z7401 Bed confinement status: Secondary | ICD-10-CM | POA: Diagnosis not present

## 2021-07-17 DIAGNOSIS — G459 Transient cerebral ischemic attack, unspecified: Secondary | ICD-10-CM | POA: Diagnosis not present

## 2021-07-17 DIAGNOSIS — Z8673 Personal history of transient ischemic attack (TIA), and cerebral infarction without residual deficits: Secondary | ICD-10-CM | POA: Diagnosis not present

## 2021-07-17 DIAGNOSIS — Z4789 Encounter for other orthopedic aftercare: Secondary | ICD-10-CM | POA: Diagnosis not present

## 2021-07-17 DIAGNOSIS — I6310 Cerebral infarction due to embolism of unspecified precerebral artery: Secondary | ICD-10-CM | POA: Diagnosis not present

## 2021-07-17 DIAGNOSIS — M009 Pyogenic arthritis, unspecified: Secondary | ICD-10-CM | POA: Diagnosis not present

## 2021-07-17 DIAGNOSIS — J9600 Acute respiratory failure, unspecified whether with hypoxia or hypercapnia: Secondary | ICD-10-CM | POA: Diagnosis not present

## 2021-07-17 DIAGNOSIS — M79604 Pain in right leg: Secondary | ICD-10-CM | POA: Diagnosis not present

## 2021-07-17 DIAGNOSIS — R2689 Other abnormalities of gait and mobility: Secondary | ICD-10-CM | POA: Diagnosis not present

## 2021-07-17 DIAGNOSIS — M6281 Muscle weakness (generalized): Secondary | ICD-10-CM | POA: Diagnosis not present

## 2021-07-17 DIAGNOSIS — Z741 Need for assistance with personal care: Secondary | ICD-10-CM | POA: Diagnosis not present

## 2021-07-17 DIAGNOSIS — B9561 Methicillin susceptible Staphylococcus aureus infection as the cause of diseases classified elsewhere: Secondary | ICD-10-CM | POA: Diagnosis not present

## 2021-07-17 DIAGNOSIS — R531 Weakness: Secondary | ICD-10-CM | POA: Diagnosis not present

## 2021-07-17 DIAGNOSIS — I4891 Unspecified atrial fibrillation: Secondary | ICD-10-CM | POA: Diagnosis not present

## 2021-07-17 DIAGNOSIS — I890 Lymphedema, not elsewhere classified: Secondary | ICD-10-CM | POA: Diagnosis not present

## 2021-07-17 DIAGNOSIS — R570 Cardiogenic shock: Secondary | ICD-10-CM | POA: Diagnosis not present

## 2021-07-17 DIAGNOSIS — I69351 Hemiplegia and hemiparesis following cerebral infarction affecting right dominant side: Secondary | ICD-10-CM | POA: Diagnosis not present

## 2021-07-17 DIAGNOSIS — G9341 Metabolic encephalopathy: Secondary | ICD-10-CM | POA: Diagnosis not present

## 2021-07-17 DIAGNOSIS — S8991XD Unspecified injury of right lower leg, subsequent encounter: Secondary | ICD-10-CM | POA: Diagnosis not present

## 2021-07-17 DIAGNOSIS — Z471 Aftercare following joint replacement surgery: Secondary | ICD-10-CM | POA: Diagnosis not present

## 2021-07-17 DIAGNOSIS — Z954 Presence of other heart-valve replacement: Secondary | ICD-10-CM | POA: Diagnosis not present

## 2021-07-17 DIAGNOSIS — I330 Acute and subacute infective endocarditis: Secondary | ICD-10-CM | POA: Diagnosis not present

## 2021-07-17 DIAGNOSIS — S82402A Unspecified fracture of shaft of left fibula, initial encounter for closed fracture: Secondary | ICD-10-CM | POA: Diagnosis not present

## 2021-07-17 DIAGNOSIS — R1310 Dysphagia, unspecified: Secondary | ICD-10-CM | POA: Diagnosis not present

## 2021-07-17 DIAGNOSIS — I69354 Hemiplegia and hemiparesis following cerebral infarction affecting left non-dominant side: Secondary | ICD-10-CM | POA: Diagnosis not present

## 2021-07-17 DIAGNOSIS — Z7409 Other reduced mobility: Secondary | ICD-10-CM | POA: Diagnosis not present

## 2021-07-17 DIAGNOSIS — I63512 Cerebral infarction due to unspecified occlusion or stenosis of left middle cerebral artery: Secondary | ICD-10-CM | POA: Diagnosis not present

## 2021-07-17 DIAGNOSIS — M79605 Pain in left leg: Secondary | ICD-10-CM | POA: Diagnosis not present

## 2021-07-17 DIAGNOSIS — R6520 Severe sepsis without septic shock: Secondary | ICD-10-CM | POA: Diagnosis not present

## 2021-07-17 DIAGNOSIS — I059 Rheumatic mitral valve disease, unspecified: Secondary | ICD-10-CM | POA: Diagnosis not present

## 2021-07-17 DIAGNOSIS — I38 Endocarditis, valve unspecified: Secondary | ICD-10-CM | POA: Diagnosis not present

## 2021-07-17 DIAGNOSIS — A4101 Sepsis due to Methicillin susceptible Staphylococcus aureus: Secondary | ICD-10-CM | POA: Diagnosis not present

## 2021-07-17 DIAGNOSIS — S82402D Unspecified fracture of shaft of left fibula, subsequent encounter for closed fracture with routine healing: Secondary | ICD-10-CM | POA: Diagnosis not present

## 2021-07-17 DIAGNOSIS — I69320 Aphasia following cerebral infarction: Secondary | ICD-10-CM | POA: Diagnosis not present

## 2021-07-17 DIAGNOSIS — D649 Anemia, unspecified: Secondary | ICD-10-CM | POA: Diagnosis not present

## 2021-07-17 DIAGNOSIS — R269 Unspecified abnormalities of gait and mobility: Secondary | ICD-10-CM | POA: Diagnosis not present

## 2021-07-17 DIAGNOSIS — Z9181 History of falling: Secondary | ICD-10-CM | POA: Diagnosis not present

## 2021-07-17 DIAGNOSIS — J9601 Acute respiratory failure with hypoxia: Secondary | ICD-10-CM | POA: Diagnosis not present

## 2021-07-17 DIAGNOSIS — M00062 Staphylococcal arthritis, left knee: Secondary | ICD-10-CM | POA: Diagnosis not present

## 2021-07-17 DIAGNOSIS — I480 Paroxysmal atrial fibrillation: Secondary | ICD-10-CM | POA: Diagnosis not present

## 2021-07-17 DIAGNOSIS — I69391 Dysphagia following cerebral infarction: Secondary | ICD-10-CM | POA: Diagnosis not present

## 2021-07-17 LAB — GLUCOSE, CAPILLARY
Glucose-Capillary: 100 mg/dL — ABNORMAL HIGH (ref 70–99)
Glucose-Capillary: 105 mg/dL — ABNORMAL HIGH (ref 70–99)
Glucose-Capillary: 107 mg/dL — ABNORMAL HIGH (ref 70–99)
Glucose-Capillary: 74 mg/dL (ref 70–99)

## 2021-07-17 MED ORDER — NAFCILLIN IV (FOR PTA / DISCHARGE USE ONLY): 12.0000 g | INTRAVENOUS | 0 refills | Status: AC

## 2021-07-17 MED ORDER — OXYCODONE HCL 5 MG PO TABS
5.0000 mg | ORAL_TABLET | ORAL | 0 refills | Status: DC | PRN
Start: 2021-07-17 — End: 2021-11-05

## 2021-07-17 MED ORDER — POTASSIUM CHLORIDE 20 MEQ PO PACK: 40.0000 meq | PACK | Freq: Every day | ORAL | Status: DC

## 2021-07-17 MED ORDER — CYANOCOBALAMIN 1000 MCG PO TABS: 1000.0000 ug | ORAL_TABLET | Freq: Every day | ORAL | Status: AC

## 2021-07-17 MED ORDER — ASPIRIN 81 MG PO CHEW: 81.0000 mg | CHEWABLE_TABLET | Freq: Every day | ORAL | Status: AC

## 2021-07-17 MED ORDER — METOPROLOL TARTRATE 25 MG PO TABS: 12.5000 mg | ORAL_TABLET | Freq: Two times a day (BID) | ORAL | Status: DC

## 2021-07-17 MED ORDER — CLOZAPINE 50 MG PO TABS: 50.0000 mg | ORAL_TABLET | Freq: Every day | ORAL | Status: DC

## 2021-07-17 MED ORDER — PANTOPRAZOLE SODIUM 40 MG PO TBEC: 40.0000 mg | DELAYED_RELEASE_TABLET | Freq: Every day | ORAL | Status: DC

## 2021-07-17 MED ORDER — AMIODARONE HCL 200 MG PO TABS: 200.0000 mg | ORAL_TABLET | Freq: Every day | ORAL | Status: DC

## 2021-07-17 MED ORDER — FLUPHENAZINE HCL 1 MG PO TABS: 1.0000 mg | ORAL_TABLET | Freq: Every day | ORAL | Status: DC

## 2021-07-17 MED ORDER — HEPARIN SOD (PORK) LOCK FLUSH 100 UNIT/ML IV SOLN
250.0000 [IU] | INTRAVENOUS | Status: AC | PRN
  Administered 2021-07-17: 250 [IU]

## 2021-07-17 NOTE — Progress Notes (Signed)
Mobility Specialist: Progress Note   07/17/21 1458  Mobility  Activity Stood at bedside  Level of Assistance +2 (takes two people)  Arts administrator (EVA walker)  Activity Response Tolerated fair  $Mobility charge 1 Mobility   Pt received in the bed, reluctant but agreeable to mobility. C/o LLE pain during session, otherwise asymptomatic. Pt modA +2 to stand for each trial requiring verbal cues for hand placement and physical assist. Pt is in the chair with call bell in reach. Chair alarm is on.   [facility] Charlann Wayne Mobility Specialist Mobility Specialist [address]: [phone]

## 2021-07-17 NOTE — Progress Notes (Signed)
Mobility Specialist: Progress Note   07/17/21 1629  Mobility  Activity Transferred from chair to bed  Level of Assistance +2 (takes two people)  Assistive Device Stedy  Activity Response Tolerated well  $Mobility charge 1 Mobility   Pt assisted back to bed. +2 modA to stand from chair as well as with bed mobility. Pt back in the bed with call bell at her side. Bed alarm is on.   [facility] Arminda Foglio Mobility Specialist Mobility Specialist [address]: [phone]

## 2021-07-17 NOTE — Discharge Summary (Signed)
Physician Discharge Summary  Debra Klein JAS:[id number] DOB: 1975/02/20 DOA: 06/19/2021  PCP: Gaynelle Arabian, MD  Admit date: 06/19/2021 Discharge date: 07/17/2021  Admitted From: Home Disposition:  Inpatient rehab  Discharge Condition:Stable CODE STATUS:FULL Diet recommendation: Dysphagia 1  Brief/Interim Summary:  Patient is a 46 year old female with history of schizophrenia , hypertension, hyperlipidemia who initially presented with acute encephalopathy.  No report of eating or drinking for 2 weeks, not taking medications, having multiple falls and recently sustained fracture on the left ankle, placed on cam boot.  She was found on the floor.  On presentation she was found to have fever, leukocytosis, AKI, elevated LFT, hyperkalemia, lactic acidosis.  Patient was started on IV fluids, empiric antibiotics and was admitted.  Her encephalopathy continued to deteriorate and she was finally intubated and was transferred to [facility] service.  CT head showed insular hypodensity concerning for MCA infarct.  Hospital course also remarkable for finding of bacteremia,mitral valve endocarditis, status post mitral valve repair.  ID was following.  Prolonged hospitalization.  Patient transferred to [facility] service on 6/22.  Currently hemodynamically stable.  PT/OT recommending acute inpatient rehab on discharge.  Medically stable for dc.  She showed follow-up with cardiothoracic surgery and infectious disease as an outpatient.  Following problems were addressed during her hospitalization:  Sepsis/MSSA bacteremia/Staph epidermidis mitral valve endocarditis: ID was following.  Continue current antibiotics.  Afebrile.   Leukocytosis resolved.  Currently on nafcillin, plan to continue through 7/28. Has a picc line on right upper extremity.  She has an appointment with infectious disease on 07/29/2021.   Staph epidermidis mitral valve endocarditis: Cardiothoracic surgery following, status post mitral valve repair.  She  has an appointment with cardiothoracic surgery on 04/12/9377   Acute metabolic encephalopathy: Secondary to sepsis, stroke.  Mental status has significantly improved and is currently alert and oriented.  Still has some degree of dysarthria but significantly better.   left MCA stroke due to septic emboli: Neurology was following.  Continue to monitor mental status.  Has paraplegia.  Continue aspirin   Brief episode of A-fib: Cardiothoracic surgery started on amiodarone,being tapered, can be discontinued on 7/31.  Also on metoprolol.  Currently in normal sinus rhythm.   Acute respiratory failure: Secondary to left-sided atelectasis, left-sided pleural effusion.  Currently on room air.  Encourage incentive spirometry ,flutter valve   Dysphagia: Secondary to stroke.  She was on core track.  Speech therapy following, recommending dysphagia 1 diet, core track taken out.   Cardiogenic shock: Suspected to be from mitral valve regurgitation.  She was on IV diuresis.  She was on inotropes, vasopressors, now discontinued.  Currently blood pressure stable.   Severe hypokalemia: Continue to monitor and supplement   Left knee septic arthritis: MRI of the left knee on 6/10 showed septic arthritis, peritubular osteomyelitis with subcu abscess.  Status post diagnostic arthrocentesis on 6/12.  Cultures showed MSSA.  Status post left knee arthroscopy, I&D, synovectomy.  Continue current antibiotics.  She needs outpatient follow-up with orthopedics   Normocytic anemia: Currently hemoglobin stable.  Continue iron supplementation   Prediabetes: Hemoglobin A1c of 6.1.  Monitor blood sugars  History of schizophrenia: Continue fluphenazine, clozapine   Disposition: PT/OT recommended acute inpatient rehab.     Pressure Injury 06/27/21 Buttocks Posterior;Medial Stage 2 -  Partial thickness loss of dermis presenting as a shallow open injury with a red, pink wound bed without slough. (Active)  06/27/21 1900  Location:  Buttocks  Location Orientation: Posterior;Medial  Staging: Stage 2 -  Partial thickness loss of dermis presenting as a shallow open injury with a red, pink wound bed without slough.  Wound Description (Comments):   Present on Admission: -- (Admit to [place])       Discharge Diagnoses:  Principal Problem:   Sepsis ([place]) Active Problems:   Schizophrenia ([place])   Transaminitis   AKI (acute kidney injury) ([place])   Bacteremia due to methicillin susceptible Staphylococcus aureus (MSSA)   Toxic encephalopathy   Cerebrovascular accident (CVA) due to embolic occlusion of left middle cerebral artery (HCC)   Acute respiratory failure with hypoxia (HCC)   Septic arthritis of knee, left ([place])   S/P mitral valve repair   Pressure injury of skin   Acute bacterial endocarditis    Discharge Instructions  Discharge Instructions     [facility] pharmacist to adjust dose for Vancomycin, Aminoglycosides and other anti-infective therapies as requested by physician.   Complete by: As directed    [facility] to provide Cath Flo 2mg    Complete by: As directed    Administer for PICC line occlusion and as ordered by physician for other access device issues.   Anaphylaxis Kit: Provided to treat any anaphylactic reaction to the medication being provided to the patient if First Dose or when requested by physician   Complete by: As directed    Epinephrine 1mg /ml vial / amp: Administer 0.3mg  (0.67ml) subcutaneously once for moderate to severe anaphylaxis, nurse to call physician and pharmacy when reaction occurs and call 911 if needed for immediate care   Diphenhydramine 50mg /ml IV vial: Administer 25-50mg  IV/IM PRN for first dose reaction, rash, itching, mild reaction, nurse to call physician and pharmacy when reaction occurs   Sodium Chloride 0.9% NS 555ml IV: Administer if needed for hypovolemic blood pressure drop or as ordered by physician after call to physician with anaphylactic reaction    Change dressing on IV access line weekly and PRN   Complete by: As directed    Diet general   Complete by: As directed    Dysphagia 1   Discharge instructions   Complete by: As directed    1)Please take prescribed medications as instructed 2)Follow up with cardiothoracic surgery on 08/12/2021.name and number of the provider group has been attached 3)Follow up with infectious disease on 07/29/21 at 9:30 am.Name and number of the provider has been attached   Discharge wound care:   Complete by: As directed    As per wound care   Flush IV access with Sodium Chloride 0.9% and Heparin 10 units/ml or 100 units/ml   Complete by: As directed    Home infusion instructions - [facility]   Complete by: As directed    Instructions: Flush IV access with Sodium Chloride 0.9% and Heparin 10units/ml or 100units/ml   Change dressing on IV access line: Weekly and PRN   Instructions Cath Flo 2mg : Administer for PICC Line occlusion and as ordered by physician for other access device   [facility] pharmacist to adjust dose for: Vancomycin, Aminoglycosides and other anti-infective therapies as requested by physician   Increase activity slowly   Complete by: As directed    Method of administration may be changed at the discretion of home infusion pharmacist based upon assessment of the patient and/or caregiver's ability to self-administer the medication ordered   Complete by: As directed       Allergies as of 07/17/2021   No Known Allergies      Medication List  STOP taking these medications    ibuprofen 200 MG tablet Commonly known as: ADVIL   traZODone 50 MG tablet Commonly known as: DESYREL       TAKE these medications    amiodarone 200 MG tablet Commonly known as: PACERONE Take 1 tablet (200 mg total) by mouth daily for 24 days. Start taking on: July 18, 2021   aspirin 81 MG chewable tablet Chew 1 tablet (81 mg total) by mouth daily. Start taking on: July 18, 2021   atorvastatin 20 MG tablet Commonly known as: LIPITOR Take 20 mg by mouth daily.   benztropine 1 MG tablet Commonly known as: COGENTIN Take 1 mg by mouth at bedtime.   clozapine 50 MG tablet Commonly known as: CLOZARIL Take 1 tablet (50 mg total) by mouth at bedtime. What changed:  medication strength how much to take   cyanocobalamin 1000 MCG tablet Take 1 tablet (1,000 mcg total) by mouth daily. Start taking on: July 18, 2021   fluPHENAZine 1 MG tablet Commonly known as: PROLIXIN Take 1 tablet (1 mg total) by mouth at bedtime. What changed:  medication strength how much to take   iron polysaccharides 150 MG capsule Commonly known as: NIFEREX Take 150 mg by mouth 2 (two) times daily.   meloxicam 15 MG tablet Commonly known as: MOBIC TAKE 1 TABLET(15 MG) BY MOUTH DAILY AS NEEDED FOR PAIN What changed: See the new instructions.   metoprolol tartrate 25 MG tablet Commonly known as: LOPRESSOR Take 0.5 tablets (12.5 mg total) by mouth 2 (two) times daily.   nafcillin  IVPB Inject 12 g into the vein daily for 22 days. Indication:  MSSA endocarditis w/ septic emboli First Dose: Yes Last Day of Therapy:  08/08/21 Labs - Once weekly:  CBC/D and BMP, Labs - Every other week:  ESR and CRP Method of administration: Elastomeric (Continuous infusion) Method of administration may be changed at the discretion of home infusion pharmacist based upon assessment of the patient and/or caregiver's ability to self-administer the medication ordered.   oxyCODONE 5 MG immediate release tablet Commonly known as: Oxy IR/ROXICODONE Place 1-2 tablets (5-10 mg total) into feeding tube every 3 (three) hours as needed for severe pain.   pantoprazole 40 MG tablet Commonly known as: PROTONIX Take 1 tablet (40 mg total) by mouth daily. Start taking on: July 18, 2021   potassium chloride 20 MEQ packet Commonly known as: KLOR-CON Take 40 mEq by mouth daily. Start taking on: July 18, 2021                Discharge Care Instructions  (From admission, onward)           Start     Ordered   07/17/21 0000  Change dressing on IV access line weekly and PRN  (Home infusion instructions - [facility] )        07/17/21 1523   07/17/21 0000  Discharge wound care:       Comments: As per wound care   07/17/21 1523            Follow-up Information     Melrose Nakayama, MD. Go on 08/12/2021.   Specialty: Cardiothoracic Surgery Why: PA/LAT CXR (to be taken at [facility] which is in the same building as Dr. Leonarda Salon office) on 08/01 at 8:45 am;Appointment time is at 9:15 am Contact information: [place] [address] [phone]         [facility] Follow up.   Specialty: Cardiology Contact information: [address] [id number] Danice Goltz [place] [phone]        Laurice Record, MD Follow up.   Specialty: Infectious Diseases Why: appointment on 07/29/21 at 9;30 am Contact information: [address], [place] [address] [phone]                No Known Allergies  Consultations: PCCM, cardiothoracic surgery, ID, orthopedics   Procedures/Studies: DG Swallowing Func-Speech Pathology  Result Date: 07/16/2021 Table formatting from the original result was not included. Objective Swallowing Evaluation: Type of Study: MBS-Modified Barium Swallow Study  Patient Details Name: Eustolia Drennen MRN: [id number] Date of Birth: 1975/10/03 Today's Date: 07/16/2021 Time: SLP Start Time (ACUTE ONLY): 1055 -SLP Stop Time (ACUTE ONLY): 1115 SLP Time Calculation (min) (ACUTE ONLY): 20 min Past Medical History: Past Medical History: Diagnosis Date  Hyperlipemia   Hypertension   IDA (iron deficiency anemia)   Schizophrenia ([place])  Past Surgical History: Past Surgical History: Procedure Laterality Date  KNEE ARTHROSCOPY Left 06/24/2021  Procedure:  ARTHROSCOPY LEFT KNEE IRRIGRATION AND [place] AND SYNOVECTOMY;  Surgeon: Mcarthur Rossetti, MD;  Location: [place];  Service: Orthopedics;  Laterality: Left;  MITRAL VALVE REPAIR N/A 06/27/2021  Procedure: MITRAL VALVE REPAIR USING 28MM SIMUFORM SEMI-RIGID ANNULOPLASTY RING;  Surgeon: Melrose Nakayama, MD;  Location: [place];  Service: Open Heart Surgery;  Laterality: N/A;  No previous surgery    TEE WITHOUT CARDIOVERSION N/A 06/27/2021  Procedure: TRANSESOPHAGEAL ECHOCARDIOGRAM (TEE);  Surgeon: Melrose Nakayama, MD;  Location: [place];  Service: Open Heart Surgery;  Laterality: N/A; HPI: Patient is a 46 y.o. female with PMH: HTN, HLD, iron deficiency, anemia, schizophrenia who presented to the hospital on 06/17/21 to ED for evaluation of AMS. Patient initially non-verbal but awake and alert in ED. Mother reported patient as ambulatory and verbal at baseline but had not been eating or drinking for past two weeks (also not taking medications). She has had multiple falls, seen by orthopedic day before admission and questioned possible fracture left ankle and placed in Cam boot. In ED, patient admitted with severe sepsis of unknown source; MRI showed large Left MCA infarct. Patient required intubation on 6/8-6/15/23 and was intubated for surgical procedure (mitral valve repair) on 6/16, extubated 6/18. Cortrak 6/19.  Subjective: cooperative  Recommendations for follow up therapy are one component of a multi-disciplinary discharge planning process, led by the attending physician.  Recommendations may be updated based on patient status, additional functional criteria and insurance authorization. Assessment / Plan / Recommendation   07/16/2021  12:11 PM Clinical Impressions Clinical Impression Patient exhibits improved swallow function as compared to MBS on 07/03/21. During today's test, oral phase was [facility] with nectar thick and thin liquids and mildly delayed with puree solids with barium tablet. She refused all  other solids offered. Swallow initiation delay at level of vallecular sinus was observed with nectar thick liquids and with thin liquids, swallow was initiated at level of pyriform sinus. No penetration or aspiration observed with nectar thick liquids. Patient did exhibit flash penetration (PAS 2) with thin liquids but penetrate was well above vocal cords and fully exited laryngeal vestibule without difficulty. No aspiration observed even when patient taking large successive straw sips with thin liquids. SLP recommending upgrade liquids to thin and SLP will follow for toleration and ability to upgrade solids at bedside. SLP Visit Diagnosis Dysphagia, unspecified (R13.10) Impact on safety and function  Mild aspiration risk     07/16/2021  12:11 PM Treatment Recommendations Treatment Recommendations Therapy as outlined in treatment plan below     07/16/2021  12:21 PM Prognosis Prognosis for Safe Diet Advancement Good Barriers to Reach Goals Severity of deficits;Cognitive deficits   07/16/2021  12:11 PM Diet Recommendations SLP Diet Recommendations Dysphagia 1 (Puree) solids;Thin liquid Liquid Administration via Cup;Straw Medication Administration Crushed with puree Compensations Slow rate;Small sips/bites;Lingual sweep for clearance of pocketing;Minimize environmental distractions Postural Changes Seated upright at 90 degrees     07/16/2021  12:11 PM Other Recommendations Oral Care Recommendations Oral care BID Follow Up Recommendations Acute inpatient rehab (3hours/day) Assistance recommended at discharge Frequent or constant Supervision/Assistance Functional Status Assessment Patient has had a recent decline in their functional status and demonstrates the ability to make significant improvements in function in a reasonable and predictable amount of time.   07/16/2021  12:11 PM Frequency and Duration  Speech Therapy Frequency (ACUTE ONLY) min 2x/week Treatment Duration 2 weeks     07/16/2021  12:04 PM Oral Phase Oral Phase  Impaired Oral - Nectar Teaspoon NT Oral - Nectar Cup WFL Oral - Nectar Straw NT Oral - Thin Cup WFL Oral - Thin Straw WFL Oral - Puree WFL Oral - Regular NT Oral - Pill Decreased bolus cohesion;Reduced posterior propulsion    07/16/2021  12:06 PM Pharyngeal Phase Pharyngeal Phase Impaired Pharyngeal- Nectar Teaspoon NT Pharyngeal- Nectar Cup Delayed swallow initiation-vallecula Pharyngeal- Nectar Straw NT Pharyngeal- Thin Cup Delayed swallow initiation-pyriform sinuses;Penetration/Aspiration during swallow Pharyngeal Material enters airway, remains ABOVE vocal cords then ejected out Pharyngeal- Thin Straw Delayed swallow initiation-pyriform sinuses;Penetration/Aspiration during swallow Pharyngeal Material enters airway, remains ABOVE vocal cords then ejected out Pharyngeal- Puree Delayed swallow initiation-vallecula Pharyngeal- Mechanical Soft NT Pharyngeal- Pill [facility]    07/16/2021  12:10 PM Cervical Esophageal Phase  Cervical Esophageal Phase Impaired Nectar Cup Esophageal backflow into cervical esophagus Sonia Baller, MA, CCC-SLP Speech Therapy                     DG Chest Port 1 View  Result Date: 07/07/2021 CLINICAL DATA:  Atelectasis. EXAM: PORTABLE CHEST 1 VIEW COMPARISON:  Chest radiograph 07/02/2021 FINDINGS: Sequelae of mitral valve repair are again identified. Right jugular sheath and enteric tube have been removed. A right upper extremity PICC has been placed and terminates near the superior cavoatrial junction. The cardiac silhouette is mildly enlarged. Mild interstitial densities bilaterally have mildly increased, and there is likely a persistent small right pleural effusion. No pneumothorax is identified. IMPRESSION: 1. Support devices, including new PICC, as above. 2. Increased, mild interstitial densities likely reflecting edema. 3. Suspected small right pleural effusion. Electronically Signed   By: Logan Bores M.D.   On: 07/07/2021 08:55   DG Swallowing Func-Speech Pathology  Result Date:  07/03/2021 Table formatting from the original result was not included. Images from the original result were not included. Objective Swallowing Evaluation: Type of Study: MBS-Modified Barium Swallow Study  Patient Details Name: Rea Kalama MRN: [id number] Date of Birth: 1975/04/08 Today's Date: 07/03/2021 Time: SLP Start Time (ACUTE ONLY): 1000 -SLP Stop Time (ACUTE ONLY): 1030 SLP Time Calculation (min) (ACUTE ONLY): 30 min Past Medical History: Past Medical History: Diagnosis Date  Hyperlipemia   Hypertension   IDA (iron deficiency anemia)   Schizophrenia ([place])  Past Surgical History: Past Surgical History: Procedure Laterality Date  KNEE ARTHROSCOPY Left 06/24/2021  Procedure: ARTHROSCOPY LEFT KNEE IRRIGRATION AND [place] AND SYNOVECTOMY;  Surgeon: Mcarthur Rossetti,  MD;  Location: [place];  Service: Orthopedics;  Laterality: Left;  MITRAL VALVE REPAIR N/A 06/27/2021  Procedure: MITRAL VALVE REPAIR USING 28MM SIMUFORM SEMI-RIGID ANNULOPLASTY RING;  Surgeon: Melrose Nakayama, MD;  Location: [place];  Service: Open Heart Surgery;  Laterality: N/A;  No previous surgery    TEE WITHOUT CARDIOVERSION N/A 06/27/2021  Procedure: TRANSESOPHAGEAL ECHOCARDIOGRAM (TEE);  Surgeon: Melrose Nakayama, MD;  Location: [place];  Service: Open Heart Surgery;  Laterality: N/A; HPI: Patient is a 46 y.o. female with PMH: HTN, HLD, iron deficiency, anemia, schizophrenia who presented to the hospital on 06/17/21 to ED for evaluation of AMS. Patient initially non-verbal but awake and alert in ED. Mother reported patient as ambulatory and verbal at baseline but had not been eating or drinking for past two weeks (also not taking medications). She has had multiple falls, seen by orthopedic day before admission and questioned possible fracture left ankle and placed in Cam boot. In ED, patient admitted with severe sepsis of unknown source; MRI showed large Left MCA infarct. Patient required intubation on 6/8-6/15/23 and was intubated  for surgical procedure (mitral valve repair) on 6/16, extubated 6/18. Cortrak 6/19.  Subjective: alert  Recommendations for follow up therapy are one component of a multi-disciplinary discharge planning process, led by the attending physician.  Recommendations may be updated based on patient status, additional functional criteria and insurance authorization. Assessment / Plan / Recommendation   07/03/2021  10:00 AM Clinical Impressions Clinical Impression Pt presents with a moderate oral dyspahgia due to decreased motor planning including groping behaviors with more complex oral intake such as sipping from a straw or sealing lips to cup edge at times. Mastication somewhat slow and labored in these intial trials, though no singificant oral residue present and eventual upgrade to regualr solids anticipated. Pt is very impulsive and will drink consecutively from straw with late airway protection with thin liquids. Pt has cough without ejection when drinking thins. A chin tuck appears to help, but total assist needed for position. This may be helpful for future therapeutic efforts. When drinking nectar thick liquids there is still penetration of nectar to the vestibule with ejection. At times mild penetrate remains that mixes with secretions and sits on glottis. Pt uses a throat clear and ejects most penetrate though trace aspiration did occur x1. Recommend pt initiate a dys 2/nectar thick diet with expectation of improvement as secretion management is more automatic. WIll f/u for tolerance.   SLP Visit Diagnosis Dysphagia, oropharyngeal phase (R13.12) Impact on safety and function Mild aspiration risk     07/03/2021  10:00 AM Treatment Recommendations Treatment Recommendations Therapy as outlined in treatment plan below     07/03/2021  10:00 AM Prognosis Prognosis for Safe Diet Advancement Good   07/03/2021  10:00 AM Diet Recommendations SLP Diet Recommendations Dysphagia 2 (Fine [place]) solids;Nectar thick liquid Liquid  Administration via Cup;Straw Medication Administration Crushed with puree Compensations Slow rate;Small sips/bites Postural Changes Seated upright at 90 degrees     07/03/2021  10:00 AM Other Recommendations Oral Care Recommendations Oral care BID Follow Up Recommendations Skilled nursing-short term rehab (<3 hours/day) Functional Status Assessment Patient has had a recent decline in their functional status and demonstrates the ability to make significant improvements in function in a reasonable and predictable amount of time.   07/03/2021  10:00 AM Frequency and Duration  Speech Therapy Frequency (ACUTE ONLY) min 2x/week Treatment Duration 2 weeks     07/03/2021  10:00 AM Oral Phase Oral Phase  Impaired Oral - Nectar Teaspoon WFL Oral - Nectar Cup WFL Oral - Nectar Straw Other (Comment) Oral - Thin Cup [facility] Oral - Thin Straw WFL Oral - Puree WFL Oral - Regular Decreased bolus cohesion;Delayed oral transit;Lingual/palatal residue    07/03/2021  10:00 AM Pharyngeal Phase Pharyngeal Phase Impaired Pharyngeal- Nectar Teaspoon Delayed swallow initiation-pyriform sinuses Pharyngeal- Nectar Cup Delayed swallow initiation-pyriform sinuses Pharyngeal- Nectar Straw Delayed swallow initiation-pyriform sinuses;Penetration/Apiration after swallow;Penetration/Aspiration during swallow;Penetration/Aspiration before swallow Pharyngeal Material enters airway, remains ABOVE vocal cords then ejected out;Material enters airway, remains ABOVE vocal cords and not ejected out;Material enters airway, passes BELOW cords without attempt by patient to eject out (silent aspiration);Material enters airway, passes BELOW cords then ejected out Pharyngeal- Thin Cup Penetration/Aspiration before swallow;Delayed swallow initiation-pyriform sinuses Pharyngeal Material enters airway, passes BELOW cords and not ejected out despite cough attempt by patient Pharyngeal- Thin Straw Penetration/Aspiration before swallow Pharyngeal Material enters airway, passes  BELOW cords and not ejected out despite cough attempt by patient Pharyngeal- Puree Delayed swallow initiation-vallecula Pharyngeal- Mechanical Soft Delayed swallow initiation-vallecula     No data to display    DeBlois, Katherene Ponto 07/03/2021, 11:39 AM                     ECHO INTRAOPERATIVE TEE  Result Date: 07/03/2021  *INTRAOPERATIVE TRANSESOPHAGEAL REPORT *  Patient Name:   JACEE ENERSON Date of Exam: 06/27/2021 Medical Rec #:  [id number]      Height:       66.0 in Accession #:    [id number]     Weight:       198.9 lb Date of Birth:  January 14, 1975      BSA:          1.99 m Patient Age:    3 years       BP:           135/86 mmHg Patient Gender: F              HR:           83 bpm. Exam Location:  Inpatient Transesophogeal exam was perform intraoperatively during surgical procedure. Patient was closely monitored under general anesthesia during the entirety of examination. Indications:     mitral valve vegitation Performing Phys: 79 STEVEN C HENDRICKSON Diagnosing Phys: Albertha Ghee MD Complications: No known complications during this procedure. POST-OP IMPRESSIONS _ Left Ventricle: The left ventricle is unchanged from pre-bypass. _ Right Ventricle: The right ventricle appears unchanged from pre-bypass. _ Aortic Valve: The aortic valve appears unchanged from pre-bypass. _ Mitral Valve: No stenosis present. Mean PG 3 mmHg accross the mitral valve. There is a broad mild to moderate regurgitant jet. . The mitral valve is status post repair with an annuloplasty ring. The vegetation is no longer present. _ Tricuspid Valve: The tricuspid valve appears unchanged from pre-bypass. PRE-OP FINDINGS  Left Ventricle: The left ventricle has normal systolic function, with an ejection fraction of 60-65%. The cavity size was normal. There is no increase in left ventricular wall thickness. There is no left ventricular hypertrophy. Right Ventricle: The right ventricle has normal systolic function. The cavity was normal. There  is no increase in right ventricular wall thickness. Left Atrium: Left atrial size was normal in size. No left atrial/left atrial appendage thrombus was detected. Right Atrium: Right atrial size was normal in size. Interatrial Septum: No atrial level shunt detected by color flow Doppler. Pericardium: There is no evidence of pericardial effusion. Mitral Valve: The mitral valve  is normal in structure. Mitral valve regurgitation is mild by color flow Doppler. There is no evidence of mitral stenosis. There is a moderate mobile mitral valve vegetation present on the P1 and P2 cusps that measures 15 mm x 8 mm in size. Vegetation seen on the atrial side of the posterior mitral valve leaflet in the region of P1/P2. There is a mild eccentric regurgitant jet eminating near the vegetation. Tricuspid Valve: The tricuspid valve was normal in structure. Tricuspid valve regurgitation is trivial by color flow Doppler. Aortic Valve: The aortic valve is tricuspid Aortic valve regurgitation was not visualized by color flow Doppler. There is no stenosis of the aortic valve. Pulmonic Valve: The pulmonic valve was normal in structure. Pulmonic valve regurgitation is not visualized by color flow Doppler. Aorta: The aortic root, ascending aorta and aortic arch are normal in size and structure.  Albertha Ghee MD Electronically signed by Albertha Ghee MD Signature Date/Time: 07/03/2021/8:50:36 AM    Final    DG Chest Port 1 View  Result Date: 07/02/2021 CLINICAL DATA:  Status post mitral valve repair EXAM: PORTABLE CHEST 1 VIEW COMPARISON:  Chest x-ray dated July 01, 2021 FINDINGS: Stable position of feeding tube and right IJ sheath. Cardiac and mediastinal contours are unchanged post median sternotomy. Mild bilateral interstitial opacities, likely due to pulmonary edema. Bibasilar atelectasis. Possible trace right pleural effusion. No evidence of pneumothorax. IMPRESSION: 1. New mild bilateral interstitial opacities, likely due to  pulmonary edema. 2. Possible trace right pleural effusion. Electronically Signed   By: Yetta Glassman M.D.   On: 07/02/2021 08:28   [place] EKG SITE RITE  Result Date: 07/02/2021 If Site Rite image not attached, placement could not be confirmed due to current cardiac rhythm.  DG Chest Port 1 View  Result Date: 07/01/2021 CLINICAL DATA:  AMS.  Sepsis. EXAM: PORTABLE CHEST 1 VIEW COMPARISON:  Chest XR, most recently 06/30/2021. FINDINGS: Support lines: RIGHT IJ sheath. Enteric feeding tube with tip outside the field of view. Overlying cutaneous leads. Cardiomediastinal silhouette is unchanged in within normal limits. Aortic valvuloplasty. Similar degree of inflation with bibasilar linear and patchy perihilar opacities. No new or additional consolidation. No large pleural effusion or pneumothorax. Median sternotomy. No interval osseous abnormality. IMPRESSION: 1. Lines and tubes as above 2. Unchanged pulmonary findings, with trace bibasilar atelectasis. Electronically Signed   By: Michaelle Birks M.D.   On: 07/01/2021 08:32   DG Abd Portable 1V  Result Date: 06/30/2021 CLINICAL DATA:  Feeding tube placement EXAM: PORTABLE ABDOMEN - 1 VIEW COMPARISON:  Portable exam 0955 hours compared to 06/28/2021 FINDINGS: Tip of feeding tube projects over duodenal bulb. Post MVR. Gaseous distention of colon, mild. Bibasilar atelectasis. No acute osseous findings. IMPRESSION: Tip of feeding tube projects over duodenal bulb. Electronically Signed   By: Lavonia Dana M.D.   On: 06/30/2021 10:12   DG Chest Port 1 View  Result Date: 06/30/2021 CLINICAL DATA:  Altered mental status.  Sepsis.  Status post MVR. EXAM: PORTABLE CHEST 1 VIEW COMPARISON:  06/29/2021 FINDINGS: Status post median sternotomy and aortic valve replacement. RIGHT-sided Swan-Ganz catheter has been removed. RIGHT IJ sheath remains in place. LEFT-sided chest tube has been removed. Nasogastric tube and mediastinal drain also removed. There is no pneumothorax.  Focal areas of atelectasis seen at both lung bases. Possible small RIGHT pleural effusion versus atelectasis. No evidence for pulmonary edema. IMPRESSION: Bibasilar atelectasis. Small RIGHT pleural effusion versus atelectasis. Electronically Signed   By: Nolon Nations M.D.   On:  06/30/2021 08:00   DG Chest Port 1 View  Result Date: 06/29/2021 CLINICAL DATA:  Respiratory failure. EXAM: PORTABLE CHEST 1 VIEW COMPARISON:  06/28/2021 FINDINGS: Enteric tube courses into the stomach and off the film as tip is not visualized. Endotracheal tube unchanged. Right IJ Swan-Ganz catheter has tip over the main pulmonary artery segment. Mediastinal drain and bilateral chest tubes unchanged. Lungs are hypoinflated with improving mild bibasilar atelectasis. No effusion or pneumothorax. Cardiomediastinal silhouette and remainder of the exam is unchanged. IMPRESSION: 1. Hypoinflation with improving mild bibasilar atelectasis. 2. Tubes and lines as described. Electronically Signed   By: Marin Olp M.D.   On: 06/29/2021 08:47   DG Abd 1 View  Result Date: 06/28/2021 CLINICAL DATA:  OG tube placement EXAM: ABDOMEN - 1 VIEW COMPARISON:  Radiographs dated June 25, 2021 FINDINGS: OG tube tip and side port project over the expected area of the stomach. No gas-filled dilated loops of bowel seen in the visualized abdomen. Partially visualized lung bases demonstrate a PA catheter and left-sided chest tube. IMPRESSION: OG tube tip and side port project over the expected area of the stomach. Electronically Signed   By: Yetta Glassman M.D.   On: 06/28/2021 12:24   DG Chest Port 1 View  Result Date: 06/28/2021 CLINICAL DATA:  Post mitral valve repair. EXAM: PORTABLE CHEST 1 VIEW COMPARISON:  06/27/2021 FINDINGS: Patient is rotated to the left. Sternotomy wires intact. Right IJ Swan-Ganz catheter unchanged with tip over the main pulmonary artery segment. Enteric tube courses into the region of the stomach and off the film as tip  is not visualized. Endotracheal tube has tip approximately 4 cm above the carina. Bilateral chest tubes and mediastinal drain unchanged. Lungs are hypoinflated with slight worsening bibasilar opacification likely atelectasis. Possible small amount left pleural fluid. No pneumothorax. Evidence of patient's mitral valve repair. Cardiomediastinal silhouette and remainder of the exam is unchanged. IMPRESSION: 1. Hypoinflation with slight worsening bibasilar opacification likely atelectasis with possible small amount left pleural fluid. 2. Tubes and lines as described. Electronically Signed   By: Marin Olp M.D.   On: 06/28/2021 08:30   DG CHEST PORT 1 VIEW  Result Date: 06/27/2021 CLINICAL DATA:  None chest tube in place. Just mint of endotracheal tube. EXAM: PORTABLE CHEST 1 VIEW COMPARISON:  One view chest x-ray from 06/27/21 at 3:35 p.m. FINDINGS: Heart is enlarged. Swan-Ganz catheter is stable. A new left-sided chest tube is in place. Pigtail catheter projects over the left hemithorax. The left pleural effusion is slightly decreased. Right-sided chest tube is stable. No pneumothorax is present. Lung volumes have decreased bilaterally. Endotracheal tube is been pulled back. It is now in the trachea, terminating less than 2 cm from the carina. It could be pulled back 1-2 cm for more optimal positioning. IMPRESSION: 1. Interval placement of left-sided chest tube with decrease in left pleural effusion. 2. Stable right-sided chest tube without pneumothorax. 3. Endotracheal tube is been pulled back. It is now in the trachea, terminating less than 2 cm from the carina. Electronically Signed   By: San Morelle M.D.   On: 06/27/2021 17:01   DG Chest Port 1 View  Result Date: 06/27/2021 CLINICAL DATA:  Status post mitral valve repair. EXAM: PORTABLE CHEST 1 VIEW COMPARISON:  Chest radiograph 06/24/2021 FINDINGS: Sequelae of interval mitral valve repair are identified. The endotracheal tube has been advanced  and now terminates in the region of the right mainstem bronchus orifice. Enteric tube courses into the abdomen with tip not  imaged. A new right jugular Swan-Ganz catheter terminates over the main pulmonary artery. A right chest tube and mediastinal drain are in place. Opacity is present throughout most of the left lung with evidence of volume loss, and new pleural fluid is present in the left lung base and tracks laterally to the apex. No pneumothorax is identified. IMPRESSION: 1. Interval advancement of endotracheal tube into the region of the right mainstem bronchus orifice. Recommend retracting 4 cm. 2. Left lung collapse with new left-sided pleural effusion or possible hemothorax. These results will be called to the ordering clinician or representative by the Radiologist Assistant, and communication documented in the PACS or [employer]. Electronically Signed   By: Logan Bores M.D.   On: 06/27/2021 16:11   EEG adult  Result Date: 06/27/2021 Lora Havens, MD     06/27/2021  8:22 AM Patient Name: Raegen Tarpley MRN: [id number] Epilepsy Attending: Lora Havens Referring Physician/Provider: Rosalin Hawking, MD Date: 06/27/2021 Duration: 25.51 mins  Patient history:  46 year old woman with past medical history of schizophrenia, hypertension, hyperlipidemia, presenting with fever, altered mental status, and found to have multifocal strokes, with worsening mental status.  EEG to evaluate for seizure.  Level of alertness: awake  AEDs during EEG study: None  Technical aspects: This EEG study was done with scalp electrodes positioned according to the 10-20 International system of electrode placement. Electrical activity was acquired at a sampling rate of 500Hz  and reviewed with a high frequency filter of 70Hz  and a low frequency filter of 1Hz . EEG data were recorded continuously and digitally stored.  Description: EEG showed continuous generalized 3-6Hz  theta-delta slowing. Hyperventilation and photic  stimulation were not performed.    ABNORMALITY - Continuous slow, generalized  IMPRESSION: This study is suggestive of moderate diffuse encephalopathy, nonspecific etiology. No seizures or epileptiform discharges were seen throughout the recording.  Lora Havens   MR BRAIN WO CONTRAST  Result Date: 06/26/2021 CLINICAL DATA:  Follow-up examination for stroke, altered mental status. EXAM: MRI HEAD WITHOUT CONTRAST MRA HEAD WITHOUT CONTRAST TECHNIQUE: Multiplanar, multi-echo pulse sequences of the brain and surrounding structures were acquired without intravenous contrast. Angiographic images of the Circle of Willis were acquired using MRA technique without intravenous contrast. COMPARISON:  Prior studies from 06/20/2021 and earlier. FINDINGS: MRI HEAD FINDINGS Brain: Examination moderately degraded by motion artifact. There has been continued interval evolution of previously identified left MCA distribution infarct, overall similar in size and distribution as compared to previous MRI. No evidence for significant interval expansion. No evidence for hemorrhagic transformation or significant regional mass effect. Additional previously seen smaller scattered infarcts involving the left parieto-occipital region and contralateral right cerebral hemisphere are now no longer clearly visualized. No other new infarct identified. Gray-white matter differentiation otherwise maintained. No acute or chronic intracranial hemorrhage elsewhere within the brain. Curvilinear focus of susceptibility artifact at the left sylvian fissure felt to be most consistent with thrombus within a distal left MCA branch as seen on prior CTA. Underlying cerebral volume within normal limits. No mass lesion or midline shift. No hydrocephalus or extra-axial fluid collection. Pituitary gland suprasellar region within normal limits. Vascular: Major intracranial vascular flow voids are maintained. Skull and upper cervical spine: Craniocervical  junction normal. No focal marrow replacing lesion. No scalp soft tissue abnormality. Sinuses/Orbits: Right gaze noted. Scattered mucosal thickening noted about the ethmoidal air cells and maxillary sinuses with a few small maxillary sinus retention cyst. Mastoids remain clear. Other: None. MRA HEAD FINDINGS Anterior circulation: Examination moderately degraded  by motion artifact. Both internal carotid arteries remain patent to the termini without visible stenosis or other abnormality. A1 segments, anterior communicating artery complex, and anterior cerebral arteries remain patent without appreciable stenosis. M1 segments remain patent. Distal right MCA branches perfused and patent. Previously seen left M2 occlusion not well seen on this motion degraded exam, although is likely still present given the susceptibility artifact on corresponding brain MRI. Overall, left MCA branches are attenuated as compared to the right. Posterior circulation: Both vertebral arteries remain patent to the vertebrobasilar junction without stenosis. Both PICA patent. Basilar patent to its distal aspect without stenosis. Superior cerebellar arteries and posterior cerebral arteries remain patent bilaterally. No visible aneurysm. Anatomic variants: None significant. IMPRESSION: MRI HEAD IMPRESSION: 1. Motion degraded exam. 2. Continued interval evolution of previously identified left MCA distribution infarct, overall similar in size and distribution as compared to previous MRI without significant interval expansion. No evidence for hemorrhagic transformation or significant regional mass effect. 3. Additional previously seen small volume ischemic infarcts involving the left parieto-occipital region and contralateral right cerebral hemisphere now no longer visualized. 4. No other new acute intracranial abnormality. MRA HEAD IMPRESSION: 1. Technically limited exam due to motion artifact. 2. Persistent occlusive thrombus within a left M2 branch  as evidenced by susceptibility artifact at the left sylvian fissure on brain MRI. Overall, appearance is similar to prior CTA. 3. Otherwise grossly stable intracranial MRA. No other new or progressive findings. Electronically Signed   By: Jeannine Boga M.D.   On: 06/26/2021 19:11   MR ANGIO HEAD WO CONTRAST  Result Date: 06/26/2021 CLINICAL DATA:  Follow-up examination for stroke, altered mental status. EXAM: MRI HEAD WITHOUT CONTRAST MRA HEAD WITHOUT CONTRAST TECHNIQUE: Multiplanar, multi-echo pulse sequences of the brain and surrounding structures were acquired without intravenous contrast. Angiographic images of the Circle of Willis were acquired using MRA technique without intravenous contrast. COMPARISON:  Prior studies from 06/20/2021 and earlier. FINDINGS: MRI HEAD FINDINGS Brain: Examination moderately degraded by motion artifact. There has been continued interval evolution of previously identified left MCA distribution infarct, overall similar in size and distribution as compared to previous MRI. No evidence for significant interval expansion. No evidence for hemorrhagic transformation or significant regional mass effect. Additional previously seen smaller scattered infarcts involving the left parieto-occipital region and contralateral right cerebral hemisphere are now no longer clearly visualized. No other new infarct identified. Gray-white matter differentiation otherwise maintained. No acute or chronic intracranial hemorrhage elsewhere within the brain. Curvilinear focus of susceptibility artifact at the left sylvian fissure felt to be most consistent with thrombus within a distal left MCA branch as seen on prior CTA. Underlying cerebral volume within normal limits. No mass lesion or midline shift. No hydrocephalus or extra-axial fluid collection. Pituitary gland suprasellar region within normal limits. Vascular: Major intracranial vascular flow voids are maintained. Skull and upper cervical  spine: Craniocervical junction normal. No focal marrow replacing lesion. No scalp soft tissue abnormality. Sinuses/Orbits: Right gaze noted. Scattered mucosal thickening noted about the ethmoidal air cells and maxillary sinuses with a few small maxillary sinus retention cyst. Mastoids remain clear. Other: None. MRA HEAD FINDINGS Anterior circulation: Examination moderately degraded by motion artifact. Both internal carotid arteries remain patent to the termini without visible stenosis or other abnormality. A1 segments, anterior communicating artery complex, and anterior cerebral arteries remain patent without appreciable stenosis. M1 segments remain patent. Distal right MCA branches perfused and patent. Previously seen left M2 occlusion not well seen on this motion degraded exam, although is  likely still present given the susceptibility artifact on corresponding brain MRI. Overall, left MCA branches are attenuated as compared to the right. Posterior circulation: Both vertebral arteries remain patent to the vertebrobasilar junction without stenosis. Both PICA patent. Basilar patent to its distal aspect without stenosis. Superior cerebellar arteries and posterior cerebral arteries remain patent bilaterally. No visible aneurysm. Anatomic variants: None significant. IMPRESSION: MRI HEAD IMPRESSION: 1. Motion degraded exam. 2. Continued interval evolution of previously identified left MCA distribution infarct, overall similar in size and distribution as compared to previous MRI without significant interval expansion. No evidence for hemorrhagic transformation or significant regional mass effect. 3. Additional previously seen small volume ischemic infarcts involving the left parieto-occipital region and contralateral right cerebral hemisphere now no longer visualized. 4. No other new acute intracranial abnormality. MRA HEAD IMPRESSION: 1. Technically limited exam due to motion artifact. 2. Persistent occlusive thrombus  within a left M2 branch as evidenced by susceptibility artifact at the left sylvian fissure on brain MRI. Overall, appearance is similar to prior CTA. 3. Otherwise grossly stable intracranial MRA. No other new or progressive findings. Electronically Signed   By: Jeannine Boga M.D.   On: 06/26/2021 19:11   VAS US DOPPLER PRE CABG  Result Date: 06/26/2021 PREOPERATIVE VASCULAR EVALUATION Patient Name:  DAWNA JAKES  Date of Exam:   06/26/2021 Medical Rec #: [id number]       Accession #:    [id number] Date of Birth: October 05, 1975       Patient Gender: F Patient Age:   9 years Exam Location:  [facility] Procedure:      VAS US DOPPLER PRE CABG Referring Phys: Jean Rosenthal --------------------------------------------------------------------------------  Indications:      Pre-MVR. Risk Factors:     Hypertension, hyperlipidemia, prior CVA. Limitations:      Patient in hand restraints- unable to assess palmar arch. Comparison Study: No prior studies. Performing Technologist: Darlin Coco RDMS, RVT  Examination Guidelines: A complete evaluation includes B-mode imaging, spectral Doppler, color Doppler, and power Doppler as needed of all accessible portions of each vessel. Bilateral testing is considered an integral part of a complete examination. Limited examinations for reoccurring indications may be performed as noted.  Right Carotid Findings: +----------+--------+--------+--------+--------+------------------+           PSV cm/sEDV cm/sStenosisDescribeComments           +----------+--------+--------+--------+--------+------------------+ CCA Prox  132     18                                         +----------+--------+--------+--------+--------+------------------+ CCA Distal61      15                      intimal thickening +----------+--------+--------+--------+--------+------------------+ ICA Prox  49      10                                          +----------+--------+--------+--------+--------+------------------+ ICA Distal81      25                      tortuous           +----------+--------+--------+--------+--------+------------------+ +----------+--------+-------+----------------+------------+           PSV cm/sEDV cmsDescribe  Arm Pressure +----------+--------+-------+----------------+------------+ Subclavian149            Multiphasic, WNL             +----------+--------+-------+----------------+------------+ +---------+--------+--+--------+--+---------+ VertebralPSV cm/s52EDV cm/s14Antegrade +---------+--------+--+--------+--+---------+ Left Carotid Findings: +----------+--------+--------+--------+--------+------------------+           PSV cm/sEDV cm/sStenosisDescribeComments           +----------+--------+--------+--------+--------+------------------+ CCA Prox  101     18                                         +----------+--------+--------+--------+--------+------------------+ CCA Distal                                intimal thickening +----------+--------+--------+--------+--------+------------------+ ICA Prox  57      20                                         +----------+--------+--------+--------+--------+------------------+ ICA Distal82      35                                         +----------+--------+--------+--------+--------+------------------+ ECA       68      8                                          +----------+--------+--------+--------+--------+------------------+ +----------+--------+--------+----------------+------------+ SubclavianPSV cm/sEDV cm/sDescribe        Arm Pressure +----------+--------+--------+----------------+------------+           155             Multiphasic, WNL             +----------+--------+--------+----------------+------------+ +---------+--------+--+--------+--+---------+ VertebralPSV cm/s50EDV cm/s18Antegrade  +---------+--------+--+--------+--+---------+  ABI Findings: +--------+------------------+-----+---------+--------+ Right   Rt Pressure (mmHg)IndexWaveform Comment  +--------+------------------+-----+---------+--------+ Brachial                       triphasic         +--------+------------------+-----+---------+--------+  Right Doppler Findings: +--------+--------+-----+---------+--------+ Site    PressureIndexDoppler  Comments +--------+--------+-----+---------+--------+ Brachial             triphasic         +--------+--------+-----+---------+--------+ Radial               triphasic         +--------+--------+-----+---------+--------+ Ulnar                triphasic         +--------+--------+-----+---------+--------+  Left Doppler Findings: +----------+--------+-----+---------+--------+ Site      PressureIndexDoppler  Comments +----------+--------+-----+---------+--------+ Subclavian             triphasic         +----------+--------+-----+---------+--------+ Radial                 triphasic         +----------+--------+-----+---------+--------+ Ulnar                  triphasic         +----------+--------+-----+---------+--------+  Summary: Right Carotid: The extracranial vessels were near-normal with only  minimal wall                thickening or plaque. Left Carotid: The extracranial vessels were near-normal with only minimal wall               thickening or plaque. Vertebrals:  Bilateral vertebral arteries demonstrate antegrade flow. Subclavians: Normal flow hemodynamics were seen in bilateral subclavian              arteries.  Electronically signed by Orlie Pollen on 06/26/2021 at 5:33:57 PM.    Final    DG Abd Portable 1V  Result Date: 06/25/2021 CLINICAL DATA:  Feeding tube placement EXAM: PORTABLE ABDOMEN - 1 VIEW COMPARISON:  06/19/2021 FINDINGS: Single supine view of the abdomen and pelvis with the far left abdomen excluded. Feeding tube  terminates at the distal stomach. Nasogastric tube is presumably been removed. Non-obstructive bowel gas pattern. No gross free intraperitoneal air. IMPRESSION: Feeding tube terminating at the distal stomach. Electronically Signed   By: Abigail Miyamoto M.D.   On: 06/25/2021 10:17   ECHO TEE  Result Date: 06/24/2021    TRANSESOPHOGEAL ECHO REPORT   Patient Name:   BRESHAE BELCHER Date of Exam: 06/24/2021 Medical Rec #:  [id number]      Height:       66.0 in Accession #:    [id number]     Weight:       193.6 lb Date of Birth:  1975/09/17      BSA:          1.972 m Patient Age:    48 years       BP:           115/68 mmHg Patient Gender: F              HR:           102 bpm. Exam Location:  Inpatient Procedure: Transesophageal Echo, 3D Echo, Cardiac Doppler and Color Doppler Indications:     Mitral Valve Vegetation  History:         Patient has prior history of Echocardiogram examinations, most                  recent 06/20/2021. Risk Factors:Dyslipidemia and Hypertension.  Sonographer:     Bernadene Person RDCS Referring Phys:  [id number] Greer Ee [place] Diagnosing Phys: Gwyndolyn Kaufman MD PROCEDURE: After discussion of the risks and benefits of a TEE, an informed consent was obtained. The transesophogeal probe was passed without difficulty through the esophogus of the patient. Sedation performed by performing physician. The patient's vital signs; including heart rate, blood pressure, and oxygen saturation; remained stable throughout the procedure. The patient developed no complications during the procedure. IMPRESSIONS  1. There is a large 1.4x1.3cm highly mobile mass visualized on the posterior mitral valve leaflet consistent with a mitral valve vegetation. There is an anechoic region surrounding the vegetation with color flow visualized into the area concerning for abscess (clip 35, 36). There is moderate, highly eccentric mitral regurgitation with no flow reversal visualized in the pulmonary veins. 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 . The mitral valve is abnormal. Moderate mitral valve regurgitation.  2. Left ventricular ejection fraction, by estimation, is 60 to 65%. The left ventricle has normal function.  3. Right ventricular systolic function is normal. The right ventricular size is normal.  4. No left atrial/left atrial appendage thrombus was detected.  5. The aortic valve is tricuspid. Aortic valve regurgitation is not visualized.  No aortic stenosis is present. FINDINGS  Left Ventricle: Left ventricular ejection fraction, by estimation, is 60 to 65%. The left ventricle has normal function. The left ventricular internal cavity size was normal in size. Right Ventricle: The right ventricular size is normal. No increase in right ventricular wall thickness. Right ventricular systolic function is normal. Left Atrium: Left atrial size was normal in size. No left atrial/left atrial appendage thrombus was detected. Right Atrium: Right atrial size was normal in size. Pericardium: There is no evidence of pericardial effusion. Mitral Valve: There is a large 1.4x1.3cm highly mobile mass visualized on the posterior mitral valve leaflet consistent with a mitral valve vegetation. There is an anechoic region surrounding the vegetation with color flow visualized into the area concerning for abscess (clip 35, 36). There is moderate, highly eccentric mitral regurgitation with no flow reversal visualized in the pulmonary veins. Findings consistent with mitral valve endocarditis with abscess formation. The mitral valve is abnormal. Moderate mitral valve regurgitation. Tricuspid Valve: The tricuspid valve is normal in structure. Tricuspid valve regurgitation is trivial. Aortic Valve: The aortic valve is tricuspid. Aortic valve regurgitation is not visualized. No aortic stenosis is present. Pulmonic Valve: The pulmonic valve was normal in structure. Pulmonic valve regurgitation is trivial. Aorta: The aortic  root is normal in size and structure. IAS/Shunts: The atrial septum is grossly normal.  MR PISA:        1.57 cm MR PISA Radius: 0.50 cm Gwyndolyn Kaufman MD Electronically signed by Gwyndolyn Kaufman MD Signature Date/Time: 06/24/2021/3:55:22 PM    Final    DG CHEST PORT 1 VIEW  Result Date: 06/24/2021 CLINICAL DATA:  Sepsis. Acute respiratory failure. Endotracheally intubated. EXAM: PORTABLE CHEST 1 VIEW COMPARISON:  06/19/2021 FINDINGS: Endotracheal tube and nasogastric tube remain in appropriate position. Increased subsegmental atelectasis is seen in both mid lung zones. No evidence of pulmonary consolidation or pleural effusion. IMPRESSION: Increased bilateral subsegmental atelectasis. Electronically Signed   By: Marlaine Hind M.D.   On: 06/24/2021 08:17   MR KNEE LEFT W WO CONTRAST  Result Date: 06/21/2021 CLINICAL DATA:  Left knee pain EXAM: MRI OF THE LEFT KNEE WITHOUT AND WITH CONTRAST TECHNIQUE: Multiplanar, multisequence MR imaging of the left knee was performed both before and after administration of intravenous contrast. CONTRAST:  8.57mL GADAVIST GADOBUTROL 1 MMOL/ML IV SOLN COMPARISON:  Left knee MRI 01/01/2021 FINDINGS: MENISCI Medial: There is intrasubstance degenerative signal without definitive medial meniscus tear. Lateral: Complex degenerative tearing of the anterior horn of the lateral meniscus extending to the horn-body junction. There is substance loss of the macerated appearance of the anterior horn. LIGAMENTS Cruciates: ACL and PCL are intact. Collaterals: Intact MCL with mild thickening proximally, likely related to prior trauma. Lateral collateral ligament complex is intact. CARTILAGE Patellofemoral: Progressive patellofemoral chondrosis with areas of intermediate and high-grade cartilage loss worst along the median patellar ridge. Medial: Progressive, full-thickness cartilage loss along the weight-bearing surfaces. Lateral: Progressive, intermediate grade cartilage loss along the  weight-bearing surfaces. JOINT: Small heterogeneous joint effusion with thick synovial enhancement. POPLITEAL FOSSA: Miniscule Baker's cyst. EXTENSOR MECHANISM: Intact quadriceps tendon. Prior patellar tendon repair. There is enlargement of the lobular intraosseous cyst within the proximal tibia, measuring 2.6 x 4.0 x 2.7 cm, previously 3.4 x 2.4 x 1.6 cm (coronal PD image 21, sagittal PD image 24). The inferior margin is also wider measuring up to 1.3 cm in width, previously 0.6 cm (sagittal PD image 22). This intraosseous cystic area demonstrates globular intermediate signal within the superior aspect (series  8, image 20). BONES: New, intense marrow edema within the proximal tibia and to a slightly lesser degree in the fibular head and femoral condyles. Low T1 marrow signal in the proximal tibia. There is also increased marrow edema in the patella. Other: There is extensive soft tissue swelling along the knee. There are lobular rim enhancing fluid collections along the anterolateral knee, measuring 0.6 cm short axis at the level of the distal quadriceps tendon (series 8, image 6), and up to 1.5 cm short axis along the lateral knee superficial to the attachment of the IT band (series 8, image 15). There are small rim enhancing collections within the popliteus muscle, proximal anterior compartment musculature of the lower leg (series 16, image 26), and distal aspect of the vastus medialis posteriorly (series 16, image 2). 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 Moderate-sized joint effusion with thick synovial enhancement. Superficial rim enhancing fluid collections, likely abscesses, along the anterolateral knee, measurements above. Small developing abscesses within the popliteus muscle, proximal anterior compartment musculature of the lower leg, and distal vastus medialis posteriorly. Enlargement of an intraosseous cyst in the proximal tibia  which communicates with the joint via prior patellar tendon repair, which is also likely infected. Progressive tricompartment arthritis, cartilaginous abnormalities as described above. Complex degenerative tear involving the anterior horn of the lateral meniscus extending to the horn-body junction. Electronically Signed   By: Maurine Simmering M.D.   On: 06/21/2021 08:01   CT MAXILLOFACIAL W CONTRAST  Result Date: 06/21/2021 CLINICAL DATA:  MSSA bacteremia EXAM: CT MAXILLOFACIAL WITH CONTRAST TECHNIQUE: Multidetector CT imaging of the maxillofacial structures was performed with intravenous contrast. Multiplanar CT image reconstructions were also generated. RADIATION DOSE REDUCTION: This exam was performed according to the departmental dose-optimization program which includes automated exposure control, adjustment of the mA and/or kV according to patient size and/or use of iterative reconstruction technique. CONTRAST:  40mL OMNIPAQUE IOHEXOL 300 MG/ML  SOLN COMPARISON:  None Available. FINDINGS: Osseous: There is a periapical lucency at the root of the most posterior remaining right mandibular tooth. Orbits: Negative. No traumatic or inflammatory finding. Sinuses: Clear. Soft tissues: Negative. Limited intracranial: Recent left insula infarct. IMPRESSION: 1. Recent left insula infarct. 2. Periapical lucency at the root of the most posterior remaining right mandibular tooth. Electronically Signed   By: Ulyses Jarred M.D.   On: 06/21/2021 00:46   ECHOCARDIOGRAM COMPLETE  Result Date: 06/20/2021    ECHOCARDIOGRAM REPORT   Patient Name:   ALVERTA CACCAMO Date of Exam: 06/20/2021 Medical Rec #:  [id number]      Height:       66.0 in Accession #:    [id number]     Weight:       185.0 lb Date of Birth:  Dec 03, 1975      BSA:          1.934 m Patient Age:    57 years       BP:           112/84 mmHg Patient Gender: F              HR:           84 bpm. Exam Location:  Inpatient Procedure: 2D Echo, 3D Echo, Cardiac Doppler and  Color Doppler Indications:     Stroke I63.9 Bacteremia.  History:         Patient has no prior history of Echocardiogram examinations.  Signs/Symptoms:Bacteremia; Risk Factors:Hypertension and                  Dyslipidemia.  Sonographer:     Roseanna Rainbow RDCS Referring Phys:  [id number] Lorenza Chick Diagnosing Phys: [name]  Sonographer Comments: Technically difficult study due to poor echo windows and echo performed with patient supine and on artificial respirator. Patient supine. IMPRESSIONS  1. Left ventricular ejection fraction, by estimation, is 50%. The left ventricle has mildly decreased function. The left ventricle demonstrates global hypokinesis. Left ventricular diastolic parameters are consistent with Grade I diastolic dysfunction (impaired relaxation).  2. Right ventricular systolic function is normal. The right ventricular size is normal. There is mildly elevated pulmonary artery systolic pressure. The estimated right ventricular systolic pressure is 67.6 mmHg.  3. Small 10 x 6 mm vegetation noted on the mitral valve. The mitral valve is abnormal. Moderate to severe mitral valve regurgitation not fully visualized due to eccentricity, splay artifact present suggests significant MR. No evidence of mitral stenosis.  4. Tricuspid valve regurgitation is moderate.  5. The aortic valve is tricuspid. Aortic valve regurgitation is not visualized. No aortic stenosis is present.  6. The inferior vena cava is dilated in size with <50% respiratory variability, suggesting right atrial pressure of 15 mmHg.  7. Suggest TEE to assess vegetation and mitral regurgitation. FINDINGS  Left Ventricle: Left ventricular ejection fraction, by estimation, is 50%. The left ventricle has mildly decreased function. The left ventricle demonstrates global hypokinesis. The left ventricular internal cavity size was normal in size. There is no left ventricular hypertrophy. Left ventricular diastolic parameters are  consistent with Grade I diastolic dysfunction (impaired relaxation). Right Ventricle: The right ventricular size is normal. No increase in right ventricular wall thickness. Right ventricular systolic function is normal. There is mildly elevated pulmonary artery systolic pressure. The tricuspid regurgitant velocity is 2.39  m/s, and with an assumed right atrial pressure of 15 mmHg, the estimated right ventricular systolic pressure is 72.0 mmHg. Left Atrium: Left atrial size was normal in size. Right Atrium: Right atrial size was normal in size. Pericardium: There is no evidence of pericardial effusion. Mitral Valve: Small 10 x 6 mm vegetation noted on the mitral valve. The mitral valve is abnormal. Moderate to severe mitral valve regurgitation. No evidence of mitral valve stenosis. Tricuspid Valve: The tricuspid valve is normal in structure. Tricuspid valve regurgitation is moderate. Aortic Valve: The aortic valve is tricuspid. Aortic valve regurgitation is not visualized. No aortic stenosis is present. Pulmonic Valve: The pulmonic valve was normal in structure. Pulmonic valve regurgitation is trivial. Aorta: The aortic root is normal in size and structure. Venous: The inferior vena cava is dilated in size with less than 50% respiratory variability, suggesting right atrial pressure of 15 mmHg. IAS/Shunts: No atrial level shunt detected by color flow Doppler.  LEFT VENTRICLE PLAX 2D LVIDd:         4.50 cm     Diastology LVIDs:         3.20 cm     LV e' medial:    10.90 cm/s LV PW:         0.80 cm     LV E/e' medial:  8.1 LV IVS:        0.80 cm     LV e' lateral:   6.96 cm/s LVOT diam:     1.80 cm     LV E/e' lateral: 12.7 LV SV:         52 LV  SV Index:   27 LVOT Area:     2.54 cm                             3D Volume EF: LV Volumes (MOD)           3D EF:        52 % LV vol d, MOD A2C: 92.2 ml LV EDV:       135 ml LV vol d, MOD A4C: 82.1 ml LV ESV:       65 ml LV vol s, MOD A2C: 47.9 ml LV SV:        70 ml LV vol s,  MOD A4C: 37.2 ml LV SV MOD A2C:     44.3 ml LV SV MOD A4C:     82.1 ml LV SV MOD BP:      44.6 ml RIGHT VENTRICLE             IVC RV S prime:     10.20 cm/s  IVC diam: 2.40 cm TAPSE (M-mode): 1.3 cm LEFT ATRIUM           Index        RIGHT ATRIUM          Index LA diam:      2.80 cm 1.45 cm/m   RA Area:     8.48 cm LA Vol (A2C): 34.8 ml 17.99 ml/m  RA Volume:   17.50 ml 9.05 ml/m LA Vol (A4C): 20.0 ml 10.34 ml/m  AORTIC VALVE LVOT Vmax:   117.00 cm/s LVOT Vmean:  78.600 cm/s LVOT VTI:    0.204 m  AORTA Ao Root diam: 2.80 cm Ao Asc diam:  2.90 cm MITRAL VALVE                  TRICUSPID VALVE MV Area (PHT): 4.44 cm       TR Peak grad:   22.8 mmHg MV Decel Time: 171 msec       TR Vmax:        239.00 cm/s MR Peak grad:    100.8 mmHg MR Mean grad:    65.0 mmHg    SHUNTS MR Vmax:         502.00 cm/s  Systemic VTI:  0.20 m MR Vmean:        372.0 cm/s   Systemic Diam: 1.80 cm MR PISA:         1.01 cm MR PISA Eff ROA: 8 mm MR PISA Radius:  0.40 cm MV E velocity: 88.05 cm/s MV A velocity: 109.50 cm/s MV E/A ratio:  0.80 Dalton McleanMD Electronically signed by Franki Monte Signature Date/Time: 06/20/2021/3:18:16 PM    Final (Updated)    Overnight EEG with video  Result Date: 06/20/2021 Lora Havens, MD     06/20/2021  6:30 PM Patient Name: John Vasconcelos MRN: [id number] Epilepsy Attending: Lora Havens Referring Physician/Provider: Lorenza Chick, MD Duration: 06/20/2021 [id number]  Patient history:  46 year old woman with past medical history of schizophrenia, hypertension, hyperlipidemia, presenting with fever, altered mental status, and found to have multifocal strokes, with an acute change in mental status.  EEG to evaluate for seizure.  Level of alertness: lethargic  AEDs during EEG study: None  Technical aspects: This EEG study was done with scalp electrodes positioned according to the 10-20 International system of electrode placement. Electrical activity was acquired at a sampling rate of $Remov'500Hz'ehyCWu$  and  reviewed with a high frequency filter of $RemoveB'70Hz'ItvOTKvu$  and a low frequency filter of $RemoveB'1Hz'lOQoCMbC$ . EEG data were recorded continuously and digitally stored.  Description: EEG showed continuous generalized predominantly 5 to 7 Hz theta slowing admixed with intermittent generalized 2 to 3 Hz delta slowing.  Hyperventilation and photic stimulation were not performed.    ABNORMALITY - Continuous slow, generalized  IMPRESSION: This study is suggestive of moderate diffuse encephalopathy, nonspecific etiology. No seizures or epileptiform discharges were seen throughout the recording.  Lora Havens   EEG adult  Result Date: 06/20/2021 Lora Havens, MD     06/20/2021  9:56 AM Patient Name: Cassara Nida MRN: [id number] Epilepsy Attending: Lora Havens Referring Physician/Provider: Julian Hy, DO Date: 06/19/2021 Duration: 22.59 mins Patient history:  46 year old woman with past medical history of schizophrenia, hypertension, hyperlipidemia, presenting with fever, altered mental status, and found to have multifocal strokes, with an acute change in mental status.  EEG to evaluate for seizure. Level of alertness: lethargic AEDs during EEG study: None Technical aspects: This EEG study was done with scalp electrodes positioned according to the 10-20 International system of electrode placement. Electrical activity was acquired at a sampling rate of $Remov'500Hz'JLJSxK$  and reviewed with a high frequency filter of $RemoveB'70Hz'ZkFBuBtm$  and a low frequency filter of $RemoveB'1Hz'cNvSsTHs$ . EEG data were recorded continuously and digitally stored. Description: EEG showed continuous generalized 3 to 6 Hz theta-delta slowing. Hyperventilation and photic stimulation were not performed.   ABNORMALITY - Continuous slow, generalized IMPRESSION: This study is suggestive of moderate to severe diffuse encephalopathy, nonspecific etiology. No seizures or epileptiform discharges were seen throughout the recording. Priyanka [name]   CT ANGIO HEAD NECK W WO CM W PERF (CODE STROKE)  Result  Date: 06/20/2021 CLINICAL DATA:  Altered mental status. EXAM: CT ANGIOGRAPHY HEAD AND NECK CT PERFUSION BRAIN TECHNIQUE: Multidetector CT imaging of the head and neck was performed using the standard protocol during bolus administration of intravenous contrast. Multiplanar CT image reconstructions and MIPs were obtained to evaluate the vascular anatomy. Carotid stenosis measurements (when applicable) are obtained utilizing NASCET criteria, using the distal internal carotid diameter as the denominator. Multiphase CT imaging of the brain was performed following IV bolus contrast injection. Subsequent parametric perfusion maps were calculated using RAPID software. RADIATION DOSE REDUCTION: This exam was performed according to the departmental dose-optimization program which includes automated exposure control, adjustment of the mA and/or kV according to patient size and/or use of iterative reconstruction technique. CONTRAST:  147mL OMNIPAQUE IOHEXOL 350 MG/ML SOLN COMPARISON:  None Available. FINDINGS: CTA NECK FINDINGS SKELETON: There is no bony spinal canal stenosis. No lytic or blastic lesion. OTHER NECK: Normal pharynx, larynx and major salivary glands. No cervical lymphadenopathy. Unremarkable thyroid gland. UPPER CHEST: No pneumothorax or pleural effusion. No nodules or masses. AORTIC ARCH: There is no calcific atherosclerosis of the aortic arch. There is no aneurysm, dissection or hemodynamically significant stenosis of the visualized portion of the aorta. Conventional 3 vessel aortic branching pattern. The visualized proximal subclavian arteries are widely patent. RIGHT CAROTID SYSTEM: No dissection, occlusion or aneurysm. Minimal atherosclerotic calcification at the carotid bifurcation without hemodynamically significant stenosis. LEFT CAROTID SYSTEM: Normal without aneurysm, dissection or stenosis. VERTEBRAL ARTERIES: Left dominant configuration. Both origins are clearly patent. There is no dissection,  occlusion or flow-limiting stenosis to the skull base (V1-V3 segments). CTA HEAD FINDINGS POSTERIOR CIRCULATION: --Vertebral arteries: Normal V4 segments. --Inferior cerebellar arteries: Normal. --Basilar artery: Normal. --Superior cerebellar arteries: Normal. --Posterior cerebral arteries (PCA): Normal. ANTERIOR  CIRCULATION: --Intracranial internal carotid arteries: Normal. --Anterior cerebral arteries (ACA): Normal. Both A1 segments are present. Patent anterior communicating artery (a-comm). --Middle cerebral arteries (MCA): There is occlusion of a left MCA M2 branch in the insula (series 8, image 107). The M1 segment is patent without stenosis. Right MCA is normal. VENOUS SINUSES: As permitted by contrast timing, patent. ANATOMIC VARIANTS: None Review of the MIP images confirms the above findings. CT Brain Perfusion Findings: ASPECTS: 8 at 9:40 p.m. on 06/19/2021 CBF (<30%) Volume: 9mL Perfusion (Tmax>6.0s) volume: 69mL Mismatch Volume: 64mL Infarction Location:No infarction by CT perfusion analysis. However, this is likely artifactual, because there is completed infarction demonstrated on the noncontrast CT and the MRI, which often confounds perfusion data. IMPRESSION: 1. Occlusion of a left MCA M2 branch in the insula. No other intracranial arterial occlusion or high-grade stenosis. 2. CT perfusion does not show a significant region of penumbra. 3. No embolic source identified within the neck. Echocardiogram may be helpful to assess for potential cardiac embolic source. Electronically Signed   By: Ulyses Jarred M.D.   On: 06/20/2021 03:04   MR BRAIN WO CONTRAST  Result Date: 06/20/2021 CLINICAL DATA:  Acute neurologic deficit EXAM: MRI HEAD WITHOUT CONTRAST TECHNIQUE: Multiplanar, multiecho pulse sequences of the brain and surrounding structures were obtained without intravenous contrast. COMPARISON:  None Available. FINDINGS: Brain: There is multifocal acute ischemia, predominantly a left MCA territory  large cortical infarct. There are scattered punctate foci of acute ischemia within the right hemisphere and in the left occipital lobe. No acute or chronic hemorrhage. There is multifocal hyperintense T2-weighted signal within the white matter. Parenchymal volume and CSF spaces are normal. The midline structures are normal. Vascular: Major flow voids are preserved. Skull and upper cervical spine: Normal calvarium and skull base. Visualized upper cervical spine and soft tissues are normal. Sinuses/Orbits:No paranasal sinus fluid levels or advanced mucosal thickening. No mastoid or middle ear effusion. Normal orbits. IMPRESSION: 1. Large left MCA territory cortical infarct. No hemorrhage or mass effect. 2. Scattered punctate foci of acute ischemia within the right hemisphere and left occipital lobe. Electronically Signed   By: Ulyses Jarred M.D.   On: 06/20/2021 00:10   CT Head Wo Contrast  Result Date: 06/19/2021 CLINICAL DATA:  Altered mental status EXAM: CT HEAD WITHOUT CONTRAST TECHNIQUE: Contiguous axial images were obtained from the base of the skull through the vertex without intravenous contrast. RADIATION DOSE REDUCTION: This exam was performed according to the departmental dose-optimization program which includes automated exposure control, adjustment of the mA and/or kV according to patient size and/or use of iterative reconstruction technique. COMPARISON:  Head CT 06/19/2021 FINDINGS: Brain: There is no evidence of acute intracranial hemorrhage. There is loss of gray-white matter differentiation along the left insula (series 2, images 12-15).The basal cisterns are patent.The ventricles are normal in size. Vascular: No hyperdense vessel or unexpected calcification. Skull: Normal. Negative for fracture or focal lesion. Sinuses/Orbits: There is frothy material in the nasal cavities. Mild ethmoid air cell mucosal thickening. Other: None. IMPRESSION: Loss of gray-white matter differentiation along the left  insula, concerning for infarction. Recommend CTA head/neck and/or brain MRI. Critical Value/emergent results were called by telephone at the time of interpretation on 06/19/2021 at 10:07 pm to provider Carmin Muskrat , who verbally acknowledged these results. Electronically Signed   By: Maurine Simmering M.D.   On: 06/19/2021 22:09   DG Chest Portable 1 View  Result Date: 06/19/2021 CLINICAL DATA:  post intubation; OG tube placement EXAM: PORTABLE  CHEST 1 VIEW COMPARISON:  None Available. FINDINGS: Endotracheal tube approximately 3-4 cm above the carina. Enteric tube coursing below the hemidiaphragm with tip and side port overlying the expected region of the gastric lumen. Lines and tubes overlie the abdomen. The heart and mediastinal contours are within normal limits. No focal consolidation. No pulmonary edema. No pleural effusion. No pneumothorax. Nonobstructive bowel gas pattern. Stool noted throughout the visualized colon. No acute osseous abnormality. IMPRESSION: 1. No active cardiopulmonary disease. 2. Nonobstructive bowel gas pattern. 3. Lines and tubes in good position. Electronically Signed   By: Iven Finn M.D.   On: 06/19/2021 21:29   DG Abdomen 1 View  Result Date: 06/19/2021 CLINICAL DATA:  post intubation; OG tube placement EXAM: PORTABLE CHEST 1 VIEW COMPARISON:  None Available. FINDINGS: Endotracheal tube approximately 3-4 cm above the carina. Enteric tube coursing below the hemidiaphragm with tip and side port overlying the expected region of the gastric lumen. Lines and tubes overlie the abdomen. The heart and mediastinal contours are within normal limits. No focal consolidation. No pulmonary edema. No pleural effusion. No pneumothorax. Nonobstructive bowel gas pattern. Stool noted throughout the visualized colon. No acute osseous abnormality. IMPRESSION: 1. No active cardiopulmonary disease. 2. Nonobstructive bowel gas pattern. 3. Lines and tubes in good position. Electronically Signed   By:  Iven Finn M.D.   On: 06/19/2021 21:29   US Abdomen Limited RUQ (LIVER/GB)  Result Date: 06/19/2021 CLINICAL DATA:  825053 elevated LFTs EXAM: ULTRASOUND ABDOMEN LIMITED RIGHT UPPER QUADRANT COMPARISON:  None Available. FINDINGS: Gallbladder: No gallstones or wall thickening visualized. Gallbladder wall measures 2.7 mm without pericholecystic fluid. No sonographic Murphy sign noted by sonographer. Common bile duct: Diameter: 2.7 mm Liver: No focal lesion identified. Within normal limits in parenchymal echogenicity. Portal vein is patent on color Doppler imaging with normal direction of blood flow towards the liver. Other: None. IMPRESSION: Unremarkable limited ultrasound imaging of the right upper quadrant of the abdomen. Electronically Signed   By: Frazier Richards M.D.   On: 06/19/2021 16:05   CT Cervical Spine Wo Contrast  Result Date: 06/19/2021 CLINICAL DATA:  Neck trauma, intoxicated or obtunded (Age >= 16y) EXAM: CT CERVICAL SPINE WITHOUT CONTRAST TECHNIQUE: Multidetector CT imaging of the cervical spine was performed without intravenous contrast. Multiplanar CT image reconstructions were also generated. RADIATION DOSE REDUCTION: This exam was performed according to the departmental dose-optimization program which includes automated exposure control, adjustment of the mA and/or kV according to patient size and/or use of iterative reconstruction technique. COMPARISON:  None Available. FINDINGS: Alignment: Preserved. Skull base and vertebrae: Vertebral body heights are maintained. No acute fracture. Soft tissues and spinal canal: No prevertebral fluid or swelling. No visible canal hematoma. Disc levels:  Intervertebral disc heights are maintained. Upper chest: Included lung apices are clear. Other: Trace calcified plaque at the right common carotid bifurcation. IMPRESSION: No acute fracture. Electronically Signed   By: Macy Mis M.D.   On: 06/19/2021 14:59   CT Head Wo Contrast  Result Date:  06/19/2021 CLINICAL DATA:  Mental status change, unknown cause EXAM: CT HEAD WITHOUT CONTRAST TECHNIQUE: Contiguous axial images were obtained from the base of the skull through the vertex without intravenous contrast. RADIATION DOSE REDUCTION: This exam was performed according to the departmental dose-optimization program which includes automated exposure control, adjustment of the mA and/or kV according to patient size and/or use of iterative reconstruction technique. COMPARISON:  None Available. FINDINGS: Brain: There is no acute intracranial hemorrhage, mass effect, or edema. Gray-white  differentiation is preserved. There is no extra-axial fluid collection. Ventricles and sulci are within normal limits in size and configuration. Vascular: No hyperdense vessel or unexpected calcification. Skull: Calvarium is unremarkable. Sinuses/Orbits: No acute finding. Other: None. IMPRESSION: No acute intracranial abnormality. Electronically Signed   By: Macy Mis M.D.   On: 06/19/2021 14:57   DG Chest Port 1 View  Result Date: 06/19/2021 CLINICAL DATA:  Altered mental status.  Schizophrenia. EXAM: PORTABLE CHEST 1 VIEW COMPARISON:  06/25/2007 FINDINGS: The heart size and mediastinal contours are within normal limits. Both lungs are clear. The visualized skeletal structures are unremarkable. IMPRESSION: No active disease. Electronically Signed   By: Nelson Chimes M.D.   On: 06/19/2021 14:19   XR Ankle Complete Left  Result Date: 06/18/2021 Radiographs of her left ankle were reviewed today.  Findings consistent with previous intramedullary rodding and rod removal of a left tibia.  On 1 AP projection cannot completely rule out that she has not refractured the fibula though in other projections this appears healed and may just be callus formation  XR Tibia/Fibula Left  Result Date: 06/18/2021 Views of her tib-fib were taken in multiple projections today.  She does have findings consistent with previous fracture of  the midshaft tibia and fibula.  Has slight irregularity at the proximal fibula unable to determine if this is new would have to coordinate with exam.       Subjective: Patient seen and examined at the bedside this morning.  Hemodynamically stable for discharge to inpatient rehab today.  Discharge Exam: Vitals:   07/17/21 0353 07/17/21 0834  BP: 137/87 (!) 140/92  Pulse: 99 (!) 106  Resp: 18 18  Temp: (!) 97.4 F (36.3 C) 98.5 F (36.9 C)  SpO2: 98% 97%   Vitals:   07/17/21 0001 07/17/21 0353 07/17/21 0715 07/17/21 0834  BP: 134/85 137/87  (!) 140/92  Pulse: 99 99  (!) 106  Resp: $Remo'18 18  18  'JKQTZ$ Temp: 98.7 F (37.1 C) (!) 97.4 F (36.3 C)  98.5 F (36.9 C)  TempSrc: Oral Oral  Oral  SpO2: 97% 98%  97%  Weight:   95.8 kg   Height:        General: Pt is alert, awake, not in acute distress Cardiovascular: RRR, S1/S2 +, no rubs, no gallops Respiratory: CTA bilaterally, no wheezing, no rhonchi Abdominal: Soft, NT, ND, bowel sounds + Extremities: no edema, no cyanosis    The results of significant diagnostics from this hospitalization (including imaging, microbiology, ancillary and laboratory) are listed below for reference.     Microbiology: No results found for this or any previous visit (from the past 240 hour(s)).   Labs: BNP (last 3 results) No results for input(s): "BNP" in the last 8760 hours. Basic Metabolic Panel: Recent Labs  Lab 07/11/21 0400 07/12/21 0500 07/15/21 0400  NA 138 138 137  K 3.2* 3.7 3.9  CL 106 105 106  CO2 $Re'27 25 24  'JNK$ GLUCOSE 102* 100* 93  BUN 5* 5* 6  CREATININE 0.79 0.83 0.76  CALCIUM 8.3* 8.5* 8.4*   Liver Function Tests: No results for input(s): "AST", "ALT", "ALKPHOS", "BILITOT", "PROT", "ALBUMIN" in the last 168 hours. No results for input(s): "LIPASE", "AMYLASE" in the last 168 hours. No results for input(s): "AMMONIA" in the last 168 hours. CBC: Recent Labs  Lab 07/15/21 0400  WBC 9.4  HGB 8.7*  HCT 26.6*  MCV 88.7  PLT  446*   Cardiac Enzymes: No results for input(s): "CKTOTAL", "CKMB", "  CKMBINDEX", "TROPONINI" in the last 168 hours. BNP: Invalid input(s): "POCBNP" CBG: Recent Labs  Lab 07/16/21 0659 07/16/21 1625 07/16/21 2358 07/17/21 0618 07/17/21 1137  GLUCAP 90 99 100* 105* 74   D-Dimer No results for input(s): "DDIMER" in the last 72 hours. Hgb A1c No results for input(s): "HGBA1C" in the last 72 hours. Lipid Profile No results for input(s): "CHOL", "HDL", "LDLCALC", "TRIG", "CHOLHDL", "LDLDIRECT" in the last 72 hours. Thyroid function studies No results for input(s): "TSH", "T4TOTAL", "T3FREE", "THYROIDAB" in the last 72 hours.  Invalid input(s): "FREET3" Anemia work up No results for input(s): "VITAMINB12", "FOLATE", "FERRITIN", "TIBC", "IRON", "RETICCTPCT" in the last 72 hours. Urinalysis    Component Value Date/Time   COLORURINE YELLOW 06/27/2021 0353   APPEARANCEUR CLEAR 06/27/2021 0353   LABSPEC 1.017 06/27/2021 0353   PHURINE 5.0 06/27/2021 0353   GLUCOSEU NEGATIVE 06/27/2021 0353   HGBUR NEGATIVE 06/27/2021 0353   BILIRUBINUR NEGATIVE 06/27/2021 0353   KETONESUR NEGATIVE 06/27/2021 0353   PROTEINUR NEGATIVE 06/27/2021 0353   UROBILINOGEN 0.2 06/25/2007 2223   NITRITE NEGATIVE 06/27/2021 0353   LEUKOCYTESUR TRACE (A) 06/27/2021 0353   Sepsis Labs Recent Labs  Lab 07/15/21 0400  WBC 9.4   Microbiology No results found for this or any previous visit (from the past 240 hour(s)).  Please note: You were cared for by a hospitalist during your hospital stay. Once you are discharged, your primary care physician will handle any further medical issues. Please note that NO REFILLS for any discharge medications will be authorized once you are discharged, as it is imperative that you return to your primary care physician (or establish a relationship with a primary care physician if you do not have one) for your post hospital discharge needs so that they can reassess your need  for medications and monitor your lab values.    Time coordinating discharge: 40 minutes  SIGNED:   Shelly Coss, MD  [facility] 07/17/2021, 3:25 PM Pager [phone]  If 7PM-7AM, please contact night-coverage [email or web address] Password [email or web address]

## 2021-07-17 NOTE — TOC Transition Note (Addendum)
Transition of Care [facility]) - CM/SW Discharge Note   Patient Details  Name: Debra Klein MRN: [id number] Date of Birth: 1975/04/04  [facility] CM/SW Contact:  Verdell Carmine, RN Phone Number: 07/17/2021, 3:57 PM   Clinical Narrative:    1500 Appeal approved for [facility] IP rehabilitation. Spoke to Mother of patient nad patient, who wants to be involved in discussion. Messaged MD , RN about feasability of DC today. Spoke with mother. She states she needs to know  asap so she can go get clothes for patient etc. She states she cannot transport her over.  55 MD  Messaged back and stated that patient can DC. Anderson Malta at [facility] updated. 1600 faxed over DC summary . Call report to [phone] [facility] called      Barriers to Discharge: SNF Pending bed offer   Patient Goals and CMS Choice Patient states their goals for this hospitalization and ongoing recovery are:: rehab and hopeful for return home CMS Medicare.gov Compare Post Acute Care list provided to:: Patient Represenative (must comment) (mom) Choice offered to / list presented to : Parent  Discharge Placement                       Discharge Plan and Services In-house Referral: Clinical Social Work Discharge Planning Services: CM Consult Post Acute Care Choice: Skilled Nursing Facility                               Social Determinants of Health (SDOH) Interventions     Readmission Risk Interventions     No data to display

## 2021-07-17 NOTE — Progress Notes (Signed)
PROGRESS NOTE  Jacobi Ryant  OEV:[id number] DOB: Jan 12, 1976 DOA: 06/19/2021 PCP: Gaynelle Arabian, MD   Brief Narrative: Patient is a 46 year old female with history of schizophrenia , hypertension, hyperlipidemia who initially presented with acute encephalopathy.  No report of eating or drinking for 2 weeks, not taking medications, having multiple falls and recently sustained fracture on the left ankle, placed on cam boot.  She was found on the floor.  On presentation she was found to have fever, leukocytosis, AKI, elevated LFT, hyperkalemia, lactic acidosis.  Patient was started on IV fluids, empiric antibiotics and was admitted.  Her encephalopathy continued to deteriorate and she was finally intubated and was transferred to [facility] service.  CT head showed insular hypodensity concerning for MCA infarct.  Hospital course also remarkable for finding of bacteremia,mitral valve endocarditis, status post mitral valve repair.  ID was following.  Prolonged hospitalization.  Patient transferred to [facility] service on 6/22.  Currently hemodynamically stable.  PT/OT recommending acute inpatient rehab on discharge ,TOC following.  Medically stable for dc when possible.  Prolonged hospital course due to placement issue.  Important events  6/7: Fall --> fibula fracture, Minimally verbal at an outpatient visit with [facility]  6/8: Presented to the ED nonverbal with altered mentation. Progressively worsening mentation with requirement of intubation for airway protection.  Empirically treated for sepsis, presumed meningitis. MRI with large MCA infarct and punctate right hemisphere infarcts.  6/9: Blood cultures obtained on arrival positive for MSSA  6/10 MRI knee demonstrates septic arthritis.  6/13 L knee arthroscopy with I&D with snyovectomy for septic arthritis, TEE 6/14 steroids for no cuff leak 6/15 extubated, much more alert 6/16 mitral valve repair. Left chest tube placed for pleural effusion  6/17 too weak to  come off vent. Not following commands.  6/18 extubated 6/19 required BiPAP due to decreased responsiveness overnight after getting antipsychotic meds    Assessment & Plan:  Principal Problem:   Sepsis ([place]) Active Problems:   Schizophrenia ([place])   Transaminitis   AKI (acute kidney injury) ([place])   Bacteremia due to methicillin susceptible Staphylococcus aureus (MSSA)   Toxic encephalopathy   Cerebrovascular accident (CVA) due to embolic occlusion of left middle cerebral artery (HCC)   Acute respiratory failure with hypoxia (HCC)   Septic arthritis of knee, left (HCC)   S/P mitral valve repair   Pressure injury of skin   Acute bacterial endocarditis  Sepsis/MSSA bacteremia/Staph epidermidis mitral valve endocarditis: ID was following.  Continue current antibiotics.  Afebrile.   Leukocytosis resolved.  Currently on nafcillin, plan to continue through 7/28. Has a picc line on right upper extremity  Staph epidermidis mitral valve endocarditis: Cardiothoracic surgery following, status post mitral valve repair.  Acute metabolic encephalopathy: Secondary to sepsis, stroke.  Mental status has significantly improved and is currently alert and oriented.  Still has some degree of dysarthria but significantly better.  left MCA stroke due to septic emboli: Neurology was following.  Continue to monitor mental status.  Has paraplegia.  Continue aspirin  Brief episode of A-fib: Cardiothoracic surgery started on amiodarone,being tapered.  Also on metoprolol.  Currently in normal sinus rhythm.  Acute respiratory failure: Secondary to left-sided atelectasis, left-sided pleural effusion.  Currently on room air.  Encourage incentive spirometry ,flutter valve  Dysphagia: Secondary to stroke.  She was on core track.  Speech therapy following, recommending dysphagia 1 diet, core track taken out.  Cardiogenic shock: Suspected to be from mitral valve regurgitation.  She was on IV diuresis.  She was  on  inotropes, vasopressors, now discontinued.  Currently blood pressure stable.  Severe hypokalemia: Continue to monitor and supplement  Left knee septic arthritis: MRI of the left knee on 6/10 showed septic arthritis, peritubular osteomyelitis with subcu abscess.  Status post diagnostic arthrocentesis on 6/12.  Cultures showed MSSA.  Status post left knee arthroscopy, I&D, synovectomy.  Continue current antibiotics.  She needs outpatient follow-up with orthopedics  Normocytic anemia: Currently hemoglobin stable.  Prediabetes: Hemoglobin A1c of 6.1.  Currently on sliding scale insulin  History of schizophrenia: Continue fluphenazine, clozapine  Disposition: PT/OT recommended acute inpatient rehab.  TOC following  Pressure Injury 06/27/21 Buttocks Posterior;Medial Stage 2 -  Partial thickness loss of dermis presenting as a shallow open injury with a red, pink wound bed without slough. (Active)  06/27/21 1900  Location: Buttocks  Location Orientation: Posterior;Medial  Staging: Stage 2 -  Partial thickness loss of dermis presenting as a shallow open injury with a red, pink wound bed without slough.  Wound Description (Comments):   Present on Admission: -- (Admit to [facility])       Nutrition Problem: Increased nutrient needs Etiology:  (endocarditis) Pressure Injury 06/27/21 Buttocks Posterior;Medial Stage 2 -  Partial thickness loss of dermis presenting as a shallow open injury with a red, pink wound bed without slough. (Active)  06/27/21 1900  Location: Buttocks  Location Orientation: Posterior;Medial  Staging: Stage 2 -  Partial thickness loss of dermis presenting as a shallow open injury with a red, pink wound bed without slough.  Wound Description (Comments):   Present on Admission:   Dressing Type Foam - Lift dressing to assess site every shift 07/16/21 2000    DVT prophylaxis:enoxaparin (LOVENOX) injection 40 mg Start: 07/02/21 2000 SCDs Start: 06/27/21 1518     Code Status: Full  Code  Family Communication:: Discussed with mother on phone on 6/23  Patient status: Inpatient  Patient is from : Home  Anticipated discharge to: Inpatient rehab   Estimated DC date: Not sure,waiting for bed   Consultants: PCCM, cardiology, cardiothoracic surgery, neurology, ID  Procedures: As above  Antimicrobials:  Anti-infectives (From admission, onward)    Start     Dose/Rate Route Frequency Ordered Stop   06/30/21 1600  vancomycin (VANCOREADY) IVPB 1750 mg/350 mL  Status:  Discontinued        1,750 mg 175 mL/hr over 120 Minutes Intravenous Every 24 hours 06/29/21 1429 06/30/21 0746   06/30/21 1600  vancomycin (VANCOREADY) IVPB 1250 mg/250 mL  Status:  Discontinued        1,250 mg 166.7 mL/hr over 90 Minutes Intravenous Every 24 hours 06/30/21 0746 06/30/21 1351   06/30/21 1500  nafcillin 12 g in sodium chloride 0.9 % 500 mL continuous infusion        12 g 20.8 mL/hr over 24 Hours Intravenous Every 24 hours 06/30/21 1351     06/29/21 1515  vancomycin (VANCOREADY) IVPB 2000 mg/400 mL        2,000 mg 200 mL/hr over 120 Minutes Intravenous  Once 06/29/21 1416 06/29/21 1700   06/27/21 2215  vancomycin (VANCOCIN) IVPB 1000 mg/200 mL premix        1,000 mg 200 mL/hr over 60 Minutes Intravenous  Once 06/27/21 1517 06/27/21 2359   06/27/21 1615  ceFAZolin (ANCEF) IVPB 2g/100 mL premix        2 g 200 mL/hr over 30 Minutes Intravenous Every 8 hours 06/27/21 1517 06/29/21 0538   06/27/21 0400  vancomycin (VANCOCIN) 1,500 mg in sodium chloride  0.9 % 250 mL IVPB        1,500 mg 132.5 mL/hr over 120 Minutes Intravenous To Surgery 06/26/21 1345 06/27/21 1135   06/27/21 0400  ceFAZolin (ANCEF) IVPB 2g/100 mL premix        2 g 200 mL/hr over 30 Minutes Intravenous To Surgery 06/26/21 1345 06/27/21 0945   06/27/21 0400  ceFAZolin (ANCEF) IVPB 2g/100 mL premix        2 g 200 mL/hr over 30 Minutes Intravenous To Surgery 06/26/21 1345 06/27/21 1419   06/20/21 2200  vancomycin  (VANCOREADY) IVPB 1250 mg/250 mL  Status:  Discontinued        1,250 mg 166.7 mL/hr over 90 Minutes Intravenous Every 24 hours 06/19/21 2203 06/19/21 2210   06/20/21 1800  nafcillin 12 g in sodium chloride 0.9 % 500 mL continuous infusion  Status:  Discontinued        12 g 20.8 mL/hr over 24 Hours Intravenous Every 24 hours 06/20/21 1005 06/29/21 1411   06/20/21 1000  vancomycin (VANCOREADY) IVPB 750 mg/150 mL  Status:  Discontinued        750 mg 150 mL/hr over 60 Minutes Intravenous Every 12 hours 06/19/21 2210 06/20/21 1005   06/20/21 0800  acyclovir (ZOVIRAX) 820 mg in dextrose 5 % 250 mL IVPB  Status:  Discontinued        10 mg/kg  81.9 kg 266.4 mL/hr over 60 Minutes Intravenous Every 12 hours 06/19/21 2207 06/20/21 1005   06/20/21 0600  cefTRIAXone (ROCEPHIN) 2 g in sodium chloride 0.9 % 100 mL IVPB  Status:  Discontinued        2 g 200 mL/hr over 30 Minutes Intravenous Every 12 hours 06/19/21 1825 06/20/21 1005   06/19/21 1830  acyclovir (ZOVIRAX) 820 mg in dextrose 5 % 250 mL IVPB        10 mg/kg  81.8 kg (Order-Specific) 266.4 mL/hr over 60 Minutes Intravenous STAT 06/19/21 1817 06/19/21 1956   06/19/21 1830  vancomycin (VANCOCIN) 1,750 mg in sodium chloride 0.9 % 500 mL IVPB        1,750 mg 258.8 mL/hr over 120 Minutes Intravenous STAT 06/19/21 1817 06/20/21 0000   06/19/21 1730  vancomycin (VANCOCIN) IVPB 1000 mg/200 mL premix  Status:  Discontinued        1,000 mg 200 mL/hr over 60 Minutes Intravenous STAT 06/19/21 1725 06/19/21 1817   06/19/21 1700  cefTRIAXone (ROCEPHIN) 2 g in sodium chloride 0.9 % 100 mL IVPB        2 g 200 mL/hr over 30 Minutes Intravenous  Once 06/19/21 1655 06/19/21 1826       Subjective:  Patient seen and examined the bedside this morning.  Hemodynamically started without any complaints today.  No new issues.  Objective: Vitals:   07/17/21 0001 07/17/21 0353 07/17/21 0715 07/17/21 0834  BP: 134/85 137/87  (!) 140/92  Pulse: 99 99  (!) 106   Resp: 18 18  18   Temp: 98.7 F (37.1 C) (!) 97.4 F (36.3 C)  98.5 F (36.9 C)  TempSrc: Oral Oral  Oral  SpO2: 97% 98%  97%  Weight:   95.8 kg   Height:        Intake/Output Summary (Last 24 hours) at 07/17/2021 1235 Last data filed at 07/17/2021 1046 Gross per 24 hour  Intake --  Output 605 ml  Net -605 ml   Filed Weights   07/12/21 0624 07/14/21 0655 07/17/21 0715  Weight: 95 kg 95.4 kg 95.8  kg    Examination:  General exam: Overall comfortable, not in distress, weak, lying in bed,obese HEENT: PERRL Respiratory system:  no wheezes or crackles  Cardiovascular system: S1 & S2 heard, RRR.  Gastrointestinal system: Abdomen is nondistended, soft and nontender. Central nervous system: Alert and oriented.paraplegia Extremities: trace edema, no clubbing ,no cyanosis Skin: No rashes, no ulcers,no icterus       Data Reviewed: I have personally reviewed following labs and imaging studies  CBC: Recent Labs  Lab 07/15/21 0400  WBC 9.4  HGB 8.7*  HCT 26.6*  MCV 88.7  PLT 268*   Basic Metabolic Panel: Recent Labs  Lab 07/11/21 0400 07/12/21 0500 07/15/21 0400  NA 138 138 137  K 3.2* 3.7 3.9  CL 106 105 106  CO2 27 25 24   GLUCOSE 102* 100* 93  BUN 5* 5* 6  CREATININE 0.79 0.83 0.76  CALCIUM 8.3* 8.5* 8.4*     No results found for this or any previous visit (from the past 240 hour(s)).    Radiology Studies: DG Swallowing Func-Speech Pathology  Result Date: 07/16/2021 Table formatting from the original result was not included. Objective Swallowing Evaluation: Type of Study: MBS-Modified Barium Swallow Study  Patient Details Name: Tinna Kolker MRN: [id number] Date of Birth: 1975/02/05 Today's Date: 07/16/2021 Time: SLP Start Time (ACUTE ONLY): 1055 -SLP Stop Time (ACUTE ONLY): 1115 SLP Time Calculation (min) (ACUTE ONLY): 20 min Past Medical History: Past Medical History: Diagnosis Date  Hyperlipemia   Hypertension   IDA (iron deficiency anemia)   Schizophrenia ([place])   Past Surgical History: Past Surgical History: Procedure Laterality Date  KNEE ARTHROSCOPY Left 06/24/2021  Procedure: ARTHROSCOPY LEFT KNEE IRRIGRATION AND [place] AND SYNOVECTOMY;  Surgeon: Mcarthur Rossetti, MD;  Location: [place];  Service: Orthopedics;  Laterality: Left;  MITRAL VALVE REPAIR N/A 06/27/2021  Procedure: MITRAL VALVE REPAIR USING 28MM SIMUFORM SEMI-RIGID ANNULOPLASTY RING;  Surgeon: Melrose Nakayama, MD;  Location: [place];  Service: Open Heart Surgery;  Laterality: N/A;  No previous surgery    TEE WITHOUT CARDIOVERSION N/A 06/27/2021  Procedure: TRANSESOPHAGEAL ECHOCARDIOGRAM (TEE);  Surgeon: Melrose Nakayama, MD;  Location: [place];  Service: Open Heart Surgery;  Laterality: N/A; HPI: Patient is a 46 y.o. female with PMH: HTN, HLD, iron deficiency, anemia, schizophrenia who presented to the hospital on 06/17/21 to ED for evaluation of AMS. Patient initially non-verbal but awake and alert in ED. Mother reported patient as ambulatory and verbal at baseline but had not been eating or drinking for past two weeks (also not taking medications). She has had multiple falls, seen by orthopedic day before admission and questioned possible fracture left ankle and placed in Cam boot. In ED, patient admitted with severe sepsis of unknown source; MRI showed large Left MCA infarct. Patient required intubation on 6/8-6/15/23 and was intubated for surgical procedure (mitral valve repair) on 6/16, extubated 6/18. [place] 6/19.  Subjective: cooperative  Recommendations for follow up therapy are one component of a multi-disciplinary discharge planning process, led by the attending physician.  Recommendations may be updated based on patient status, additional functional criteria and insurance authorization. Assessment / Plan / Recommendation   07/16/2021  12:11 PM Clinical Impressions Clinical Impression Patient exhibits improved swallow function as compared to MBS on 07/03/21. During today's test, oral phase  was [facility] with nectar thick and thin liquids and mildly delayed with puree solids with barium tablet. She refused all other solids offered. Swallow initiation delay at level of vallecular sinus was observed  with nectar thick liquids and with thin liquids, swallow was initiated at level of pyriform sinus. No penetration or aspiration observed with nectar thick liquids. Patient did exhibit flash penetration (PAS 2) with thin liquids but penetrate was well above vocal cords and fully exited laryngeal vestibule without difficulty. No aspiration observed even when patient taking large successive straw sips with thin liquids. SLP recommending upgrade liquids to thin and SLP will follow for toleration and ability to upgrade solids at bedside. SLP Visit Diagnosis Dysphagia, unspecified (R13.10) Impact on safety and function Mild aspiration risk     07/16/2021  12:11 PM Treatment Recommendations Treatment Recommendations Therapy as outlined in treatment plan below     07/16/2021  12:21 PM Prognosis Prognosis for Safe Diet Advancement Good Barriers to Reach Goals Severity of deficits;Cognitive deficits   07/16/2021  12:11 PM Diet Recommendations SLP Diet Recommendations Dysphagia 1 (Puree) solids;Thin liquid Liquid Administration via Cup;Straw Medication Administration Crushed with puree Compensations Slow rate;Small sips/bites;Lingual sweep for clearance of pocketing;Minimize environmental distractions Postural Changes Seated upright at 90 degrees     07/16/2021  12:11 PM Other Recommendations Oral Care Recommendations Oral care BID Follow Up Recommendations Acute inpatient rehab (3hours/day) Assistance recommended at discharge Frequent or constant Supervision/Assistance Functional Status Assessment Patient has had a recent decline in their functional status and demonstrates the ability to make significant improvements in function in a reasonable and predictable amount of time.   07/16/2021  12:11 PM Frequency and Duration  Speech  Therapy Frequency (ACUTE ONLY) min 2x/week Treatment Duration 2 weeks     07/16/2021  12:04 PM Oral Phase Oral Phase Impaired Oral - Nectar Teaspoon NT Oral - Nectar Cup WFL Oral - Nectar Straw NT Oral - Thin Cup WFL Oral - Thin Straw WFL Oral - Puree WFL Oral - Regular NT Oral - Pill Decreased bolus cohesion;Reduced posterior propulsion    07/16/2021  12:06 PM Pharyngeal Phase Pharyngeal Phase Impaired Pharyngeal- Nectar Teaspoon NT Pharyngeal- Nectar Cup Delayed swallow initiation-vallecula Pharyngeal- Nectar Straw NT Pharyngeal- Thin Cup Delayed swallow initiation-pyriform sinuses;Penetration/Aspiration during swallow Pharyngeal Material enters airway, remains ABOVE vocal cords then ejected out Pharyngeal- Thin Straw Delayed swallow initiation-pyriform sinuses;Penetration/Aspiration during swallow Pharyngeal Material enters airway, remains ABOVE vocal cords then ejected out Pharyngeal- Puree Delayed swallow initiation-vallecula Pharyngeal- Mechanical Soft NT Pharyngeal- Pill [facility]    07/16/2021  12:10 PM Cervical Esophageal Phase  Cervical Esophageal Phase Impaired Nectar Cup Esophageal backflow into cervical esophagus Sonia Baller, MA, CCC-SLP Speech Therapy                      Scheduled Meds:  amiodarone  200 mg Oral Daily   aspirin  81 mg Oral Daily   benztropine  1 mg Oral QHS   Chlorhexidine Gluconate Cloth  6 each Topical Daily   cloZAPine  50 mg Oral QHS   enoxaparin (LOVENOX) injection  40 mg Subcutaneous Q24H   fluPHENAZine  1 mg Oral QHS   insulin aspart  0-24 Units Subcutaneous Q6H   LORazepam  0.5 mg Oral Once   metoprolol tartrate  12.5 mg Oral BID   Or   metoprolol tartrate  12.5 mg Per Tube BID   multivitamin with minerals  1 tablet Oral Daily   mouth rinse  15 mL Mouth Rinse 4 times per day   pantoprazole  40 mg Oral Daily   potassium chloride  40 mEq Oral Daily   vitamin B-12  1,000 mcg Oral Daily   Continuous Infusions:  sodium chloride     nafcillin 12 g in sodium chloride  0.9 % 500 mL continuous infusion 12 g (07/16/21 1751)     LOS: 28 days   Shelly Coss, MD [facility] P7/06/2021, 12:35 PM

## 2021-07-17 NOTE — Progress Notes (Signed)
07/17/2021 6:35 PM Report called to [facility].  [place]

## 2021-07-18 DIAGNOSIS — R7881 Bacteremia: Secondary | ICD-10-CM | POA: Diagnosis not present

## 2021-07-18 DIAGNOSIS — I059 Rheumatic mitral valve disease, unspecified: Secondary | ICD-10-CM | POA: Diagnosis not present

## 2021-07-18 DIAGNOSIS — I480 Paroxysmal atrial fibrillation: Secondary | ICD-10-CM | POA: Diagnosis not present

## 2021-07-18 DIAGNOSIS — Z7409 Other reduced mobility: Secondary | ICD-10-CM | POA: Diagnosis not present

## 2021-07-18 DIAGNOSIS — I6310 Cerebral infarction due to embolism of unspecified precerebral artery: Secondary | ICD-10-CM | POA: Diagnosis not present

## 2021-07-18 DIAGNOSIS — I330 Acute and subacute infective endocarditis: Secondary | ICD-10-CM | POA: Diagnosis not present

## 2021-07-18 DIAGNOSIS — S82402A Unspecified fracture of shaft of left fibula, initial encounter for closed fracture: Secondary | ICD-10-CM | POA: Diagnosis not present

## 2021-07-18 DIAGNOSIS — I10 Essential (primary) hypertension: Secondary | ICD-10-CM | POA: Diagnosis not present

## 2021-07-18 DIAGNOSIS — M009 Pyogenic arthritis, unspecified: Secondary | ICD-10-CM | POA: Diagnosis not present

## 2021-07-18 DIAGNOSIS — I63512 Cerebral infarction due to unspecified occlusion or stenosis of left middle cerebral artery: Secondary | ICD-10-CM | POA: Diagnosis not present

## 2021-07-20 DIAGNOSIS — I63512 Cerebral infarction due to unspecified occlusion or stenosis of left middle cerebral artery: Secondary | ICD-10-CM | POA: Diagnosis not present

## 2021-07-20 DIAGNOSIS — S82402D Unspecified fracture of shaft of left fibula, subsequent encounter for closed fracture with routine healing: Secondary | ICD-10-CM | POA: Diagnosis not present

## 2021-07-20 DIAGNOSIS — I330 Acute and subacute infective endocarditis: Secondary | ICD-10-CM | POA: Diagnosis not present

## 2021-07-20 DIAGNOSIS — Z7409 Other reduced mobility: Secondary | ICD-10-CM | POA: Diagnosis not present

## 2021-07-20 DIAGNOSIS — I480 Paroxysmal atrial fibrillation: Secondary | ICD-10-CM | POA: Diagnosis not present

## 2021-07-21 DIAGNOSIS — M009 Pyogenic arthritis, unspecified: Secondary | ICD-10-CM | POA: Diagnosis not present

## 2021-07-21 DIAGNOSIS — I6310 Cerebral infarction due to embolism of unspecified precerebral artery: Secondary | ICD-10-CM | POA: Diagnosis not present

## 2021-07-21 DIAGNOSIS — I63512 Cerebral infarction due to unspecified occlusion or stenosis of left middle cerebral artery: Secondary | ICD-10-CM | POA: Diagnosis not present

## 2021-07-21 DIAGNOSIS — Z7409 Other reduced mobility: Secondary | ICD-10-CM | POA: Diagnosis not present

## 2021-07-21 DIAGNOSIS — I10 Essential (primary) hypertension: Secondary | ICD-10-CM | POA: Diagnosis not present

## 2021-07-21 DIAGNOSIS — R7881 Bacteremia: Secondary | ICD-10-CM | POA: Diagnosis not present

## 2021-07-21 DIAGNOSIS — I330 Acute and subacute infective endocarditis: Secondary | ICD-10-CM | POA: Diagnosis not present

## 2021-07-21 DIAGNOSIS — I059 Rheumatic mitral valve disease, unspecified: Secondary | ICD-10-CM | POA: Diagnosis not present

## 2021-07-21 DIAGNOSIS — S82402D Unspecified fracture of shaft of left fibula, subsequent encounter for closed fracture with routine healing: Secondary | ICD-10-CM | POA: Diagnosis not present

## 2021-07-21 DIAGNOSIS — I480 Paroxysmal atrial fibrillation: Secondary | ICD-10-CM | POA: Diagnosis not present

## 2021-07-22 DIAGNOSIS — I6310 Cerebral infarction due to embolism of unspecified precerebral artery: Secondary | ICD-10-CM | POA: Diagnosis not present

## 2021-07-22 DIAGNOSIS — I330 Acute and subacute infective endocarditis: Secondary | ICD-10-CM | POA: Diagnosis not present

## 2021-07-22 DIAGNOSIS — Z7409 Other reduced mobility: Secondary | ICD-10-CM | POA: Diagnosis not present

## 2021-07-22 DIAGNOSIS — I10 Essential (primary) hypertension: Secondary | ICD-10-CM | POA: Diagnosis not present

## 2021-07-22 DIAGNOSIS — I059 Rheumatic mitral valve disease, unspecified: Secondary | ICD-10-CM | POA: Diagnosis not present

## 2021-07-22 DIAGNOSIS — I480 Paroxysmal atrial fibrillation: Secondary | ICD-10-CM | POA: Diagnosis not present

## 2021-07-22 DIAGNOSIS — D649 Anemia, unspecified: Secondary | ICD-10-CM | POA: Diagnosis not present

## 2021-07-22 DIAGNOSIS — R7881 Bacteremia: Secondary | ICD-10-CM | POA: Diagnosis not present

## 2021-07-22 DIAGNOSIS — M009 Pyogenic arthritis, unspecified: Secondary | ICD-10-CM | POA: Diagnosis not present

## 2021-07-22 DIAGNOSIS — S82402A Unspecified fracture of shaft of left fibula, initial encounter for closed fracture: Secondary | ICD-10-CM | POA: Diagnosis not present

## 2021-07-22 DIAGNOSIS — I63512 Cerebral infarction due to unspecified occlusion or stenosis of left middle cerebral artery: Secondary | ICD-10-CM | POA: Diagnosis not present

## 2021-07-23 DIAGNOSIS — I480 Paroxysmal atrial fibrillation: Secondary | ICD-10-CM | POA: Diagnosis not present

## 2021-07-23 DIAGNOSIS — S82402A Unspecified fracture of shaft of left fibula, initial encounter for closed fracture: Secondary | ICD-10-CM | POA: Diagnosis not present

## 2021-07-23 DIAGNOSIS — Z7409 Other reduced mobility: Secondary | ICD-10-CM | POA: Diagnosis not present

## 2021-07-23 DIAGNOSIS — I330 Acute and subacute infective endocarditis: Secondary | ICD-10-CM | POA: Diagnosis not present

## 2021-07-23 DIAGNOSIS — I63512 Cerebral infarction due to unspecified occlusion or stenosis of left middle cerebral artery: Secondary | ICD-10-CM | POA: Diagnosis not present

## 2021-07-24 DIAGNOSIS — I63512 Cerebral infarction due to unspecified occlusion or stenosis of left middle cerebral artery: Secondary | ICD-10-CM | POA: Diagnosis not present

## 2021-07-24 DIAGNOSIS — I480 Paroxysmal atrial fibrillation: Secondary | ICD-10-CM | POA: Diagnosis not present

## 2021-07-24 DIAGNOSIS — S82402D Unspecified fracture of shaft of left fibula, subsequent encounter for closed fracture with routine healing: Secondary | ICD-10-CM | POA: Diagnosis not present

## 2021-07-24 DIAGNOSIS — I330 Acute and subacute infective endocarditis: Secondary | ICD-10-CM | POA: Diagnosis not present

## 2021-07-24 DIAGNOSIS — Z7409 Other reduced mobility: Secondary | ICD-10-CM | POA: Diagnosis not present

## 2021-07-25 DIAGNOSIS — I059 Rheumatic mitral valve disease, unspecified: Secondary | ICD-10-CM | POA: Diagnosis not present

## 2021-07-25 DIAGNOSIS — I10 Essential (primary) hypertension: Secondary | ICD-10-CM | POA: Diagnosis not present

## 2021-07-25 DIAGNOSIS — I480 Paroxysmal atrial fibrillation: Secondary | ICD-10-CM | POA: Diagnosis not present

## 2021-07-25 DIAGNOSIS — I330 Acute and subacute infective endocarditis: Secondary | ICD-10-CM | POA: Diagnosis not present

## 2021-07-25 DIAGNOSIS — R7881 Bacteremia: Secondary | ICD-10-CM | POA: Diagnosis not present

## 2021-07-25 DIAGNOSIS — D649 Anemia, unspecified: Secondary | ICD-10-CM | POA: Diagnosis not present

## 2021-07-25 DIAGNOSIS — Z7409 Other reduced mobility: Secondary | ICD-10-CM | POA: Diagnosis not present

## 2021-07-25 DIAGNOSIS — S82402D Unspecified fracture of shaft of left fibula, subsequent encounter for closed fracture with routine healing: Secondary | ICD-10-CM | POA: Diagnosis not present

## 2021-07-25 DIAGNOSIS — I6310 Cerebral infarction due to embolism of unspecified precerebral artery: Secondary | ICD-10-CM | POA: Diagnosis not present

## 2021-07-25 DIAGNOSIS — I63512 Cerebral infarction due to unspecified occlusion or stenosis of left middle cerebral artery: Secondary | ICD-10-CM | POA: Diagnosis not present

## 2021-07-27 DIAGNOSIS — R1310 Dysphagia, unspecified: Secondary | ICD-10-CM | POA: Diagnosis not present

## 2021-07-27 DIAGNOSIS — I059 Rheumatic mitral valve disease, unspecified: Secondary | ICD-10-CM | POA: Diagnosis not present

## 2021-07-27 DIAGNOSIS — Z7409 Other reduced mobility: Secondary | ICD-10-CM | POA: Diagnosis not present

## 2021-07-27 DIAGNOSIS — I480 Paroxysmal atrial fibrillation: Secondary | ICD-10-CM | POA: Diagnosis not present

## 2021-07-27 DIAGNOSIS — D649 Anemia, unspecified: Secondary | ICD-10-CM | POA: Diagnosis not present

## 2021-07-27 DIAGNOSIS — I10 Essential (primary) hypertension: Secondary | ICD-10-CM | POA: Diagnosis not present

## 2021-07-27 DIAGNOSIS — S82402D Unspecified fracture of shaft of left fibula, subsequent encounter for closed fracture with routine healing: Secondary | ICD-10-CM | POA: Diagnosis not present

## 2021-07-27 DIAGNOSIS — I330 Acute and subacute infective endocarditis: Secondary | ICD-10-CM | POA: Diagnosis not present

## 2021-07-27 DIAGNOSIS — I63512 Cerebral infarction due to unspecified occlusion or stenosis of left middle cerebral artery: Secondary | ICD-10-CM | POA: Diagnosis not present

## 2021-07-27 DIAGNOSIS — I69391 Dysphagia following cerebral infarction: Secondary | ICD-10-CM | POA: Diagnosis not present

## 2021-07-28 ENCOUNTER — Encounter: Payer: Medicare Other | Admitting: Physician Assistant

## 2021-07-28 DIAGNOSIS — I330 Acute and subacute infective endocarditis: Secondary | ICD-10-CM | POA: Diagnosis not present

## 2021-07-28 DIAGNOSIS — Z7409 Other reduced mobility: Secondary | ICD-10-CM | POA: Diagnosis not present

## 2021-07-28 DIAGNOSIS — I63512 Cerebral infarction due to unspecified occlusion or stenosis of left middle cerebral artery: Secondary | ICD-10-CM | POA: Diagnosis not present

## 2021-07-28 DIAGNOSIS — S82402D Unspecified fracture of shaft of left fibula, subsequent encounter for closed fracture with routine healing: Secondary | ICD-10-CM | POA: Diagnosis not present

## 2021-07-28 DIAGNOSIS — I480 Paroxysmal atrial fibrillation: Secondary | ICD-10-CM | POA: Diagnosis not present

## 2021-07-28 LAB — FUNGUS CULTURE RESULT

## 2021-07-28 LAB — FUNGAL ORGANISM REFLEX

## 2021-07-28 LAB — FUNGUS CULTURE WITH STAIN

## 2021-07-29 ENCOUNTER — Inpatient Hospital Stay: Payer: Medicare Other | Admitting: Internal Medicine

## 2021-07-29 DIAGNOSIS — S82402A Unspecified fracture of shaft of left fibula, initial encounter for closed fracture: Secondary | ICD-10-CM | POA: Diagnosis not present

## 2021-07-29 DIAGNOSIS — D649 Anemia, unspecified: Secondary | ICD-10-CM | POA: Diagnosis not present

## 2021-07-29 DIAGNOSIS — I059 Rheumatic mitral valve disease, unspecified: Secondary | ICD-10-CM | POA: Diagnosis not present

## 2021-07-29 DIAGNOSIS — I63512 Cerebral infarction due to unspecified occlusion or stenosis of left middle cerebral artery: Secondary | ICD-10-CM | POA: Diagnosis not present

## 2021-07-29 DIAGNOSIS — I10 Essential (primary) hypertension: Secondary | ICD-10-CM | POA: Diagnosis not present

## 2021-07-29 DIAGNOSIS — I330 Acute and subacute infective endocarditis: Secondary | ICD-10-CM | POA: Diagnosis not present

## 2021-07-29 DIAGNOSIS — Z7409 Other reduced mobility: Secondary | ICD-10-CM | POA: Diagnosis not present

## 2021-07-29 DIAGNOSIS — I6930 Unspecified sequelae of cerebral infarction: Secondary | ICD-10-CM | POA: Diagnosis not present

## 2021-07-29 DIAGNOSIS — I480 Paroxysmal atrial fibrillation: Secondary | ICD-10-CM | POA: Diagnosis not present

## 2021-07-30 ENCOUNTER — Telehealth: Payer: Self-pay

## 2021-07-30 DIAGNOSIS — I63512 Cerebral infarction due to unspecified occlusion or stenosis of left middle cerebral artery: Secondary | ICD-10-CM | POA: Diagnosis not present

## 2021-07-30 DIAGNOSIS — I480 Paroxysmal atrial fibrillation: Secondary | ICD-10-CM | POA: Diagnosis not present

## 2021-07-30 DIAGNOSIS — I330 Acute and subacute infective endocarditis: Secondary | ICD-10-CM | POA: Diagnosis not present

## 2021-07-30 DIAGNOSIS — M79604 Pain in right leg: Secondary | ICD-10-CM | POA: Diagnosis not present

## 2021-07-30 DIAGNOSIS — Z7409 Other reduced mobility: Secondary | ICD-10-CM | POA: Diagnosis not present

## 2021-07-30 DIAGNOSIS — M79605 Pain in left leg: Secondary | ICD-10-CM | POA: Diagnosis not present

## 2021-07-30 DIAGNOSIS — S82402D Unspecified fracture of shaft of left fibula, subsequent encounter for closed fracture with routine healing: Secondary | ICD-10-CM | POA: Diagnosis not present

## 2021-07-30 NOTE — Telephone Encounter (Addendum)
Patient's mother returned call regarding missed appointment yesterday. She states Debra Klein is still in the hospital and unable to reschedule at this time.   Did not discuss any private or health information.   Beryle Flock, RN

## 2021-07-30 NOTE — Progress Notes (Signed)
This encounter was created in error - please disregard.

## 2021-07-31 DIAGNOSIS — I63512 Cerebral infarction due to unspecified occlusion or stenosis of left middle cerebral artery: Secondary | ICD-10-CM | POA: Diagnosis not present

## 2021-07-31 DIAGNOSIS — I480 Paroxysmal atrial fibrillation: Secondary | ICD-10-CM | POA: Diagnosis not present

## 2021-07-31 DIAGNOSIS — D649 Anemia, unspecified: Secondary | ICD-10-CM | POA: Diagnosis not present

## 2021-07-31 DIAGNOSIS — I059 Rheumatic mitral valve disease, unspecified: Secondary | ICD-10-CM | POA: Diagnosis not present

## 2021-07-31 DIAGNOSIS — Z7409 Other reduced mobility: Secondary | ICD-10-CM | POA: Diagnosis not present

## 2021-07-31 DIAGNOSIS — S82402D Unspecified fracture of shaft of left fibula, subsequent encounter for closed fracture with routine healing: Secondary | ICD-10-CM | POA: Diagnosis not present

## 2021-07-31 DIAGNOSIS — I6930 Unspecified sequelae of cerebral infarction: Secondary | ICD-10-CM | POA: Diagnosis not present

## 2021-07-31 DIAGNOSIS — I10 Essential (primary) hypertension: Secondary | ICD-10-CM | POA: Diagnosis not present

## 2021-07-31 DIAGNOSIS — R600 Localized edema: Secondary | ICD-10-CM | POA: Diagnosis not present

## 2021-07-31 DIAGNOSIS — I330 Acute and subacute infective endocarditis: Secondary | ICD-10-CM | POA: Diagnosis not present

## 2021-08-01 ENCOUNTER — Encounter: Payer: Self-pay | Admitting: Physician Assistant

## 2021-08-01 DIAGNOSIS — I63512 Cerebral infarction due to unspecified occlusion or stenosis of left middle cerebral artery: Secondary | ICD-10-CM | POA: Diagnosis not present

## 2021-08-01 DIAGNOSIS — Z7409 Other reduced mobility: Secondary | ICD-10-CM | POA: Diagnosis not present

## 2021-08-01 DIAGNOSIS — S82402D Unspecified fracture of shaft of left fibula, subsequent encounter for closed fracture with routine healing: Secondary | ICD-10-CM | POA: Diagnosis not present

## 2021-08-01 DIAGNOSIS — I330 Acute and subacute infective endocarditis: Secondary | ICD-10-CM | POA: Diagnosis not present

## 2021-08-01 DIAGNOSIS — I480 Paroxysmal atrial fibrillation: Secondary | ICD-10-CM | POA: Diagnosis not present

## 2021-08-03 DIAGNOSIS — D649 Anemia, unspecified: Secondary | ICD-10-CM | POA: Diagnosis not present

## 2021-08-03 DIAGNOSIS — I480 Paroxysmal atrial fibrillation: Secondary | ICD-10-CM | POA: Diagnosis not present

## 2021-08-03 DIAGNOSIS — I059 Rheumatic mitral valve disease, unspecified: Secondary | ICD-10-CM | POA: Diagnosis not present

## 2021-08-03 DIAGNOSIS — I69391 Dysphagia following cerebral infarction: Secondary | ICD-10-CM | POA: Diagnosis not present

## 2021-08-03 DIAGNOSIS — I69320 Aphasia following cerebral infarction: Secondary | ICD-10-CM | POA: Diagnosis not present

## 2021-08-03 DIAGNOSIS — R600 Localized edema: Secondary | ICD-10-CM | POA: Diagnosis not present

## 2021-08-03 DIAGNOSIS — I10 Essential (primary) hypertension: Secondary | ICD-10-CM | POA: Diagnosis not present

## 2021-08-04 DIAGNOSIS — Z7409 Other reduced mobility: Secondary | ICD-10-CM | POA: Diagnosis not present

## 2021-08-04 DIAGNOSIS — I63512 Cerebral infarction due to unspecified occlusion or stenosis of left middle cerebral artery: Secondary | ICD-10-CM | POA: Diagnosis not present

## 2021-08-04 DIAGNOSIS — I480 Paroxysmal atrial fibrillation: Secondary | ICD-10-CM | POA: Diagnosis not present

## 2021-08-04 DIAGNOSIS — I330 Acute and subacute infective endocarditis: Secondary | ICD-10-CM | POA: Diagnosis not present

## 2021-08-04 DIAGNOSIS — S82402A Unspecified fracture of shaft of left fibula, initial encounter for closed fracture: Secondary | ICD-10-CM | POA: Diagnosis not present

## 2021-08-05 DIAGNOSIS — S82402D Unspecified fracture of shaft of left fibula, subsequent encounter for closed fracture with routine healing: Secondary | ICD-10-CM | POA: Diagnosis not present

## 2021-08-05 DIAGNOSIS — Z7409 Other reduced mobility: Secondary | ICD-10-CM | POA: Diagnosis not present

## 2021-08-05 DIAGNOSIS — I63512 Cerebral infarction due to unspecified occlusion or stenosis of left middle cerebral artery: Secondary | ICD-10-CM | POA: Diagnosis not present

## 2021-08-05 DIAGNOSIS — I480 Paroxysmal atrial fibrillation: Secondary | ICD-10-CM | POA: Diagnosis not present

## 2021-08-05 DIAGNOSIS — I330 Acute and subacute infective endocarditis: Secondary | ICD-10-CM | POA: Diagnosis not present

## 2021-08-06 DIAGNOSIS — I69391 Dysphagia following cerebral infarction: Secondary | ICD-10-CM | POA: Diagnosis not present

## 2021-08-06 DIAGNOSIS — R600 Localized edema: Secondary | ICD-10-CM | POA: Diagnosis not present

## 2021-08-06 DIAGNOSIS — I63512 Cerebral infarction due to unspecified occlusion or stenosis of left middle cerebral artery: Secondary | ICD-10-CM | POA: Diagnosis not present

## 2021-08-06 DIAGNOSIS — I69320 Aphasia following cerebral infarction: Secondary | ICD-10-CM | POA: Diagnosis not present

## 2021-08-06 DIAGNOSIS — I330 Acute and subacute infective endocarditis: Secondary | ICD-10-CM | POA: Diagnosis not present

## 2021-08-06 DIAGNOSIS — D649 Anemia, unspecified: Secondary | ICD-10-CM | POA: Diagnosis not present

## 2021-08-06 DIAGNOSIS — S82402D Unspecified fracture of shaft of left fibula, subsequent encounter for closed fracture with routine healing: Secondary | ICD-10-CM | POA: Diagnosis not present

## 2021-08-06 DIAGNOSIS — Z7409 Other reduced mobility: Secondary | ICD-10-CM | POA: Diagnosis not present

## 2021-08-06 DIAGNOSIS — I059 Rheumatic mitral valve disease, unspecified: Secondary | ICD-10-CM | POA: Diagnosis not present

## 2021-08-06 DIAGNOSIS — I10 Essential (primary) hypertension: Secondary | ICD-10-CM | POA: Diagnosis not present

## 2021-08-06 DIAGNOSIS — I480 Paroxysmal atrial fibrillation: Secondary | ICD-10-CM | POA: Diagnosis not present

## 2021-08-07 DIAGNOSIS — I63512 Cerebral infarction due to unspecified occlusion or stenosis of left middle cerebral artery: Secondary | ICD-10-CM | POA: Diagnosis not present

## 2021-08-07 DIAGNOSIS — I480 Paroxysmal atrial fibrillation: Secondary | ICD-10-CM | POA: Diagnosis not present

## 2021-08-07 DIAGNOSIS — Z7409 Other reduced mobility: Secondary | ICD-10-CM | POA: Diagnosis not present

## 2021-08-07 DIAGNOSIS — S82402A Unspecified fracture of shaft of left fibula, initial encounter for closed fracture: Secondary | ICD-10-CM | POA: Diagnosis not present

## 2021-08-07 DIAGNOSIS — I330 Acute and subacute infective endocarditis: Secondary | ICD-10-CM | POA: Diagnosis not present

## 2021-08-08 ENCOUNTER — Inpatient Hospital Stay: Payer: Medicare Other | Admitting: Internal Medicine

## 2021-08-08 DIAGNOSIS — R7881 Bacteremia: Secondary | ICD-10-CM | POA: Diagnosis not present

## 2021-08-08 DIAGNOSIS — I63512 Cerebral infarction due to unspecified occlusion or stenosis of left middle cerebral artery: Secondary | ICD-10-CM | POA: Diagnosis not present

## 2021-08-08 DIAGNOSIS — D649 Anemia, unspecified: Secondary | ICD-10-CM | POA: Diagnosis not present

## 2021-08-08 DIAGNOSIS — I059 Rheumatic mitral valve disease, unspecified: Secondary | ICD-10-CM | POA: Diagnosis not present

## 2021-08-08 DIAGNOSIS — I330 Acute and subacute infective endocarditis: Secondary | ICD-10-CM | POA: Diagnosis not present

## 2021-08-08 DIAGNOSIS — Z7409 Other reduced mobility: Secondary | ICD-10-CM | POA: Diagnosis not present

## 2021-08-08 DIAGNOSIS — S82402D Unspecified fracture of shaft of left fibula, subsequent encounter for closed fracture with routine healing: Secondary | ICD-10-CM | POA: Diagnosis not present

## 2021-08-08 DIAGNOSIS — I480 Paroxysmal atrial fibrillation: Secondary | ICD-10-CM | POA: Diagnosis not present

## 2021-08-08 DIAGNOSIS — I6310 Cerebral infarction due to embolism of unspecified precerebral artery: Secondary | ICD-10-CM | POA: Diagnosis not present

## 2021-08-08 DIAGNOSIS — R600 Localized edema: Secondary | ICD-10-CM | POA: Diagnosis not present

## 2021-08-08 DIAGNOSIS — I10 Essential (primary) hypertension: Secondary | ICD-10-CM | POA: Diagnosis not present

## 2021-08-09 DIAGNOSIS — J9600 Acute respiratory failure, unspecified whether with hypoxia or hypercapnia: Secondary | ICD-10-CM | POA: Diagnosis not present

## 2021-08-09 DIAGNOSIS — I63512 Cerebral infarction due to unspecified occlusion or stenosis of left middle cerebral artery: Secondary | ICD-10-CM | POA: Diagnosis not present

## 2021-08-09 DIAGNOSIS — R1310 Dysphagia, unspecified: Secondary | ICD-10-CM | POA: Diagnosis not present

## 2021-08-09 DIAGNOSIS — R269 Unspecified abnormalities of gait and mobility: Secondary | ICD-10-CM | POA: Diagnosis not present

## 2021-08-09 DIAGNOSIS — M009 Pyogenic arthritis, unspecified: Secondary | ICD-10-CM | POA: Diagnosis not present

## 2021-08-09 LAB — ACID FAST CULTURE WITH REFLEXED SENSITIVITIES (MYCOBACTERIA): Acid Fast Culture: NEGATIVE

## 2021-08-10 DIAGNOSIS — R1310 Dysphagia, unspecified: Secondary | ICD-10-CM | POA: Diagnosis not present

## 2021-08-10 DIAGNOSIS — R269 Unspecified abnormalities of gait and mobility: Secondary | ICD-10-CM | POA: Diagnosis not present

## 2021-08-10 DIAGNOSIS — I63512 Cerebral infarction due to unspecified occlusion or stenosis of left middle cerebral artery: Secondary | ICD-10-CM | POA: Diagnosis not present

## 2021-08-10 DIAGNOSIS — M009 Pyogenic arthritis, unspecified: Secondary | ICD-10-CM | POA: Diagnosis not present

## 2021-08-10 DIAGNOSIS — J9600 Acute respiratory failure, unspecified whether with hypoxia or hypercapnia: Secondary | ICD-10-CM | POA: Diagnosis not present

## 2021-08-11 ENCOUNTER — Other Ambulatory Visit: Payer: Self-pay | Admitting: Thoracic Surgery (Cardiothoracic Vascular Surgery)

## 2021-08-11 DIAGNOSIS — Z9889 Other specified postprocedural states: Secondary | ICD-10-CM

## 2021-08-11 DIAGNOSIS — I63512 Cerebral infarction due to unspecified occlusion or stenosis of left middle cerebral artery: Secondary | ICD-10-CM | POA: Diagnosis not present

## 2021-08-11 DIAGNOSIS — I330 Acute and subacute infective endocarditis: Secondary | ICD-10-CM | POA: Diagnosis not present

## 2021-08-11 DIAGNOSIS — S82402D Unspecified fracture of shaft of left fibula, subsequent encounter for closed fracture with routine healing: Secondary | ICD-10-CM | POA: Diagnosis not present

## 2021-08-11 DIAGNOSIS — Z7409 Other reduced mobility: Secondary | ICD-10-CM | POA: Diagnosis not present

## 2021-08-11 DIAGNOSIS — I480 Paroxysmal atrial fibrillation: Secondary | ICD-10-CM | POA: Diagnosis not present

## 2021-08-12 ENCOUNTER — Ambulatory Visit: Payer: Self-pay | Admitting: Thoracic Surgery (Cardiothoracic Vascular Surgery)

## 2021-08-12 DIAGNOSIS — Z471 Aftercare following joint replacement surgery: Secondary | ICD-10-CM | POA: Diagnosis not present

## 2021-08-12 DIAGNOSIS — I38 Endocarditis, valve unspecified: Secondary | ICD-10-CM | POA: Diagnosis not present

## 2021-08-12 DIAGNOSIS — I69391 Dysphagia following cerebral infarction: Secondary | ICD-10-CM | POA: Diagnosis not present

## 2021-08-12 DIAGNOSIS — I63412 Cerebral infarction due to embolism of left middle cerebral artery: Secondary | ICD-10-CM | POA: Diagnosis not present

## 2021-08-12 DIAGNOSIS — Z954 Presence of other heart-valve replacement: Secondary | ICD-10-CM | POA: Diagnosis not present

## 2021-08-12 DIAGNOSIS — R570 Cardiogenic shock: Secondary | ICD-10-CM | POA: Diagnosis not present

## 2021-08-12 DIAGNOSIS — I63512 Cerebral infarction due to unspecified occlusion or stenosis of left middle cerebral artery: Secondary | ICD-10-CM | POA: Diagnosis not present

## 2021-08-12 DIAGNOSIS — D62 Acute posthemorrhagic anemia: Secondary | ICD-10-CM | POA: Diagnosis not present

## 2021-08-12 DIAGNOSIS — R4701 Aphasia: Secondary | ICD-10-CM | POA: Diagnosis not present

## 2021-08-12 DIAGNOSIS — R2689 Other abnormalities of gait and mobility: Secondary | ICD-10-CM | POA: Diagnosis not present

## 2021-08-12 DIAGNOSIS — E876 Hypokalemia: Secondary | ICD-10-CM | POA: Diagnosis not present

## 2021-08-12 DIAGNOSIS — S82402D Unspecified fracture of shaft of left fibula, subsequent encounter for closed fracture with routine healing: Secondary | ICD-10-CM | POA: Diagnosis not present

## 2021-08-12 DIAGNOSIS — R600 Localized edema: Secondary | ICD-10-CM | POA: Diagnosis not present

## 2021-08-12 DIAGNOSIS — M6281 Muscle weakness (generalized): Secondary | ICD-10-CM | POA: Diagnosis not present

## 2021-08-12 DIAGNOSIS — Z7409 Other reduced mobility: Secondary | ICD-10-CM | POA: Diagnosis not present

## 2021-08-12 DIAGNOSIS — S8991XD Unspecified injury of right lower leg, subsequent encounter: Secondary | ICD-10-CM | POA: Diagnosis not present

## 2021-08-12 DIAGNOSIS — Z4789 Encounter for other orthopedic aftercare: Secondary | ICD-10-CM | POA: Diagnosis not present

## 2021-08-12 DIAGNOSIS — I4891 Unspecified atrial fibrillation: Secondary | ICD-10-CM | POA: Diagnosis not present

## 2021-08-12 DIAGNOSIS — I69354 Hemiplegia and hemiparesis following cerebral infarction affecting left non-dominant side: Secondary | ICD-10-CM | POA: Diagnosis not present

## 2021-08-12 DIAGNOSIS — B9561 Methicillin susceptible Staphylococcus aureus infection as the cause of diseases classified elsewhere: Secondary | ICD-10-CM | POA: Diagnosis not present

## 2021-08-12 DIAGNOSIS — R1310 Dysphagia, unspecified: Secondary | ICD-10-CM | POA: Diagnosis not present

## 2021-08-12 DIAGNOSIS — J9601 Acute respiratory failure with hypoxia: Secondary | ICD-10-CM | POA: Diagnosis not present

## 2021-08-12 DIAGNOSIS — I890 Lymphedema, not elsewhere classified: Secondary | ICD-10-CM | POA: Diagnosis not present

## 2021-08-12 DIAGNOSIS — Z741 Need for assistance with personal care: Secondary | ICD-10-CM | POA: Diagnosis not present

## 2021-08-12 DIAGNOSIS — I330 Acute and subacute infective endocarditis: Secondary | ICD-10-CM | POA: Diagnosis not present

## 2021-08-12 DIAGNOSIS — M00062 Staphylococcal arthritis, left knee: Secondary | ICD-10-CM | POA: Diagnosis not present

## 2021-08-12 DIAGNOSIS — R269 Unspecified abnormalities of gait and mobility: Secondary | ICD-10-CM | POA: Diagnosis not present

## 2021-08-12 DIAGNOSIS — I480 Paroxysmal atrial fibrillation: Secondary | ICD-10-CM | POA: Diagnosis not present

## 2021-08-14 DIAGNOSIS — D62 Acute posthemorrhagic anemia: Secondary | ICD-10-CM | POA: Diagnosis not present

## 2021-08-14 DIAGNOSIS — I4891 Unspecified atrial fibrillation: Secondary | ICD-10-CM | POA: Diagnosis not present

## 2021-08-14 DIAGNOSIS — I330 Acute and subacute infective endocarditis: Secondary | ICD-10-CM | POA: Diagnosis not present

## 2021-08-14 DIAGNOSIS — R600 Localized edema: Secondary | ICD-10-CM | POA: Diagnosis not present

## 2021-08-14 DIAGNOSIS — E876 Hypokalemia: Secondary | ICD-10-CM | POA: Diagnosis not present

## 2021-08-14 DIAGNOSIS — R2689 Other abnormalities of gait and mobility: Secondary | ICD-10-CM | POA: Diagnosis not present

## 2021-08-14 DIAGNOSIS — J9601 Acute respiratory failure with hypoxia: Secondary | ICD-10-CM | POA: Diagnosis not present

## 2021-08-18 DIAGNOSIS — I69391 Dysphagia following cerebral infarction: Secondary | ICD-10-CM | POA: Diagnosis not present

## 2021-08-18 DIAGNOSIS — I4891 Unspecified atrial fibrillation: Secondary | ICD-10-CM | POA: Diagnosis not present

## 2021-08-19 ENCOUNTER — Encounter (HOSPITAL_COMMUNITY): Payer: Self-pay | Admitting: Thoracic Surgery (Cardiothoracic Vascular Surgery)

## 2021-08-19 ENCOUNTER — Telehealth (HOSPITAL_COMMUNITY): Payer: Self-pay | Admitting: Cardiology

## 2021-08-19 NOTE — Telephone Encounter (Signed)
I called patient to remind her of the echocardiogram appointment that was scheduled for 08/19/21. Mother states  patient is in a facility and mother states she will have to call back to reschedule. Order will be removed and when they call back to reschedule we will reinstate the order. Thank you

## 2021-08-20 ENCOUNTER — Ambulatory Visit (HOSPITAL_COMMUNITY): Payer: Medicare Other

## 2021-08-28 DIAGNOSIS — R600 Localized edema: Secondary | ICD-10-CM | POA: Diagnosis not present

## 2021-08-28 DIAGNOSIS — I4891 Unspecified atrial fibrillation: Secondary | ICD-10-CM | POA: Diagnosis not present

## 2021-08-28 DIAGNOSIS — J9601 Acute respiratory failure with hypoxia: Secondary | ICD-10-CM | POA: Diagnosis not present

## 2021-08-28 DIAGNOSIS — I330 Acute and subacute infective endocarditis: Secondary | ICD-10-CM | POA: Diagnosis not present

## 2021-08-29 DIAGNOSIS — Z13228 Encounter for screening for other metabolic disorders: Secondary | ICD-10-CM | POA: Diagnosis not present

## 2021-08-29 DIAGNOSIS — E559 Vitamin D deficiency, unspecified: Secondary | ICD-10-CM | POA: Diagnosis not present

## 2021-09-01 ENCOUNTER — Telehealth: Payer: Self-pay

## 2021-09-01 DIAGNOSIS — E559 Vitamin D deficiency, unspecified: Secondary | ICD-10-CM | POA: Diagnosis not present

## 2021-09-01 DIAGNOSIS — E119 Type 2 diabetes mellitus without complications: Secondary | ICD-10-CM | POA: Diagnosis not present

## 2021-09-01 NOTE — Telephone Encounter (Signed)
Patient's mother called to schedule appointment now that Debra Klein is out of the hospital.   Debra Klein's mother to notify facility, [facility], of patient's appointment time to arrange for transportation.   Patient's mom reports that PICC line has been removed.   Beryle Flock, RN

## 2021-09-03 DIAGNOSIS — I69391 Dysphagia following cerebral infarction: Secondary | ICD-10-CM | POA: Diagnosis not present

## 2021-09-03 DIAGNOSIS — I4891 Unspecified atrial fibrillation: Secondary | ICD-10-CM | POA: Diagnosis not present

## 2021-09-09 ENCOUNTER — Telehealth: Payer: Self-pay

## 2021-09-09 ENCOUNTER — Inpatient Hospital Stay: Payer: Medicare Other | Admitting: Internal Medicine

## 2021-09-09 NOTE — Telephone Encounter (Signed)
Mother came by upset about facility NO showing todays visit. (09/09/21 [place]). She requested orders for [facility] to come in for bathing and care giving. Advised to contact PCP and speak to DON @ facility to file complaint. She is very unsatisfied with the level of care and lack of care given at facility where patient is residing. She understands process and will speak with PCP today.

## 2021-09-10 DIAGNOSIS — E559 Vitamin D deficiency, unspecified: Secondary | ICD-10-CM | POA: Diagnosis not present

## 2021-09-10 DIAGNOSIS — E119 Type 2 diabetes mellitus without complications: Secondary | ICD-10-CM | POA: Diagnosis not present

## 2021-09-10 DIAGNOSIS — Z13228 Encounter for screening for other metabolic disorders: Secondary | ICD-10-CM | POA: Diagnosis not present

## 2021-09-11 DIAGNOSIS — S82402D Unspecified fracture of shaft of left fibula, subsequent encounter for closed fracture with routine healing: Secondary | ICD-10-CM | POA: Diagnosis not present

## 2021-09-11 DIAGNOSIS — J9601 Acute respiratory failure with hypoxia: Secondary | ICD-10-CM | POA: Diagnosis not present

## 2021-09-11 DIAGNOSIS — M6281 Muscle weakness (generalized): Secondary | ICD-10-CM | POA: Diagnosis not present

## 2021-09-11 DIAGNOSIS — E119 Type 2 diabetes mellitus without complications: Secondary | ICD-10-CM | POA: Diagnosis not present

## 2021-09-11 DIAGNOSIS — R2689 Other abnormalities of gait and mobility: Secondary | ICD-10-CM | POA: Diagnosis not present

## 2021-10-12 DIAGNOSIS — I69391 Dysphagia following cerebral infarction: Secondary | ICD-10-CM | POA: Diagnosis not present

## 2021-10-12 DIAGNOSIS — Z9181 History of falling: Secondary | ICD-10-CM | POA: Diagnosis not present

## 2021-10-12 DIAGNOSIS — I69398 Other sequelae of cerebral infarction: Secondary | ICD-10-CM | POA: Diagnosis not present

## 2021-10-12 DIAGNOSIS — E785 Hyperlipidemia, unspecified: Secondary | ICD-10-CM | POA: Diagnosis not present

## 2021-10-12 DIAGNOSIS — Z992 Dependence on renal dialysis: Secondary | ICD-10-CM | POA: Diagnosis not present

## 2021-10-12 DIAGNOSIS — I69320 Aphasia following cerebral infarction: Secondary | ICD-10-CM | POA: Diagnosis not present

## 2021-10-12 DIAGNOSIS — N179 Acute kidney failure, unspecified: Secondary | ICD-10-CM | POA: Diagnosis not present

## 2021-10-12 DIAGNOSIS — E876 Hypokalemia: Secondary | ICD-10-CM | POA: Diagnosis not present

## 2021-10-12 DIAGNOSIS — I69351 Hemiplegia and hemiparesis following cerebral infarction affecting right dominant side: Secondary | ICD-10-CM | POA: Diagnosis not present

## 2021-10-12 DIAGNOSIS — K5900 Constipation, unspecified: Secondary | ICD-10-CM | POA: Diagnosis not present

## 2021-10-12 DIAGNOSIS — I4891 Unspecified atrial fibrillation: Secondary | ICD-10-CM | POA: Diagnosis not present

## 2021-10-12 DIAGNOSIS — I10 Essential (primary) hypertension: Secondary | ICD-10-CM | POA: Diagnosis not present

## 2021-10-12 DIAGNOSIS — S81811D Laceration without foreign body, right lower leg, subsequent encounter: Secondary | ICD-10-CM | POA: Diagnosis not present

## 2021-10-12 DIAGNOSIS — M179 Osteoarthritis of knee, unspecified: Secondary | ICD-10-CM | POA: Diagnosis not present

## 2021-10-12 DIAGNOSIS — D649 Anemia, unspecified: Secondary | ICD-10-CM | POA: Diagnosis not present

## 2021-10-12 DIAGNOSIS — S82402D Unspecified fracture of shaft of left fibula, subsequent encounter for closed fracture with routine healing: Secondary | ICD-10-CM | POA: Diagnosis not present

## 2021-10-14 ENCOUNTER — Ambulatory Visit (INDEPENDENT_AMBULATORY_CARE_PROVIDER_SITE_OTHER): Payer: Self-pay | Admitting: Thoracic Surgery (Cardiothoracic Vascular Surgery)

## 2021-10-14 ENCOUNTER — Ambulatory Visit
Admission: RE | Admit: 2021-10-14 | Discharge: 2021-10-14 | Disposition: A | Discharge status: Home / Self Care | Payer: Medicare Other | Source: Ambulatory Visit | Attending: Thoracic Surgery (Cardiothoracic Vascular Surgery) | Admitting: Thoracic Surgery (Cardiothoracic Vascular Surgery)

## 2021-10-14 VITALS — BP 65/49 | HR 95 | Resp 20 | Ht 66.0 in | Wt 160.0 lb

## 2021-10-14 DIAGNOSIS — Z9889 Other specified postprocedural states: Secondary | ICD-10-CM

## 2021-10-14 DIAGNOSIS — R079 Chest pain, unspecified: Secondary | ICD-10-CM | POA: Diagnosis not present

## 2021-10-14 NOTE — Progress Notes (Signed)
[address]       [place] [address]             [phone]     HPI: [name] returns for a postoperative follow-up visit after mitral valve repair for endocarditis.  Debra Klein is a 46 year old woman with a history of hypertension, hyperlipidemia, anemia, and schizophrenia.  She presented in June with altered mental status, general malaise, anorexia, and multiple falls.  She was found to have a left MCA infarct and septic knee joint.  She had mitral valve endocarditis.  Cultures grew methicillin sensitive Staph aureus.  I did a mitral valve repair on 06/27/2021.  She had a long postoperative course as expected.  She was finally discharged on 07/17/2021 to [facility] inpatient rehab.  She then went to a facility called [facility].  Her mother recently took her out of that facility and now has her at home.  Speech very limited.  Mother states she does sometimes complain of chest discomfort.  No fevers, chills, or sweats.  P.o. intake has been poor.  Not drinking fluids well.  Past Medical History:  Diagnosis Date   Hyperlipemia    Hypertension    IDA (iron deficiency anemia)    Schizophrenia (HCC)     Current Outpatient Medications  Medication Sig Dispense Refill   aspirin 81 MG chewable tablet Chew 1 tablet (81 mg total) by mouth daily.     atorvastatin (LIPITOR) 20 MG tablet Take 20 mg by mouth daily.     benztropine (COGENTIN) 1 MG tablet Take 1 mg by mouth at bedtime.     cloZAPine (CLOZARIL) 50 MG tablet Take 1 tablet (50 mg total) by mouth at bedtime.     fluPHENAZine (PROLIXIN) 1 MG tablet Take 1 tablet (1 mg total) by mouth at bedtime.     iron polysaccharides (NIFEREX) 150 MG capsule Take 150 mg by mouth 2 (two) times daily.     meloxicam (MOBIC) 15 MG tablet TAKE 1 TABLET(15 MG) BY MOUTH DAILY AS NEEDED FOR PAIN (Patient taking differently: Take 15 mg by mouth daily as needed for pain.) 30 tablet 3   metoprolol tartrate (LOPRESSOR) 25 MG tablet Take  0.5 tablets (12.5 mg total) by mouth 2 (two) times daily.     pantoprazole (PROTONIX) 40 MG tablet Take 1 tablet (40 mg total) by mouth daily.     potassium chloride (KLOR-CON) 20 MEQ packet Take 40 mEq by mouth daily.     vitamin B-12 1000 MCG tablet Take 1 tablet (1,000 mcg total) by mouth daily.     amiodarone (PACERONE) 200 MG tablet Take 1 tablet (200 mg total) by mouth daily for 24 days.     oxyCODONE (OXY IR/ROXICODONE) 5 MG immediate release tablet Place 1-2 tablets (5-10 mg total) into feeding tube every 3 (three) hours as needed for severe pain. (Patient not taking: Reported on 10/14/2021) 30 tablet 0   No current facility-administered medications for this visit.    Physical Exam BP (!) 65/49 (BP Location: Left Arm, Cuff Size: Normal)   Pulse 95   Resp 20   Ht 5\' 6"  (1.676 m)   Wt 160 lb (72.6 kg)   SpO2 100% Comment: RA  BMI 25.82 kg/m  Repeat BP 76/43 46 year old woman in no acute distress Slow response, minimal verbal interaction, but appropriate Does move all extremities, generalized weakness right greater than left Cardiac regular rate and rhythm no audible murmur Lungs clear with equal breath  sounds bilaterally Sternum stable, incision well-healed No peripheral edema  Diagnostic Tests: CHEST - 2 VIEW   COMPARISON:  Chest radiograph 07/07/2021   FINDINGS: Median sternotomy wires and a mitral valve prosthesis are stable. The cardiomediastinal silhouette is within normal limits   There is no focal consolidation or pulmonary edema. There is no pleural effusion or pneumothorax. Compared to the study from 07/07/2021, previously seen airspace opacity and pleural effusions have resolved.   There is no acute osseous abnormality.   IMPRESSION: No radiographic evidence of acute cardiopulmonary process.     Electronically Signed   By: Valetta Mole M.D.   On: 10/14/2021 15:15  Impression: Debra Klein is a 46 year old woman recently hospitalized after an acute  MCA stroke with right hemiparesis and expressive aphasia due to embolization from mitral valve endocarditis.  She underwent mitral valve repair back in June.  Postoperative course had slow progress as expected but overall she did reasonably well.  She went to inpatient rehab and then was in another facility.  Her mother is now taking her home.  She has been seen by home health nurse and they are awaiting home physical therapy.  Her blood pressure is low today which I suspect is dehydration.  It was extremely low when first checked but was improved after she had gone to the bathroom.  Her p.o. intake has been poor.  I recommended they really encouraged her to take fluids.  If she has any worsening of her condition she should go to the emergency department immediately.  She does need to see cardiology in follow-up.  We will help her arrange that appointment.  She will need an echocardiogram.  Plan: Encourage fluid intake Follow-up with cardiology I will be happy to see her back at anytime if I can be of any further assistance with her care  Melrose Nakayama, MD [facility] [phone]

## 2021-10-15 ENCOUNTER — Telehealth: Payer: Self-pay | Admitting: Cardiology

## 2021-10-15 ENCOUNTER — Ambulatory Visit: Payer: Medicare Other | Admitting: Physician Assistant

## 2021-10-15 ENCOUNTER — Emergency Department (HOSPITAL_COMMUNITY): Payer: Medicare Other

## 2021-10-15 ENCOUNTER — Encounter (HOSPITAL_COMMUNITY): Payer: Self-pay | Admitting: Emergency Medicine

## 2021-10-15 ENCOUNTER — Other Ambulatory Visit: Payer: Self-pay

## 2021-10-15 ENCOUNTER — Emergency Department (HOSPITAL_COMMUNITY)
Admission: EM | Admit: 2021-10-15 | Discharge: 2021-10-16 | Disposition: A | Discharge status: Home / Self Care | Payer: Medicare Other | Attending: Emergency Medicine | Admitting: Emergency Medicine

## 2021-10-15 DIAGNOSIS — I4891 Unspecified atrial fibrillation: Secondary | ICD-10-CM | POA: Diagnosis not present

## 2021-10-15 DIAGNOSIS — Z9181 History of falling: Secondary | ICD-10-CM | POA: Diagnosis not present

## 2021-10-15 DIAGNOSIS — I69320 Aphasia following cerebral infarction: Secondary | ICD-10-CM | POA: Diagnosis not present

## 2021-10-15 DIAGNOSIS — Z7982 Long term (current) use of aspirin: Secondary | ICD-10-CM | POA: Diagnosis not present

## 2021-10-15 DIAGNOSIS — K5900 Constipation, unspecified: Secondary | ICD-10-CM | POA: Diagnosis not present

## 2021-10-15 DIAGNOSIS — I10 Essential (primary) hypertension: Secondary | ICD-10-CM | POA: Diagnosis not present

## 2021-10-15 DIAGNOSIS — R109 Unspecified abdominal pain: Secondary | ICD-10-CM | POA: Diagnosis not present

## 2021-10-15 DIAGNOSIS — I69398 Other sequelae of cerebral infarction: Secondary | ICD-10-CM | POA: Diagnosis not present

## 2021-10-15 DIAGNOSIS — R7989 Other specified abnormal findings of blood chemistry: Secondary | ICD-10-CM | POA: Insufficient documentation

## 2021-10-15 DIAGNOSIS — R8281 Pyuria: Secondary | ICD-10-CM | POA: Diagnosis not present

## 2021-10-15 DIAGNOSIS — N390 Urinary tract infection, site not specified: Secondary | ICD-10-CM | POA: Diagnosis not present

## 2021-10-15 DIAGNOSIS — R9431 Abnormal electrocardiogram [ECG] [EKG]: Secondary | ICD-10-CM | POA: Diagnosis not present

## 2021-10-15 DIAGNOSIS — Z79899 Other long term (current) drug therapy: Secondary | ICD-10-CM | POA: Insufficient documentation

## 2021-10-15 DIAGNOSIS — S81811D Laceration without foreign body, right lower leg, subsequent encounter: Secondary | ICD-10-CM | POA: Diagnosis not present

## 2021-10-15 DIAGNOSIS — Z743 Need for continuous supervision: Secondary | ICD-10-CM | POA: Diagnosis not present

## 2021-10-15 DIAGNOSIS — M179 Osteoarthritis of knee, unspecified: Secondary | ICD-10-CM | POA: Diagnosis not present

## 2021-10-15 DIAGNOSIS — I69351 Hemiplegia and hemiparesis following cerebral infarction affecting right dominant side: Secondary | ICD-10-CM | POA: Diagnosis not present

## 2021-10-15 DIAGNOSIS — R6889 Other general symptoms and signs: Secondary | ICD-10-CM | POA: Diagnosis not present

## 2021-10-15 DIAGNOSIS — E876 Hypokalemia: Secondary | ICD-10-CM | POA: Diagnosis not present

## 2021-10-15 DIAGNOSIS — I959 Hypotension, unspecified: Secondary | ICD-10-CM | POA: Diagnosis not present

## 2021-10-15 DIAGNOSIS — N179 Acute kidney failure, unspecified: Secondary | ICD-10-CM | POA: Diagnosis not present

## 2021-10-15 DIAGNOSIS — R531 Weakness: Secondary | ICD-10-CM | POA: Diagnosis not present

## 2021-10-15 DIAGNOSIS — D649 Anemia, unspecified: Secondary | ICD-10-CM | POA: Diagnosis not present

## 2021-10-15 DIAGNOSIS — E785 Hyperlipidemia, unspecified: Secondary | ICD-10-CM | POA: Diagnosis not present

## 2021-10-15 DIAGNOSIS — Z992 Dependence on renal dialysis: Secondary | ICD-10-CM | POA: Diagnosis not present

## 2021-10-15 DIAGNOSIS — S82402D Unspecified fracture of shaft of left fibula, subsequent encounter for closed fracture with routine healing: Secondary | ICD-10-CM | POA: Diagnosis not present

## 2021-10-15 DIAGNOSIS — I69391 Dysphagia following cerebral infarction: Secondary | ICD-10-CM | POA: Diagnosis not present

## 2021-10-15 LAB — CBC WITH DIFFERENTIAL/PLATELET
Abs Immature Granulocytes: 0.02 10*3/uL (ref 0.00–0.07)
Basophils Absolute: 0 10*3/uL (ref 0.0–0.1)
Basophils Relative: 0 %
Eosinophils Absolute: 0 10*3/uL (ref 0.0–0.5)
Eosinophils Relative: 0 %
HCT: 33.5 % — ABNORMAL LOW (ref 36.0–46.0)
Hemoglobin: 10.5 g/dL — ABNORMAL LOW (ref 12.0–15.0)
Immature Granulocytes: 0 %
Lymphocytes Relative: 19 %
Lymphs Abs: 1.6 10*3/uL (ref 0.7–4.0)
MCH: 28.8 pg (ref 26.0–34.0)
MCHC: 31.3 g/dL (ref 30.0–36.0)
MCV: 91.8 fL (ref 80.0–100.0)
Monocytes Absolute: 0.6 10*3/uL (ref 0.1–1.0)
Monocytes Relative: 8 %
Neutro Abs: 6.1 10*3/uL (ref 1.7–7.7)
Neutrophils Relative %: 73 %
Platelets: 196 10*3/uL (ref 150–400)
RBC: 3.65 MIL/uL — ABNORMAL LOW (ref 3.87–5.11)
RDW: 14.8 % (ref 11.5–15.5)
WBC: 8.3 10*3/uL (ref 4.0–10.5)
nRBC: 0 % (ref 0.0–0.2)

## 2021-10-15 LAB — URINALYSIS, ROUTINE W REFLEX MICROSCOPIC
Bilirubin Urine: NEGATIVE
Glucose, UA: NEGATIVE mg/dL
Hgb urine dipstick: NEGATIVE
Ketones, ur: NEGATIVE mg/dL
Nitrite: NEGATIVE
Protein, ur: 100 mg/dL — AB
Specific Gravity, Urine: 1.025 (ref 1.005–1.030)
WBC, UA: >50 WBC/hpf — ABNORMAL HIGH (ref 0–5)
pH: 5 (ref 5.0–8.0)

## 2021-10-15 LAB — LACTIC ACID, PLASMA: Lactic Acid, Venous: 1.9 mmol/L (ref 0.5–1.9)

## 2021-10-15 LAB — COMPREHENSIVE METABOLIC PANEL
ALT: 9 U/L (ref 0–44)
AST: 13 U/L — ABNORMAL LOW (ref 15–41)
Albumin: 3.5 g/dL (ref 3.5–5.0)
Alkaline Phosphatase: 55 U/L (ref 38–126)
Anion gap: 9 (ref 5–15)
BUN: 18 mg/dL (ref 6–20)
CO2: 22 mmol/L (ref 22–32)
Calcium: 9.9 mg/dL (ref 8.9–10.3)
Chloride: 107 mmol/L (ref 98–111)
Creatinine, Ser: 1.53 mg/dL — ABNORMAL HIGH (ref 0.44–1.00)
GFR, Estimated: 42 mL/min — ABNORMAL LOW (ref >60)
Glucose, Bld: 109 mg/dL — ABNORMAL HIGH (ref 70–99)
Potassium: 4.2 mmol/L (ref 3.5–5.1)
Sodium: 138 mmol/L (ref 135–145)
Total Bilirubin: 0.8 mg/dL (ref 0.3–1.2)
Total Protein: 7.1 g/dL (ref 6.5–8.1)

## 2021-10-15 LAB — CK: Total CK: 44 U/L (ref 38–234)

## 2021-10-15 LAB — TROPONIN I (HIGH SENSITIVITY)
Troponin I (High Sensitivity): 19 ng/L — ABNORMAL HIGH (ref <18)
Troponin I (High Sensitivity): 19 ng/L — ABNORMAL HIGH (ref <18)

## 2021-10-15 LAB — BRAIN NATRIURETIC PEPTIDE: B Natriuretic Peptide: 57.9 pg/mL (ref 0.0–100.0)

## 2021-10-15 MED ORDER — SODIUM CHLORIDE 0.9 % IV BOLUS
500.0000 mL | Freq: Once | INTRAVENOUS | Status: AC
  Administered 2021-10-15: 500 mL via INTRAVENOUS

## 2021-10-15 MED ORDER — NITROFURANTOIN MONOHYD MACRO 100 MG PO CAPS
100.0000 mg | ORAL_CAPSULE | Freq: Once | ORAL | Status: AC
  Administered 2021-10-16: 100 mg via ORAL
  Filled 2021-10-15: qty 1

## 2021-10-15 MED ORDER — NITROFURANTOIN MONOHYD MACRO 100 MG PO CAPS: 100.0000 mg | ORAL_CAPSULE | Freq: Two times a day (BID) | ORAL | 0 refills | Status: DC

## 2021-10-15 NOTE — Telephone Encounter (Signed)
Pt c/o BP issue: STAT if pt c/o blurred vision, one-sided weakness or slurred speech  1. What are your last 5 BP readings?  65/49 80/70 70/62   2. Are you having any other symptoms (ex. Dizziness, headache, blurred vision, passed out)? Feeling cold and sleepy   3. What is your BP issue? Pt bp is low. Toney Reil wanted to know if pt should go to the ER since bp is so  low. Called triage and was advised to tell the pt to go to the ER.

## 2021-10-15 NOTE — ED Notes (Signed)
Urine collected from bedpan sample.

## 2021-10-15 NOTE — ED Notes (Signed)
Pt has two ulcers on the right foot.  One on the anterior and one on the heel of the foot.  Per mother Debra Klein pt acquired these two sores at the facility she was previously to going back home with family.

## 2021-10-15 NOTE — ED Notes (Signed)
MD Jud aware that pt is a extremely hard stick.  [facility], this RN and other RN's tried for IV access with no success.  IV team consult placed STAT.

## 2021-10-15 NOTE — Discharge Instructions (Addendum)
Evaluation for your hypotension was overall reassuring.  Likely related to hypovolemia.  Of note your creatinine was elevated which can be a marker for declining kidney function.  Recommend that you follow-up with your PCP for possible redraw all of that lab in the next couple of days.  If it continues to trend up, please return to the emergency department for further evaluation.  Urinalysis also revealed concerns for UTI.  Starting on nitrofurantoin which is an antibiotic for treatment.  Please take the entire 5-day course.

## 2021-10-15 NOTE — ED Provider Notes (Signed)
[facility] EMERGENCY DEPARTMENT Provider Note   CSN: [id number] Arrival date & time: 10/15/21  1417     History  Chief Complaint  Patient presents with   Hypotension   HPI Debra Klein is a 46 y.o. female with s/p mitral valve repair in June 2023, CVA due to embolic occlusion of the MCA that occurred 2 months ago, schizophrenia presenting for hypertension. States that today she was scheduled to meet with her physical therapist.  Vitals at that time revealed systolic BP in the 93O.  EMS was called and she was transferred here to the emergency department for further evaluation.  Denies chest pain, shortness of breath, abdominal pain, headache, and/or blurry vision.  Did state that she vomited once today after eating ice cream.  Mother stated that she "is not great at staying hydrated".   HPI     Home Medications Prior to Admission medications   Medication Sig Start Date End Date Taking? Authorizing Provider  nitrofurantoin, macrocrystal-monohydrate, (MACROBID) 100 MG capsule Take 1 capsule (100 mg total) by mouth 2 (two) times daily. 10/15/21  Yes Harriet Pho, PA-C  amiodarone (PACERONE) 200 MG tablet Take 1 tablet (200 mg total) by mouth daily for 24 days. 07/18/21 08/11/21  Shelly Coss, MD  aspirin 81 MG chewable tablet Chew 1 tablet (81 mg total) by mouth daily. 07/18/21   Shelly Coss, MD  atorvastatin (LIPITOR) 20 MG tablet Take 20 mg by mouth daily. 06/16/20   [provider]  benztropine (COGENTIN) 1 MG tablet Take 1 mg by mouth at bedtime. 05/08/20   [provider]  cloZAPine (CLOZARIL) 50 MG tablet Take 1 tablet (50 mg total) by mouth at bedtime. 07/17/21   Shelly Coss, MD  fluPHENAZine (PROLIXIN) 1 MG tablet Take 1 tablet (1 mg total) by mouth at bedtime. 07/17/21   Shelly Coss, MD  iron polysaccharides (NIFEREX) 150 MG capsule Take 150 mg by mouth 2 (two) times daily. 05/30/20   [provider]  meloxicam (MOBIC) 15 MG  tablet TAKE 1 TABLET(15 MG) BY MOUTH DAILY AS NEEDED FOR PAIN Patient taking differently: Take 15 mg by mouth daily as needed for pain. 05/30/21   Mcarthur Rossetti, MD  metoprolol tartrate (LOPRESSOR) 25 MG tablet Take 0.5 tablets (12.5 mg total) by mouth 2 (two) times daily. 07/17/21   Shelly Coss, MD  oxyCODONE (OXY IR/ROXICODONE) 5 MG immediate release tablet Place 1-2 tablets (5-10 mg total) into feeding tube every 3 (three) hours as needed for severe pain. 07/17/21   Shelly Coss, MD  pantoprazole (PROTONIX) 40 MG tablet Take 1 tablet (40 mg total) by mouth daily. 07/18/21   Shelly Coss, MD  potassium chloride (KLOR-CON) 20 MEQ packet Take 40 mEq by mouth daily. 07/18/21   Shelly Coss, MD  vitamin B-12 1000 MCG tablet Take 1 tablet (1,000 mcg total) by mouth daily. 07/18/21   Shelly Coss, MD      Allergies    Patient has no known allergies.    Review of Systems   Review of Systems  Physical Exam Updated Vital Signs BP (!) 91/57   Pulse 75   Temp 97.7 F (36.5 C) (Oral)   Resp 14   Ht 5\' 6"  (1.676 m)   Wt 72.6 kg   SpO2 100%   BMI 25.83 kg/m  Physical Exam Vitals and nursing note reviewed.  HENT:     Head: Normocephalic and atraumatic.     Mouth/Throat:     Mouth: Mucous membranes  are moist.  Eyes:     General:        Right eye: No discharge.        Left eye: No discharge.     Conjunctiva/sclera: Conjunctivae normal.  Cardiovascular:     Rate and Rhythm: Normal rate and regular rhythm.     Pulses: Normal pulses.     Heart sounds: Normal heart sounds. No murmur heard.    No friction rub. No gallop.  Pulmonary:     Effort: Pulmonary effort is normal.     Breath sounds: Normal breath sounds.  Chest:  Breasts:    Left: Swelling present.     Comments: Healed midsternal incision Abdominal:     General: Abdomen is flat.     Palpations: Abdomen is soft.  Skin:    General: Skin is warm and dry.     Capillary Refill: Capillary refill takes 2 to 3  seconds.  Neurological:     General: No focal deficit present.  Psychiatric:        Mood and Affect: Mood normal.     ED Results / Procedures / Treatments   Labs (all labs ordered are listed, but only abnormal results are displayed) Labs Reviewed  COMPREHENSIVE METABOLIC PANEL - Abnormal; Notable for the following components:      Result Value   Glucose, Bld 109 (*)    Creatinine, Ser 1.53 (*)    AST 13 (*)    GFR, Estimated 42 (*)    All other components within normal limits  CBC WITH DIFFERENTIAL/PLATELET - Abnormal; Notable for the following components:   RBC 3.65 (*)    Hemoglobin 10.5 (*)    HCT 33.5 (*)    All other components within normal limits  URINALYSIS, ROUTINE W REFLEX MICROSCOPIC - Abnormal; Notable for the following components:   Color, Urine AMBER (*)    APPearance CLOUDY (*)    Protein, ur 100 (*)    Leukocytes,Ua LARGE (*)    WBC, UA >50 (*)    Bacteria, UA RARE (*)    All other components within normal limits  TROPONIN I (HIGH SENSITIVITY) - Abnormal; Notable for the following components:   Troponin I (High Sensitivity) 19 (*)    All other components within normal limits  TROPONIN I (HIGH SENSITIVITY) - Abnormal; Notable for the following components:   Troponin I (High Sensitivity) 19 (*)    All other components within normal limits  CULTURE, BLOOD (ROUTINE X 2)  CULTURE, BLOOD (ROUTINE X 2)  LACTIC ACID, PLASMA  BRAIN NATRIURETIC PEPTIDE  CK  LACTIC ACID, PLASMA    EKG EKG Interpretation  Date/Time:  Wednesday October 15 2021 15:50:20 EDT Ventricular Rate:  78 PR Interval:  116 QRS Duration: 78 QT Interval:  418 QTC Calculation: 477 R Axis:   76 Text Interpretation: Sinus rhythm Borderline short PR interval Probable left atrial enlargement Abnrm T, consider ischemia, anterolateral lds Seen before, June 2023. ST elev, probable normal early repol pattern Confirmed by Tretha Sciara [phone]) on 10/15/2021 8:45:31 PM  Radiology DG Chest  Port 1 View  Result Date: 10/15/2021 CLINICAL DATA:  Hypotension EXAM: PORTABLE CHEST 1 VIEW COMPARISON:  10/14/2021 FINDINGS: Cardiac size is within normal limits. Prosthetic cardiac valve is seen. There are no signs of pulmonary edema or focal pulmonary consolidation. There is no pleural effusion or pneumothorax. Degenerative changes are noted in both shoulders. IMPRESSION: No active disease. Electronically Signed   By: Elmer Picker M.D.   On: 10/15/2021 17:11  DG Chest 2 View  Result Date: 10/14/2021 CLINICAL DATA:  Status post mitral valve repair.  Chest pain. EXAM: CHEST - 2 VIEW COMPARISON:  Chest radiograph 07/07/2021 FINDINGS: Median sternotomy wires and a mitral valve prosthesis are stable. The cardiomediastinal silhouette is within normal limits There is no focal consolidation or pulmonary edema. There is no pleural effusion or pneumothorax. Compared to the study from 07/07/2021, previously seen airspace opacity and pleural effusions have resolved. There is no acute osseous abnormality. IMPRESSION: No radiographic evidence of acute cardiopulmonary process. Electronically Signed   By: Valetta Mole M.D.   On: 10/14/2021 15:15    Procedures Procedures    Medications Ordered in ED Medications  nitrofurantoin (macrocrystal-monohydrate) (MACROBID) capsule 100 mg (has no administration in time range)  sodium chloride 0.9 % bolus 500 mL (0 mLs Intravenous Stopped 10/15/21 1841)  sodium chloride 0.9 % bolus 500 mL (0 mLs Intravenous Stopped 10/15/21 2123)    ED Course/ Medical Decision Making/ A&P Clinical Course as of 10/15/21 2351  Wed Oct 15, 2021  1537 Stable 44 YOF with a complex medical history. Hypotensive when PT showed up today. Grossly asymptomatic except 1 episode of NBNB emesis.  Sepsis workup vs hypovolemia. Reassess [CC]  2047 Urinalysis, Routine w reflex microscopic Urine, Clean Catch [JR]    Clinical Course User Index [CC] Tretha Sciara, MD [JR] Harriet Pho, PA-C                           Medical Decision Making Amount and/or Complexity of Data Reviewed Labs: ordered. Decision-making details documented in ED Course. Radiology: ordered.   This patient presents to the ED for concern of hypotension, this involves a number of treatment options, and is a complaint that carries with it a high risk of complications and morbidity.  The differential diagnosis includes hypovolemia, sepsis, pericardial effusion, arrhythmia, and ACS.   Co morbidities: Discussed in HPI     EMR reviewed including pt PMHx, past surgical history and past visits to ER.   See HPI for more details   Lab Tests:   I ordered and independently interpreted labs. Labs notable for elevated troponin, pyuria, elevated creatinine   Imaging Studies:  NAD. I personally reviewed all imaging studies and no acute abnormality found. I agree with radiology interpretation.    Cardiac Monitoring:  The patient was maintained on a cardiac monitor.  I personally viewed and interpreted the cardiac monitored which showed an underlying rhythm of: NSR EKG non-ischemic   Medicines ordered:  I ordered medication including normal saline bolus for fluid resuscitation.  Nitrofurantoin for UTI. Reevaluation of the patient after these medicines showed that the patient stayed the same I have reviewed the patients home medicines and have made adjustments as needed   Consults/Attending Physician   I discussed this case with my attending physician who cosigned this note including patient's presenting symptoms, physical exam, and planned diagnostics and interventions. Attending physician stated agreement with plan or made changes to plan which were implemented.   Reevaluation:  After the interventions noted above I re-evaluated patient and found that they have :improved    Problem List / ED Course: Patient presented for evaluation for hypotension. Patient denied all other  pertinent associated symptoms.  Evaluation did reveal concerns for UTI.  Patient did not endorse any associated symptoms. Given nitrofurantoin. With regards to hypertension, fluid resuscitated with normal saline bolus.  Upon reevaluation blood pressure did improve.  Blood pressure  also improved with repositioning and change of cuff size.  Continue to denies shortness of breath chest pain abdominal pain. Did consider infection but unlikely given no white count, no fever, normal heart rate.  Also considered pericardial effusion but unlikely given normal cardiac silhouette and no clinical evidence of tamponade.  Hypotension likely related to hypovolemia.  Patient did vomit today and also stated that she has not had much to drink in the last couple days.  Creatinine is elevated above baseline.  Advised patient to follow-up with PCP in recheck labs in a few days.  Patient does not endorse flank tenderness or any changes in urination including hematuria.   Dispostion:  After consideration of the diagnostic results and the patients response to treatment, I feel that the patient would benefit from treatment for UTI and follow-up with PCP regarding elevated creatinine.         Final Clinical Impression(s) / ED Diagnoses Final diagnoses:  Urinary tract infection without hematuria, site unspecified    Rx / DC Orders ED Discharge Orders          Ordered    nitrofurantoin, macrocrystal-monohydrate, (MACROBID) 100 MG capsule  2 times daily        10/15/21 2351              Harriet Pho, PA-C 10/15/21 2355    Tretha Sciara, MD 10/16/21 1515

## 2021-10-15 NOTE — ED Triage Notes (Signed)
Pt BIB [facility] due to hypotension.  Pt has been feeling weak with abdominal pain.  Pt has hx of stroke (right sided deficits) and mitral valve repair.  PT went home to evaluate pt when she found pts BP in the low 70's.  VS 95/71, SpO2 100%, CBG 141.

## 2021-10-15 NOTE — Telephone Encounter (Signed)
Spoke with daughter who reported patient's bp is very low: 65/49 80/70 70/62  Daughter stated 911 is on the way to patient's home.

## 2021-10-15 NOTE — ED Notes (Signed)
IV team at bedside 

## 2021-10-16 DIAGNOSIS — N390 Urinary tract infection, site not specified: Secondary | ICD-10-CM | POA: Diagnosis not present

## 2021-10-16 NOTE — ED Notes (Signed)
Mother was contacted for transportation upon discharge, advised will pick pt up

## 2021-10-16 NOTE — Telephone Encounter (Signed)
Patient has been seen in the ER 

## 2021-10-17 DIAGNOSIS — I69351 Hemiplegia and hemiparesis following cerebral infarction affecting right dominant side: Secondary | ICD-10-CM | POA: Diagnosis not present

## 2021-10-17 DIAGNOSIS — N179 Acute kidney failure, unspecified: Secondary | ICD-10-CM | POA: Diagnosis not present

## 2021-10-17 DIAGNOSIS — Z992 Dependence on renal dialysis: Secondary | ICD-10-CM | POA: Diagnosis not present

## 2021-10-17 DIAGNOSIS — D649 Anemia, unspecified: Secondary | ICD-10-CM | POA: Diagnosis not present

## 2021-10-17 DIAGNOSIS — K5900 Constipation, unspecified: Secondary | ICD-10-CM | POA: Diagnosis not present

## 2021-10-17 DIAGNOSIS — S82402D Unspecified fracture of shaft of left fibula, subsequent encounter for closed fracture with routine healing: Secondary | ICD-10-CM | POA: Diagnosis not present

## 2021-10-17 DIAGNOSIS — M179 Osteoarthritis of knee, unspecified: Secondary | ICD-10-CM | POA: Diagnosis not present

## 2021-10-17 DIAGNOSIS — I69391 Dysphagia following cerebral infarction: Secondary | ICD-10-CM | POA: Diagnosis not present

## 2021-10-17 DIAGNOSIS — I69320 Aphasia following cerebral infarction: Secondary | ICD-10-CM | POA: Diagnosis not present

## 2021-10-17 DIAGNOSIS — I4891 Unspecified atrial fibrillation: Secondary | ICD-10-CM | POA: Diagnosis not present

## 2021-10-17 DIAGNOSIS — Z9181 History of falling: Secondary | ICD-10-CM | POA: Diagnosis not present

## 2021-10-17 DIAGNOSIS — I69398 Other sequelae of cerebral infarction: Secondary | ICD-10-CM | POA: Diagnosis not present

## 2021-10-17 DIAGNOSIS — S81811D Laceration without foreign body, right lower leg, subsequent encounter: Secondary | ICD-10-CM | POA: Diagnosis not present

## 2021-10-17 DIAGNOSIS — E785 Hyperlipidemia, unspecified: Secondary | ICD-10-CM | POA: Diagnosis not present

## 2021-10-17 DIAGNOSIS — E876 Hypokalemia: Secondary | ICD-10-CM | POA: Diagnosis not present

## 2021-10-17 DIAGNOSIS — I10 Essential (primary) hypertension: Secondary | ICD-10-CM | POA: Diagnosis not present

## 2021-10-18 DIAGNOSIS — D649 Anemia, unspecified: Secondary | ICD-10-CM | POA: Diagnosis not present

## 2021-10-18 DIAGNOSIS — I69391 Dysphagia following cerebral infarction: Secondary | ICD-10-CM | POA: Diagnosis not present

## 2021-10-18 DIAGNOSIS — E785 Hyperlipidemia, unspecified: Secondary | ICD-10-CM | POA: Diagnosis not present

## 2021-10-18 DIAGNOSIS — I69320 Aphasia following cerebral infarction: Secondary | ICD-10-CM | POA: Diagnosis not present

## 2021-10-18 DIAGNOSIS — I10 Essential (primary) hypertension: Secondary | ICD-10-CM | POA: Diagnosis not present

## 2021-10-18 DIAGNOSIS — I69398 Other sequelae of cerebral infarction: Secondary | ICD-10-CM | POA: Diagnosis not present

## 2021-10-18 DIAGNOSIS — N179 Acute kidney failure, unspecified: Secondary | ICD-10-CM | POA: Diagnosis not present

## 2021-10-18 DIAGNOSIS — E876 Hypokalemia: Secondary | ICD-10-CM | POA: Diagnosis not present

## 2021-10-18 DIAGNOSIS — Z9181 History of falling: Secondary | ICD-10-CM | POA: Diagnosis not present

## 2021-10-18 DIAGNOSIS — I4891 Unspecified atrial fibrillation: Secondary | ICD-10-CM | POA: Diagnosis not present

## 2021-10-18 DIAGNOSIS — S81811D Laceration without foreign body, right lower leg, subsequent encounter: Secondary | ICD-10-CM | POA: Diagnosis not present

## 2021-10-18 DIAGNOSIS — S82402D Unspecified fracture of shaft of left fibula, subsequent encounter for closed fracture with routine healing: Secondary | ICD-10-CM | POA: Diagnosis not present

## 2021-10-18 DIAGNOSIS — K5900 Constipation, unspecified: Secondary | ICD-10-CM | POA: Diagnosis not present

## 2021-10-18 DIAGNOSIS — Z992 Dependence on renal dialysis: Secondary | ICD-10-CM | POA: Diagnosis not present

## 2021-10-18 DIAGNOSIS — M179 Osteoarthritis of knee, unspecified: Secondary | ICD-10-CM | POA: Diagnosis not present

## 2021-10-18 DIAGNOSIS — I69351 Hemiplegia and hemiparesis following cerebral infarction affecting right dominant side: Secondary | ICD-10-CM | POA: Diagnosis not present

## 2021-10-20 DIAGNOSIS — S81811D Laceration without foreign body, right lower leg, subsequent encounter: Secondary | ICD-10-CM | POA: Diagnosis not present

## 2021-10-20 DIAGNOSIS — I10 Essential (primary) hypertension: Secondary | ICD-10-CM | POA: Diagnosis not present

## 2021-10-20 DIAGNOSIS — Z992 Dependence on renal dialysis: Secondary | ICD-10-CM | POA: Diagnosis not present

## 2021-10-20 DIAGNOSIS — E876 Hypokalemia: Secondary | ICD-10-CM | POA: Diagnosis not present

## 2021-10-20 DIAGNOSIS — D649 Anemia, unspecified: Secondary | ICD-10-CM | POA: Diagnosis not present

## 2021-10-20 DIAGNOSIS — N179 Acute kidney failure, unspecified: Secondary | ICD-10-CM | POA: Diagnosis not present

## 2021-10-20 DIAGNOSIS — Z9181 History of falling: Secondary | ICD-10-CM | POA: Diagnosis not present

## 2021-10-20 DIAGNOSIS — S82402D Unspecified fracture of shaft of left fibula, subsequent encounter for closed fracture with routine healing: Secondary | ICD-10-CM | POA: Diagnosis not present

## 2021-10-20 DIAGNOSIS — I69351 Hemiplegia and hemiparesis following cerebral infarction affecting right dominant side: Secondary | ICD-10-CM | POA: Diagnosis not present

## 2021-10-20 DIAGNOSIS — K5900 Constipation, unspecified: Secondary | ICD-10-CM | POA: Diagnosis not present

## 2021-10-20 DIAGNOSIS — E785 Hyperlipidemia, unspecified: Secondary | ICD-10-CM | POA: Diagnosis not present

## 2021-10-20 DIAGNOSIS — I69391 Dysphagia following cerebral infarction: Secondary | ICD-10-CM | POA: Diagnosis not present

## 2021-10-20 DIAGNOSIS — M179 Osteoarthritis of knee, unspecified: Secondary | ICD-10-CM | POA: Diagnosis not present

## 2021-10-20 DIAGNOSIS — I69398 Other sequelae of cerebral infarction: Secondary | ICD-10-CM | POA: Diagnosis not present

## 2021-10-20 DIAGNOSIS — I4891 Unspecified atrial fibrillation: Secondary | ICD-10-CM | POA: Diagnosis not present

## 2021-10-20 DIAGNOSIS — I69320 Aphasia following cerebral infarction: Secondary | ICD-10-CM | POA: Diagnosis not present

## 2021-10-20 LAB — CULTURE, BLOOD (ROUTINE X 2)
Culture: NO GROWTH
Special Requests: ADEQUATE

## 2021-10-21 DIAGNOSIS — I4891 Unspecified atrial fibrillation: Secondary | ICD-10-CM | POA: Diagnosis not present

## 2021-10-21 DIAGNOSIS — I69320 Aphasia following cerebral infarction: Secondary | ICD-10-CM | POA: Diagnosis not present

## 2021-10-21 DIAGNOSIS — I69391 Dysphagia following cerebral infarction: Secondary | ICD-10-CM | POA: Diagnosis not present

## 2021-10-21 DIAGNOSIS — K5900 Constipation, unspecified: Secondary | ICD-10-CM | POA: Diagnosis not present

## 2021-10-21 DIAGNOSIS — Z992 Dependence on renal dialysis: Secondary | ICD-10-CM | POA: Diagnosis not present

## 2021-10-21 DIAGNOSIS — I69351 Hemiplegia and hemiparesis following cerebral infarction affecting right dominant side: Secondary | ICD-10-CM | POA: Diagnosis not present

## 2021-10-21 DIAGNOSIS — E785 Hyperlipidemia, unspecified: Secondary | ICD-10-CM | POA: Diagnosis not present

## 2021-10-21 DIAGNOSIS — S81811D Laceration without foreign body, right lower leg, subsequent encounter: Secondary | ICD-10-CM | POA: Diagnosis not present

## 2021-10-21 DIAGNOSIS — S82402D Unspecified fracture of shaft of left fibula, subsequent encounter for closed fracture with routine healing: Secondary | ICD-10-CM | POA: Diagnosis not present

## 2021-10-21 DIAGNOSIS — N179 Acute kidney failure, unspecified: Secondary | ICD-10-CM | POA: Diagnosis not present

## 2021-10-21 DIAGNOSIS — E876 Hypokalemia: Secondary | ICD-10-CM | POA: Diagnosis not present

## 2021-10-21 DIAGNOSIS — M179 Osteoarthritis of knee, unspecified: Secondary | ICD-10-CM | POA: Diagnosis not present

## 2021-10-21 DIAGNOSIS — I10 Essential (primary) hypertension: Secondary | ICD-10-CM | POA: Diagnosis not present

## 2021-10-21 DIAGNOSIS — Z9181 History of falling: Secondary | ICD-10-CM | POA: Diagnosis not present

## 2021-10-21 DIAGNOSIS — I69398 Other sequelae of cerebral infarction: Secondary | ICD-10-CM | POA: Diagnosis not present

## 2021-10-21 DIAGNOSIS — D649 Anemia, unspecified: Secondary | ICD-10-CM | POA: Diagnosis not present

## 2021-10-22 DIAGNOSIS — I10 Essential (primary) hypertension: Secondary | ICD-10-CM | POA: Diagnosis not present

## 2021-10-22 DIAGNOSIS — I69391 Dysphagia following cerebral infarction: Secondary | ICD-10-CM | POA: Diagnosis not present

## 2021-10-22 DIAGNOSIS — D649 Anemia, unspecified: Secondary | ICD-10-CM | POA: Diagnosis not present

## 2021-10-22 DIAGNOSIS — E785 Hyperlipidemia, unspecified: Secondary | ICD-10-CM | POA: Diagnosis not present

## 2021-10-22 DIAGNOSIS — S81811D Laceration without foreign body, right lower leg, subsequent encounter: Secondary | ICD-10-CM | POA: Diagnosis not present

## 2021-10-22 DIAGNOSIS — N179 Acute kidney failure, unspecified: Secondary | ICD-10-CM | POA: Diagnosis not present

## 2021-10-22 DIAGNOSIS — I69398 Other sequelae of cerebral infarction: Secondary | ICD-10-CM | POA: Diagnosis not present

## 2021-10-22 DIAGNOSIS — M179 Osteoarthritis of knee, unspecified: Secondary | ICD-10-CM | POA: Diagnosis not present

## 2021-10-22 DIAGNOSIS — Z9181 History of falling: Secondary | ICD-10-CM | POA: Diagnosis not present

## 2021-10-22 DIAGNOSIS — E876 Hypokalemia: Secondary | ICD-10-CM | POA: Diagnosis not present

## 2021-10-22 DIAGNOSIS — I4891 Unspecified atrial fibrillation: Secondary | ICD-10-CM | POA: Diagnosis not present

## 2021-10-22 DIAGNOSIS — I69320 Aphasia following cerebral infarction: Secondary | ICD-10-CM | POA: Diagnosis not present

## 2021-10-22 DIAGNOSIS — S81801A Unspecified open wound, right lower leg, initial encounter: Secondary | ICD-10-CM | POA: Diagnosis not present

## 2021-10-22 DIAGNOSIS — S82402D Unspecified fracture of shaft of left fibula, subsequent encounter for closed fracture with routine healing: Secondary | ICD-10-CM | POA: Diagnosis not present

## 2021-10-22 DIAGNOSIS — I69351 Hemiplegia and hemiparesis following cerebral infarction affecting right dominant side: Secondary | ICD-10-CM | POA: Diagnosis not present

## 2021-10-22 DIAGNOSIS — K5900 Constipation, unspecified: Secondary | ICD-10-CM | POA: Diagnosis not present

## 2021-10-22 DIAGNOSIS — Z992 Dependence on renal dialysis: Secondary | ICD-10-CM | POA: Diagnosis not present

## 2021-10-23 DIAGNOSIS — D649 Anemia, unspecified: Secondary | ICD-10-CM | POA: Diagnosis not present

## 2021-10-23 DIAGNOSIS — I69351 Hemiplegia and hemiparesis following cerebral infarction affecting right dominant side: Secondary | ICD-10-CM | POA: Diagnosis not present

## 2021-10-23 DIAGNOSIS — K5900 Constipation, unspecified: Secondary | ICD-10-CM | POA: Diagnosis not present

## 2021-10-23 DIAGNOSIS — M179 Osteoarthritis of knee, unspecified: Secondary | ICD-10-CM | POA: Diagnosis not present

## 2021-10-23 DIAGNOSIS — Z992 Dependence on renal dialysis: Secondary | ICD-10-CM | POA: Diagnosis not present

## 2021-10-23 DIAGNOSIS — N179 Acute kidney failure, unspecified: Secondary | ICD-10-CM | POA: Diagnosis not present

## 2021-10-23 DIAGNOSIS — I69320 Aphasia following cerebral infarction: Secondary | ICD-10-CM | POA: Diagnosis not present

## 2021-10-23 DIAGNOSIS — I10 Essential (primary) hypertension: Secondary | ICD-10-CM | POA: Diagnosis not present

## 2021-10-23 DIAGNOSIS — E785 Hyperlipidemia, unspecified: Secondary | ICD-10-CM | POA: Diagnosis not present

## 2021-10-23 DIAGNOSIS — I4891 Unspecified atrial fibrillation: Secondary | ICD-10-CM | POA: Diagnosis not present

## 2021-10-23 DIAGNOSIS — S81811D Laceration without foreign body, right lower leg, subsequent encounter: Secondary | ICD-10-CM | POA: Diagnosis not present

## 2021-10-23 DIAGNOSIS — S82402D Unspecified fracture of shaft of left fibula, subsequent encounter for closed fracture with routine healing: Secondary | ICD-10-CM | POA: Diagnosis not present

## 2021-10-23 DIAGNOSIS — I69391 Dysphagia following cerebral infarction: Secondary | ICD-10-CM | POA: Diagnosis not present

## 2021-10-23 DIAGNOSIS — Z9181 History of falling: Secondary | ICD-10-CM | POA: Diagnosis not present

## 2021-10-23 DIAGNOSIS — E876 Hypokalemia: Secondary | ICD-10-CM | POA: Diagnosis not present

## 2021-10-23 DIAGNOSIS — I69398 Other sequelae of cerebral infarction: Secondary | ICD-10-CM | POA: Diagnosis not present

## 2021-10-24 DIAGNOSIS — I69351 Hemiplegia and hemiparesis following cerebral infarction affecting right dominant side: Secondary | ICD-10-CM | POA: Diagnosis not present

## 2021-10-24 DIAGNOSIS — Z9181 History of falling: Secondary | ICD-10-CM | POA: Diagnosis not present

## 2021-10-24 DIAGNOSIS — S81811D Laceration without foreign body, right lower leg, subsequent encounter: Secondary | ICD-10-CM | POA: Diagnosis not present

## 2021-10-24 DIAGNOSIS — N179 Acute kidney failure, unspecified: Secondary | ICD-10-CM | POA: Diagnosis not present

## 2021-10-24 DIAGNOSIS — I10 Essential (primary) hypertension: Secondary | ICD-10-CM | POA: Diagnosis not present

## 2021-10-24 DIAGNOSIS — I4891 Unspecified atrial fibrillation: Secondary | ICD-10-CM | POA: Diagnosis not present

## 2021-10-24 DIAGNOSIS — I69391 Dysphagia following cerebral infarction: Secondary | ICD-10-CM | POA: Diagnosis not present

## 2021-10-24 DIAGNOSIS — I69398 Other sequelae of cerebral infarction: Secondary | ICD-10-CM | POA: Diagnosis not present

## 2021-10-24 DIAGNOSIS — S82402D Unspecified fracture of shaft of left fibula, subsequent encounter for closed fracture with routine healing: Secondary | ICD-10-CM | POA: Diagnosis not present

## 2021-10-24 DIAGNOSIS — E785 Hyperlipidemia, unspecified: Secondary | ICD-10-CM | POA: Diagnosis not present

## 2021-10-24 DIAGNOSIS — I69320 Aphasia following cerebral infarction: Secondary | ICD-10-CM | POA: Diagnosis not present

## 2021-10-24 DIAGNOSIS — M179 Osteoarthritis of knee, unspecified: Secondary | ICD-10-CM | POA: Diagnosis not present

## 2021-10-24 DIAGNOSIS — E876 Hypokalemia: Secondary | ICD-10-CM | POA: Diagnosis not present

## 2021-10-24 DIAGNOSIS — D649 Anemia, unspecified: Secondary | ICD-10-CM | POA: Diagnosis not present

## 2021-10-24 DIAGNOSIS — K5900 Constipation, unspecified: Secondary | ICD-10-CM | POA: Diagnosis not present

## 2021-10-24 DIAGNOSIS — Z992 Dependence on renal dialysis: Secondary | ICD-10-CM | POA: Diagnosis not present

## 2021-10-27 DIAGNOSIS — S81811D Laceration without foreign body, right lower leg, subsequent encounter: Secondary | ICD-10-CM | POA: Diagnosis not present

## 2021-10-27 DIAGNOSIS — M179 Osteoarthritis of knee, unspecified: Secondary | ICD-10-CM | POA: Diagnosis not present

## 2021-10-27 DIAGNOSIS — I69391 Dysphagia following cerebral infarction: Secondary | ICD-10-CM | POA: Diagnosis not present

## 2021-10-27 DIAGNOSIS — E785 Hyperlipidemia, unspecified: Secondary | ICD-10-CM | POA: Diagnosis not present

## 2021-10-27 DIAGNOSIS — E876 Hypokalemia: Secondary | ICD-10-CM | POA: Diagnosis not present

## 2021-10-27 DIAGNOSIS — D649 Anemia, unspecified: Secondary | ICD-10-CM | POA: Diagnosis not present

## 2021-10-27 DIAGNOSIS — I69320 Aphasia following cerebral infarction: Secondary | ICD-10-CM | POA: Diagnosis not present

## 2021-10-27 DIAGNOSIS — S82402D Unspecified fracture of shaft of left fibula, subsequent encounter for closed fracture with routine healing: Secondary | ICD-10-CM | POA: Diagnosis not present

## 2021-10-27 DIAGNOSIS — Z992 Dependence on renal dialysis: Secondary | ICD-10-CM | POA: Diagnosis not present

## 2021-10-27 DIAGNOSIS — K5900 Constipation, unspecified: Secondary | ICD-10-CM | POA: Diagnosis not present

## 2021-10-27 DIAGNOSIS — I69398 Other sequelae of cerebral infarction: Secondary | ICD-10-CM | POA: Diagnosis not present

## 2021-10-27 DIAGNOSIS — Z9181 History of falling: Secondary | ICD-10-CM | POA: Diagnosis not present

## 2021-10-27 DIAGNOSIS — I69351 Hemiplegia and hemiparesis following cerebral infarction affecting right dominant side: Secondary | ICD-10-CM | POA: Diagnosis not present

## 2021-10-27 DIAGNOSIS — N179 Acute kidney failure, unspecified: Secondary | ICD-10-CM | POA: Diagnosis not present

## 2021-10-27 DIAGNOSIS — I4891 Unspecified atrial fibrillation: Secondary | ICD-10-CM | POA: Diagnosis not present

## 2021-10-27 DIAGNOSIS — I10 Essential (primary) hypertension: Secondary | ICD-10-CM | POA: Diagnosis not present

## 2021-10-28 DIAGNOSIS — K5900 Constipation, unspecified: Secondary | ICD-10-CM | POA: Diagnosis not present

## 2021-10-28 DIAGNOSIS — I6990 Unspecified sequelae of unspecified cerebrovascular disease: Secondary | ICD-10-CM | POA: Diagnosis not present

## 2021-10-28 DIAGNOSIS — Z23 Encounter for immunization: Secondary | ICD-10-CM | POA: Diagnosis not present

## 2021-10-28 DIAGNOSIS — E785 Hyperlipidemia, unspecified: Secondary | ICD-10-CM | POA: Diagnosis not present

## 2021-10-28 DIAGNOSIS — I69320 Aphasia following cerebral infarction: Secondary | ICD-10-CM | POA: Diagnosis not present

## 2021-10-28 DIAGNOSIS — D649 Anemia, unspecified: Secondary | ICD-10-CM | POA: Diagnosis not present

## 2021-10-28 DIAGNOSIS — M2550 Pain in unspecified joint: Secondary | ICD-10-CM | POA: Diagnosis not present

## 2021-10-28 DIAGNOSIS — Z9889 Other specified postprocedural states: Secondary | ICD-10-CM | POA: Diagnosis not present

## 2021-10-28 DIAGNOSIS — E876 Hypokalemia: Secondary | ICD-10-CM | POA: Diagnosis not present

## 2021-10-28 DIAGNOSIS — I69351 Hemiplegia and hemiparesis following cerebral infarction affecting right dominant side: Secondary | ICD-10-CM | POA: Diagnosis not present

## 2021-10-28 DIAGNOSIS — L89609 Pressure ulcer of unspecified heel, unspecified stage: Secondary | ICD-10-CM | POA: Diagnosis not present

## 2021-10-28 DIAGNOSIS — I69398 Other sequelae of cerebral infarction: Secondary | ICD-10-CM | POA: Diagnosis not present

## 2021-10-28 DIAGNOSIS — S82402D Unspecified fracture of shaft of left fibula, subsequent encounter for closed fracture with routine healing: Secondary | ICD-10-CM | POA: Diagnosis not present

## 2021-10-28 DIAGNOSIS — S81811D Laceration without foreign body, right lower leg, subsequent encounter: Secondary | ICD-10-CM | POA: Diagnosis not present

## 2021-10-28 DIAGNOSIS — Z9181 History of falling: Secondary | ICD-10-CM | POA: Diagnosis not present

## 2021-10-28 DIAGNOSIS — M179 Osteoarthritis of knee, unspecified: Secondary | ICD-10-CM | POA: Diagnosis not present

## 2021-10-28 DIAGNOSIS — Z992 Dependence on renal dialysis: Secondary | ICD-10-CM | POA: Diagnosis not present

## 2021-10-28 DIAGNOSIS — I10 Essential (primary) hypertension: Secondary | ICD-10-CM | POA: Diagnosis not present

## 2021-10-28 DIAGNOSIS — Z8673 Personal history of transient ischemic attack (TIA), and cerebral infarction without residual deficits: Secondary | ICD-10-CM | POA: Diagnosis not present

## 2021-10-28 DIAGNOSIS — I4891 Unspecified atrial fibrillation: Secondary | ICD-10-CM | POA: Diagnosis not present

## 2021-10-28 DIAGNOSIS — E7800 Pure hypercholesterolemia, unspecified: Secondary | ICD-10-CM | POA: Diagnosis not present

## 2021-10-28 DIAGNOSIS — N179 Acute kidney failure, unspecified: Secondary | ICD-10-CM | POA: Diagnosis not present

## 2021-10-28 DIAGNOSIS — I69391 Dysphagia following cerebral infarction: Secondary | ICD-10-CM | POA: Diagnosis not present

## 2021-10-28 DIAGNOSIS — R7303 Prediabetes: Secondary | ICD-10-CM | POA: Diagnosis not present

## 2021-10-29 DIAGNOSIS — I69398 Other sequelae of cerebral infarction: Secondary | ICD-10-CM | POA: Diagnosis not present

## 2021-10-29 DIAGNOSIS — S82402D Unspecified fracture of shaft of left fibula, subsequent encounter for closed fracture with routine healing: Secondary | ICD-10-CM | POA: Diagnosis not present

## 2021-10-29 DIAGNOSIS — N179 Acute kidney failure, unspecified: Secondary | ICD-10-CM | POA: Diagnosis not present

## 2021-10-29 DIAGNOSIS — S81811D Laceration without foreign body, right lower leg, subsequent encounter: Secondary | ICD-10-CM | POA: Diagnosis not present

## 2021-10-29 DIAGNOSIS — I4891 Unspecified atrial fibrillation: Secondary | ICD-10-CM | POA: Diagnosis not present

## 2021-10-29 DIAGNOSIS — I10 Essential (primary) hypertension: Secondary | ICD-10-CM | POA: Diagnosis not present

## 2021-10-29 DIAGNOSIS — Z9181 History of falling: Secondary | ICD-10-CM | POA: Diagnosis not present

## 2021-10-29 DIAGNOSIS — D649 Anemia, unspecified: Secondary | ICD-10-CM | POA: Diagnosis not present

## 2021-10-29 DIAGNOSIS — I69351 Hemiplegia and hemiparesis following cerebral infarction affecting right dominant side: Secondary | ICD-10-CM | POA: Diagnosis not present

## 2021-10-29 DIAGNOSIS — Z992 Dependence on renal dialysis: Secondary | ICD-10-CM | POA: Diagnosis not present

## 2021-10-29 DIAGNOSIS — I69320 Aphasia following cerebral infarction: Secondary | ICD-10-CM | POA: Diagnosis not present

## 2021-10-29 DIAGNOSIS — I69391 Dysphagia following cerebral infarction: Secondary | ICD-10-CM | POA: Diagnosis not present

## 2021-10-29 DIAGNOSIS — E785 Hyperlipidemia, unspecified: Secondary | ICD-10-CM | POA: Diagnosis not present

## 2021-10-29 DIAGNOSIS — M179 Osteoarthritis of knee, unspecified: Secondary | ICD-10-CM | POA: Diagnosis not present

## 2021-10-29 DIAGNOSIS — K5900 Constipation, unspecified: Secondary | ICD-10-CM | POA: Diagnosis not present

## 2021-10-29 DIAGNOSIS — E876 Hypokalemia: Secondary | ICD-10-CM | POA: Diagnosis not present

## 2021-10-30 DIAGNOSIS — Z992 Dependence on renal dialysis: Secondary | ICD-10-CM | POA: Diagnosis not present

## 2021-10-30 DIAGNOSIS — S81811D Laceration without foreign body, right lower leg, subsequent encounter: Secondary | ICD-10-CM | POA: Diagnosis not present

## 2021-10-30 DIAGNOSIS — Z9181 History of falling: Secondary | ICD-10-CM | POA: Diagnosis not present

## 2021-10-30 DIAGNOSIS — I10 Essential (primary) hypertension: Secondary | ICD-10-CM | POA: Diagnosis not present

## 2021-10-30 DIAGNOSIS — I69391 Dysphagia following cerebral infarction: Secondary | ICD-10-CM | POA: Diagnosis not present

## 2021-10-30 DIAGNOSIS — K5900 Constipation, unspecified: Secondary | ICD-10-CM | POA: Diagnosis not present

## 2021-10-30 DIAGNOSIS — M179 Osteoarthritis of knee, unspecified: Secondary | ICD-10-CM | POA: Diagnosis not present

## 2021-10-30 DIAGNOSIS — E785 Hyperlipidemia, unspecified: Secondary | ICD-10-CM | POA: Diagnosis not present

## 2021-10-30 DIAGNOSIS — D649 Anemia, unspecified: Secondary | ICD-10-CM | POA: Diagnosis not present

## 2021-10-30 DIAGNOSIS — I4891 Unspecified atrial fibrillation: Secondary | ICD-10-CM | POA: Diagnosis not present

## 2021-10-30 DIAGNOSIS — I69398 Other sequelae of cerebral infarction: Secondary | ICD-10-CM | POA: Diagnosis not present

## 2021-10-30 DIAGNOSIS — N179 Acute kidney failure, unspecified: Secondary | ICD-10-CM | POA: Diagnosis not present

## 2021-10-30 DIAGNOSIS — I69351 Hemiplegia and hemiparesis following cerebral infarction affecting right dominant side: Secondary | ICD-10-CM | POA: Diagnosis not present

## 2021-10-30 DIAGNOSIS — I69320 Aphasia following cerebral infarction: Secondary | ICD-10-CM | POA: Diagnosis not present

## 2021-10-30 DIAGNOSIS — S82402D Unspecified fracture of shaft of left fibula, subsequent encounter for closed fracture with routine healing: Secondary | ICD-10-CM | POA: Diagnosis not present

## 2021-10-30 DIAGNOSIS — E876 Hypokalemia: Secondary | ICD-10-CM | POA: Diagnosis not present

## 2021-11-03 DIAGNOSIS — I69391 Dysphagia following cerebral infarction: Secondary | ICD-10-CM | POA: Diagnosis not present

## 2021-11-03 DIAGNOSIS — D649 Anemia, unspecified: Secondary | ICD-10-CM | POA: Diagnosis not present

## 2021-11-03 DIAGNOSIS — Z992 Dependence on renal dialysis: Secondary | ICD-10-CM | POA: Diagnosis not present

## 2021-11-03 DIAGNOSIS — Z9181 History of falling: Secondary | ICD-10-CM | POA: Diagnosis not present

## 2021-11-03 DIAGNOSIS — I69351 Hemiplegia and hemiparesis following cerebral infarction affecting right dominant side: Secondary | ICD-10-CM | POA: Diagnosis not present

## 2021-11-03 DIAGNOSIS — N179 Acute kidney failure, unspecified: Secondary | ICD-10-CM | POA: Diagnosis not present

## 2021-11-03 DIAGNOSIS — K5900 Constipation, unspecified: Secondary | ICD-10-CM | POA: Diagnosis not present

## 2021-11-03 DIAGNOSIS — I4891 Unspecified atrial fibrillation: Secondary | ICD-10-CM | POA: Diagnosis not present

## 2021-11-03 DIAGNOSIS — S81811D Laceration without foreign body, right lower leg, subsequent encounter: Secondary | ICD-10-CM | POA: Diagnosis not present

## 2021-11-03 DIAGNOSIS — I10 Essential (primary) hypertension: Secondary | ICD-10-CM | POA: Diagnosis not present

## 2021-11-03 DIAGNOSIS — I69320 Aphasia following cerebral infarction: Secondary | ICD-10-CM | POA: Diagnosis not present

## 2021-11-03 DIAGNOSIS — I69398 Other sequelae of cerebral infarction: Secondary | ICD-10-CM | POA: Diagnosis not present

## 2021-11-03 DIAGNOSIS — E876 Hypokalemia: Secondary | ICD-10-CM | POA: Diagnosis not present

## 2021-11-03 DIAGNOSIS — M179 Osteoarthritis of knee, unspecified: Secondary | ICD-10-CM | POA: Diagnosis not present

## 2021-11-03 DIAGNOSIS — S82402D Unspecified fracture of shaft of left fibula, subsequent encounter for closed fracture with routine healing: Secondary | ICD-10-CM | POA: Diagnosis not present

## 2021-11-03 DIAGNOSIS — E785 Hyperlipidemia, unspecified: Secondary | ICD-10-CM | POA: Diagnosis not present

## 2021-11-03 NOTE — Progress Notes (Signed)
Cardiology Office Note:    Date:  11/05/2021   ID:  Debra Klein, DOB [birth year], MRN [id number]  PCP:  Blair Heys, MD   [facility] Providers Cardiologist:  Olga Millers, MD     Referring MD: Blair Heys, MD   Chief Complaint  Patient presents with   Follow-up    Mitral valve repair for     History of Present Illness:    Debra Klein is a 46 y.o. female with a hx of hypertension, hyperlipidemia, schizophrenia, MSSA mitral valve endocarditis s/p mitral valve repair, postoperative PAF treated with amiodarone, and acute stroke.  She had a prolonged hospital admission from 6/8 - 07/17/2021 with acute encephalopathy in the setting of not eating or drinking or taking medications for the previous 2 weeks.  She sustained multiple falls including left ankle fracture.  She deteriorated and required intubation.  Left knee septic arthritis by MRI on 06/21/2021 treated with antibiotic and left knee arthroscopy, I&D, and synovectomy.  She was found to have sepsis with bacteremia and mitral valve endocarditis.  Brain imaging also showed a left MCA stroke suspected due to septic emboli.  Neurology was consulted.  She ultimately underwent mitral valve repair with CT surgery.  She did have a brief episode of A-fib was started on amiodarone by CT surgery with a taper.  She was recommended for a CIR with residual right-sided hemiparesis and dysphagia.  She was discharged on amiodarone taper, aspirin, Lipitor, Lopressor 12.5 mg twice daily.  It does not appear that she presented for her 6-week post valve surgery echocardiogram.  She was seen in the ER 10/15/2021 with a UTI.  She complained of fatigue.  She was brought to the ER because when PT came to her house that she was hypotensive with SBP 73.  On arrival to the ER, her blood pressure was normal.  Patient was asymptomatic.  In her low blood pressure reading earlier in the day the family wanted the patient evaluated.  Of note,  patient was taken out of the SNF early by the family because they felt they could provide better assistance with ADLs.  She has been home, family states that she has not been eating or drinking very well and they are worried she is dehydrated.  Hydrated with IVF.  Apparently there was a prolonged medical decision making conversation with the family and the EDP.  Given the patient's multiple comorbidities, her current state warranted hospital observation.  However, the family feared that she would have to return to a nursing home and ultimately requested outpatient care and management.  Patient was observed in the ER and remained stable therefore she was discharged without admission.  Was treated for a UTI.  She presents today for follow-up.  She is here with her mom and nephew. She is only taking liptor, on no other cardiac medications. Amiodarone and BB have been discontinued. She was unaware of her Afib during hospitalization. PT still comes to house. She has not been taking ASA, but they will start. Saw CT surgery 10/14/21 and felt she was doing well. She has had no further falls. Last tues she actually walked down stairs, which was a huge improvement. Possibly some cognitive barriers to successful ambulation (she doesn't want to).  Encouraged continued ambulation and PT.   Past Medical History:  Diagnosis Date   Hyperlipemia    Hypertension    IDA (iron deficiency anemia)    Schizophrenia (HCC)     Past Surgical History:  Procedure Laterality  Date   KNEE ARTHROSCOPY Left 06/24/2021   Procedure: ARTHROSCOPY LEFT KNEE IRRIGRATION AND DEBRIEDMENT AND SYNOVECTOMY;  Surgeon: Kathryne Hitch, MD;  Location: [facility] OR;  Service: Orthopedics;  Laterality: Left;   MITRAL VALVE REPAIR N/A 06/27/2021   Procedure: MITRAL VALVE REPAIR USING SIMUFORM SEMI-RIGID ANNULOPLASTY RING;  Surgeon: Loreli Slot, MD;  Location: [facility] OR;  Service: Open Heart Surgery;  Laterality: N/A;   No previous  surgery     TEE WITHOUT CARDIOVERSION N/A 06/27/2021   Procedure: TRANSESOPHAGEAL ECHOCARDIOGRAM (TEE);  Surgeon: Loreli Slot, MD;  Location: [facility] OR;  Service: Open Heart Surgery;  Laterality: N/A;    Current Medications: Current Meds  Medication Sig   aspirin 81 MG chewable tablet Chew 1 tablet (81 mg total) by mouth daily.   atorvastatin (LIPITOR) 20 MG tablet Take 20 mg by mouth daily.   benztropine (COGENTIN) 1 MG tablet Take 1 mg by mouth at bedtime.   cloZAPine (CLOZARIL) 50 MG tablet Take 1 tablet (50 mg total) by mouth at bedtime.   fluPHENAZine (PROLIXIN) 1 MG tablet Take 1 tablet (1 mg total) by mouth at bedtime.   iron polysaccharides (NIFEREX) 150 MG capsule Take 150 mg by mouth 2 (two) times daily.   vitamin B-12 1000 MCG tablet Take 1 tablet (1,000 mcg total) by mouth daily.     Allergies:   Patient has no known allergies.   Social History   Socioeconomic History   Marital status: Single    Spouse name: Not on file   Number of children: Not on file   Years of education: Not on file   Highest education level: Not on file  Occupational History   Not on file  Tobacco Use   Smoking status: Never   Smokeless tobacco: Never  Substance and Sexual Activity   Alcohol use: Not Currently   Drug use: Not Currently   Sexual activity: Not Currently  Other Topics Concern   Not on file  Social History Narrative   Not on file   Social Determinants of Health   Financial Resource Strain: Not on file  Food Insecurity: Not on file  Transportation Needs: Not on file  Physical Activity: Not on file  Stress: Not on file  Social Connections: Not on file     Family History: The patient's family history is not on file.  ROS:   Please see the history of present illness.     All other systems reviewed and are negative.  EKGs/Labs/Other Studies Reviewed:    The following studies were reviewed today:  Echo post-valve echo pending  TEE 06/24/21:  1. There is a  large 1.4x1.3cm highly mobile mass visualized on the  posterior mitral valve leaflet consistent with a mitral valve vegetation.  There is an anechoic region surrounding the vegetation with color flow  visualized into the area concerning for  abscess (clip 35, 36). There is moderate, highly eccentric mitral  regurgitation with no flow reversal visualized in the pulmonary veins.  Findings consistent with mitral valve endocarditis with abscess formation.  . The mitral valve is abnormal. Moderate  mitral valve regurgitation.   2. Left ventricular ejection fraction, by estimation, is 60 to 65%. The  left ventricle has normal function.   3. Right ventricular systolic function is normal. The right ventricular  size is normal.   4. No left atrial/left atrial appendage thrombus was detected.   5. The aortic valve is tricuspid. Aortic valve regurgitation is not  visualized. No  aortic stenosis is present.   EKG:  EKG is not ordered today.    Recent Labs: 06/20/2021: TSH 0.856 07/10/2021: Magnesium 1.9 10/15/2021: ALT 9; B Natriuretic Peptide 57.9; BUN 18; Creatinine, Ser 1.53; Hemoglobin 10.5; Platelets 196; Potassium 4.2; Sodium 138  Recent Lipid Panel    Component Value Date/Time   CHOL 161 06/20/2021 0200   TRIG 157 (H) 06/23/2021 0620   HDL <10 (L) 06/20/2021 0200   CHOLHDL  UNABLE TO CALCULATE 06/20/2021 0200   VLDL UNABLE TO CALCULATE 06/20/2021 0200   LDLCALC UNABLE TO CALCULATE IF TRIGLYCERIDE OVER 400 mg/dL 44/01/270 5366   LDLDIRECT 27.7 06/21/2021 0256     Risk Assessment/Calculations:    CHA2DS2-VASc Score = 3   This indicates a 3.2% annual risk of stroke. The patient's score is based upon: CHF History: 0 HTN History: 0 Diabetes History: 0 Stroke History: 2 Vascular Disease History: 0 Age Score: 0 Gender Score: 1   Afib occurred in the post-op period. No further recurrence. No anticoagulation for now. Will continue to follow. No further falls.      Physical Exam:     VS:  BP 102/73   Pulse 73   Ht 5' 5.5" (1.664 m)   Wt 146 lb 3.2 oz (66.3 kg)   SpO2 100%   BMI 23.96 kg/m     Wt Readings from Last 3 Encounters:  11/05/21 146 lb 3.2 oz (66.3 kg)  10/15/21 160 lb 0.9 oz (72.6 kg)  10/14/21 160 lb (72.6 kg)     GEN:  Well nourished, well developed in no acute distress HEENT: Normal NECK: No JVD; No carotid bruits LYMPHATICS: No lymphadenopathy CARDIAC: RRR, no murmurs, rubs, gallops RESPIRATORY:  Clear to auscultation without rales, wheezing or rhonchi  ABDOMEN: Soft, non-tender, non-distended MUSCULOSKELETAL:  No edema; No deformity  SKIN: Warm and dry NEUROLOGIC:  Alert and oriented x 3 PSYCHIATRIC:  Normal affect   ASSESSMENT:    1. Mitral valve disorder   2. Acute bacterial endocarditis   3. S/P mitral valve repair   4. Paroxysmal atrial fibrillation (HCC)   5. Essential hypertension   6. Hypercholesterolemia    PLAN:    In order of problems listed above:  Mitral valve endocarditis   Doing well, no shortness of breath Will obtain post-valve echocardiogram   Post-op Afib Hx of stroke from septic emboli Afib occurred in the post-op period. No further recurrence. No anticoagulation for now. Will continue to follow. No further falls. No longer on amiodarone or BB   Hyperlipidemia Continue 20 mg lipitor   Hypotension with falls BP medications have been discontinued BP marginal   Social barriers Mom is taking care of her at home She is working on increasing oral intake Pt is cooperative with exam, but repeats questions. Unclear what her baseline was prior to prolonged hospitalization and stroke.   Follow up in 6 months.  Will obtain post-valve echo.   Medication Adjustments/Labs and Tests Ordered: Current medicines are reviewed at length with the patient today.  Concerns regarding medicines are outlined above.  No orders of the defined types were placed in this encounter.  No orders of the defined types were  placed in this encounter.   Patient Instructions  Medication Instructions:  Start Aspirin 81 mg ( Take 1 Tablet Daily). *If you need a refill on your cardiac medications before your next appointment, please call your pharmacy*   Lab Work: No Labs If you have labs (blood work) drawn today and your tests  are completely normal, you will receive your results only by: MyChart Message (if you have MyChart) OR A paper copy in the mail If you have any lab test that is abnormal or we need to change your treatment, we will call you to review the results.   Testing/Procedures: [address]. Your physician has requested that you have an echocardiogram. Echocardiography is a painless test that uses sound waves to create images of your heart. It provides your doctor with information about the size and shape of your heart and how well your heart's chambers and valves are working. This procedure takes approximately one hour. There are no restrictions for this procedure. Please do NOT wear cologne, perfume, aftershave, or lotions (deodorant is allowed). Please arrive 15 minutes prior to your appointment time.    Follow-Up: At [facility], you and your health needs are our priority.  As part of our continuing mission to provide you with exceptional heart care, we have created designated Provider Care Teams.  These Care Teams include your primary Cardiologist (physician) and Advanced Practice Providers (APPs -  Physician Assistants and Nurse Practitioners) who all work together to provide you with the care you need, when you need it.  We recommend signing up for the patient portal called "MyChart".  Sign up information is provided on this After Visit Summary.  MyChart is used to connect with patients for Virtual Visits (Telemedicine).  Patients are able to view lab/test results, encounter notes, upcoming appointments, etc.  Non-urgent messages can be sent to your provider as  well.   To learn more about what you can do with MyChart, go to [email or web address].    Your next appointment:   6 month(s)  The format for your next appointment:   In Person  Provider:   Olga Millers, MD     Other Instructions Call Mid January to Schedule Follow up Appointment. Please Call office Palpitations and Irregular Pulse.    Signed, Jaquilla Basilio, [place]  11/05/2021 9:49 AM    [facility]

## 2021-11-05 ENCOUNTER — Encounter: Payer: Self-pay | Admitting: Physician Assistant

## 2021-11-05 ENCOUNTER — Ambulatory Visit: Payer: Medicare Other | Attending: Physician Assistant | Admitting: Physician Assistant

## 2021-11-05 VITALS — BP 102/73 | HR 73 | Ht 65.5 in | Wt 146.2 lb

## 2021-11-05 DIAGNOSIS — I330 Acute and subacute infective endocarditis: Secondary | ICD-10-CM

## 2021-11-05 DIAGNOSIS — Z992 Dependence on renal dialysis: Secondary | ICD-10-CM | POA: Diagnosis not present

## 2021-11-05 DIAGNOSIS — I69391 Dysphagia following cerebral infarction: Secondary | ICD-10-CM | POA: Diagnosis not present

## 2021-11-05 DIAGNOSIS — N179 Acute kidney failure, unspecified: Secondary | ICD-10-CM | POA: Diagnosis not present

## 2021-11-05 DIAGNOSIS — M179 Osteoarthritis of knee, unspecified: Secondary | ICD-10-CM | POA: Diagnosis not present

## 2021-11-05 DIAGNOSIS — Z9181 History of falling: Secondary | ICD-10-CM | POA: Diagnosis not present

## 2021-11-05 DIAGNOSIS — E7800 Pure hypercholesterolemia, unspecified: Secondary | ICD-10-CM

## 2021-11-05 DIAGNOSIS — K5900 Constipation, unspecified: Secondary | ICD-10-CM | POA: Diagnosis not present

## 2021-11-05 DIAGNOSIS — E785 Hyperlipidemia, unspecified: Secondary | ICD-10-CM | POA: Diagnosis not present

## 2021-11-05 DIAGNOSIS — I10 Essential (primary) hypertension: Secondary | ICD-10-CM | POA: Diagnosis not present

## 2021-11-05 DIAGNOSIS — I480 Paroxysmal atrial fibrillation: Secondary | ICD-10-CM

## 2021-11-05 DIAGNOSIS — I69320 Aphasia following cerebral infarction: Secondary | ICD-10-CM | POA: Diagnosis not present

## 2021-11-05 DIAGNOSIS — I69351 Hemiplegia and hemiparesis following cerebral infarction affecting right dominant side: Secondary | ICD-10-CM | POA: Diagnosis not present

## 2021-11-05 DIAGNOSIS — I059 Rheumatic mitral valve disease, unspecified: Secondary | ICD-10-CM

## 2021-11-05 DIAGNOSIS — I69398 Other sequelae of cerebral infarction: Secondary | ICD-10-CM | POA: Diagnosis not present

## 2021-11-05 DIAGNOSIS — S81811D Laceration without foreign body, right lower leg, subsequent encounter: Secondary | ICD-10-CM | POA: Diagnosis not present

## 2021-11-05 DIAGNOSIS — I4891 Unspecified atrial fibrillation: Secondary | ICD-10-CM | POA: Diagnosis not present

## 2021-11-05 DIAGNOSIS — Z9889 Other specified postprocedural states: Secondary | ICD-10-CM | POA: Diagnosis not present

## 2021-11-05 DIAGNOSIS — D649 Anemia, unspecified: Secondary | ICD-10-CM | POA: Diagnosis not present

## 2021-11-05 DIAGNOSIS — E876 Hypokalemia: Secondary | ICD-10-CM | POA: Diagnosis not present

## 2021-11-05 DIAGNOSIS — S82402D Unspecified fracture of shaft of left fibula, subsequent encounter for closed fracture with routine healing: Secondary | ICD-10-CM | POA: Diagnosis not present

## 2021-11-05 NOTE — Patient Instructions (Signed)
Medication Instructions:  Start Aspirin 81 mg ( Take 1 Tablet Daily). *If you need a refill on your cardiac medications before your next appointment, please call your pharmacy*   Lab Work: No Labs If you have labs (blood work) drawn today and your tests are completely normal, you will receive your results only by: [place] (if you have MyChart) OR A paper copy in the mail If you have any lab test that is abnormal or we need to change your treatment, we will call you to review the results.   Testing/Procedures: [address], [address]. Your physician has requested that you have an echocardiogram. Echocardiography is a painless test that uses sound waves to create images of your heart. It provides your doctor with information about the size and shape of your heart and how well your heart's chambers and valves are working. This procedure takes approximately one hour. There are no restrictions for this procedure. Please do NOT wear cologne, perfume, aftershave, or lotions (deodorant is allowed). Please arrive 15 minutes prior to your appointment time.    Follow-Up: At [facility], you and your health needs are our priority.  As part of our continuing mission to provide you with exceptional heart care, we have created designated Provider Care Teams.  These Care Teams include your primary Cardiologist (physician) and Advanced Practice Providers (APPs -  Physician Assistants and Nurse Practitioners) who all work together to provide you with the care you need, when you need it.  We recommend signing up for the patient portal called "MyChart".  Sign up information is provided on this After Visit Summary.  MyChart is used to connect with patients for Virtual Visits (Telemedicine).  Patients are able to view lab/test results, encounter notes, upcoming appointments, etc.  Non-urgent messages can be sent to your provider as well.   To learn more about what you can do with  MyChart, go to [email or web address].    Your next appointment:   6 month(s)  The format for your next appointment:   In Person  Provider:   [name], MD     Other Instructions Call Mid January to Schedule Follow up Appointment. Please Call office Palpitations and Irregular Pulse.

## 2021-11-06 ENCOUNTER — Ambulatory Visit (INDEPENDENT_AMBULATORY_CARE_PROVIDER_SITE_OTHER): Payer: Medicare Other | Admitting: Internal Medicine

## 2021-11-06 ENCOUNTER — Encounter: Payer: Self-pay | Admitting: Internal Medicine

## 2021-11-06 ENCOUNTER — Other Ambulatory Visit: Payer: Self-pay

## 2021-11-06 VITALS — BP 90/60 | HR 74 | Resp 16 | Ht 65.5 in | Wt 146.2 lb

## 2021-11-06 DIAGNOSIS — M00062 Staphylococcal arthritis, left knee: Secondary | ICD-10-CM | POA: Diagnosis not present

## 2021-11-06 DIAGNOSIS — I330 Acute and subacute infective endocarditis: Secondary | ICD-10-CM | POA: Diagnosis not present

## 2021-11-06 NOTE — Progress Notes (Signed)
   Subjective:    Patient ID: Debra Klein, female    DOB: 03-Sep-1975, 46 y.o.   MRN: [id number]  HPI Debra Klein is here for hsfu She was hospitalized in June with MSSA bacteremia associated with left MCA infarct and septic left knee and MV endocarditis s/p mitral valve repair 06/27/21 by Dr. Roxan Hockey.  She completed prolonged IV antibiotics and continued on oral antibiotics but it is unclear for how long and with what as no records of the nursing home available.  She was taken out of the [facility] 9/30 by the mother who was not satisified with the care.  No fever, no chills.  Has followed up with Dr. Roxan Hockey. Here with her mother who is her primary care giver.    Review of Systems  Constitutional:  Negative for chills, fatigue and fever.  Gastrointestinal:  Negative for diarrhea.  Skin:  Negative for rash.       Objective:   Physical Exam Eyes:     General: No scleral icterus. Pulmonary:     Effort: Pulmonary effort is normal.  Neurological:     Mental Status: She is alert.   SH: no tobacco        Assessment & Plan:

## 2021-11-06 NOTE — Assessment & Plan Note (Signed)
This is resolved with no current concerns with no fever or chills.  She can follow up here as needed.

## 2021-11-06 NOTE — Assessment & Plan Note (Signed)
Left knee looks good with no warmth or swelling.  Resolved.   I have personally spent 40 minutes involved in face-to-face and non-face-to-face activities for this patient on the day of the visit. Professional time spent includes the following activities: Preparing to see the patient (review of tests), Obtaining and/or reviewing separately obtained history (admission/discharge record), Performing a medically appropriate examination and/or evaluation , Ordering medications/tests/procedures, referring and communicating with other health care professionals, Documenting clinical information in the EMR, Independently interpreting results (not separately reported), Communicating results to the patient/family/caregiver, Counseling and educating the patient/family/caregiver and Care coordination (not separately reported).

## 2021-11-07 DIAGNOSIS — I69320 Aphasia following cerebral infarction: Secondary | ICD-10-CM | POA: Diagnosis not present

## 2021-11-07 DIAGNOSIS — I69391 Dysphagia following cerebral infarction: Secondary | ICD-10-CM | POA: Diagnosis not present

## 2021-11-07 DIAGNOSIS — I69398 Other sequelae of cerebral infarction: Secondary | ICD-10-CM | POA: Diagnosis not present

## 2021-11-07 DIAGNOSIS — I69351 Hemiplegia and hemiparesis following cerebral infarction affecting right dominant side: Secondary | ICD-10-CM | POA: Diagnosis not present

## 2021-11-07 DIAGNOSIS — D649 Anemia, unspecified: Secondary | ICD-10-CM | POA: Diagnosis not present

## 2021-11-07 DIAGNOSIS — Z992 Dependence on renal dialysis: Secondary | ICD-10-CM | POA: Diagnosis not present

## 2021-11-07 DIAGNOSIS — I10 Essential (primary) hypertension: Secondary | ICD-10-CM | POA: Diagnosis not present

## 2021-11-07 DIAGNOSIS — E785 Hyperlipidemia, unspecified: Secondary | ICD-10-CM | POA: Diagnosis not present

## 2021-11-07 DIAGNOSIS — N179 Acute kidney failure, unspecified: Secondary | ICD-10-CM | POA: Diagnosis not present

## 2021-11-07 DIAGNOSIS — K5900 Constipation, unspecified: Secondary | ICD-10-CM | POA: Diagnosis not present

## 2021-11-07 DIAGNOSIS — M179 Osteoarthritis of knee, unspecified: Secondary | ICD-10-CM | POA: Diagnosis not present

## 2021-11-07 DIAGNOSIS — E876 Hypokalemia: Secondary | ICD-10-CM | POA: Diagnosis not present

## 2021-11-07 DIAGNOSIS — S81811D Laceration without foreign body, right lower leg, subsequent encounter: Secondary | ICD-10-CM | POA: Diagnosis not present

## 2021-11-07 DIAGNOSIS — S82402D Unspecified fracture of shaft of left fibula, subsequent encounter for closed fracture with routine healing: Secondary | ICD-10-CM | POA: Diagnosis not present

## 2021-11-07 DIAGNOSIS — Z9181 History of falling: Secondary | ICD-10-CM | POA: Diagnosis not present

## 2021-11-07 DIAGNOSIS — I4891 Unspecified atrial fibrillation: Secondary | ICD-10-CM | POA: Diagnosis not present

## 2021-11-08 DIAGNOSIS — R269 Unspecified abnormalities of gait and mobility: Secondary | ICD-10-CM | POA: Diagnosis not present

## 2021-11-08 DIAGNOSIS — M00062 Staphylococcal arthritis, left knee: Secondary | ICD-10-CM | POA: Diagnosis not present

## 2021-11-08 DIAGNOSIS — I63412 Cerebral infarction due to embolism of left middle cerebral artery: Secondary | ICD-10-CM | POA: Diagnosis not present

## 2021-11-10 DIAGNOSIS — Z9181 History of falling: Secondary | ICD-10-CM | POA: Diagnosis not present

## 2021-11-10 DIAGNOSIS — S81811D Laceration without foreign body, right lower leg, subsequent encounter: Secondary | ICD-10-CM | POA: Diagnosis not present

## 2021-11-10 DIAGNOSIS — N179 Acute kidney failure, unspecified: Secondary | ICD-10-CM | POA: Diagnosis not present

## 2021-11-10 DIAGNOSIS — E876 Hypokalemia: Secondary | ICD-10-CM | POA: Diagnosis not present

## 2021-11-10 DIAGNOSIS — I69391 Dysphagia following cerebral infarction: Secondary | ICD-10-CM | POA: Diagnosis not present

## 2021-11-10 DIAGNOSIS — I69351 Hemiplegia and hemiparesis following cerebral infarction affecting right dominant side: Secondary | ICD-10-CM | POA: Diagnosis not present

## 2021-11-10 DIAGNOSIS — S82402D Unspecified fracture of shaft of left fibula, subsequent encounter for closed fracture with routine healing: Secondary | ICD-10-CM | POA: Diagnosis not present

## 2021-11-10 DIAGNOSIS — Z992 Dependence on renal dialysis: Secondary | ICD-10-CM | POA: Diagnosis not present

## 2021-11-10 DIAGNOSIS — D649 Anemia, unspecified: Secondary | ICD-10-CM | POA: Diagnosis not present

## 2021-11-10 DIAGNOSIS — E785 Hyperlipidemia, unspecified: Secondary | ICD-10-CM | POA: Diagnosis not present

## 2021-11-10 DIAGNOSIS — M179 Osteoarthritis of knee, unspecified: Secondary | ICD-10-CM | POA: Diagnosis not present

## 2021-11-10 DIAGNOSIS — I4891 Unspecified atrial fibrillation: Secondary | ICD-10-CM | POA: Diagnosis not present

## 2021-11-10 DIAGNOSIS — I10 Essential (primary) hypertension: Secondary | ICD-10-CM | POA: Diagnosis not present

## 2021-11-10 DIAGNOSIS — I69320 Aphasia following cerebral infarction: Secondary | ICD-10-CM | POA: Diagnosis not present

## 2021-11-10 DIAGNOSIS — K5900 Constipation, unspecified: Secondary | ICD-10-CM | POA: Diagnosis not present

## 2021-11-10 DIAGNOSIS — I69398 Other sequelae of cerebral infarction: Secondary | ICD-10-CM | POA: Diagnosis not present

## 2021-11-11 DIAGNOSIS — I4891 Unspecified atrial fibrillation: Secondary | ICD-10-CM | POA: Diagnosis not present

## 2021-11-11 DIAGNOSIS — N179 Acute kidney failure, unspecified: Secondary | ICD-10-CM | POA: Diagnosis not present

## 2021-11-11 DIAGNOSIS — Z992 Dependence on renal dialysis: Secondary | ICD-10-CM | POA: Diagnosis not present

## 2021-11-11 DIAGNOSIS — E876 Hypokalemia: Secondary | ICD-10-CM | POA: Diagnosis not present

## 2021-11-11 DIAGNOSIS — M179 Osteoarthritis of knee, unspecified: Secondary | ICD-10-CM | POA: Diagnosis not present

## 2021-11-11 DIAGNOSIS — S82402D Unspecified fracture of shaft of left fibula, subsequent encounter for closed fracture with routine healing: Secondary | ICD-10-CM | POA: Diagnosis not present

## 2021-11-11 DIAGNOSIS — I69320 Aphasia following cerebral infarction: Secondary | ICD-10-CM | POA: Diagnosis not present

## 2021-11-11 DIAGNOSIS — Z9181 History of falling: Secondary | ICD-10-CM | POA: Diagnosis not present

## 2021-11-11 DIAGNOSIS — I10 Essential (primary) hypertension: Secondary | ICD-10-CM | POA: Diagnosis not present

## 2021-11-11 DIAGNOSIS — I69398 Other sequelae of cerebral infarction: Secondary | ICD-10-CM | POA: Diagnosis not present

## 2021-11-11 DIAGNOSIS — S81811D Laceration without foreign body, right lower leg, subsequent encounter: Secondary | ICD-10-CM | POA: Diagnosis not present

## 2021-11-11 DIAGNOSIS — K5900 Constipation, unspecified: Secondary | ICD-10-CM | POA: Diagnosis not present

## 2021-11-11 DIAGNOSIS — I69351 Hemiplegia and hemiparesis following cerebral infarction affecting right dominant side: Secondary | ICD-10-CM | POA: Diagnosis not present

## 2021-11-11 DIAGNOSIS — I69391 Dysphagia following cerebral infarction: Secondary | ICD-10-CM | POA: Diagnosis not present

## 2021-11-11 DIAGNOSIS — D649 Anemia, unspecified: Secondary | ICD-10-CM | POA: Diagnosis not present

## 2021-11-11 DIAGNOSIS — E785 Hyperlipidemia, unspecified: Secondary | ICD-10-CM | POA: Diagnosis not present

## 2021-11-13 DIAGNOSIS — S81801A Unspecified open wound, right lower leg, initial encounter: Secondary | ICD-10-CM | POA: Diagnosis not present

## 2021-11-14 DIAGNOSIS — S81811D Laceration without foreign body, right lower leg, subsequent encounter: Secondary | ICD-10-CM | POA: Diagnosis not present

## 2021-11-14 DIAGNOSIS — Z992 Dependence on renal dialysis: Secondary | ICD-10-CM | POA: Diagnosis not present

## 2021-11-14 DIAGNOSIS — I69351 Hemiplegia and hemiparesis following cerebral infarction affecting right dominant side: Secondary | ICD-10-CM | POA: Diagnosis not present

## 2021-11-14 DIAGNOSIS — S82402D Unspecified fracture of shaft of left fibula, subsequent encounter for closed fracture with routine healing: Secondary | ICD-10-CM | POA: Diagnosis not present

## 2021-11-14 DIAGNOSIS — N179 Acute kidney failure, unspecified: Secondary | ICD-10-CM | POA: Diagnosis not present

## 2021-11-14 DIAGNOSIS — I4891 Unspecified atrial fibrillation: Secondary | ICD-10-CM | POA: Diagnosis not present

## 2021-11-14 DIAGNOSIS — I69391 Dysphagia following cerebral infarction: Secondary | ICD-10-CM | POA: Diagnosis not present

## 2021-11-14 DIAGNOSIS — D649 Anemia, unspecified: Secondary | ICD-10-CM | POA: Diagnosis not present

## 2021-11-14 DIAGNOSIS — I69320 Aphasia following cerebral infarction: Secondary | ICD-10-CM | POA: Diagnosis not present

## 2021-11-14 DIAGNOSIS — K5900 Constipation, unspecified: Secondary | ICD-10-CM | POA: Diagnosis not present

## 2021-11-14 DIAGNOSIS — I10 Essential (primary) hypertension: Secondary | ICD-10-CM | POA: Diagnosis not present

## 2021-11-14 DIAGNOSIS — M179 Osteoarthritis of knee, unspecified: Secondary | ICD-10-CM | POA: Diagnosis not present

## 2021-11-14 DIAGNOSIS — I69398 Other sequelae of cerebral infarction: Secondary | ICD-10-CM | POA: Diagnosis not present

## 2021-11-14 DIAGNOSIS — Z9181 History of falling: Secondary | ICD-10-CM | POA: Diagnosis not present

## 2021-11-14 DIAGNOSIS — E876 Hypokalemia: Secondary | ICD-10-CM | POA: Diagnosis not present

## 2021-11-14 DIAGNOSIS — E785 Hyperlipidemia, unspecified: Secondary | ICD-10-CM | POA: Diagnosis not present

## 2021-11-18 ENCOUNTER — Emergency Department (HOSPITAL_COMMUNITY): Payer: Medicare Other

## 2021-11-18 ENCOUNTER — Inpatient Hospital Stay (HOSPITAL_COMMUNITY)
Admission: EM | Admit: 2021-11-18 | Discharge: 2021-11-21 | DRG: 689 | Disposition: A | Discharge status: Home / Self Care | Payer: Medicare Other | Attending: Internal Medicine | Admitting: Internal Medicine

## 2021-11-18 ENCOUNTER — Other Ambulatory Visit: Payer: Self-pay

## 2021-11-18 ENCOUNTER — Encounter (HOSPITAL_COMMUNITY): Payer: Self-pay | Admitting: Emergency Medicine

## 2021-11-18 DIAGNOSIS — R9431 Abnormal electrocardiogram [ECG] [EKG]: Secondary | ICD-10-CM | POA: Diagnosis present

## 2021-11-18 DIAGNOSIS — B965 Pseudomonas (aeruginosa) (mallei) (pseudomallei) as the cause of diseases classified elsewhere: Secondary | ICD-10-CM | POA: Diagnosis not present

## 2021-11-18 DIAGNOSIS — R0689 Other abnormalities of breathing: Secondary | ICD-10-CM | POA: Diagnosis not present

## 2021-11-18 DIAGNOSIS — Z8659 Personal history of other mental and behavioral disorders: Secondary | ICD-10-CM | POA: Diagnosis not present

## 2021-11-18 DIAGNOSIS — Z9181 History of falling: Secondary | ICD-10-CM | POA: Diagnosis not present

## 2021-11-18 DIAGNOSIS — K5900 Constipation, unspecified: Secondary | ICD-10-CM | POA: Diagnosis not present

## 2021-11-18 DIAGNOSIS — Z9889 Other specified postprocedural states: Secondary | ICD-10-CM

## 2021-11-18 DIAGNOSIS — N179 Acute kidney failure, unspecified: Secondary | ICD-10-CM | POA: Diagnosis not present

## 2021-11-18 DIAGNOSIS — H5703 Miosis: Secondary | ICD-10-CM

## 2021-11-18 DIAGNOSIS — G9340 Encephalopathy, unspecified: Secondary | ICD-10-CM | POA: Diagnosis present

## 2021-11-18 DIAGNOSIS — E785 Hyperlipidemia, unspecified: Secondary | ICD-10-CM | POA: Diagnosis not present

## 2021-11-18 DIAGNOSIS — G9341 Metabolic encephalopathy: Secondary | ICD-10-CM | POA: Diagnosis not present

## 2021-11-18 DIAGNOSIS — I69398 Other sequelae of cerebral infarction: Secondary | ICD-10-CM | POA: Diagnosis not present

## 2021-11-18 DIAGNOSIS — G9389 Other specified disorders of brain: Secondary | ICD-10-CM | POA: Diagnosis not present

## 2021-11-18 DIAGNOSIS — E782 Mixed hyperlipidemia: Secondary | ICD-10-CM | POA: Diagnosis not present

## 2021-11-18 DIAGNOSIS — I499 Cardiac arrhythmia, unspecified: Secondary | ICD-10-CM | POA: Diagnosis not present

## 2021-11-18 DIAGNOSIS — F209 Schizophrenia, unspecified: Secondary | ICD-10-CM | POA: Diagnosis present

## 2021-11-18 DIAGNOSIS — E860 Dehydration: Secondary | ICD-10-CM | POA: Diagnosis not present

## 2021-11-18 DIAGNOSIS — I69391 Dysphagia following cerebral infarction: Secondary | ICD-10-CM | POA: Diagnosis not present

## 2021-11-18 DIAGNOSIS — Z8679 Personal history of other diseases of the circulatory system: Secondary | ICD-10-CM | POA: Diagnosis not present

## 2021-11-18 DIAGNOSIS — Z7982 Long term (current) use of aspirin: Secondary | ICD-10-CM | POA: Diagnosis not present

## 2021-11-18 DIAGNOSIS — I69320 Aphasia following cerebral infarction: Secondary | ICD-10-CM

## 2021-11-18 DIAGNOSIS — R82998 Other abnormal findings in urine: Secondary | ICD-10-CM

## 2021-11-18 DIAGNOSIS — N3000 Acute cystitis without hematuria: Secondary | ICD-10-CM | POA: Diagnosis not present

## 2021-11-18 DIAGNOSIS — Z743 Need for continuous supervision: Secondary | ICD-10-CM | POA: Diagnosis not present

## 2021-11-18 DIAGNOSIS — I10 Essential (primary) hypertension: Secondary | ICD-10-CM | POA: Diagnosis present

## 2021-11-18 DIAGNOSIS — S82402D Unspecified fracture of shaft of left fibula, subsequent encounter for closed fracture with routine healing: Secondary | ICD-10-CM | POA: Diagnosis not present

## 2021-11-18 DIAGNOSIS — E872 Acidosis, unspecified: Secondary | ICD-10-CM | POA: Diagnosis not present

## 2021-11-18 DIAGNOSIS — E876 Hypokalemia: Secondary | ICD-10-CM | POA: Diagnosis present

## 2021-11-18 DIAGNOSIS — I619 Nontraumatic intracerebral hemorrhage, unspecified: Secondary | ICD-10-CM | POA: Diagnosis not present

## 2021-11-18 DIAGNOSIS — S81811D Laceration without foreign body, right lower leg, subsequent encounter: Secondary | ICD-10-CM | POA: Diagnosis not present

## 2021-11-18 DIAGNOSIS — Z8673 Personal history of transient ischemic attack (TIA), and cerebral infarction without residual deficits: Secondary | ICD-10-CM

## 2021-11-18 DIAGNOSIS — M179 Osteoarthritis of knee, unspecified: Secondary | ICD-10-CM | POA: Diagnosis not present

## 2021-11-18 DIAGNOSIS — Z79899 Other long term (current) drug therapy: Secondary | ICD-10-CM

## 2021-11-18 DIAGNOSIS — R404 Transient alteration of awareness: Secondary | ICD-10-CM | POA: Diagnosis not present

## 2021-11-18 DIAGNOSIS — R6889 Other general symptoms and signs: Secondary | ICD-10-CM | POA: Diagnosis not present

## 2021-11-18 DIAGNOSIS — I4891 Unspecified atrial fibrillation: Secondary | ICD-10-CM | POA: Diagnosis not present

## 2021-11-18 DIAGNOSIS — D509 Iron deficiency anemia, unspecified: Secondary | ICD-10-CM | POA: Diagnosis not present

## 2021-11-18 DIAGNOSIS — Z992 Dependence on renal dialysis: Secondary | ICD-10-CM | POA: Diagnosis not present

## 2021-11-18 DIAGNOSIS — D649 Anemia, unspecified: Secondary | ICD-10-CM | POA: Diagnosis not present

## 2021-11-18 DIAGNOSIS — R4182 Altered mental status, unspecified: Principal | ICD-10-CM

## 2021-11-18 DIAGNOSIS — I69351 Hemiplegia and hemiparesis following cerebral infarction affecting right dominant side: Secondary | ICD-10-CM | POA: Diagnosis not present

## 2021-11-18 HISTORY — DX: Cerebral infarction, unspecified: I63.9

## 2021-11-18 LAB — CBC
HCT: 28.8 % — ABNORMAL LOW (ref 36.0–46.0)
Hemoglobin: 8.9 g/dL — ABNORMAL LOW (ref 12.0–15.0)
MCH: 29.5 pg (ref 26.0–34.0)
MCHC: 30.9 g/dL (ref 30.0–36.0)
MCV: 95.4 fL (ref 80.0–100.0)
Platelets: 250 10*3/uL (ref 150–400)
RBC: 3.02 MIL/uL — ABNORMAL LOW (ref 3.87–5.11)
RDW: 19.4 % — ABNORMAL HIGH (ref 11.5–15.5)
WBC: 7.5 10*3/uL (ref 4.0–10.5)
nRBC: 0 % (ref 0.0–0.2)

## 2021-11-18 LAB — URINALYSIS, ROUTINE W REFLEX MICROSCOPIC
Bacteria, UA: NONE SEEN
Bilirubin Urine: NEGATIVE
Glucose, UA: NEGATIVE mg/dL
Hgb urine dipstick: NEGATIVE
Ketones, ur: NEGATIVE mg/dL
Nitrite: NEGATIVE
Protein, ur: NEGATIVE mg/dL
Specific Gravity, Urine: 1.019 (ref 1.005–1.030)
pH: 6 (ref 5.0–8.0)

## 2021-11-18 LAB — COMPREHENSIVE METABOLIC PANEL
ALT: 8 U/L (ref 0–44)
AST: 16 U/L (ref 15–41)
Albumin: 3.3 g/dL — ABNORMAL LOW (ref 3.5–5.0)
Alkaline Phosphatase: 42 U/L (ref 38–126)
Anion gap: 13 (ref 5–15)
BUN: 9 mg/dL (ref 6–20)
CO2: 22 mmol/L (ref 22–32)
Calcium: 9.4 mg/dL (ref 8.9–10.3)
Chloride: 107 mmol/L (ref 98–111)
Creatinine, Ser: 0.92 mg/dL (ref 0.44–1.00)
GFR, Estimated: >60 mL/min (ref >60)
Glucose, Bld: 141 mg/dL — ABNORMAL HIGH (ref 70–99)
Potassium: 3.3 mmol/L — ABNORMAL LOW (ref 3.5–5.1)
Sodium: 142 mmol/L (ref 135–145)
Total Bilirubin: 1.1 mg/dL (ref 0.3–1.2)
Total Protein: 6.3 g/dL — ABNORMAL LOW (ref 6.5–8.1)

## 2021-11-18 LAB — I-STAT BETA HCG BLOOD, ED (MC, WL, AP ONLY): I-stat hCG, quantitative: <5 m[IU]/mL (ref <5)

## 2021-11-18 LAB — I-STAT CHEM 8, ED
BUN: 8 mg/dL (ref 6–20)
Calcium, Ion: 1.2 mmol/L (ref 1.15–1.40)
Chloride: 106 mmol/L (ref 98–111)
Creatinine, Ser: 0.9 mg/dL (ref 0.44–1.00)
Glucose, Bld: 139 mg/dL — ABNORMAL HIGH (ref 70–99)
HCT: 28 % — ABNORMAL LOW (ref 36.0–46.0)
Hemoglobin: 9.5 g/dL — ABNORMAL LOW (ref 12.0–15.0)
Potassium: 3.3 mmol/L — ABNORMAL LOW (ref 3.5–5.1)
Sodium: 142 mmol/L (ref 135–145)
TCO2: 24 mmol/L (ref 22–32)

## 2021-11-18 LAB — LACTIC ACID, PLASMA: Lactic Acid, Venous: 2 mmol/L (ref 0.5–1.9)

## 2021-11-18 LAB — ETHANOL: Alcohol, Ethyl (B): <10 mg/dL

## 2021-11-18 LAB — RAPID URINE DRUG SCREEN, HOSP PERFORMED
Amphetamines: NOT DETECTED
Barbiturates: NOT DETECTED
Benzodiazepines: NOT DETECTED
Cocaine: NOT DETECTED
Opiates: NOT DETECTED
Tetrahydrocannabinol: NOT DETECTED

## 2021-11-18 LAB — ACETAMINOPHEN LEVEL: Acetaminophen (Tylenol), Serum: <10 ug/mL — ABNORMAL LOW (ref 10–30)

## 2021-11-18 LAB — MAGNESIUM: Magnesium: 1.6 mg/dL — ABNORMAL LOW (ref 1.7–2.4)

## 2021-11-18 LAB — SALICYLATE LEVEL: Salicylate Lvl: <7 mg/dL — ABNORMAL LOW (ref 7.0–30.0)

## 2021-11-18 LAB — CBG MONITORING, ED: Glucose-Capillary: 133 mg/dL — ABNORMAL HIGH (ref 70–99)

## 2021-11-18 MED ORDER — SODIUM CHLORIDE 0.9 % IV SOLN
1.0000 g | Freq: Once | INTRAVENOUS | Status: AC
  Administered 2021-11-18: 1 g via INTRAVENOUS
  Filled 2021-11-18: qty 10

## 2021-11-18 MED ORDER — SODIUM CHLORIDE 0.9 % IV BOLUS
1000.0000 mL | Freq: Once | INTRAVENOUS | Status: AC
  Administered 2021-11-18: 1000 mL via INTRAVENOUS

## 2021-11-18 MED ORDER — MAGNESIUM SULFATE IN D5W 1-5 GM/100ML-% IV SOLN
1.0000 g | Freq: Once | INTRAVENOUS | Status: AC
  Administered 2021-11-18: 1 g via INTRAVENOUS
  Filled 2021-11-18: qty 100

## 2021-11-18 MED ORDER — POTASSIUM CHLORIDE 10 MEQ/100ML IV SOLN
10.0000 meq | INTRAVENOUS | Status: AC
  Administered 2021-11-18 (×2): 10 meq via INTRAVENOUS
  Filled 2021-11-18 (×2): qty 100

## 2021-11-18 MED ORDER — NALOXONE HCL 2 MG/2ML IJ SOSY
1.0000 mg | PREFILLED_SYRINGE | Freq: Once | INTRAMUSCULAR | Status: AC
  Administered 2021-11-18: 1 mg via INTRAVENOUS
  Filled 2021-11-18: qty 2

## 2021-11-18 NOTE — ED Triage Notes (Signed)
Patient from home, she laid down on couch to take a nap, now she is alert to noxious stimuli, moaning.  No drugs or alcohol.  She has had a stroke last year.  Family was unable to wake her up and stated that this was like her previous stroke.  Pupils are constricted.  0.5mg  of narcan was given by EMS but didn't change her mental status.  She is hypertensive at 162/90.  Family en route to ED.

## 2021-11-18 NOTE — ED Provider Notes (Addendum)
[facility] EMERGENCY DEPARTMENT Provider Note   CSN: [id number] Arrival date & time: 11/18/21  2019     History  Chief Complaint  Patient presents with   Altered Mental Status    Debra Klein is a 46 y.o. female.  Pt presents via EMS with altered mental status. Pt limited historian - level 5 caveat. EMS indicates family said pt was lying down on cough for nap, but then they went to wake up tonight and was persistently unresponsive.  Family members indicates seemed at baseline prior to 5 pm, without specific physical c/o earlier today. Hx prior cva, ?related to prior endocarditis, family notes mild residual weakness and dysarthria/aphasia. No recent trauma or fall. No c/o of headache. No recent fevers. No unusual stressors. Family indicates good about taking normal meds - no hx med abuse or substance use disorder.  EMS notes pupils miotic, gave narcan with no change.   The history is provided by the patient, medical records, a relative and the EMS personnel. The history is limited by the condition of the patient.  Altered Mental Status      Home Medications Prior to Admission medications   Medication Sig Start Date End Date Taking? Authorizing Provider  aspirin 81 MG chewable tablet Chew 1 tablet (81 mg total) by mouth daily. 07/18/21   Burnadette Pop, MD  atorvastatin (LIPITOR) 20 MG tablet Take 20 mg by mouth daily. 06/16/20   [provider]  benztropine (COGENTIN) 1 MG tablet Take 1 mg by mouth at bedtime. 05/08/20   [provider]  cloZAPine (CLOZARIL) 50 MG tablet Take 1 tablet (50 mg total) by mouth at bedtime. 07/17/21   Burnadette Pop, MD  fluPHENAZine (PROLIXIN) 1 MG tablet Take 1 tablet (1 mg total) by mouth at bedtime. 07/17/21   Burnadette Pop, MD  iron polysaccharides (NIFEREX) 150 MG capsule Take 150 mg by mouth 2 (two) times daily. 05/30/20   [provider]  vitamin B-12 1000 MCG tablet Take 1 tablet (1,000 mcg total) by  mouth daily. Patient not taking: Reported on 11/06/2021 07/18/21   Burnadette Pop, MD      Allergies    Patient has no known allergies.    Review of Systems   Review of Systems  Unable to perform ROS: Patient unresponsive    Physical Exam Updated Vital Signs BP (!) 145/88 (BP Location: Right Arm)   Pulse 84   Temp (!) 97.3 F (36.3 C) (Oral)   Resp 16   LMP  (LMP Unknown)   SpO2 100%  Physical Exam Vitals and nursing note reviewed.  Constitutional:      Appearance: Normal appearance. She is well-developed.  HENT:     Head: Atraumatic.     Nose: Nose normal.     Mouth/Throat:     Mouth: Mucous membranes are moist.     Pharynx: Oropharynx is clear.  Eyes:     General: No scleral icterus.    Conjunctiva/sclera: Conjunctivae normal.     Comments: Pupils equal, 2 mm.   Neck:     Vascular: No carotid bruit.     Trachea: No tracheal deviation.     Comments: No stiffness or rigidity. Trachea midline. No bruits.  Cardiovascular:     Rate and Rhythm: Normal rate and regular rhythm.     Pulses: Normal pulses.     Heart sounds: Normal heart sounds. No murmur heard.    No friction rub. No gallop.  Pulmonary:     Effort:  Pulmonary effort is normal. No respiratory distress.     Breath sounds: Normal breath sounds.  Abdominal:     General: Bowel sounds are normal. There is no distension.     Palpations: Abdomen is soft.     Tenderness: There is no abdominal tenderness.  Genitourinary:    Comments: No cva tenderness.  Musculoskeletal:        General: No swelling or tenderness.     Cervical back: Normal range of motion and neck supple. No rigidity. No muscular tenderness.     Comments: No focal joint swelling, tenderness or erythema. CTLS spine, non tender, aligned, no step off.   Skin:    General: Skin is warm and dry.     Findings: No rash.  Neurological:     Mental Status: She is alert.     Comments: Pt with eyes closed. With stimuli, opens eyes, moves purposefully,  vocalizes but not clear what pt is saying.  Psychiatric:     Comments: Altered.      ED Results / Procedures / Treatments   Labs (all labs ordered are listed, but only abnormal results are displayed) Results for orders placed or performed during the hospital encounter of 11/18/21  CBC  Result Value Ref Range   WBC 7.5 4.0 - 10.5 K/uL   RBC 3.02 (L) 3.87 - 5.11 MIL/uL   Hemoglobin 8.9 (L) 12.0 - 15.0 g/dL   HCT 16.1 (L) 09.6 - 04.5 %   MCV 95.4 80.0 - 100.0 fL   MCH 29.5 26.0 - 34.0 pg   MCHC 30.9 30.0 - 36.0 g/dL   RDW 40.9 (H) 81.1 - 91.4 %   Platelets 250 150 - 400 K/uL   nRBC 0.0 0.0 - 0.2 %  Comprehensive metabolic panel  Result Value Ref Range   Sodium 142 135 - 145 mmol/L   Potassium 3.3 (L) 3.5 - 5.1 mmol/L   Chloride 107 98 - 111 mmol/L   CO2 22 22 - 32 mmol/L   Glucose, Bld 141 (H) 70 - 99 mg/dL   BUN 9 6 - 20 mg/dL   Creatinine, Ser 7.82 0.44 - 1.00 mg/dL   Calcium 9.4 8.9 - 95.6 mg/dL   Total Protein 6.3 (L) 6.5 - 8.1 g/dL   Albumin 3.3 (L) 3.5 - 5.0 g/dL   AST 16 15 - 41 U/L   ALT 8 0 - 44 U/L   Alkaline Phosphatase 42 38 - 126 U/L   Total Bilirubin 1.1 0.3 - 1.2 mg/dL   GFR, Estimated >21 >30 mL/min   Anion gap 13 5 - 15  Urinalysis, Routine w reflex microscopic Urine, In & Out Cath  Result Value Ref Range   Color, Urine YELLOW YELLOW   APPearance HAZY (A) CLEAR   Specific Gravity, Urine 1.019 1.005 - 1.030   pH 6.0 5.0 - 8.0   Glucose, UA NEGATIVE NEGATIVE mg/dL   Hgb urine dipstick NEGATIVE NEGATIVE   Bilirubin Urine NEGATIVE NEGATIVE   Ketones, ur NEGATIVE NEGATIVE mg/dL   Protein, ur NEGATIVE NEGATIVE mg/dL   Nitrite NEGATIVE NEGATIVE   Leukocytes,Ua TRACE (A) NEGATIVE   RBC / HPF 0-5 0 - 5 RBC/hpf   WBC, UA 11-20 0 - 5 WBC/hpf   Bacteria, UA NONE SEEN NONE SEEN   Mucus PRESENT    Hyaline Casts, UA PRESENT   Rapid urine drug screen (hospital performed)  Result Value Ref Range   Opiates NONE DETECTED NONE DETECTED   Cocaine NONE DETECTED  NONE DETECTED  Benzodiazepines NONE DETECTED NONE DETECTED   Amphetamines NONE DETECTED NONE DETECTED   Tetrahydrocannabinol NONE DETECTED NONE DETECTED   Barbiturates NONE DETECTED NONE DETECTED  Ethanol  Result Value Ref Range   Alcohol, Ethyl (B) <10 <10 mg/dL  Magnesium  Result Value Ref Range   Magnesium 1.6 (L) 1.7 - 2.4 mg/dL  Salicylate level  Result Value Ref Range   Salicylate Lvl <7.0 (L) 7.0 - 30.0 mg/dL  Acetaminophen level  Result Value Ref Range   Acetaminophen (Tylenol), Serum <10 (L) 10 - 30 ug/mL  Lactic acid, plasma  Result Value Ref Range   Lactic Acid, Venous 2.0 (HH) 0.5 - 1.9 mmol/L  CBC with Differential/Platelet  Result Value Ref Range   WBC 8.0 4.0 - 10.5 K/uL   RBC 2.84 (L) 3.87 - 5.11 MIL/uL   Hemoglobin 8.8 (L) 12.0 - 15.0 g/dL   HCT 16.1 (L) 09.6 - 04.5 %   MCV 93.0 80.0 - 100.0 fL   MCH 31.0 26.0 - 34.0 pg   MCHC 33.3 30.0 - 36.0 g/dL   RDW 40.9 (H) 81.1 - 91.4 %   Platelets 222 150 - 400 K/uL   nRBC 0.0 0.0 - 0.2 %   Neutrophils Relative % 83 %   Neutro Abs 6.7 1.7 - 7.7 K/uL   Lymphocytes Relative 9 %   Lymphs Abs 0.7 0.7 - 4.0 K/uL   Monocytes Relative 7 %   Monocytes Absolute 0.6 0.1 - 1.0 K/uL   Eosinophils Relative 0 %   Eosinophils Absolute 0.0 0.0 - 0.5 K/uL   Basophils Relative 0 %   Basophils Absolute 0.0 0.0 - 0.1 K/uL   Immature Granulocytes 1 %   Abs Immature Granulocytes 0.04 0.00 - 0.07 K/uL  Comprehensive metabolic panel  Result Value Ref Range   Sodium 142 135 - 145 mmol/L   Potassium 3.6 3.5 - 5.1 mmol/L   Chloride 111 98 - 111 mmol/L   CO2 21 (L) 22 - 32 mmol/L   Glucose, Bld 143 (H) 70 - 99 mg/dL   BUN 7 6 - 20 mg/dL   Creatinine, Ser 7.82 0.44 - 1.00 mg/dL   Calcium 8.6 (L) 8.9 - 10.3 mg/dL   Total Protein 5.8 (L) 6.5 - 8.1 g/dL   Albumin 3.0 (L) 3.5 - 5.0 g/dL   AST 13 (L) 15 - 41 U/L   ALT 8 0 - 44 U/L   Alkaline Phosphatase 39 38 - 126 U/L   Total Bilirubin 0.9 0.3 - 1.2 mg/dL   GFR, Estimated >95 >62  mL/min   Anion gap 10 5 - 15  Magnesium  Result Value Ref Range   Magnesium 1.8 1.7 - 2.4 mg/dL  Lactic acid, plasma  Result Value Ref Range   Lactic Acid, Venous 1.9 0.5 - 1.9 mmol/L  Protime-INR  Result Value Ref Range   Prothrombin Time 14.3 11.4 - 15.2 seconds   INR 1.1 0.8 - 1.2  POC CBG, ED  Result Value Ref Range   Glucose-Capillary 133 (H) 70 - 99 mg/dL  I-stat chem 8, ED  Result Value Ref Range   Sodium 142 135 - 145 mmol/L   Potassium 3.3 (L) 3.5 - 5.1 mmol/L   Chloride 106 98 - 111 mmol/L   BUN 8 6 - 20 mg/dL   Creatinine, Ser 1.30 0.44 - 1.00 mg/dL   Glucose, Bld 865 (H) 70 - 99 mg/dL   Calcium, Ion 7.84 1.15 - 1.40 mmol/L   TCO2 24 22 - 32  mmol/L   Hemoglobin 9.5 (L) 12.0 - 15.0 g/dL   HCT 95.6 (L) 21.3 - 08.6 %  I-Stat beta hCG blood, ED  Result Value Ref Range   I-stat hCG, quantitative <5.0 <5 mIU/mL   Comment 3             EKG EKG Interpretation  Date/Time:  Tuesday November 18 2021 20:45:52 EST Ventricular Rate:  79 PR Interval:  111 QRS Duration: 83 QT Interval:  458 QTC Calculation: 526 R Axis:   74 Text Interpretation: Sinus rhythm Borderline short PR interval Prolonged QT interval Nonspecific ST abnormality Confirmed by Cathren Laine ([id number]) on 11/18/2021 8:47:54 PM  Radiology MR BRAIN WO CONTRAST  Result Date: 11/18/2021 CLINICAL DATA:  Altered mental status, stroke suspected EXAM: MRI HEAD WITHOUT CONTRAST TECHNIQUE: Multiplanar, multiecho pulse sequences of the brain and surrounding structures were obtained without intravenous contrast. COMPARISON:  06/26/2021 MRI, correlation is also made with the 11/18/2021 CT head FINDINGS: Brain: No restricted diffusion to suggest acute or subacute infarct. No acute hemorrhage, mass, mass effect, or midline shift. Encephalomalacia in the left MCA distribution, consistent with expected evolution of the previously noted infarct. These areas are associated with hemosiderin deposition from remote petechial  hemorrhage. No hydrocephalus or extra-axial collection. Ex vacuo dilatation of the atrium and temporal horn of the left lateral ventricle. Vascular: Normal arterial flow voids. Skull and upper cervical spine: Normal marrow signal. Sinuses/Orbits: Clear paranasal sinuses. The orbits are unremarkable. Other: Fluid in the right mastoid air cells. IMPRESSION: No acute intracranial process. No evidence of acute or subacute infarct. Electronically Signed   By: Wiliam Ke M.D.   On: 11/18/2021 23:34   CT Head Wo Contrast  Result Date: 11/18/2021 CLINICAL DATA:  Altered mental status. EXAM: CT HEAD WITHOUT CONTRAST TECHNIQUE: Contiguous axial images were obtained from the base of the skull through the vertex without intravenous contrast. RADIATION DOSE REDUCTION: This exam was performed according to the departmental dose-optimization program which includes automated exposure control, adjustment of the mA and/or kV according to patient size and/or use of iterative reconstruction technique. COMPARISON:  None Available. FINDINGS: Brain: No evidence of acute infarction, hemorrhage, hydrocephalus, extra-axial collection or mass lesion/mass effect. A chronic left parietal lobe infarct is noted. Vascular: No hyperdense vessel or unexpected calcification. Skull: Normal. Negative for fracture or focal lesion. Sinuses/Orbits: No acute finding. Other: None. IMPRESSION: 1. No acute intracranial abnormality. 2. Chronic left parietal lobe infarct. Electronically Signed   By: Aram Candela M.D.   On: 11/18/2021 21:06   DG Chest Port 1 View  Result Date: 11/18/2021 CLINICAL DATA:  Altered mental status. EXAM: PORTABLE CHEST 1 VIEW COMPARISON:  October 15, 2021 FINDINGS: Multiple sternal wires are noted. The heart size and mediastinal contours are within normal limits. An artificial mitral valve is seen. Both lungs are clear. The visualized skeletal structures are unremarkable. IMPRESSION: No active disease. Electronically  Signed   By: Aram Candela M.D.   On: 11/18/2021 21:03    Procedures Procedures    Medications Ordered in ED Medications - No data to display  ED Course/ Medical Decision Making/ A&P                           Medical Decision Making Problems Addressed: Acute alteration in mental status: acute illness or injury with systemic symptoms that poses a threat to life or bodily functions Encephalopathy acute: acute illness or injury with systemic symptoms that poses a threat  to life or bodily functions H/O mitral valve repair: chronic illness or injury History of embolic stroke: chronic illness or injury History of schizophrenia: chronic illness or injury Hypokalemia: acute illness or injury Hypomagnesemia: acute illness or injury Miosis: acute illness or injury White blood cells present on urine microscopy: acute illness or injury  Amount and/or Complexity of Data Reviewed Independent Historian: EMS    Details: Family, hx Labs: ordered. Decision-making details documented in ED Course. Radiology: ordered and independent interpretation performed. Decision-making details documented in ED Course. ECG/medicine tests: ordered and independent interpretation performed. Decision-making details documented in ED Course.  Risk Prescription drug management. Decision regarding hospitalization.   Iv ns. Continuous pulse ox and cardiac monitoring. Labs ordered/sent. Imaging ordered.   Reviewed nursing notes and prior charts for additional history. External reports reviewed. Additional history from: EMS.   Cbg. Istat. Ct. Additional labs. Narcan iv - no immediate change in mental status.   Cardiac monitor: sinus rhythm, rate 80.  Labs reviewed/interpreted by me - wbc normal. UA w 11-20 wbc, will cx, and tx. Mg and K mildly low - iv replacement.   Xrays reviewed/interpreted by me - no pna.   CT reviewed/interpreted by me - no hem.   MRI - no acute cva.  Recheck, no neck stiffness or  rigidity. No focal pain/tenderness on spine and extremity exam. Abd soft nt. Chest cta.   2312, mri pending, signed out to Dr Preston Fleeting to check MRI, recheck pt, and facilitate admission.   Multiple rechecks, pt spontaneously moves 4 extremities. Is breathing comfortably. Continues to generally keep eyes closed, not respond to commands, but arouses/moves purposefully to stimuli.   CRITICAL CARE Re: acute alteration in mental status, encephalopathy - ?multifactorial, meds/polypharmacy, metabolic, mental health, hypokalemia, hypomagnesemia.  Performed by: Suzi Roots Total critical care time: 90 minutes Critical care time was exclusive of separately billable procedures and treating other patients. Critical care was necessary to treat or prevent imminent or life-threatening deterioration. Critical care was time spent personally by me on the following activities: development of treatment plan with patient and/or surrogate as well as nursing, discussions with consultants, evaluation of patient's response to treatment, examination of patient, obtaining history from patient or surrogate, ordering and performing treatments and interventions, ordering and review of laboratory studies, ordering and review of radiographic studies, pulse oximetry and re-evaluation of patient's condition.;             Final Clinical Impression(s) / ED Diagnoses Final diagnoses:  None    Rx / DC Orders ED Discharge Orders     None         Cathren Laine, MD 11/18/21 2313      Cathren Laine, MD 11/19/21 [phone]

## 2021-11-18 NOTE — ED Notes (Signed)
Called CT and requested stat CT.

## 2021-11-18 NOTE — ED Provider Notes (Signed)
Care assumed from Dr. Ashok Cordia, patient with altered mental status pending MRI scan of brain. Labs show hypokalemia, hypomagnesemia, possible UTI. Will need to be admitted.  MRI shows no acute process.  Specifically, no evidence of acute or subacute stroke.  I have independently viewed the images, and agree with the radiologist's interpretation.  Case is discussed with Dr. Velia Meyer of [facility], who agrees to admit the patient.  Results for orders placed or performed during the hospital encounter of 11/18/21  CBC  Result Value Ref Range   WBC 7.5 4.0 - 10.5 K/uL   RBC 3.02 (L) 3.87 - 5.11 MIL/uL   Hemoglobin 8.9 (L) 12.0 - 15.0 g/dL   HCT 28.8 (L) 36.0 - 46.0 %   MCV 95.4 80.0 - 100.0 fL   MCH 29.5 26.0 - 34.0 pg   MCHC 30.9 30.0 - 36.0 g/dL   RDW 19.4 (H) 11.5 - 15.5 %   Platelets 250 150 - 400 K/uL   nRBC 0.0 0.0 - 0.2 %  Comprehensive metabolic panel  Result Value Ref Range   Sodium 142 135 - 145 mmol/L   Potassium 3.3 (L) 3.5 - 5.1 mmol/L   Chloride 107 98 - 111 mmol/L   CO2 22 22 - 32 mmol/L   Glucose, Bld 141 (H) 70 - 99 mg/dL   BUN 9 6 - 20 mg/dL   Creatinine, Ser 0.92 0.44 - 1.00 mg/dL   Calcium 9.4 8.9 - 10.3 mg/dL   Total Protein 6.3 (L) 6.5 - 8.1 g/dL   Albumin 3.3 (L) 3.5 - 5.0 g/dL   AST 16 15 - 41 U/L   ALT 8 0 - 44 U/L   Alkaline Phosphatase 42 38 - 126 U/L   Total Bilirubin 1.1 0.3 - 1.2 mg/dL   GFR, Estimated >60 >60 mL/min   Anion gap 13 5 - 15  Urinalysis, Routine w reflex microscopic Urine, In & Out Cath  Result Value Ref Range   Color, Urine YELLOW YELLOW   APPearance HAZY (A) CLEAR   Specific Gravity, Urine 1.019 1.005 - 1.030   pH 6.0 5.0 - 8.0   Glucose, UA NEGATIVE NEGATIVE mg/dL   Hgb urine dipstick NEGATIVE NEGATIVE   Bilirubin Urine NEGATIVE NEGATIVE   Ketones, ur NEGATIVE NEGATIVE mg/dL   Protein, ur NEGATIVE NEGATIVE mg/dL   Nitrite NEGATIVE NEGATIVE   Leukocytes,Ua TRACE (A) NEGATIVE   RBC / HPF 0-5 0 - 5 RBC/hpf   WBC, UA 11-20 0  - 5 WBC/hpf   Bacteria, UA NONE SEEN NONE SEEN   Mucus PRESENT    Hyaline Casts, UA PRESENT   Rapid urine drug screen (hospital performed)  Result Value Ref Range   Opiates NONE DETECTED NONE DETECTED   Cocaine NONE DETECTED NONE DETECTED   Benzodiazepines NONE DETECTED NONE DETECTED   Amphetamines NONE DETECTED NONE DETECTED   Tetrahydrocannabinol NONE DETECTED NONE DETECTED   Barbiturates NONE DETECTED NONE DETECTED  Ethanol  Result Value Ref Range   Alcohol, Ethyl (B) <10 <10 mg/dL  Magnesium  Result Value Ref Range   Magnesium 1.6 (L) 1.7 - 2.4 mg/dL  Salicylate level  Result Value Ref Range   Salicylate Lvl <1.0 (L) 7.0 - 30.0 mg/dL  Acetaminophen level  Result Value Ref Range   Acetaminophen (Tylenol), Serum <10 (L) 10 - 30 ug/mL  Lactic acid, plasma  Result Value Ref Range   Lactic Acid, Venous 2.0 (HH) 0.5 - 1.9 mmol/L  POC CBG, ED  Result Value Ref Range  Glucose-Capillary 133 (H) 70 - 99 mg/dL  I-stat chem 8, ED  Result Value Ref Range   Sodium 142 135 - 145 mmol/L   Potassium 3.3 (L) 3.5 - 5.1 mmol/L   Chloride 106 98 - 111 mmol/L   BUN 8 6 - 20 mg/dL   Creatinine, Ser 0.90 0.44 - 1.00 mg/dL   Glucose, Bld 139 (H) 70 - 99 mg/dL   Calcium, Ion 1.20 1.15 - 1.40 mmol/L   TCO2 24 22 - 32 mmol/L   Hemoglobin 9.5 (L) 12.0 - 15.0 g/dL   HCT 28.0 (L) 36.0 - 46.0 %  I-Stat beta hCG blood, ED  Result Value Ref Range   I-stat hCG, quantitative <5.0 <5 mIU/mL   Comment 3           MR BRAIN WO CONTRAST  Result Date: 11/18/2021 CLINICAL DATA:  Altered mental status, stroke suspected EXAM: MRI HEAD WITHOUT CONTRAST TECHNIQUE: Multiplanar, multiecho pulse sequences of the brain and surrounding structures were obtained without intravenous contrast. COMPARISON:  06/26/2021 MRI, correlation is also made with the 11/18/2021 CT head FINDINGS: Brain: No restricted diffusion to suggest acute or subacute infarct. No acute hemorrhage, mass, mass effect, or midline shift.  Encephalomalacia in the left MCA distribution, consistent with expected evolution of the previously noted infarct. These areas are associated with hemosiderin deposition from remote petechial hemorrhage. No hydrocephalus or extra-axial collection. Ex vacuo dilatation of the atrium and temporal horn of the left lateral ventricle. Vascular: Normal arterial flow voids. Skull and upper cervical spine: Normal marrow signal. Sinuses/Orbits: Clear paranasal sinuses. The orbits are unremarkable. Other: Fluid in the right mastoid air cells. IMPRESSION: No acute intracranial process. No evidence of acute or subacute infarct. Electronically Signed   By: Merilyn Baba M.D.   On: 11/18/2021 23:34   CT Head Wo Contrast  Result Date: 11/18/2021 CLINICAL DATA:  Altered mental status. EXAM: CT HEAD WITHOUT CONTRAST TECHNIQUE: Contiguous axial images were obtained from the base of the skull through the vertex without intravenous contrast. RADIATION DOSE REDUCTION: This exam was performed according to the departmental dose-optimization program which includes automated exposure control, adjustment of the mA and/or kV according to patient size and/or use of iterative reconstruction technique. COMPARISON:  None Available. FINDINGS: Brain: No evidence of acute infarction, hemorrhage, hydrocephalus, extra-axial collection or mass lesion/mass effect. A chronic left parietal lobe infarct is noted. Vascular: No hyperdense vessel or unexpected calcification. Skull: Normal. Negative for fracture or focal lesion. Sinuses/Orbits: No acute finding. Other: None. IMPRESSION: 1. No acute intracranial abnormality. 2. Chronic left parietal lobe infarct. Electronically Signed   By: Virgina Norfolk M.D.   On: 11/18/2021 21:06   DG Chest Port 1 View  Result Date: 11/18/2021 CLINICAL DATA:  Altered mental status. EXAM: PORTABLE CHEST 1 VIEW COMPARISON:  October 15, 2021 FINDINGS: Multiple sternal wires are noted. The heart size and mediastinal  contours are within normal limits. An artificial mitral valve is seen. Both lungs are clear. The visualized skeletal structures are unremarkable. IMPRESSION: No active disease. Electronically Signed   By: Virgina Norfolk M.D.   On: 85/88/5027 74:12      Delora Fuel, MD 87/86/76 0009

## 2021-11-19 ENCOUNTER — Encounter (HOSPITAL_COMMUNITY): Payer: Self-pay | Admitting: Internal Medicine

## 2021-11-19 DIAGNOSIS — I69320 Aphasia following cerebral infarction: Secondary | ICD-10-CM | POA: Diagnosis not present

## 2021-11-19 DIAGNOSIS — E782 Mixed hyperlipidemia: Secondary | ICD-10-CM

## 2021-11-19 DIAGNOSIS — I10 Essential (primary) hypertension: Secondary | ICD-10-CM | POA: Diagnosis not present

## 2021-11-19 DIAGNOSIS — Z7982 Long term (current) use of aspirin: Secondary | ICD-10-CM | POA: Diagnosis not present

## 2021-11-19 DIAGNOSIS — E872 Acidosis, unspecified: Secondary | ICD-10-CM

## 2021-11-19 DIAGNOSIS — E860 Dehydration: Secondary | ICD-10-CM | POA: Diagnosis not present

## 2021-11-19 DIAGNOSIS — G9340 Encephalopathy, unspecified: Secondary | ICD-10-CM | POA: Diagnosis not present

## 2021-11-19 DIAGNOSIS — R9431 Abnormal electrocardiogram [ECG] [EKG]: Secondary | ICD-10-CM | POA: Diagnosis present

## 2021-11-19 DIAGNOSIS — E876 Hypokalemia: Secondary | ICD-10-CM | POA: Diagnosis not present

## 2021-11-19 DIAGNOSIS — E785 Hyperlipidemia, unspecified: Secondary | ICD-10-CM | POA: Diagnosis not present

## 2021-11-19 DIAGNOSIS — G9341 Metabolic encephalopathy: Secondary | ICD-10-CM | POA: Diagnosis not present

## 2021-11-19 DIAGNOSIS — Z8659 Personal history of other mental and behavioral disorders: Secondary | ICD-10-CM | POA: Diagnosis not present

## 2021-11-19 DIAGNOSIS — D509 Iron deficiency anemia, unspecified: Secondary | ICD-10-CM | POA: Diagnosis not present

## 2021-11-19 DIAGNOSIS — Z8679 Personal history of other diseases of the circulatory system: Secondary | ICD-10-CM | POA: Diagnosis not present

## 2021-11-19 DIAGNOSIS — N3000 Acute cystitis without hematuria: Secondary | ICD-10-CM | POA: Diagnosis present

## 2021-11-19 DIAGNOSIS — B965 Pseudomonas (aeruginosa) (mallei) (pseudomallei) as the cause of diseases classified elsewhere: Secondary | ICD-10-CM | POA: Diagnosis not present

## 2021-11-19 DIAGNOSIS — F209 Schizophrenia, unspecified: Secondary | ICD-10-CM | POA: Diagnosis present

## 2021-11-19 DIAGNOSIS — Z79899 Other long term (current) drug therapy: Secondary | ICD-10-CM | POA: Diagnosis not present

## 2021-11-19 LAB — I-STAT ARTERIAL BLOOD GAS, ED
Acid-Base Excess: 0 mmol/L (ref 0.0–2.0)
Bicarbonate: 23.5 mmol/L (ref 20.0–28.0)
Calcium, Ion: 1.28 mmol/L (ref 1.15–1.40)
HCT: 30 % — ABNORMAL LOW (ref 36.0–46.0)
Hemoglobin: 10.2 g/dL — ABNORMAL LOW (ref 12.0–15.0)
O2 Saturation: 96 %
Patient temperature: 98.5
Potassium: 3.6 mmol/L (ref 3.5–5.1)
Sodium: 143 mmol/L (ref 135–145)
TCO2: 24 mmol/L (ref 22–32)
pCO2 arterial: 33.8 mmHg (ref 32–48)
pH, Arterial: 7.449 (ref 7.35–7.45)
pO2, Arterial: 75 mmHg — ABNORMAL LOW (ref 83–108)

## 2021-11-19 LAB — CBC WITH DIFFERENTIAL/PLATELET
Abs Immature Granulocytes: 0.04 10*3/uL (ref 0.00–0.07)
Basophils Absolute: 0 10*3/uL (ref 0.0–0.1)
Basophils Relative: 0 %
Eosinophils Absolute: 0 10*3/uL (ref 0.0–0.5)
Eosinophils Relative: 0 %
HCT: 26.4 % — ABNORMAL LOW (ref 36.0–46.0)
Hemoglobin: 8.8 g/dL — ABNORMAL LOW (ref 12.0–15.0)
Immature Granulocytes: 1 %
Lymphocytes Relative: 9 %
Lymphs Abs: 0.7 10*3/uL (ref 0.7–4.0)
MCH: 31 pg (ref 26.0–34.0)
MCHC: 33.3 g/dL (ref 30.0–36.0)
MCV: 93 fL (ref 80.0–100.0)
Monocytes Absolute: 0.6 10*3/uL (ref 0.1–1.0)
Monocytes Relative: 7 %
Neutro Abs: 6.7 10*3/uL (ref 1.7–7.7)
Neutrophils Relative %: 83 %
Platelets: 222 10*3/uL (ref 150–400)
RBC: 2.84 MIL/uL — ABNORMAL LOW (ref 3.87–5.11)
RDW: 19.4 % — ABNORMAL HIGH (ref 11.5–15.5)
WBC: 8 10*3/uL (ref 4.0–10.5)
nRBC: 0 % (ref 0.0–0.2)

## 2021-11-19 LAB — TSH: TSH: 0.611 u[IU]/mL (ref 0.350–4.500)

## 2021-11-19 LAB — COMPREHENSIVE METABOLIC PANEL
ALT: 8 U/L (ref 0–44)
AST: 13 U/L — ABNORMAL LOW (ref 15–41)
Albumin: 3 g/dL — ABNORMAL LOW (ref 3.5–5.0)
Alkaline Phosphatase: 39 U/L (ref 38–126)
Anion gap: 10 (ref 5–15)
BUN: 7 mg/dL (ref 6–20)
CO2: 21 mmol/L — ABNORMAL LOW (ref 22–32)
Calcium: 8.6 mg/dL — ABNORMAL LOW (ref 8.9–10.3)
Chloride: 111 mmol/L (ref 98–111)
Creatinine, Ser: 0.8 mg/dL (ref 0.44–1.00)
GFR, Estimated: >60 mL/min (ref >60)
Glucose, Bld: 143 mg/dL — ABNORMAL HIGH (ref 70–99)
Potassium: 3.6 mmol/L (ref 3.5–5.1)
Sodium: 142 mmol/L (ref 135–145)
Total Bilirubin: 0.9 mg/dL (ref 0.3–1.2)
Total Protein: 5.8 g/dL — ABNORMAL LOW (ref 6.5–8.1)

## 2021-11-19 LAB — AMMONIA: Ammonia: <10 umol/L (ref 9–35)

## 2021-11-19 LAB — PROTIME-INR
INR: 1.1 (ref 0.8–1.2)
Prothrombin Time: 14.3 seconds (ref 11.4–15.2)

## 2021-11-19 LAB — MAGNESIUM: Magnesium: 1.8 mg/dL (ref 1.7–2.4)

## 2021-11-19 LAB — LACTIC ACID, PLASMA: Lactic Acid, Venous: 1.9 mmol/L (ref 0.5–1.9)

## 2021-11-19 LAB — CK: Total CK: 42 U/L (ref 38–234)

## 2021-11-19 LAB — VITAMIN B12: Vitamin B-12: 1151 pg/mL — ABNORMAL HIGH (ref 180–914)

## 2021-11-19 MED ORDER — LACTATED RINGERS IV SOLN: INTRAVENOUS | Status: AC

## 2021-11-19 MED ORDER — POLYSACCHARIDE IRON COMPLEX 150 MG PO CAPS
150.0000 mg | ORAL_CAPSULE | Freq: Two times a day (BID) | ORAL | Status: DC
  Administered 2021-11-19 – 2021-11-21 (×4): 150 mg via ORAL
  Filled 2021-11-19 (×6): qty 1

## 2021-11-19 MED ORDER — ACETAMINOPHEN 325 MG PO TABS: 650.0000 mg | ORAL_TABLET | Freq: Four times a day (QID) | ORAL | Status: DC | PRN

## 2021-11-19 MED ORDER — SODIUM CHLORIDE 0.9 % IV SOLN
1.0000 g | INTRAVENOUS | Status: DC
  Administered 2021-11-19 – 2021-11-20 (×2): 1 g via INTRAVENOUS
  Filled 2021-11-19 (×2): qty 10

## 2021-11-19 MED ORDER — ACETAMINOPHEN 650 MG RE SUPP: 650.0000 mg | Freq: Four times a day (QID) | RECTAL | Status: DC | PRN

## 2021-11-19 NOTE — ED Notes (Signed)
Pt in room resting. NAD chest rising and falling. Visitor at bedside.Updated on plan of care.

## 2021-11-19 NOTE — Progress Notes (Signed)
Patient admitted to the hospital earlier this morning by Dr. Velia Meyer  Patient seen and examined.  She is still somnolent although she does wake up to voice and answer some questions.  Speech is mildly dysarthric.  Strength is equal bilaterally in upper and lower extremities.  She does not have any facial asymmetry.  Assessment/plan  Acute encephalopathy -Etiology of change in mental status is not entirely clear -She is being treated for possible UTI, urine culture in process -MRI brain did not show any acute findings -We will pursue further metabolic work-up including ammonia, B12, ABG, TSH -She also may have a component of polypharmacy due to her psychotropic medications.  These have been held for now. -Continue IV fluids and monitor mental status  Hypokalemia -Replace  Hypomagnesemia -Replace  Possible UTI -Started on ceftriaxone for now -Follow-up urine culture  Mild lactic acidosis -Suspect is related to dehydration -Improving with IV fluids  Chronic iron deficiency anemia -Hemoglobin near baseline, continue to monitor  [employer]

## 2021-11-19 NOTE — Progress Notes (Signed)
Patient arrived in the unit at 2200 from [facility], A&Ox3, seems sleepy, denied of acute pain, initiated telemetry monitoring, and will continue to monitor.

## 2021-11-19 NOTE — H&P (Signed)
History and Physical    PLEASE NOTE THAT DRAGON DICTATION SOFTWARE WAS USED IN THE CONSTRUCTION OF THIS NOTE.   Debra Klein HBZ:[id number] DOB: 02/22/1975 DOA: 11/18/2021  PCP: Gaynelle Arabian, MD  Patient coming from: home   I have personally briefly reviewed patient's old medical records in [place]  Chief Complaint: Altered mental status  HPI: Debra Klein is a 46 y.o. female with medical history significant for ischemic CVA, schizophrenia, hyperlipidemia, chronic iron deficiency anemia associated with baseline hemoglobin range 8.5-10, status post mitral valve repair in June 2023, who is admitted to [facility] on 11/18/2021 with acute encephalopathy after presenting from home to [facility] ED for evaluation of altered mental status.  In the setting of the patient's altered mental status, the following history is provided by the patient's family, as well as my discussions with the EDP and via chart review.  Over the course the last day, the patient has been confused and somnolent, representing a departure from her baseline mental status.  No recent reported trauma nor any report of recent subjective fever, chills or rigors, or generalized myalgias.  It is noted that the patient has a history of prior ischemic CVA.   She also has a history of schizophrenia on multiple central acting medications at home, including clozapine, benztropine, and fluphenazine.       ED Course:  Vital signs in the ED were notable for the following: Afebrile; heart rate 10-1 11; systolic blood pressures in the 120s to 150s; respiratory rate 15-22, oxygen saturation 99 to 100% on room air.  Labs were notable for the following: BMP notable for the following: Sodium 142, potassium 3.3, bicarbonate 22, creatinine 0.92, glucose 141.  Serum magnesium level 1.6.  CBC notable for white blood cell count 7500, hemoglobin 8.9 compared to most recent prior value of 10.5 on 10/15/2021.  Lactic acid 2.0.   Urinalysis was sissy with a hazy appearing specimen and notable for 11-20 white blood cells, trace leukocyte esterase, no squamous epithelial cells, urine demonstrated the presence of hyaline casts as well as specific gravity 1.0 1 9.  Urinary drug screen is pan negative.  Blood cultures x2 and urine culture were collected prior to initiation of IV antibiotics.  Imaging and additional notable ED work-up: EKG shows sinus rhythm with heart rate 79, prolonged QTc of 5-6, and no evidence of T wave or ST changes, including no evidence of ST elevation.  Chest x-ray shows no evidence of acute cardiopulmonary process.  Noncontrast CT head showed no evidence of acute intracranial process, including no evidence of intracranial hemorrhage.  MRI brain showed no evidence of acute intracranial process, including no evidence of acute infarct.  While in the ED, the following were administered: Rocephin, magnesium sulfate 1 g IV, normal saline x1 L bolus; potassium chloride 20 mill colons IV over 2 hours.  Subsequently, the patient was admitted for further evaluation management of acute encephalopathy, with potential underlying urinary tract infection, and presenting labs notable for hypokalemia, hypomagnesemia, lactic acidosis, and additional preliminary work-up notable for QTc prolongation.     Review of Systems: As per HPI otherwise 10 point review of systems negative.   Past Medical History:  Diagnosis Date   Hyperlipemia    Hypertension    IDA (iron deficiency anemia)    Schizophrenia ([place])     Past Surgical History:  Procedure Laterality Date   KNEE ARTHROSCOPY Left 06/24/2021   Procedure: ARTHROSCOPY LEFT KNEE IRRIGRATION AND [place];  Surgeon: Ninfa Linden,  Lind Guest, MD;  Location: [place];  Service: Orthopedics;  Laterality: Left;   MITRAL VALVE REPAIR N/A 06/27/2021   Procedure: MITRAL VALVE REPAIR USING 28MM SIMUFORM SEMI-RIGID ANNULOPLASTY RING;  Surgeon: Melrose Nakayama,  MD;  Location: [place];  Service: Open Heart Surgery;  Laterality: N/A;   No previous surgery     TEE WITHOUT CARDIOVERSION N/A 06/27/2021   Procedure: TRANSESOPHAGEAL ECHOCARDIOGRAM (TEE);  Surgeon: Melrose Nakayama, MD;  Location: [place];  Service: Open Heart Surgery;  Laterality: N/A;    Social History:  reports that she has never smoked. She has never used smokeless tobacco. She reports that she does not currently use alcohol. She reports that she does not currently use drugs.   No Known Allergies  History reviewed. No pertinent family history.   Prior to Admission medications   Medication Sig Start Date End Date Taking? Authorizing Provider  aspirin 81 MG chewable tablet Chew 1 tablet (81 mg total) by mouth daily. 07/18/21   Shelly Coss, MD  atorvastatin (LIPITOR) 20 MG tablet Take 20 mg by mouth daily. 06/16/20   [provider]  benztropine (COGENTIN) 1 MG tablet Take 1 mg by mouth at bedtime. 05/08/20   [provider]  cloZAPine (CLOZARIL) 50 MG tablet Take 1 tablet (50 mg total) by mouth at bedtime. 07/17/21   Shelly Coss, MD  fluPHENAZine (PROLIXIN) 1 MG tablet Take 1 tablet (1 mg total) by mouth at bedtime. 07/17/21   Shelly Coss, MD  iron polysaccharides (NIFEREX) 150 MG capsule Take 150 mg by mouth 2 (two) times daily. 05/30/20   [provider]  vitamin B-12 1000 MCG tablet Take 1 tablet (1,000 mcg total) by mouth daily. 07/18/21   Shelly Coss, MD     Objective    Physical Exam: Vitals:   11/18/21 2115 11/18/21 2210 11/18/21 2230 11/19/21 0000  BP: (!) 151/84 (!) 142/85 (!) 141/92 (!) 152/88  Pulse: 88 100 95 (!) 111  Resp: 19 (!) _0 Temp:      TempSrc:      SpO2: 100% 100% 99% 100%    General: appears to be stated age; somnolent, confused, Skin: warm, dry, no rash Head:  AT/[place] Mouth:  Oral mucosa membranes appear dry, normal dentition Neck: supple; trachea midline Heart:  RRR; did not appreciate any M/R/G Lungs:  CTAB, did not appreciate any wheezes, rales, or rhonchi Abdomen: + BS; soft, ND, NT Vascular: 2+ pedal pulses b/l; 2+ radial pulses b/l Extremities: no peripheral edema, no muscle wasting Neuro: In the setting of the patient's current mental status and associated inability to follow instructions, unable to perform full neurologic exam at this time.  As such, assessment of strength, sensation, and cranial nerves is limited at this time. Patient noted to spontaneously move all 4 extremities. No tremors.      Labs on Admission: I have personally reviewed following labs and imaging studies  CBC: Recent Labs  Lab 11/18/21 2028 11/18/21 2042  WBC 7.5  --   HGB 8.9* 9.5*  HCT 28.8* 28.0*  MCV 95.4  --   PLT 250  --    Basic Metabolic Panel: Recent Labs  Lab 11/18/21 2028 11/18/21 2042  NA 142 142  K 3.3* 3.3*  CL 107 106  CO2 22  --   GLUCOSE 141* 139*  BUN 9 8  CREATININE 0.92 0.90  CALCIUM 9.4  --   MG 1.6*  --    GFR: Estimated Creatinine Clearance: 71.8  mL/min (by C-G formula based on SCr of 0.9 mg/dL). Liver Function Tests: Recent Labs  Lab 11/18/21 2028  AST 16  ALT 8  ALKPHOS 42  BILITOT 1.1  PROT 6.3*  ALBUMIN 3.3*   No results for input(s): "LIPASE", "AMYLASE" in the last 168 hours. No results for input(s): "AMMONIA" in the last 168 hours. Coagulation Profile: No results for input(s): "INR", "PROTIME" in the last 168 hours. Cardiac Enzymes: No results for input(s): "CKTOTAL", "CKMB", "CKMBINDEX", "TROPONINI" in the last 168 hours. BNP (last 3 results) No results for input(s): "PROBNP" in the last 8760 hours. HbA1C: No results for input(s): "HGBA1C" in the last 72 hours. CBG: Recent Labs  Lab 11/18/21 2045  GLUCAP 133*   Lipid Profile: No results for input(s): "CHOL", "HDL", "LDLCALC", "TRIG", "CHOLHDL", "LDLDIRECT" in the last 72 hours. Thyroid Function Tests: No results for input(s): "TSH", "T4TOTAL", "FREET4", "T3FREE", "THYROIDAB" in the last  72 hours. Anemia Panel: No results for input(s): "VITAMINB12", "FOLATE", "FERRITIN", "TIBC", "IRON", "RETICCTPCT" in the last 72 hours. Urine analysis:    Component Value Date/Time   COLORURINE YELLOW 11/18/2021 2123   APPEARANCEUR HAZY (A) 11/18/2021 2123   LABSPEC 1.019 11/18/2021 2123   PHURINE 6.0 11/18/2021 2123   GLUCOSEU NEGATIVE 11/18/2021 2123   HGBUR NEGATIVE 11/18/2021 2123   BILIRUBINUR NEGATIVE 11/18/2021 2123   KETONESUR NEGATIVE 11/18/2021 2123   PROTEINUR NEGATIVE 11/18/2021 2123   UROBILINOGEN 0.2 06/25/2007 2223   NITRITE NEGATIVE 11/18/2021 2123   LEUKOCYTESUR TRACE (A) 11/18/2021 2123    Radiological Exams on Admission: MR BRAIN WO CONTRAST  Result Date: 11/18/2021 CLINICAL DATA:  Altered mental status, stroke suspected EXAM: MRI HEAD WITHOUT CONTRAST TECHNIQUE: Multiplanar, multiecho pulse sequences of the brain and surrounding structures were obtained without intravenous contrast. COMPARISON:  06/26/2021 MRI, correlation is also made with the 11/18/2021 CT head FINDINGS: Brain: No restricted diffusion to suggest acute or subacute infarct. No acute hemorrhage, mass, mass effect, or midline shift. Encephalomalacia in the left MCA distribution, consistent with expected evolution of the previously noted infarct. These areas are associated with hemosiderin deposition from remote petechial hemorrhage. No hydrocephalus or extra-axial collection. Ex vacuo dilatation of the atrium and temporal horn of the left lateral ventricle. Vascular: Normal arterial flow voids. Skull and upper cervical spine: Normal marrow signal. Sinuses/Orbits: Clear paranasal sinuses. The orbits are unremarkable. Other: Fluid in the right mastoid air cells. IMPRESSION: No acute intracranial process. No evidence of acute or subacute infarct. Electronically Signed   By: Merilyn Baba M.D.   On: 11/18/2021 23:34   CT Head Wo Contrast  Result Date: 11/18/2021 CLINICAL DATA:  Altered mental status. EXAM: CT  HEAD WITHOUT CONTRAST TECHNIQUE: Contiguous axial images were obtained from the base of the skull through the vertex without intravenous contrast. RADIATION DOSE REDUCTION: This exam was performed according to the departmental dose-optimization program which includes automated exposure control, adjustment of the mA and/or kV according to patient size and/or use of iterative reconstruction technique. COMPARISON:  None Available. FINDINGS: Brain: No evidence of acute infarction, hemorrhage, hydrocephalus, extra-axial collection or mass lesion/mass effect. A chronic left parietal lobe infarct is noted. Vascular: No hyperdense vessel or unexpected calcification. Skull: Normal. Negative for fracture or focal lesion. Sinuses/Orbits: No acute finding. Other: None. IMPRESSION: 1. No acute intracranial abnormality. 2. Chronic left parietal lobe infarct. Electronically Signed   By: Virgina Norfolk M.D.   On: 11/18/2021 21:06   DG Chest Port 1 View  Result Date: 11/18/2021 CLINICAL DATA:  Altered mental  status. EXAM: PORTABLE CHEST 1 VIEW COMPARISON:  October 15, 2021 FINDINGS: Multiple sternal wires are noted. The heart size and mediastinal contours are within normal limits. An artificial mitral valve is seen. Both lungs are clear. The visualized skeletal structures are unremarkable. IMPRESSION: No active disease. Electronically Signed   By: Virgina Norfolk M.D.   On: 11/18/2021 21:03     EKG: Independently reviewed, with result as described above.    Assessment/Plan   Principal Problem:   Acute encephalopathy Active Problems:   HLD (hyperlipidemia)   Chronic iron deficiency anemia   Lactic acidosis   Hypokalemia   Hypomagnesemia   Acute cystitis   QT prolongation      #) Acute encephalopathy: 1 day of confusion/somnolence relative to baseline mental status.  Underlying etiology is not entirely clear at this time although there are potential contributing metabolic factors, including potential  urinary tract infection given the degree of pyuria associated with  hazy appear urinary specimen.  We will consequently continue treatment for noncomplicated urinary tract infection, as further detailed below.  There may also be an element of mild dehydration given clinical evidence of such including the appearance of dry oral mucous membranes as well as the presence of hyaline casts associated with presenting urinalysis.  No other source of underlying infectious process identified at this time, including chest x-ray which showed no evidence of acute cardiopulmonary process, including evidence of infiltrate.  Given the patient's history of prior ischemic CVA, MRI brain was obtained and showed no evidence of acute process, including no evidence of acute ischemic infarct.  Will further expand evaluation for additional metabolic sources of the patient's presenting acute encephalopathy.  There may also be a mental health contribution given the patient's history of underlying schizophrenia, as well as potential associated pharmacologic contributions given her several central acting medications at home.   Plan: fall precautions. Repeat CMP/CBC in the AM.  Repeat magnesium level. Check TSH, CK level. NPO pending nursing bedside swallow evaluation prior to the initiation of a diet/oral medications, as described above.   hold central-acting home medications to allow a washout period in order to reduce this potential confounding vs contributory pharmacologic variable.  Gentle IV fluids.  Further evaluation.  Intermittent of potential acute cystitis, as further detailed below.  Every 4 hours neurochecks.  Check INR.           #) Hypokalemia: Presenting serum potassium level found to be 3.3, status post ensuing administration of 30 mill equivalents of IV potassium chloride in the emergency department this evening.  Plan: Repeat CMP in the morning.  Monitor on telemetry.  Further evaluation and management of  concomitant hypomagnesemia, as further detailed below.              #) Hypomagnesemia: Presenting serum magnesium level 1.6, notable, particularly in the setting of QTc prolongation as well as concomitant hypokalemia, as further detailed above.  She has received 1 g of IV magnesium sulfate in the emergency department this evening.  Plan: Monitor on symmetry.  Repeat serum magnesium level in the morning.  Further evaluation management of QTc prolongation, as further detailed below.            #) Lactic acidosis: Mild elevated lactate of 2.0.  Potential contributions from dehydration.  Will provide gentle IV fluids, plan to repeat lactate in the morning, while further expanding evaluation for underlying additional etiologies, as further detailed below.  Plan: Lactated Ringer's at 100 cc/h.  Repeat lactate in the morning.  Check CPK level.  Check INR.  Monitor strict I's and O's and daily weights.  CMP in the morning.  Further evaluation management of presenting urinary tract infection, as further detailed below.            #) Acute cystitis: Borderline significant pyuria on presenting urinalysis, but with the presence of leukocyte Estrace as well as a hazy associated urine specimen.  Concomitant with presenting acute encephalopathy, will treat as if this represents a urinary tract infection, noting recommended 3 to 5-day course of appropriate IV antibiotics, with Rocephin administered in the ED this evening after collection of urine culture as well as blood cultures x2.  Of note, in the absence of leukocytosis or fever, SIRS criteria are not met.  Therefore, criteria for sepsis not met at this time.  Plan: Continue Rocephin.  Follow for results of blood and urine cultures.  Continuous IV fluids, as above.  Monitor strict I's and O's and O weights.  Repeat CMP/CBC in the morning.               #) QTc prolongation: Presenting EKG demonstrates QTc of 526 ms.  outpatient medications that may be contributing to QTc prolongation: Due to her multiple central acting medications, as described above.  Presenting mild hypomagnesemia is also notable.   Plan: Monitor on telemetry.  Repeat serum magnesium level in the morning.  Repeat EKG in the morning to monitor interval degree of QTc prolongation.  Hold outpatient central acting medications that can be associated with QTc prolongation.Marland Kitchen              #) Hyperlipidemia: documented h/o such. On atorvastatin as outpatient.    Plan: Hold home statin for now pending CPK result.             #) chronic iron deficiency anemia: Documented history of such, a/w with baseline hgb range 8.5-10, with presenting hgb consistent with this range, in the absence of any overt evidence of active bleed.  She is on daily oral iron supplementation at home.   Plan: Repeat CBC in the morning.  Check INR.  Continue home daily oral iron supplementation.          DVT prophylaxis: SCD's   Code Status: Full code Family Communication: none Disposition Plan: Per Rounding Team Consults called: none;  Admission status: Inpatient    PLEASE NOTE THAT DRAGON DICTATION SOFTWARE WAS USED IN THE CONSTRUCTION OF THIS NOTE.   [place] DO [facility]  From [place]   11/19/2021, 12:19 AM

## 2021-11-20 DIAGNOSIS — Z8659 Personal history of other mental and behavioral disorders: Secondary | ICD-10-CM

## 2021-11-20 LAB — CBC
HCT: 28.7 % — ABNORMAL LOW (ref 36.0–46.0)
Hemoglobin: 9 g/dL — ABNORMAL LOW (ref 12.0–15.0)
MCH: 29.4 pg (ref 26.0–34.0)
MCHC: 31.4 g/dL (ref 30.0–36.0)
MCV: 93.8 fL (ref 80.0–100.0)
Platelets: 280 10*3/uL (ref 150–400)
RBC: 3.06 MIL/uL — ABNORMAL LOW (ref 3.87–5.11)
RDW: 19.4 % — ABNORMAL HIGH (ref 11.5–15.5)
WBC: 7.7 10*3/uL (ref 4.0–10.5)
nRBC: 0 % (ref 0.0–0.2)

## 2021-11-20 LAB — BASIC METABOLIC PANEL
Anion gap: 8 (ref 5–15)
BUN: <5 mg/dL — ABNORMAL LOW (ref 6–20)
CO2: 23 mmol/L (ref 22–32)
Calcium: 9 mg/dL (ref 8.9–10.3)
Chloride: 109 mmol/L (ref 98–111)
Creatinine, Ser: 0.79 mg/dL (ref 0.44–1.00)
GFR, Estimated: >60 mL/min (ref >60)
Glucose, Bld: 93 mg/dL (ref 70–99)
Potassium: 3.4 mmol/L — ABNORMAL LOW (ref 3.5–5.1)
Sodium: 140 mmol/L (ref 135–145)

## 2021-11-20 MED ORDER — SODIUM CHLORIDE 0.9 % IV SOLN
2.0000 g | Freq: Two times a day (BID) | INTRAVENOUS | Status: DC
  Administered 2021-11-20 – 2021-11-21 (×2): 2 g via INTRAVENOUS
  Filled 2021-11-20 (×2): qty 12.5

## 2021-11-20 MED ORDER — POTASSIUM CHLORIDE CRYS ER 20 MEQ PO TBCR
40.0000 meq | EXTENDED_RELEASE_TABLET | ORAL | Status: AC
  Administered 2021-11-20 (×2): 40 meq via ORAL
  Filled 2021-11-20 (×2): qty 2

## 2021-11-20 MED ORDER — MAGNESIUM SULFATE 2 GM/50ML IV SOLN
2.0000 g | Freq: Once | INTRAVENOUS | Status: AC
  Administered 2021-11-20: 2 g via INTRAVENOUS
  Filled 2021-11-20: qty 50

## 2021-11-20 NOTE — TOC Initial Note (Signed)
Transition of Care (TOC) - Initial/Assessment Note    Patient Details  Name: Debra Klein MRN: [id number] Date of Birth: 04/19/1975  Transition of Care (TOC) CM/SW Contact:     F , RN Phone Number: 11/20/2021, 2:46 PM  Clinical Narrative:                 CM met with the patient. She was very sleepy but did manage to get from her that she lives at home with her mother.  She has a walker at home.  Her mother does the transportation and manages her medications. TOC following for d/c needs.   Expected Discharge Plan: Home/Self Care Barriers to Discharge: Continued Medical Work up   Patient Goals and CMS Choice        Expected Discharge Plan and Services Expected Discharge Plan: Home/Self Care   Discharge Planning Services: CM Consult   Living arrangements for the past 2 months: Single Family Home                                      Prior Living Arrangements/Services Living arrangements for the past 2 months: Single Family Home Lives with:: Parents Patient language and need for interpreter reviewed:: Yes Do you feel safe going back to the place where you live?: Yes        Care giver support system in place?: Yes (comment)   Criminal Activity/Legal Involvement Pertinent to Current Situation/Hospitalization: No - Comment as needed  Activities of Daily Living      Permission Sought/Granted                  Emotional Assessment Appearance:: Appears stated age Attitude/Demeanor/Rapport: Lethargic   Orientation: : Oriented to Self, Oriented to Place, Oriented to  Time   Psych Involvement: No (comment)  Admission diagnosis:  Hypokalemia [E87.6] Hypomagnesemia [E83.42] Miosis [H57.03] Encephalopathy acute [G93.40] History of schizophrenia [Z86.59] Acute encephalopathy [G93.40] History of embolic stroke [Z86.73] H/O mitral valve repair [Z98.890] Acute alteration in mental status [R41.82] White blood cells present on urine microscopy  [R82.998] Patient Active Problem List   Diagnosis Date Noted   Acute encephalopathy 11/19/2021   Hypomagnesemia 11/19/2021   Acute cystitis 11/19/2021   QT prolongation 11/19/2021   Acute bacterial endocarditis    Pressure injury of skin 06/28/2021   S/P mitral valve repair 06/27/2021   Septic arthritis of knee, left (HCC) 06/21/2021   Bacteremia due to methicillin susceptible Staphylococcus aureus (MSSA) 06/20/2021   Toxic encephalopathy 06/20/2021   Cerebrovascular accident (CVA) due to embolic occlusion of left middle cerebral artery (HCC) 06/20/2021   Acute respiratory failure with hypoxia (HCC) 06/20/2021   Essential hypertension 06/19/2021   HLD (hyperlipidemia) 06/19/2021   Chronic iron deficiency anemia 06/19/2021   Prediabetes 06/19/2021   Schizophrenia (HCC) 06/19/2021   Sepsis (HCC) 06/19/2021   Lactic acidosis 06/19/2021   Transaminitis 06/19/2021   AKI (acute kidney injury) (HCC) 06/19/2021   Hypokalemia 06/19/2021   PCP:  Ehinger, Robert, MD Pharmacy:   [id number] - [place], [place] - [address] [place] [address] Phone: [phone] Fax: [phone]     Social Determinants of Health (SDOH) Interventions    Readmission Risk Interventions     No data to display           

## 2021-11-20 NOTE — Progress Notes (Signed)
Pharmacy Antibiotic Note  Debra Klein is a 46 y.o. female admitted on 11/18/2021 with  AMS .  Pharmacy has been consulted for cefepime dosing for Pseudomonas UTI; previously on ceftriaxone.  11/7 UCx: Pseudomonas aeruginosa - sensitivities pending  She is afebrile, WBC are normal, and renal function is normal.  Plan: Cefepime 2 g IV q12h Monitor renal function, clinical progress, cultures/sensitivities F/U LOT   Weight: 71.8 kg (158 lb 4.6 oz)  Temp (24hrs), Avg:98.5 F (36.9 C), Min:98 F (36.7 C), Max:99.3 F (37.4 C)  Recent Labs  Lab 11/18/21 2028 11/18/21 2042 11/18/21 2145 11/19/21 0045 11/19/21 0355 11/20/21 0702  WBC 7.5  --   --  8.0  --  7.7  CREATININE 0.92 0.90  --  0.80  --  0.79  LATICACIDVEN  --   --  2.0*  --  1.9  --     Estimated Creatinine Clearance: 88.2 mL/min (by C-G formula based on SCr of 0.79 mg/dL).    No Known Allergies  Thank you for involving pharmacy in this patient's care.  Renold Genta, PharmD, BCPS Clinical Pharmacist Clinical phone for 11/20/2021 until 10p is [phone] 11/20/2021 6:24 PM

## 2021-11-20 NOTE — Consult Note (Signed)
WOC Nurse Consult Note: Reason for Consult: pressure injury Wound type: right heel; unstageable pressure injury Pressure Injury POA: Yes Measurement: 1.0cm x 1.0cm x 0.1cm  Wound bed:100% soft black eschar  Drainage (amount, consistency, odor) none Periwound: intact; evidence of healing at wound edges  Dressing procedure/placement/frequency:  Paint right heel with betadine daily; allow to air dry.  Offload heels with Prevelon boots bilaterally at all times; send with patient for use after DC; discussed this with patient.   Discussed POC with patient and bedside nurse.  Re consult if needed, will not follow at this time. Thanks  Coree Riester [employer], RN,CWOCN, CNS, [place] [phone])

## 2021-11-20 NOTE — Progress Notes (Addendum)
PROGRESS NOTE    Debra Klein  OJJ:[id number] DOB: [birth year] DOA: 11/18/2021 PCP: Gaynelle Arabian, MD    Brief Narrative:  46 year old female with history of schizophrenia on psychotropic medications, admitted to the hospital with worsening mental status.  Urine culture positive for Pseudomonas, started on intravenous antibiotics.   Assessment & Plan:   Principal Problem:   Acute encephalopathy Active Problems:   HLD (hyperlipidemia)   Chronic iron deficiency anemia   Lactic acidosis   Hypokalemia   Hypomagnesemia   Acute cystitis   QT prolongation   Acute metabolic encephalopathy -Etiology of change in mental status is not entirely clear -She is being treated for UTI, urine culture positive for Pseudomonas -MRI brain did not show any acute findings -Further metabolic work-up with ammonia, B12, ABG and TSH unrevealing -She also may have a component of polypharmacy due to her psychotropic medications.  These have been held for now. -Continue IV fluids and monitor mental status -Mental status has not returned to baseline yet   Hypokalemia -Replace   Hypomagnesemia -Replace   Possible UTI -Started on ceftriaxone -Since urine culture positive for Pseudomonas, plan was to change to fluoroquinolone, although with QT prolongation, will switch to cefepime for now   Mild lactic acidosis -Suspect is related to dehydration -Improving with IV fluids   Chronic iron deficiency anemia -Hemoglobin near baseline, continue to monitor  QT prolongation -Replace magnesium and potassium   DVT prophylaxis: SCDs Start: 11/19/21 0018  Code Status: Full code Family Communication: Updated patient's mother at the bedside Disposition Plan: Status is: Inpatient Remains inpatient appropriate because: Continuing to encephalopathy and need for IV antibiotics     Consultants:    Procedures:    Antimicrobials:  Ceftriaxone 11/8 > 11/9 Cefepime 11/9 >   Subjective: Patient  remains somnolent.  She does wake up to voice and is able to answer questions.  Her mom does not feel she is back to baseline yet.  Objective: Vitals:   11/20/21 0500 11/20/21 0747 11/20/21 1140 11/20/21 1452  BP:  136/87 127/71 132/74  Pulse:  93 99 99  Resp:  18 20   Temp:  98.5 F (36.9 C) 98.4 F (36.9 C) 98.5 F (36.9 C)  TempSrc:  Oral Oral Oral  SpO2:  100% 100% 100%  Weight: 71.8 kg       Intake/Output Summary (Last 24 hours) at 11/20/2021 1812 Last data filed at 11/20/2021 0041 Gross per 24 hour  Intake --  Output 300 ml  Net -300 ml   Filed Weights   11/20/21 0500  Weight: 71.8 kg    Examination:  General exam: Appears calm and comfortable  Respiratory system: Clear to auscultation. Respiratory effort normal. Cardiovascular system: S1 & S2 heard, RRR. No JVD, murmurs, rubs, gallops or clicks. No pedal edema. Gastrointestinal system: Abdomen is nondistended, soft and nontender. No organomegaly or masses felt. Normal bowel sounds heard. Central nervous system: No focal neurological deficits. Extremities: Symmetric 5 x 5 power. Skin: No rashes, lesions or ulcers Psychiatry:somnolent but pleasant     Data Reviewed: I have personally reviewed following labs and imaging studies  CBC: Recent Labs  Lab 11/18/21 2028 11/18/21 2042 11/19/21 0045 11/19/21 1512 11/20/21 0702  WBC 7.5  --  8.0  --  7.7  NEUTROABS  --   --  6.7  --   --   HGB 8.9* 9.5* 8.8* 10.2* 9.0*  HCT 28.8* 28.0* 26.4* 30.0* 28.7*  MCV 95.4  --  93.0  --  93.8  PLT 250  --  222  --  258   Basic Metabolic Panel: Recent Labs  Lab 11/18/21 2028 11/18/21 2042 11/19/21 0045 11/19/21 1512 11/20/21 0702  NA 142 142 142 143 140  K 3.3* 3.3* 3.6 3.6 3.4*  CL 107 106 111  --  109  CO2 22  --  21*  --  23  GLUCOSE 141* 139* 143*  --  93  BUN 9 8 7   --  <5*  CREATININE 0.92 0.90 0.80  --  0.79  CALCIUM 9.4  --  8.6*  --  9.0  MG 1.6*  --  1.8  --   --    GFR: Estimated Creatinine  Clearance: 88.2 mL/min (by C-G formula based on SCr of 0.79 mg/dL). Liver Function Tests: Recent Labs  Lab 11/18/21 2028 11/19/21 0045  AST 16 13*  ALT 8 8  ALKPHOS 42 39  BILITOT 1.1 0.9  PROT 6.3* 5.8*  ALBUMIN 3.3* 3.0*   No results for input(s): "LIPASE", "AMYLASE" in the last 168 hours. Recent Labs  Lab 11/19/21 1516  AMMONIA <10   Coagulation Profile: Recent Labs  Lab 11/19/21 0410  INR 1.1   Cardiac Enzymes: Recent Labs  Lab 11/19/21 1500  CKTOTAL 42   BNP (last 3 results) No results for input(s): "PROBNP" in the last 8760 hours. HbA1C: No results for input(s): "HGBA1C" in the last 72 hours. CBG: Recent Labs  Lab 11/18/21 2045  GLUCAP 133*   Lipid Profile: No results for input(s): "CHOL", "HDL", "LDLCALC", "TRIG", "CHOLHDL", "LDLDIRECT" in the last 72 hours. Thyroid Function Tests: Recent Labs    11/19/21 1500  TSH 0.611   Anemia Panel: Recent Labs    11/19/21 1516  VITAMINB12 1,151*   Sepsis Labs: Recent Labs  Lab 11/18/21 2145 11/19/21 0355  LATICACIDVEN 2.0* 1.9    Recent Results (from the past 240 hour(s))  Blood culture (routine x 2)     Status: None (Preliminary result)   Collection Time: 11/18/21  8:49 PM   Specimen: BLOOD RIGHT HAND  Result Value Ref Range Status   Specimen Description BLOOD RIGHT HAND  Final   Special Requests   Final    BOTTLES DRAWN AEROBIC ONLY Blood Culture adequate volume   Culture   Final    NO GROWTH 2 DAYS Performed at [facility], [place] [address], [place], [place] [address]    Report Status PENDING  Incomplete  Urine Culture     Status: Abnormal (Preliminary result)   Collection Time: 11/18/21  9:23 PM   Specimen: In/Out Cath Urine  Result Value Ref Range Status   Specimen Description IN/OUT CATH URINE  Final   Special Requests NONE  Final   Culture (A)  Final    80,000 COLONIES/mL PSEUDOMONAS AERUGINOSA SUSCEPTIBILITIES TO FOLLOW Performed at [facility], [place] [address],  [place], [place] [address]    Report Status PENDING  Incomplete         Radiology Studies: MR BRAIN WO CONTRAST  Result Date: 11/18/2021 CLINICAL DATA:  Altered mental status, stroke suspected EXAM: MRI HEAD WITHOUT CONTRAST TECHNIQUE: Multiplanar, multiecho pulse sequences of the brain and surrounding structures were obtained without intravenous contrast. COMPARISON:  06/26/2021 MRI, correlation is also made with the 11/18/2021 CT head FINDINGS: Brain: No restricted diffusion to suggest acute or subacute infarct. No acute hemorrhage, mass, mass effect, or midline shift. Encephalomalacia in the left MCA distribution, consistent with expected evolution of the previously noted infarct. These  areas are associated with hemosiderin deposition from remote petechial hemorrhage. No hydrocephalus or extra-axial collection. Ex vacuo dilatation of the atrium and temporal horn of the left lateral ventricle. Vascular: Normal arterial flow voids. Skull and upper cervical spine: Normal marrow signal. Sinuses/Orbits: Clear paranasal sinuses. The orbits are unremarkable. Other: Fluid in the right mastoid air cells. IMPRESSION: No acute intracranial process. No evidence of acute or subacute infarct. Electronically Signed   By: Merilyn Baba M.D.   On: 11/18/2021 23:34   CT Head Wo Contrast  Result Date: 11/18/2021 CLINICAL DATA:  Altered mental status. EXAM: CT HEAD WITHOUT CONTRAST TECHNIQUE: Contiguous axial images were obtained from the base of the skull through the vertex without intravenous contrast. RADIATION DOSE REDUCTION: This exam was performed according to the departmental dose-optimization program which includes automated exposure control, adjustment of the mA and/or kV according to patient size and/or use of iterative reconstruction technique. COMPARISON:  None Available. FINDINGS: Brain: No evidence of acute infarction, hemorrhage, hydrocephalus, extra-axial collection or mass lesion/mass effect. A chronic  left parietal lobe infarct is noted. Vascular: No hyperdense vessel or unexpected calcification. Skull: Normal. Negative for fracture or focal lesion. Sinuses/Orbits: No acute finding. Other: None. IMPRESSION: 1. No acute intracranial abnormality. 2. Chronic left parietal lobe infarct. Electronically Signed   By: Virgina Norfolk M.D.   On: 11/18/2021 21:06   DG Chest Port 1 View  Result Date: 11/18/2021 CLINICAL DATA:  Altered mental status. EXAM: PORTABLE CHEST 1 VIEW COMPARISON:  October 15, 2021 FINDINGS: Multiple sternal wires are noted. The heart size and mediastinal contours are within normal limits. An artificial mitral valve is seen. Both lungs are clear. The visualized skeletal structures are unremarkable. IMPRESSION: No active disease. Electronically Signed   By: Virgina Norfolk M.D.   On: 11/18/2021 21:03        Scheduled Meds:  iron polysaccharides  150 mg Oral BID   potassium chloride  40 mEq Oral Q4H   Continuous Infusions:   LOS: 1 day    Time spent: 60mins    Kathie Dike, MD [facility]   If 7PM-7AM, please contact night-coverage [email or web address]  11/20/2021, 6:12 PM

## 2021-11-21 LAB — BASIC METABOLIC PANEL
Anion gap: 6 (ref 5–15)
BUN: 7 mg/dL (ref 6–20)
CO2: 22 mmol/L (ref 22–32)
Calcium: 8.4 mg/dL — ABNORMAL LOW (ref 8.9–10.3)
Chloride: 110 mmol/L (ref 98–111)
Creatinine, Ser: 0.81 mg/dL (ref 0.44–1.00)
GFR, Estimated: >60 mL/min (ref >60)
Glucose, Bld: 108 mg/dL — ABNORMAL HIGH (ref 70–99)
Potassium: 4.4 mmol/L (ref 3.5–5.1)
Sodium: 138 mmol/L (ref 135–145)

## 2021-11-21 LAB — URINE CULTURE: Culture: 80000 — AB

## 2021-11-21 LAB — MAGNESIUM: Magnesium: 2.2 mg/dL (ref 1.7–2.4)

## 2021-11-21 MED ORDER — CIPROFLOXACIN HCL 250 MG PO TABS
250.0000 mg | ORAL_TABLET | Freq: Two times a day (BID) | ORAL | Status: DC
  Administered 2021-11-21: 250 mg via ORAL
  Filled 2021-11-21 (×2): qty 1

## 2021-11-21 MED ORDER — LEVOFLOXACIN 500 MG PO TABS
500.0000 mg | ORAL_TABLET | Freq: Every day | ORAL | Status: DC
  Filled 2021-11-21: qty 1

## 2021-11-21 MED ORDER — CIPROFLOXACIN HCL 250 MG PO TABS: 250.0000 mg | ORAL_TABLET | Freq: Two times a day (BID) | ORAL | 0 refills | Status: AC

## 2021-11-21 NOTE — Discharge Summary (Signed)
Physician Discharge Summary  Debra Klein NAT:[id number] DOB: 1976/01/04 DOA: 11/18/2021  PCP: Gaynelle Arabian, MD  Admit date: 11/18/2021 Discharge date: 11/21/2021  Admitted From: home Disposition:  home  Recommendations for Outpatient Follow-up:  Follow up with PCP in 1-2 weeks Please obtain BMP/CBC in one week   Discharge Condition: Stable CODE STATUS: Full code Diet recommendation: Heart healthy  Brief/Interim Summary: 46 year old female with history of schizophrenia on psychotropic medications, admitted to the hospital with worsening mental status.  Urine culture positive for Pseudomonas, started on intravenous antibiotics.   Discharge Diagnoses:  Principal Problem:   Acute encephalopathy Active Problems:   HLD (hyperlipidemia)   Chronic iron deficiency anemia   Schizophrenia (HCC)   Lactic acidosis   Hypokalemia   Hypomagnesemia   Acute cystitis   QT prolongation   Acute metabolic encephalopathy -She is being treated for UTI, urine culture positive for Pseudomonas -MRI brain did not show any acute findings -Further metabolic work-up with ammonia, B12, ABG and TSH unrevealing -By treating her UTI, overall mental status has improved back to baseline   Hypokalemia -Replaced   Hypomagnesemia -Replaced   Pseudomonas UTI -Started on ceftriaxone -Since urine culture positive for Pseudomonas, antibiotics initially changed to cefepime -QTc was rechecked and noted to have improved.  She was transitioned to ciprofloxacin for discharge   Mild lactic acidosis -Suspect is related to dehydration -Improving with IV fluids   Chronic iron deficiency anemia -Hemoglobin near baseline, continue to monitor   QT prolongation -Replace magnesium and potassium -Follow-up QTc improved  Discharge Instructions  Discharge Instructions     Diet - low sodium heart healthy   Complete by: As directed    Discharge wound care:   Complete by: As directed    Paint right heel  with betadine daily; allow to air dry. Cover with dry dressing.   Increase activity slowly   Complete by: As directed       Allergies as of 11/21/2021   No Known Allergies      Medication List     TAKE these medications    acetaminophen 500 MG tablet Commonly known as: TYLENOL Take 1,000 mg by mouth every 6 (six) hours as needed for mild pain.   aspirin 81 MG chewable tablet Chew 1 tablet (81 mg total) by mouth daily.   atorvastatin 20 MG tablet Commonly known as: LIPITOR Take 20 mg by mouth daily.   benztropine 0.5 MG tablet Commonly known as: COGENTIN Take 0.5 mg by mouth at bedtime.   ciprofloxacin 250 MG tablet Commonly known as: CIPRO Take 1 tablet (250 mg total) by mouth 2 (two) times daily for 3 days.   cloZAPine 100 MG tablet Commonly known as: CLOZARIL Take 300 mg by mouth at bedtime. What changed: Another medication with the same name was removed. Continue taking this medication, and follow the directions you see here.   cyanocobalamin 1000 MCG tablet Take 1 tablet (1,000 mcg total) by mouth daily.   fluPHENAZine 2.5 MG tablet Commonly known as: PROLIXIN Take 2.5 mg by mouth at bedtime. What changed: Another medication with the same name was removed. Continue taking this medication, and follow the directions you see here.   iron polysaccharides 150 MG capsule Commonly known as: NIFEREX Take 150 mg by mouth 2 (two) times daily.               Discharge Care Instructions  (From admission, onward)           Start  Ordered   11/21/21 0000  Discharge wound care:       Comments: Paint right heel with betadine daily; allow to air dry. Cover with dry dressing.   11/21/21 1249            No Known Allergies  Consultations:    Procedures/Studies: MR BRAIN WO CONTRAST  Result Date: 11/18/2021 CLINICAL DATA:  Altered mental status, stroke suspected EXAM: MRI HEAD WITHOUT CONTRAST TECHNIQUE: Multiplanar, multiecho pulse sequences of  the brain and surrounding structures were obtained without intravenous contrast. COMPARISON:  06/26/2021 MRI, correlation is also made with the 11/18/2021 CT head FINDINGS: Brain: No restricted diffusion to suggest acute or subacute infarct. No acute hemorrhage, mass, mass effect, or midline shift. Encephalomalacia in the left MCA distribution, consistent with expected evolution of the previously noted infarct. These areas are associated with hemosiderin deposition from remote petechial hemorrhage. No hydrocephalus or extra-axial collection. Ex vacuo dilatation of the atrium and temporal horn of the left lateral ventricle. Vascular: Normal arterial flow voids. Skull and upper cervical spine: Normal marrow signal. Sinuses/Orbits: Clear paranasal sinuses. The orbits are unremarkable. Other: Fluid in the right mastoid air cells. IMPRESSION: No acute intracranial process. No evidence of acute or subacute infarct. Electronically Signed   By: Merilyn Baba M.D.   On: 11/18/2021 23:34   CT Head Wo Contrast  Result Date: 11/18/2021 CLINICAL DATA:  Altered mental status. EXAM: CT HEAD WITHOUT CONTRAST TECHNIQUE: Contiguous axial images were obtained from the base of the skull through the vertex without intravenous contrast. RADIATION DOSE REDUCTION: This exam was performed according to the departmental dose-optimization program which includes automated exposure control, adjustment of the mA and/or kV according to patient size and/or use of iterative reconstruction technique. COMPARISON:  None Available. FINDINGS: Brain: No evidence of acute infarction, hemorrhage, hydrocephalus, extra-axial collection or mass lesion/mass effect. A chronic left parietal lobe infarct is noted. Vascular: No hyperdense vessel or unexpected calcification. Skull: Normal. Negative for fracture or focal lesion. Sinuses/Orbits: No acute finding. Other: None. IMPRESSION: 1. No acute intracranial abnormality. 2. Chronic left parietal lobe infarct.  Electronically Signed   By: Virgina Norfolk M.D.   On: 11/18/2021 21:06   DG Chest Port 1 View  Result Date: 11/18/2021 CLINICAL DATA:  Altered mental status. EXAM: PORTABLE CHEST 1 VIEW COMPARISON:  October 15, 2021 FINDINGS: Multiple sternal wires are noted. The heart size and mediastinal contours are within normal limits. An artificial mitral valve is seen. Both lungs are clear. The visualized skeletal structures are unremarkable. IMPRESSION: No active disease. Electronically Signed   By: Virgina Norfolk M.D.   On: 11/18/2021 21:03      Subjective: Patient more awake and alert.  Family feels that she is back to baseline.  Discharge Exam: Vitals:   11/21/21 0500 11/21/21 0546 11/21/21 1100 11/21/21 1158  BP:  127/85  (!) 145/97  Pulse:  91  87  Resp:  16 20 18   Temp:  98.7 F (37.1 C)  98.2 F (36.8 C)  TempSrc:  Oral    SpO2:  100%  100%  Weight: 71.1 kg       General: Pt is alert, awake, not in acute distress Cardiovascular: RRR, S1/S2 +, no rubs, no gallops Respiratory: CTA bilaterally, no wheezing, no rhonchi Abdominal: Soft, NT, ND, bowel sounds + Extremities: no edema, no cyanosis    The results of significant diagnostics from this hospitalization (including imaging, microbiology, ancillary and laboratory) are listed below for reference.     Microbiology:  Recent Results (from the past 240 hour(s))  Blood culture (routine x 2)     Status: None (Preliminary result)   Collection Time: 11/18/21  8:49 PM   Specimen: BLOOD RIGHT HAND  Result Value Ref Range Status   Specimen Description BLOOD RIGHT HAND  Final   Special Requests   Final    BOTTLES DRAWN AEROBIC ONLY Blood Culture adequate volume   Culture   Final    NO GROWTH 3 DAYS Performed at [facility], [address], [place], [place] [address]    Report Status PENDING  Incomplete  Urine Culture     Status: Abnormal   Collection Time: 11/18/21  9:23 PM   Specimen: In/Out Cath Urine  Result Value  Ref Range Status   Specimen Description IN/OUT CATH URINE  Final   Special Requests   Final    NONE Performed at [facility], [place] [address] [place], [place] [address]    Culture 80,000 COLONIES/mL PSEUDOMONAS AERUGINOSA (A)  Final   Report Status 11/21/2021 FINAL  Final   Organism ID, Bacteria PSEUDOMONAS AERUGINOSA (A)  Final      Susceptibility   Pseudomonas aeruginosa - MIC*    CEFTAZIDIME 8 SENSITIVE Sensitive     CIPROFLOXACIN 0.5 SENSITIVE Sensitive     GENTAMICIN <=1 SENSITIVE Sensitive     IMIPENEM 1 SENSITIVE Sensitive     PIP/TAZO 16 SENSITIVE Sensitive     * 80,000 COLONIES/mL PSEUDOMONAS AERUGINOSA     Labs: BNP (last 3 results) Recent Labs    10/15/21 1531  BNP 09.8   Basic Metabolic Panel: Recent Labs  Lab 11/18/21 2028 11/18/21 2042 11/19/21 0045 11/19/21 1512 11/20/21 0702 11/21/21 0350  NA 142 142 142 143 140 138  K 3.3* 3.3* 3.6 3.6 3.4* 4.4  CL 107 106 111  --  109 110  CO2 22  --  21*  --  23 22  GLUCOSE 141* 139* 143*  --  93 108*  BUN 9 8 7   --  <5* 7  CREATININE 0.92 0.90 0.80  --  0.79 0.81  CALCIUM 9.4  --  8.6*  --  9.0 8.4*  MG 1.6*  --  1.8  --   --  2.2   Liver Function Tests: Recent Labs  Lab 11/18/21 2028 11/19/21 0045  AST 16 13*  ALT 8 8  ALKPHOS 42 39  BILITOT 1.1 0.9  PROT 6.3* 5.8*  ALBUMIN 3.3* 3.0*   No results for input(s): "LIPASE", "AMYLASE" in the last 168 hours. Recent Labs  Lab 11/19/21 1516  AMMONIA <10   CBC: Recent Labs  Lab 11/18/21 2028 11/18/21 2042 11/19/21 0045 11/19/21 1512 11/20/21 0702  WBC 7.5  --  8.0  --  7.7  NEUTROABS  --   --  6.7  --   --   HGB 8.9* 9.5* 8.8* 10.2* 9.0*  HCT 28.8* 28.0* 26.4* 30.0* 28.7*  MCV 95.4  --  93.0  --  93.8  PLT 250  --  222  --  280   Cardiac Enzymes: Recent Labs  Lab 11/19/21 1500  CKTOTAL 42   BNP: Invalid input(s): "POCBNP" CBG: Recent Labs  Lab 11/18/21 2045  GLUCAP 133*   D-Dimer No results for input(s): "DDIMER" in the  last 72 hours. Hgb A1c No results for input(s): "HGBA1C" in the last 72 hours. Lipid Profile No results for input(s): "CHOL", "HDL", "LDLCALC", "TRIG", "CHOLHDL", "LDLDIRECT" in the last 72 hours. Thyroid function  studies Recent Labs    11/19/21 1500  TSH 0.611   Anemia work up Recent Labs    11/19/21 1516  VITAMINB12 1,151*   Urinalysis    Component Value Date/Time   COLORURINE YELLOW 11/18/2021 2123   APPEARANCEUR HAZY (A) 11/18/2021 2123   LABSPEC 1.019 11/18/2021 2123   PHURINE 6.0 11/18/2021 2123   GLUCOSEU NEGATIVE 11/18/2021 2123   HGBUR NEGATIVE 11/18/2021 2123   BILIRUBINUR NEGATIVE 11/18/2021 2123   KETONESUR NEGATIVE 11/18/2021 2123   PROTEINUR NEGATIVE 11/18/2021 2123   UROBILINOGEN 0.2 06/25/2007 2223   NITRITE NEGATIVE 11/18/2021 2123   LEUKOCYTESUR TRACE (A) 11/18/2021 2123   Sepsis Labs Recent Labs  Lab 11/18/21 2028 11/19/21 0045 11/20/21 0702  WBC 7.5 8.0 7.7   Microbiology Recent Results (from the past 240 hour(s))  Blood culture (routine x 2)     Status: None (Preliminary result)   Collection Time: 11/18/21  8:49 PM   Specimen: BLOOD RIGHT HAND  Result Value Ref Range Status   Specimen Description BLOOD RIGHT HAND  Final   Special Requests   Final    BOTTLES DRAWN AEROBIC ONLY Blood Culture adequate volume   Culture   Final    NO GROWTH 3 DAYS Performed at [facility], [address] [place], [place] [address]    Report Status PENDING  Incomplete  Urine Culture     Status: Abnormal   Collection Time: 11/18/21  9:23 PM   Specimen: In/Out Cath Urine  Result Value Ref Range Status   Specimen Description IN/OUT CATH URINE  Final   Special Requests   Final    NONE Performed at [facility], [place] [address], [place], [place] [address]    Culture 80,000 COLONIES/mL PSEUDOMONAS AERUGINOSA (A)  Final   Report Status 11/21/2021 FINAL  Final   Organism ID, Bacteria PSEUDOMONAS AERUGINOSA (A)  Final      Susceptibility    Pseudomonas aeruginosa - MIC*    CEFTAZIDIME 8 SENSITIVE Sensitive     CIPROFLOXACIN 0.5 SENSITIVE Sensitive     GENTAMICIN <=1 SENSITIVE Sensitive     IMIPENEM 1 SENSITIVE Sensitive     PIP/TAZO 16 SENSITIVE Sensitive     * 80,000 COLONIES/mL PSEUDOMONAS AERUGINOSA     Time coordinating discharge: 59mins  SIGNED:   Kathie Dike, MD  [facility] 11/21/2021, 10:16 PM   If 7PM-7AM, please contact night-coverage [email or web address]

## 2021-11-21 NOTE — TOC Transition Note (Signed)
Transition of Care [facility]) - CM/SW Discharge Note   Patient Details  Name: Portland Sarinana MRN: [id number] Date of Birth: 12-12-1975  [facility]) CM/SW Contact:  Pollie Friar, RN Phone Number: 11/21/2021, 1:37 PM   Clinical Narrative:    Pt is discharging home with self care. No needs per TOC.    Final next level of care: Home/Self Care Barriers to Discharge: No Barriers Identified   Patient Goals and CMS Choice        Discharge Placement                       Discharge Plan and Services   Discharge Planning Services: CM Consult                                 Social Determinants of Health (SDOH) Interventions     Readmission Risk Interventions     No data to display

## 2021-11-21 NOTE — Plan of Care (Signed)
Education and careplans reviewed with patient's mom which is her caretaker.  Problem: Education: Goal: Knowledge of General Education information will improve Description: Including pain rating scale, medication(s)/side effects and non-pharmacologic comfort measures Outcome: Adequate for Discharge   Problem: Health Behavior/Discharge Planning: Goal: Ability to manage health-related needs will improve Outcome: Adequate for Discharge   Problem: Clinical Measurements: Goal: Ability to maintain clinical measurements within normal limits will improve Outcome: Adequate for Discharge Goal: Will remain free from infection Outcome: Adequate for Discharge Goal: Diagnostic test results will improve Outcome: Adequate for Discharge Goal: Respiratory complications will improve Outcome: Adequate for Discharge Goal: Cardiovascular complication will be avoided Outcome: Adequate for Discharge

## 2021-11-23 LAB — CULTURE, BLOOD (ROUTINE X 2)
Culture: NO GROWTH
Special Requests: ADEQUATE

## 2021-11-24 ENCOUNTER — Other Ambulatory Visit (HOSPITAL_COMMUNITY): Payer: Medicare Other

## 2021-11-24 DIAGNOSIS — Z8673 Personal history of transient ischemic attack (TIA), and cerebral infarction without residual deficits: Secondary | ICD-10-CM | POA: Diagnosis not present

## 2021-11-24 DIAGNOSIS — Z23 Encounter for immunization: Secondary | ICD-10-CM | POA: Diagnosis not present

## 2021-11-24 DIAGNOSIS — E7800 Pure hypercholesterolemia, unspecified: Secondary | ICD-10-CM | POA: Diagnosis not present

## 2021-11-24 DIAGNOSIS — Z0000 Encounter for general adult medical examination without abnormal findings: Secondary | ICD-10-CM | POA: Diagnosis not present

## 2021-11-24 DIAGNOSIS — R7303 Prediabetes: Secondary | ICD-10-CM | POA: Diagnosis not present

## 2021-11-24 DIAGNOSIS — D509 Iron deficiency anemia, unspecified: Secondary | ICD-10-CM | POA: Diagnosis not present

## 2021-11-24 DIAGNOSIS — M2550 Pain in unspecified joint: Secondary | ICD-10-CM | POA: Diagnosis not present

## 2021-11-24 DIAGNOSIS — I10 Essential (primary) hypertension: Secondary | ICD-10-CM | POA: Diagnosis not present

## 2021-11-25 ENCOUNTER — Ambulatory Visit (HOSPITAL_COMMUNITY): Payer: Medicare Other | Attending: Cardiology

## 2021-11-25 DIAGNOSIS — I059 Rheumatic mitral valve disease, unspecified: Secondary | ICD-10-CM | POA: Diagnosis not present

## 2021-11-25 LAB — ECHOCARDIOGRAM COMPLETE
Area-P 1/2: 2.04 cm2
S' Lateral: 2.2 cm

## 2021-11-27 ENCOUNTER — Telehealth: Payer: Self-pay | Admitting: Cardiology

## 2021-11-27 DIAGNOSIS — M179 Osteoarthritis of knee, unspecified: Secondary | ICD-10-CM | POA: Diagnosis not present

## 2021-11-27 DIAGNOSIS — E876 Hypokalemia: Secondary | ICD-10-CM | POA: Diagnosis not present

## 2021-11-27 DIAGNOSIS — I4891 Unspecified atrial fibrillation: Secondary | ICD-10-CM | POA: Diagnosis not present

## 2021-11-27 DIAGNOSIS — I69320 Aphasia following cerebral infarction: Secondary | ICD-10-CM | POA: Diagnosis not present

## 2021-11-27 DIAGNOSIS — I10 Essential (primary) hypertension: Secondary | ICD-10-CM | POA: Diagnosis not present

## 2021-11-27 DIAGNOSIS — E785 Hyperlipidemia, unspecified: Secondary | ICD-10-CM | POA: Diagnosis not present

## 2021-11-27 DIAGNOSIS — S81811D Laceration without foreign body, right lower leg, subsequent encounter: Secondary | ICD-10-CM | POA: Diagnosis not present

## 2021-11-27 DIAGNOSIS — I69398 Other sequelae of cerebral infarction: Secondary | ICD-10-CM | POA: Diagnosis not present

## 2021-11-27 DIAGNOSIS — K5900 Constipation, unspecified: Secondary | ICD-10-CM | POA: Diagnosis not present

## 2021-11-27 DIAGNOSIS — D649 Anemia, unspecified: Secondary | ICD-10-CM | POA: Diagnosis not present

## 2021-11-27 DIAGNOSIS — Z9181 History of falling: Secondary | ICD-10-CM | POA: Diagnosis not present

## 2021-11-27 DIAGNOSIS — I69351 Hemiplegia and hemiparesis following cerebral infarction affecting right dominant side: Secondary | ICD-10-CM | POA: Diagnosis not present

## 2021-11-27 DIAGNOSIS — S82402D Unspecified fracture of shaft of left fibula, subsequent encounter for closed fracture with routine healing: Secondary | ICD-10-CM | POA: Diagnosis not present

## 2021-11-27 DIAGNOSIS — N179 Acute kidney failure, unspecified: Secondary | ICD-10-CM | POA: Diagnosis not present

## 2021-11-27 DIAGNOSIS — Z992 Dependence on renal dialysis: Secondary | ICD-10-CM | POA: Diagnosis not present

## 2021-11-27 DIAGNOSIS — I69391 Dysphagia following cerebral infarction: Secondary | ICD-10-CM | POA: Diagnosis not present

## 2021-11-27 NOTE — Telephone Encounter (Signed)
   Pre-operative Risk Assessment    Patient Name: Debra Klein  DOB: 02-22-1975 MRN: [id number]      Request for Surgical Clearance    Procedure:   Dental Cleaning   Date of Surgery:  Clearance 12/18/21                                 Surgeon:  Dr. Johnnette Litter  Surgeon's Group or Practice Name:  Dr. Gabriel Earing DDS [facility] Phone number:  [phone] Fax number:  [phone]   Type of Clearance Requested:   Medical    Type of Anesthesia:  None    Additional requests/questions:   DENTIST OFFICE NEEDS TO KNOW IF PT NEEDS TO PRE MEDICATE   Signed, Foye Clock   11/27/2021, 12:48 PM

## 2021-11-27 NOTE — Telephone Encounter (Signed)
   Patient Name: Debra Klein  DOB: Sep 27, 1975 MRN: [id number]  Primary Cardiologist: [name], MD  Chart reviewed as part of pre-operative protocol coverage.   SIMPLE EXTRACTION/CLEANINGS: Simple dental extractions (i.e. 1-2 teeth) are considered low risk procedures per guidelines and generally do not require any specific cardiac clearance. It is also generally accepted that for simple extractions and dental cleanings, there is no need to interrupt blood thinner therapy.   SBE prophylaxis IS required for the patient from a cardiac standpoint.  I will route this recommendation to the requesting party via Epic fax function and remove from pre-op pool.  Please call with questions.  Lenna Sciara, NP 11/27/2021, 3:37 PM

## 2021-12-01 DIAGNOSIS — S81811D Laceration without foreign body, right lower leg, subsequent encounter: Secondary | ICD-10-CM | POA: Diagnosis not present

## 2021-12-01 DIAGNOSIS — I69391 Dysphagia following cerebral infarction: Secondary | ICD-10-CM | POA: Diagnosis not present

## 2021-12-01 DIAGNOSIS — I4891 Unspecified atrial fibrillation: Secondary | ICD-10-CM | POA: Diagnosis not present

## 2021-12-01 DIAGNOSIS — N179 Acute kidney failure, unspecified: Secondary | ICD-10-CM | POA: Diagnosis not present

## 2021-12-01 DIAGNOSIS — I69320 Aphasia following cerebral infarction: Secondary | ICD-10-CM | POA: Diagnosis not present

## 2021-12-01 DIAGNOSIS — D649 Anemia, unspecified: Secondary | ICD-10-CM | POA: Diagnosis not present

## 2021-12-01 DIAGNOSIS — Z992 Dependence on renal dialysis: Secondary | ICD-10-CM | POA: Diagnosis not present

## 2021-12-01 DIAGNOSIS — M179 Osteoarthritis of knee, unspecified: Secondary | ICD-10-CM | POA: Diagnosis not present

## 2021-12-01 DIAGNOSIS — I69351 Hemiplegia and hemiparesis following cerebral infarction affecting right dominant side: Secondary | ICD-10-CM | POA: Diagnosis not present

## 2021-12-01 DIAGNOSIS — K5900 Constipation, unspecified: Secondary | ICD-10-CM | POA: Diagnosis not present

## 2021-12-01 DIAGNOSIS — I69398 Other sequelae of cerebral infarction: Secondary | ICD-10-CM | POA: Diagnosis not present

## 2021-12-01 DIAGNOSIS — S82402D Unspecified fracture of shaft of left fibula, subsequent encounter for closed fracture with routine healing: Secondary | ICD-10-CM | POA: Diagnosis not present

## 2021-12-01 DIAGNOSIS — Z9181 History of falling: Secondary | ICD-10-CM | POA: Diagnosis not present

## 2021-12-01 DIAGNOSIS — E876 Hypokalemia: Secondary | ICD-10-CM | POA: Diagnosis not present

## 2021-12-01 DIAGNOSIS — E785 Hyperlipidemia, unspecified: Secondary | ICD-10-CM | POA: Diagnosis not present

## 2021-12-01 DIAGNOSIS — I10 Essential (primary) hypertension: Secondary | ICD-10-CM | POA: Diagnosis not present

## 2021-12-02 DIAGNOSIS — M179 Osteoarthritis of knee, unspecified: Secondary | ICD-10-CM | POA: Diagnosis not present

## 2021-12-02 DIAGNOSIS — N179 Acute kidney failure, unspecified: Secondary | ICD-10-CM | POA: Diagnosis not present

## 2021-12-02 DIAGNOSIS — I4891 Unspecified atrial fibrillation: Secondary | ICD-10-CM | POA: Diagnosis not present

## 2021-12-02 DIAGNOSIS — I10 Essential (primary) hypertension: Secondary | ICD-10-CM | POA: Diagnosis not present

## 2021-12-02 DIAGNOSIS — D649 Anemia, unspecified: Secondary | ICD-10-CM | POA: Diagnosis not present

## 2021-12-02 DIAGNOSIS — I69391 Dysphagia following cerebral infarction: Secondary | ICD-10-CM | POA: Diagnosis not present

## 2021-12-02 DIAGNOSIS — E785 Hyperlipidemia, unspecified: Secondary | ICD-10-CM | POA: Diagnosis not present

## 2021-12-02 DIAGNOSIS — S81811D Laceration without foreign body, right lower leg, subsequent encounter: Secondary | ICD-10-CM | POA: Diagnosis not present

## 2021-12-02 DIAGNOSIS — K5900 Constipation, unspecified: Secondary | ICD-10-CM | POA: Diagnosis not present

## 2021-12-02 DIAGNOSIS — I69398 Other sequelae of cerebral infarction: Secondary | ICD-10-CM | POA: Diagnosis not present

## 2021-12-02 DIAGNOSIS — I69320 Aphasia following cerebral infarction: Secondary | ICD-10-CM | POA: Diagnosis not present

## 2021-12-02 DIAGNOSIS — S82402D Unspecified fracture of shaft of left fibula, subsequent encounter for closed fracture with routine healing: Secondary | ICD-10-CM | POA: Diagnosis not present

## 2021-12-02 DIAGNOSIS — E876 Hypokalemia: Secondary | ICD-10-CM | POA: Diagnosis not present

## 2021-12-02 DIAGNOSIS — I69351 Hemiplegia and hemiparesis following cerebral infarction affecting right dominant side: Secondary | ICD-10-CM | POA: Diagnosis not present

## 2021-12-02 DIAGNOSIS — Z9181 History of falling: Secondary | ICD-10-CM | POA: Diagnosis not present

## 2021-12-02 DIAGNOSIS — Z992 Dependence on renal dialysis: Secondary | ICD-10-CM | POA: Diagnosis not present

## 2021-12-03 DIAGNOSIS — M179 Osteoarthritis of knee, unspecified: Secondary | ICD-10-CM | POA: Diagnosis not present

## 2021-12-03 DIAGNOSIS — S81811D Laceration without foreign body, right lower leg, subsequent encounter: Secondary | ICD-10-CM | POA: Diagnosis not present

## 2021-12-03 DIAGNOSIS — I69351 Hemiplegia and hemiparesis following cerebral infarction affecting right dominant side: Secondary | ICD-10-CM | POA: Diagnosis not present

## 2021-12-03 DIAGNOSIS — Z9181 History of falling: Secondary | ICD-10-CM | POA: Diagnosis not present

## 2021-12-03 DIAGNOSIS — N179 Acute kidney failure, unspecified: Secondary | ICD-10-CM | POA: Diagnosis not present

## 2021-12-03 DIAGNOSIS — Z992 Dependence on renal dialysis: Secondary | ICD-10-CM | POA: Diagnosis not present

## 2021-12-03 DIAGNOSIS — E785 Hyperlipidemia, unspecified: Secondary | ICD-10-CM | POA: Diagnosis not present

## 2021-12-03 DIAGNOSIS — K5900 Constipation, unspecified: Secondary | ICD-10-CM | POA: Diagnosis not present

## 2021-12-03 DIAGNOSIS — E876 Hypokalemia: Secondary | ICD-10-CM | POA: Diagnosis not present

## 2021-12-03 DIAGNOSIS — I4891 Unspecified atrial fibrillation: Secondary | ICD-10-CM | POA: Diagnosis not present

## 2021-12-03 DIAGNOSIS — I69320 Aphasia following cerebral infarction: Secondary | ICD-10-CM | POA: Diagnosis not present

## 2021-12-03 DIAGNOSIS — D649 Anemia, unspecified: Secondary | ICD-10-CM | POA: Diagnosis not present

## 2021-12-03 DIAGNOSIS — S82402D Unspecified fracture of shaft of left fibula, subsequent encounter for closed fracture with routine healing: Secondary | ICD-10-CM | POA: Diagnosis not present

## 2021-12-03 DIAGNOSIS — I10 Essential (primary) hypertension: Secondary | ICD-10-CM | POA: Diagnosis not present

## 2021-12-03 DIAGNOSIS — I69398 Other sequelae of cerebral infarction: Secondary | ICD-10-CM | POA: Diagnosis not present

## 2021-12-03 DIAGNOSIS — I69391 Dysphagia following cerebral infarction: Secondary | ICD-10-CM | POA: Diagnosis not present

## 2021-12-06 DIAGNOSIS — S81801A Unspecified open wound, right lower leg, initial encounter: Secondary | ICD-10-CM | POA: Diagnosis not present

## 2021-12-08 DIAGNOSIS — S81811D Laceration without foreign body, right lower leg, subsequent encounter: Secondary | ICD-10-CM | POA: Diagnosis not present

## 2021-12-08 DIAGNOSIS — Z992 Dependence on renal dialysis: Secondary | ICD-10-CM | POA: Diagnosis not present

## 2021-12-08 DIAGNOSIS — M179 Osteoarthritis of knee, unspecified: Secondary | ICD-10-CM | POA: Diagnosis not present

## 2021-12-08 DIAGNOSIS — I10 Essential (primary) hypertension: Secondary | ICD-10-CM | POA: Diagnosis not present

## 2021-12-08 DIAGNOSIS — I69351 Hemiplegia and hemiparesis following cerebral infarction affecting right dominant side: Secondary | ICD-10-CM | POA: Diagnosis not present

## 2021-12-08 DIAGNOSIS — E785 Hyperlipidemia, unspecified: Secondary | ICD-10-CM | POA: Diagnosis not present

## 2021-12-08 DIAGNOSIS — N179 Acute kidney failure, unspecified: Secondary | ICD-10-CM | POA: Diagnosis not present

## 2021-12-08 DIAGNOSIS — I69320 Aphasia following cerebral infarction: Secondary | ICD-10-CM | POA: Diagnosis not present

## 2021-12-08 DIAGNOSIS — E876 Hypokalemia: Secondary | ICD-10-CM | POA: Diagnosis not present

## 2021-12-08 DIAGNOSIS — K5900 Constipation, unspecified: Secondary | ICD-10-CM | POA: Diagnosis not present

## 2021-12-08 DIAGNOSIS — D649 Anemia, unspecified: Secondary | ICD-10-CM | POA: Diagnosis not present

## 2021-12-08 DIAGNOSIS — I69391 Dysphagia following cerebral infarction: Secondary | ICD-10-CM | POA: Diagnosis not present

## 2021-12-08 DIAGNOSIS — I69398 Other sequelae of cerebral infarction: Secondary | ICD-10-CM | POA: Diagnosis not present

## 2021-12-08 DIAGNOSIS — Z9181 History of falling: Secondary | ICD-10-CM | POA: Diagnosis not present

## 2021-12-08 DIAGNOSIS — I4891 Unspecified atrial fibrillation: Secondary | ICD-10-CM | POA: Diagnosis not present

## 2021-12-08 DIAGNOSIS — S82402D Unspecified fracture of shaft of left fibula, subsequent encounter for closed fracture with routine healing: Secondary | ICD-10-CM | POA: Diagnosis not present

## 2021-12-09 DIAGNOSIS — M00062 Staphylococcal arthritis, left knee: Secondary | ICD-10-CM | POA: Diagnosis not present

## 2021-12-09 DIAGNOSIS — S82402D Unspecified fracture of shaft of left fibula, subsequent encounter for closed fracture with routine healing: Secondary | ICD-10-CM | POA: Diagnosis not present

## 2021-12-09 DIAGNOSIS — I4891 Unspecified atrial fibrillation: Secondary | ICD-10-CM | POA: Diagnosis not present

## 2021-12-09 DIAGNOSIS — D649 Anemia, unspecified: Secondary | ICD-10-CM | POA: Diagnosis not present

## 2021-12-09 DIAGNOSIS — I69320 Aphasia following cerebral infarction: Secondary | ICD-10-CM | POA: Diagnosis not present

## 2021-12-09 DIAGNOSIS — S81811D Laceration without foreign body, right lower leg, subsequent encounter: Secondary | ICD-10-CM | POA: Diagnosis not present

## 2021-12-09 DIAGNOSIS — I63412 Cerebral infarction due to embolism of left middle cerebral artery: Secondary | ICD-10-CM | POA: Diagnosis not present

## 2021-12-09 DIAGNOSIS — R269 Unspecified abnormalities of gait and mobility: Secondary | ICD-10-CM | POA: Diagnosis not present

## 2021-12-09 DIAGNOSIS — I10 Essential (primary) hypertension: Secondary | ICD-10-CM | POA: Diagnosis not present

## 2021-12-09 DIAGNOSIS — E876 Hypokalemia: Secondary | ICD-10-CM | POA: Diagnosis not present

## 2021-12-09 DIAGNOSIS — Z9181 History of falling: Secondary | ICD-10-CM | POA: Diagnosis not present

## 2021-12-09 DIAGNOSIS — M179 Osteoarthritis of knee, unspecified: Secondary | ICD-10-CM | POA: Diagnosis not present

## 2021-12-09 DIAGNOSIS — I69351 Hemiplegia and hemiparesis following cerebral infarction affecting right dominant side: Secondary | ICD-10-CM | POA: Diagnosis not present

## 2021-12-09 DIAGNOSIS — E785 Hyperlipidemia, unspecified: Secondary | ICD-10-CM | POA: Diagnosis not present

## 2021-12-09 DIAGNOSIS — I69398 Other sequelae of cerebral infarction: Secondary | ICD-10-CM | POA: Diagnosis not present

## 2021-12-09 DIAGNOSIS — Z992 Dependence on renal dialysis: Secondary | ICD-10-CM | POA: Diagnosis not present

## 2021-12-09 DIAGNOSIS — N179 Acute kidney failure, unspecified: Secondary | ICD-10-CM | POA: Diagnosis not present

## 2021-12-09 DIAGNOSIS — K5900 Constipation, unspecified: Secondary | ICD-10-CM | POA: Diagnosis not present

## 2021-12-09 DIAGNOSIS — I69391 Dysphagia following cerebral infarction: Secondary | ICD-10-CM | POA: Diagnosis not present

## 2021-12-10 DIAGNOSIS — I4891 Unspecified atrial fibrillation: Secondary | ICD-10-CM | POA: Diagnosis not present

## 2021-12-10 DIAGNOSIS — I69320 Aphasia following cerebral infarction: Secondary | ICD-10-CM | POA: Diagnosis not present

## 2021-12-10 DIAGNOSIS — S81811D Laceration without foreign body, right lower leg, subsequent encounter: Secondary | ICD-10-CM | POA: Diagnosis not present

## 2021-12-10 DIAGNOSIS — E785 Hyperlipidemia, unspecified: Secondary | ICD-10-CM | POA: Diagnosis not present

## 2021-12-10 DIAGNOSIS — N179 Acute kidney failure, unspecified: Secondary | ICD-10-CM | POA: Diagnosis not present

## 2021-12-10 DIAGNOSIS — E876 Hypokalemia: Secondary | ICD-10-CM | POA: Diagnosis not present

## 2021-12-10 DIAGNOSIS — I69398 Other sequelae of cerebral infarction: Secondary | ICD-10-CM | POA: Diagnosis not present

## 2021-12-10 DIAGNOSIS — I69351 Hemiplegia and hemiparesis following cerebral infarction affecting right dominant side: Secondary | ICD-10-CM | POA: Diagnosis not present

## 2021-12-10 DIAGNOSIS — D649 Anemia, unspecified: Secondary | ICD-10-CM | POA: Diagnosis not present

## 2021-12-10 DIAGNOSIS — K5900 Constipation, unspecified: Secondary | ICD-10-CM | POA: Diagnosis not present

## 2021-12-10 DIAGNOSIS — I69391 Dysphagia following cerebral infarction: Secondary | ICD-10-CM | POA: Diagnosis not present

## 2021-12-10 DIAGNOSIS — M179 Osteoarthritis of knee, unspecified: Secondary | ICD-10-CM | POA: Diagnosis not present

## 2021-12-10 DIAGNOSIS — Z992 Dependence on renal dialysis: Secondary | ICD-10-CM | POA: Diagnosis not present

## 2021-12-10 DIAGNOSIS — S82402D Unspecified fracture of shaft of left fibula, subsequent encounter for closed fracture with routine healing: Secondary | ICD-10-CM | POA: Diagnosis not present

## 2021-12-10 DIAGNOSIS — Z9181 History of falling: Secondary | ICD-10-CM | POA: Diagnosis not present

## 2021-12-10 DIAGNOSIS — I10 Essential (primary) hypertension: Secondary | ICD-10-CM | POA: Diagnosis not present

## 2021-12-11 DIAGNOSIS — I4891 Unspecified atrial fibrillation: Secondary | ICD-10-CM | POA: Diagnosis not present

## 2021-12-11 DIAGNOSIS — B965 Pseudomonas (aeruginosa) (mallei) (pseudomallei) as the cause of diseases classified elsewhere: Secondary | ICD-10-CM | POA: Diagnosis not present

## 2021-12-11 DIAGNOSIS — I69351 Hemiplegia and hemiparesis following cerebral infarction affecting right dominant side: Secondary | ICD-10-CM | POA: Diagnosis not present

## 2021-12-11 DIAGNOSIS — M179 Osteoarthritis of knee, unspecified: Secondary | ICD-10-CM | POA: Diagnosis not present

## 2021-12-11 DIAGNOSIS — G9340 Encephalopathy, unspecified: Secondary | ICD-10-CM | POA: Diagnosis not present

## 2021-12-11 DIAGNOSIS — D509 Iron deficiency anemia, unspecified: Secondary | ICD-10-CM | POA: Diagnosis not present

## 2021-12-11 DIAGNOSIS — I69391 Dysphagia following cerebral infarction: Secondary | ICD-10-CM | POA: Diagnosis not present

## 2021-12-11 DIAGNOSIS — I69320 Aphasia following cerebral infarction: Secondary | ICD-10-CM | POA: Diagnosis not present

## 2021-12-11 DIAGNOSIS — N3000 Acute cystitis without hematuria: Secondary | ICD-10-CM | POA: Diagnosis not present

## 2021-12-11 DIAGNOSIS — L89616 Pressure-induced deep tissue damage of right heel: Secondary | ICD-10-CM | POA: Diagnosis not present

## 2021-12-11 DIAGNOSIS — I10 Essential (primary) hypertension: Secondary | ICD-10-CM | POA: Diagnosis not present

## 2021-12-15 DIAGNOSIS — L89616 Pressure-induced deep tissue damage of right heel: Secondary | ICD-10-CM | POA: Diagnosis not present

## 2021-12-15 DIAGNOSIS — E785 Hyperlipidemia, unspecified: Secondary | ICD-10-CM | POA: Diagnosis not present

## 2021-12-15 DIAGNOSIS — I69320 Aphasia following cerebral infarction: Secondary | ICD-10-CM | POA: Diagnosis not present

## 2021-12-15 DIAGNOSIS — B965 Pseudomonas (aeruginosa) (mallei) (pseudomallei) as the cause of diseases classified elsewhere: Secondary | ICD-10-CM | POA: Diagnosis not present

## 2021-12-15 DIAGNOSIS — I69391 Dysphagia following cerebral infarction: Secondary | ICD-10-CM | POA: Diagnosis not present

## 2021-12-15 DIAGNOSIS — I10 Essential (primary) hypertension: Secondary | ICD-10-CM | POA: Diagnosis not present

## 2021-12-15 DIAGNOSIS — K5900 Constipation, unspecified: Secondary | ICD-10-CM | POA: Diagnosis not present

## 2021-12-15 DIAGNOSIS — Z7982 Long term (current) use of aspirin: Secondary | ICD-10-CM | POA: Diagnosis not present

## 2021-12-15 DIAGNOSIS — G9340 Encephalopathy, unspecified: Secondary | ICD-10-CM | POA: Diagnosis not present

## 2021-12-15 DIAGNOSIS — E8729 Other acidosis: Secondary | ICD-10-CM | POA: Diagnosis not present

## 2021-12-15 DIAGNOSIS — D509 Iron deficiency anemia, unspecified: Secondary | ICD-10-CM | POA: Diagnosis not present

## 2021-12-15 DIAGNOSIS — I69351 Hemiplegia and hemiparesis following cerebral infarction affecting right dominant side: Secondary | ICD-10-CM | POA: Diagnosis not present

## 2021-12-15 DIAGNOSIS — Z8781 Personal history of (healed) traumatic fracture: Secondary | ICD-10-CM | POA: Diagnosis not present

## 2021-12-15 DIAGNOSIS — Z9181 History of falling: Secondary | ICD-10-CM | POA: Diagnosis not present

## 2021-12-15 DIAGNOSIS — Z992 Dependence on renal dialysis: Secondary | ICD-10-CM | POA: Diagnosis not present

## 2021-12-15 DIAGNOSIS — N3000 Acute cystitis without hematuria: Secondary | ICD-10-CM | POA: Diagnosis not present

## 2021-12-15 DIAGNOSIS — M179 Osteoarthritis of knee, unspecified: Secondary | ICD-10-CM | POA: Diagnosis not present

## 2021-12-15 DIAGNOSIS — I4891 Unspecified atrial fibrillation: Secondary | ICD-10-CM | POA: Diagnosis not present

## 2021-12-15 DIAGNOSIS — E876 Hypokalemia: Secondary | ICD-10-CM | POA: Diagnosis not present

## 2021-12-16 DIAGNOSIS — Z9181 History of falling: Secondary | ICD-10-CM | POA: Diagnosis not present

## 2021-12-16 DIAGNOSIS — I69320 Aphasia following cerebral infarction: Secondary | ICD-10-CM | POA: Diagnosis not present

## 2021-12-16 DIAGNOSIS — E785 Hyperlipidemia, unspecified: Secondary | ICD-10-CM | POA: Diagnosis not present

## 2021-12-16 DIAGNOSIS — E8729 Other acidosis: Secondary | ICD-10-CM | POA: Diagnosis not present

## 2021-12-16 DIAGNOSIS — K5900 Constipation, unspecified: Secondary | ICD-10-CM | POA: Diagnosis not present

## 2021-12-16 DIAGNOSIS — B965 Pseudomonas (aeruginosa) (mallei) (pseudomallei) as the cause of diseases classified elsewhere: Secondary | ICD-10-CM | POA: Diagnosis not present

## 2021-12-16 DIAGNOSIS — M179 Osteoarthritis of knee, unspecified: Secondary | ICD-10-CM | POA: Diagnosis not present

## 2021-12-16 DIAGNOSIS — Z992 Dependence on renal dialysis: Secondary | ICD-10-CM | POA: Diagnosis not present

## 2021-12-16 DIAGNOSIS — L89616 Pressure-induced deep tissue damage of right heel: Secondary | ICD-10-CM | POA: Diagnosis not present

## 2021-12-16 DIAGNOSIS — D509 Iron deficiency anemia, unspecified: Secondary | ICD-10-CM | POA: Diagnosis not present

## 2021-12-16 DIAGNOSIS — G9340 Encephalopathy, unspecified: Secondary | ICD-10-CM | POA: Diagnosis not present

## 2021-12-16 DIAGNOSIS — I69391 Dysphagia following cerebral infarction: Secondary | ICD-10-CM | POA: Diagnosis not present

## 2021-12-16 DIAGNOSIS — Z7982 Long term (current) use of aspirin: Secondary | ICD-10-CM | POA: Diagnosis not present

## 2021-12-16 DIAGNOSIS — N3000 Acute cystitis without hematuria: Secondary | ICD-10-CM | POA: Diagnosis not present

## 2021-12-16 DIAGNOSIS — I69351 Hemiplegia and hemiparesis following cerebral infarction affecting right dominant side: Secondary | ICD-10-CM | POA: Diagnosis not present

## 2021-12-16 DIAGNOSIS — I4891 Unspecified atrial fibrillation: Secondary | ICD-10-CM | POA: Diagnosis not present

## 2021-12-16 DIAGNOSIS — I10 Essential (primary) hypertension: Secondary | ICD-10-CM | POA: Diagnosis not present

## 2021-12-16 DIAGNOSIS — Z8781 Personal history of (healed) traumatic fracture: Secondary | ICD-10-CM | POA: Diagnosis not present

## 2021-12-16 DIAGNOSIS — E876 Hypokalemia: Secondary | ICD-10-CM | POA: Diagnosis not present

## 2021-12-17 DIAGNOSIS — D509 Iron deficiency anemia, unspecified: Secondary | ICD-10-CM | POA: Diagnosis not present

## 2021-12-17 DIAGNOSIS — I69320 Aphasia following cerebral infarction: Secondary | ICD-10-CM | POA: Diagnosis not present

## 2021-12-17 DIAGNOSIS — I4891 Unspecified atrial fibrillation: Secondary | ICD-10-CM | POA: Diagnosis not present

## 2021-12-17 DIAGNOSIS — Z7982 Long term (current) use of aspirin: Secondary | ICD-10-CM | POA: Diagnosis not present

## 2021-12-17 DIAGNOSIS — I10 Essential (primary) hypertension: Secondary | ICD-10-CM | POA: Diagnosis not present

## 2021-12-17 DIAGNOSIS — E8729 Other acidosis: Secondary | ICD-10-CM | POA: Diagnosis not present

## 2021-12-17 DIAGNOSIS — G9340 Encephalopathy, unspecified: Secondary | ICD-10-CM | POA: Diagnosis not present

## 2021-12-17 DIAGNOSIS — M179 Osteoarthritis of knee, unspecified: Secondary | ICD-10-CM | POA: Diagnosis not present

## 2021-12-17 DIAGNOSIS — E785 Hyperlipidemia, unspecified: Secondary | ICD-10-CM | POA: Diagnosis not present

## 2021-12-17 DIAGNOSIS — L89616 Pressure-induced deep tissue damage of right heel: Secondary | ICD-10-CM | POA: Diagnosis not present

## 2021-12-17 DIAGNOSIS — N3000 Acute cystitis without hematuria: Secondary | ICD-10-CM | POA: Diagnosis not present

## 2021-12-17 DIAGNOSIS — Z8781 Personal history of (healed) traumatic fracture: Secondary | ICD-10-CM | POA: Diagnosis not present

## 2021-12-17 DIAGNOSIS — I69391 Dysphagia following cerebral infarction: Secondary | ICD-10-CM | POA: Diagnosis not present

## 2021-12-17 DIAGNOSIS — E876 Hypokalemia: Secondary | ICD-10-CM | POA: Diagnosis not present

## 2021-12-17 DIAGNOSIS — K5900 Constipation, unspecified: Secondary | ICD-10-CM | POA: Diagnosis not present

## 2021-12-17 DIAGNOSIS — B965 Pseudomonas (aeruginosa) (mallei) (pseudomallei) as the cause of diseases classified elsewhere: Secondary | ICD-10-CM | POA: Diagnosis not present

## 2021-12-17 DIAGNOSIS — Z9181 History of falling: Secondary | ICD-10-CM | POA: Diagnosis not present

## 2021-12-17 DIAGNOSIS — I69351 Hemiplegia and hemiparesis following cerebral infarction affecting right dominant side: Secondary | ICD-10-CM | POA: Diagnosis not present

## 2021-12-17 DIAGNOSIS — Z992 Dependence on renal dialysis: Secondary | ICD-10-CM | POA: Diagnosis not present

## 2021-12-22 DIAGNOSIS — L89616 Pressure-induced deep tissue damage of right heel: Secondary | ICD-10-CM | POA: Diagnosis not present

## 2021-12-22 DIAGNOSIS — I69320 Aphasia following cerebral infarction: Secondary | ICD-10-CM | POA: Diagnosis not present

## 2021-12-22 DIAGNOSIS — K5900 Constipation, unspecified: Secondary | ICD-10-CM | POA: Diagnosis not present

## 2021-12-22 DIAGNOSIS — Z992 Dependence on renal dialysis: Secondary | ICD-10-CM | POA: Diagnosis not present

## 2021-12-22 DIAGNOSIS — I69351 Hemiplegia and hemiparesis following cerebral infarction affecting right dominant side: Secondary | ICD-10-CM | POA: Diagnosis not present

## 2021-12-22 DIAGNOSIS — N3000 Acute cystitis without hematuria: Secondary | ICD-10-CM | POA: Diagnosis not present

## 2021-12-22 DIAGNOSIS — E8729 Other acidosis: Secondary | ICD-10-CM | POA: Diagnosis not present

## 2021-12-22 DIAGNOSIS — I10 Essential (primary) hypertension: Secondary | ICD-10-CM | POA: Diagnosis not present

## 2021-12-22 DIAGNOSIS — B965 Pseudomonas (aeruginosa) (mallei) (pseudomallei) as the cause of diseases classified elsewhere: Secondary | ICD-10-CM | POA: Diagnosis not present

## 2021-12-22 DIAGNOSIS — D509 Iron deficiency anemia, unspecified: Secondary | ICD-10-CM | POA: Diagnosis not present

## 2021-12-22 DIAGNOSIS — G9340 Encephalopathy, unspecified: Secondary | ICD-10-CM | POA: Diagnosis not present

## 2021-12-22 DIAGNOSIS — Z9181 History of falling: Secondary | ICD-10-CM | POA: Diagnosis not present

## 2021-12-22 DIAGNOSIS — I69391 Dysphagia following cerebral infarction: Secondary | ICD-10-CM | POA: Diagnosis not present

## 2021-12-22 DIAGNOSIS — Z7982 Long term (current) use of aspirin: Secondary | ICD-10-CM | POA: Diagnosis not present

## 2021-12-22 DIAGNOSIS — I4891 Unspecified atrial fibrillation: Secondary | ICD-10-CM | POA: Diagnosis not present

## 2021-12-22 DIAGNOSIS — Z8781 Personal history of (healed) traumatic fracture: Secondary | ICD-10-CM | POA: Diagnosis not present

## 2021-12-22 DIAGNOSIS — S81801A Unspecified open wound, right lower leg, initial encounter: Secondary | ICD-10-CM | POA: Diagnosis not present

## 2021-12-22 DIAGNOSIS — M179 Osteoarthritis of knee, unspecified: Secondary | ICD-10-CM | POA: Diagnosis not present

## 2021-12-22 DIAGNOSIS — E876 Hypokalemia: Secondary | ICD-10-CM | POA: Diagnosis not present

## 2021-12-22 DIAGNOSIS — E785 Hyperlipidemia, unspecified: Secondary | ICD-10-CM | POA: Diagnosis not present

## 2021-12-23 DIAGNOSIS — E785 Hyperlipidemia, unspecified: Secondary | ICD-10-CM | POA: Diagnosis not present

## 2021-12-23 DIAGNOSIS — M179 Osteoarthritis of knee, unspecified: Secondary | ICD-10-CM | POA: Diagnosis not present

## 2021-12-23 DIAGNOSIS — Z992 Dependence on renal dialysis: Secondary | ICD-10-CM | POA: Diagnosis not present

## 2021-12-23 DIAGNOSIS — I69351 Hemiplegia and hemiparesis following cerebral infarction affecting right dominant side: Secondary | ICD-10-CM | POA: Diagnosis not present

## 2021-12-23 DIAGNOSIS — I69391 Dysphagia following cerebral infarction: Secondary | ICD-10-CM | POA: Diagnosis not present

## 2021-12-23 DIAGNOSIS — E876 Hypokalemia: Secondary | ICD-10-CM | POA: Diagnosis not present

## 2021-12-23 DIAGNOSIS — K5900 Constipation, unspecified: Secondary | ICD-10-CM | POA: Diagnosis not present

## 2021-12-23 DIAGNOSIS — D509 Iron deficiency anemia, unspecified: Secondary | ICD-10-CM | POA: Diagnosis not present

## 2021-12-23 DIAGNOSIS — B965 Pseudomonas (aeruginosa) (mallei) (pseudomallei) as the cause of diseases classified elsewhere: Secondary | ICD-10-CM | POA: Diagnosis not present

## 2021-12-23 DIAGNOSIS — E8729 Other acidosis: Secondary | ICD-10-CM | POA: Diagnosis not present

## 2021-12-23 DIAGNOSIS — L89616 Pressure-induced deep tissue damage of right heel: Secondary | ICD-10-CM | POA: Diagnosis not present

## 2021-12-23 DIAGNOSIS — I4891 Unspecified atrial fibrillation: Secondary | ICD-10-CM | POA: Diagnosis not present

## 2021-12-23 DIAGNOSIS — Z9181 History of falling: Secondary | ICD-10-CM | POA: Diagnosis not present

## 2021-12-23 DIAGNOSIS — I69320 Aphasia following cerebral infarction: Secondary | ICD-10-CM | POA: Diagnosis not present

## 2021-12-23 DIAGNOSIS — Z8781 Personal history of (healed) traumatic fracture: Secondary | ICD-10-CM | POA: Diagnosis not present

## 2021-12-23 DIAGNOSIS — Z7982 Long term (current) use of aspirin: Secondary | ICD-10-CM | POA: Diagnosis not present

## 2021-12-23 DIAGNOSIS — G9340 Encephalopathy, unspecified: Secondary | ICD-10-CM | POA: Diagnosis not present

## 2021-12-23 DIAGNOSIS — I10 Essential (primary) hypertension: Secondary | ICD-10-CM | POA: Diagnosis not present

## 2021-12-23 DIAGNOSIS — N3000 Acute cystitis without hematuria: Secondary | ICD-10-CM | POA: Diagnosis not present

## 2021-12-24 DIAGNOSIS — I4891 Unspecified atrial fibrillation: Secondary | ICD-10-CM | POA: Diagnosis not present

## 2021-12-24 DIAGNOSIS — D509 Iron deficiency anemia, unspecified: Secondary | ICD-10-CM | POA: Diagnosis not present

## 2021-12-24 DIAGNOSIS — E876 Hypokalemia: Secondary | ICD-10-CM | POA: Diagnosis not present

## 2021-12-24 DIAGNOSIS — N3000 Acute cystitis without hematuria: Secondary | ICD-10-CM | POA: Diagnosis not present

## 2021-12-24 DIAGNOSIS — L89616 Pressure-induced deep tissue damage of right heel: Secondary | ICD-10-CM | POA: Diagnosis not present

## 2021-12-24 DIAGNOSIS — E8729 Other acidosis: Secondary | ICD-10-CM | POA: Diagnosis not present

## 2021-12-24 DIAGNOSIS — G9340 Encephalopathy, unspecified: Secondary | ICD-10-CM | POA: Diagnosis not present

## 2021-12-24 DIAGNOSIS — M179 Osteoarthritis of knee, unspecified: Secondary | ICD-10-CM | POA: Diagnosis not present

## 2021-12-24 DIAGNOSIS — Z7982 Long term (current) use of aspirin: Secondary | ICD-10-CM | POA: Diagnosis not present

## 2021-12-24 DIAGNOSIS — B965 Pseudomonas (aeruginosa) (mallei) (pseudomallei) as the cause of diseases classified elsewhere: Secondary | ICD-10-CM | POA: Diagnosis not present

## 2021-12-24 DIAGNOSIS — I69320 Aphasia following cerebral infarction: Secondary | ICD-10-CM | POA: Diagnosis not present

## 2021-12-24 DIAGNOSIS — Z992 Dependence on renal dialysis: Secondary | ICD-10-CM | POA: Diagnosis not present

## 2021-12-24 DIAGNOSIS — Z8781 Personal history of (healed) traumatic fracture: Secondary | ICD-10-CM | POA: Diagnosis not present

## 2021-12-24 DIAGNOSIS — Z9181 History of falling: Secondary | ICD-10-CM | POA: Diagnosis not present

## 2021-12-24 DIAGNOSIS — I69351 Hemiplegia and hemiparesis following cerebral infarction affecting right dominant side: Secondary | ICD-10-CM | POA: Diagnosis not present

## 2021-12-24 DIAGNOSIS — E785 Hyperlipidemia, unspecified: Secondary | ICD-10-CM | POA: Diagnosis not present

## 2021-12-24 DIAGNOSIS — I10 Essential (primary) hypertension: Secondary | ICD-10-CM | POA: Diagnosis not present

## 2021-12-24 DIAGNOSIS — I69391 Dysphagia following cerebral infarction: Secondary | ICD-10-CM | POA: Diagnosis not present

## 2021-12-24 DIAGNOSIS — K5900 Constipation, unspecified: Secondary | ICD-10-CM | POA: Diagnosis not present

## 2021-12-25 DIAGNOSIS — D509 Iron deficiency anemia, unspecified: Secondary | ICD-10-CM | POA: Diagnosis not present

## 2021-12-25 DIAGNOSIS — M179 Osteoarthritis of knee, unspecified: Secondary | ICD-10-CM | POA: Diagnosis not present

## 2021-12-25 DIAGNOSIS — L89616 Pressure-induced deep tissue damage of right heel: Secondary | ICD-10-CM | POA: Diagnosis not present

## 2021-12-25 DIAGNOSIS — E785 Hyperlipidemia, unspecified: Secondary | ICD-10-CM | POA: Diagnosis not present

## 2021-12-25 DIAGNOSIS — K5900 Constipation, unspecified: Secondary | ICD-10-CM | POA: Diagnosis not present

## 2021-12-25 DIAGNOSIS — N3000 Acute cystitis without hematuria: Secondary | ICD-10-CM | POA: Diagnosis not present

## 2021-12-25 DIAGNOSIS — E876 Hypokalemia: Secondary | ICD-10-CM | POA: Diagnosis not present

## 2021-12-25 DIAGNOSIS — G9340 Encephalopathy, unspecified: Secondary | ICD-10-CM | POA: Diagnosis not present

## 2021-12-25 DIAGNOSIS — B965 Pseudomonas (aeruginosa) (mallei) (pseudomallei) as the cause of diseases classified elsewhere: Secondary | ICD-10-CM | POA: Diagnosis not present

## 2021-12-25 DIAGNOSIS — Z9181 History of falling: Secondary | ICD-10-CM | POA: Diagnosis not present

## 2021-12-25 DIAGNOSIS — I69391 Dysphagia following cerebral infarction: Secondary | ICD-10-CM | POA: Diagnosis not present

## 2021-12-25 DIAGNOSIS — I10 Essential (primary) hypertension: Secondary | ICD-10-CM | POA: Diagnosis not present

## 2021-12-25 DIAGNOSIS — Z992 Dependence on renal dialysis: Secondary | ICD-10-CM | POA: Diagnosis not present

## 2021-12-25 DIAGNOSIS — Z7982 Long term (current) use of aspirin: Secondary | ICD-10-CM | POA: Diagnosis not present

## 2021-12-25 DIAGNOSIS — I4891 Unspecified atrial fibrillation: Secondary | ICD-10-CM | POA: Diagnosis not present

## 2021-12-25 DIAGNOSIS — E8729 Other acidosis: Secondary | ICD-10-CM | POA: Diagnosis not present

## 2021-12-25 DIAGNOSIS — I69320 Aphasia following cerebral infarction: Secondary | ICD-10-CM | POA: Diagnosis not present

## 2021-12-25 DIAGNOSIS — Z8781 Personal history of (healed) traumatic fracture: Secondary | ICD-10-CM | POA: Diagnosis not present

## 2021-12-25 DIAGNOSIS — I69351 Hemiplegia and hemiparesis following cerebral infarction affecting right dominant side: Secondary | ICD-10-CM | POA: Diagnosis not present

## 2021-12-29 DIAGNOSIS — B965 Pseudomonas (aeruginosa) (mallei) (pseudomallei) as the cause of diseases classified elsewhere: Secondary | ICD-10-CM | POA: Diagnosis not present

## 2021-12-29 DIAGNOSIS — I69391 Dysphagia following cerebral infarction: Secondary | ICD-10-CM | POA: Diagnosis not present

## 2021-12-29 DIAGNOSIS — Z8781 Personal history of (healed) traumatic fracture: Secondary | ICD-10-CM | POA: Diagnosis not present

## 2021-12-29 DIAGNOSIS — I10 Essential (primary) hypertension: Secondary | ICD-10-CM | POA: Diagnosis not present

## 2021-12-29 DIAGNOSIS — I4891 Unspecified atrial fibrillation: Secondary | ICD-10-CM | POA: Diagnosis not present

## 2021-12-29 DIAGNOSIS — E876 Hypokalemia: Secondary | ICD-10-CM | POA: Diagnosis not present

## 2021-12-29 DIAGNOSIS — E785 Hyperlipidemia, unspecified: Secondary | ICD-10-CM | POA: Diagnosis not present

## 2021-12-29 DIAGNOSIS — Z7982 Long term (current) use of aspirin: Secondary | ICD-10-CM | POA: Diagnosis not present

## 2021-12-29 DIAGNOSIS — M179 Osteoarthritis of knee, unspecified: Secondary | ICD-10-CM | POA: Diagnosis not present

## 2021-12-29 DIAGNOSIS — Z9181 History of falling: Secondary | ICD-10-CM | POA: Diagnosis not present

## 2021-12-29 DIAGNOSIS — I69320 Aphasia following cerebral infarction: Secondary | ICD-10-CM | POA: Diagnosis not present

## 2021-12-29 DIAGNOSIS — L89616 Pressure-induced deep tissue damage of right heel: Secondary | ICD-10-CM | POA: Diagnosis not present

## 2021-12-29 DIAGNOSIS — G9340 Encephalopathy, unspecified: Secondary | ICD-10-CM | POA: Diagnosis not present

## 2021-12-29 DIAGNOSIS — N3000 Acute cystitis without hematuria: Secondary | ICD-10-CM | POA: Diagnosis not present

## 2021-12-29 DIAGNOSIS — D509 Iron deficiency anemia, unspecified: Secondary | ICD-10-CM | POA: Diagnosis not present

## 2021-12-29 DIAGNOSIS — K5900 Constipation, unspecified: Secondary | ICD-10-CM | POA: Diagnosis not present

## 2021-12-29 DIAGNOSIS — E8729 Other acidosis: Secondary | ICD-10-CM | POA: Diagnosis not present

## 2021-12-29 DIAGNOSIS — I69351 Hemiplegia and hemiparesis following cerebral infarction affecting right dominant side: Secondary | ICD-10-CM | POA: Diagnosis not present

## 2021-12-29 DIAGNOSIS — Z992 Dependence on renal dialysis: Secondary | ICD-10-CM | POA: Diagnosis not present

## 2021-12-31 DIAGNOSIS — I69391 Dysphagia following cerebral infarction: Secondary | ICD-10-CM | POA: Diagnosis not present

## 2021-12-31 DIAGNOSIS — I4891 Unspecified atrial fibrillation: Secondary | ICD-10-CM | POA: Diagnosis not present

## 2021-12-31 DIAGNOSIS — E785 Hyperlipidemia, unspecified: Secondary | ICD-10-CM | POA: Diagnosis not present

## 2021-12-31 DIAGNOSIS — N3000 Acute cystitis without hematuria: Secondary | ICD-10-CM | POA: Diagnosis not present

## 2021-12-31 DIAGNOSIS — Z992 Dependence on renal dialysis: Secondary | ICD-10-CM | POA: Diagnosis not present

## 2021-12-31 DIAGNOSIS — E876 Hypokalemia: Secondary | ICD-10-CM | POA: Diagnosis not present

## 2021-12-31 DIAGNOSIS — B965 Pseudomonas (aeruginosa) (mallei) (pseudomallei) as the cause of diseases classified elsewhere: Secondary | ICD-10-CM | POA: Diagnosis not present

## 2021-12-31 DIAGNOSIS — K5900 Constipation, unspecified: Secondary | ICD-10-CM | POA: Diagnosis not present

## 2021-12-31 DIAGNOSIS — G9340 Encephalopathy, unspecified: Secondary | ICD-10-CM | POA: Diagnosis not present

## 2021-12-31 DIAGNOSIS — Z9181 History of falling: Secondary | ICD-10-CM | POA: Diagnosis not present

## 2021-12-31 DIAGNOSIS — L89616 Pressure-induced deep tissue damage of right heel: Secondary | ICD-10-CM | POA: Diagnosis not present

## 2021-12-31 DIAGNOSIS — I10 Essential (primary) hypertension: Secondary | ICD-10-CM | POA: Diagnosis not present

## 2021-12-31 DIAGNOSIS — D509 Iron deficiency anemia, unspecified: Secondary | ICD-10-CM | POA: Diagnosis not present

## 2021-12-31 DIAGNOSIS — Z7982 Long term (current) use of aspirin: Secondary | ICD-10-CM | POA: Diagnosis not present

## 2021-12-31 DIAGNOSIS — Z8781 Personal history of (healed) traumatic fracture: Secondary | ICD-10-CM | POA: Diagnosis not present

## 2021-12-31 DIAGNOSIS — M179 Osteoarthritis of knee, unspecified: Secondary | ICD-10-CM | POA: Diagnosis not present

## 2021-12-31 DIAGNOSIS — I69320 Aphasia following cerebral infarction: Secondary | ICD-10-CM | POA: Diagnosis not present

## 2021-12-31 DIAGNOSIS — E8729 Other acidosis: Secondary | ICD-10-CM | POA: Diagnosis not present

## 2021-12-31 DIAGNOSIS — I69351 Hemiplegia and hemiparesis following cerebral infarction affecting right dominant side: Secondary | ICD-10-CM | POA: Diagnosis not present

## 2022-01-01 DIAGNOSIS — I10 Essential (primary) hypertension: Secondary | ICD-10-CM | POA: Diagnosis not present

## 2022-01-01 DIAGNOSIS — I4891 Unspecified atrial fibrillation: Secondary | ICD-10-CM | POA: Diagnosis not present

## 2022-01-01 DIAGNOSIS — Z9181 History of falling: Secondary | ICD-10-CM | POA: Diagnosis not present

## 2022-01-01 DIAGNOSIS — B965 Pseudomonas (aeruginosa) (mallei) (pseudomallei) as the cause of diseases classified elsewhere: Secondary | ICD-10-CM | POA: Diagnosis not present

## 2022-01-01 DIAGNOSIS — I69391 Dysphagia following cerebral infarction: Secondary | ICD-10-CM | POA: Diagnosis not present

## 2022-01-01 DIAGNOSIS — N3000 Acute cystitis without hematuria: Secondary | ICD-10-CM | POA: Diagnosis not present

## 2022-01-01 DIAGNOSIS — L89616 Pressure-induced deep tissue damage of right heel: Secondary | ICD-10-CM | POA: Diagnosis not present

## 2022-01-01 DIAGNOSIS — E8729 Other acidosis: Secondary | ICD-10-CM | POA: Diagnosis not present

## 2022-01-01 DIAGNOSIS — Z7982 Long term (current) use of aspirin: Secondary | ICD-10-CM | POA: Diagnosis not present

## 2022-01-01 DIAGNOSIS — G9340 Encephalopathy, unspecified: Secondary | ICD-10-CM | POA: Diagnosis not present

## 2022-01-01 DIAGNOSIS — Z8781 Personal history of (healed) traumatic fracture: Secondary | ICD-10-CM | POA: Diagnosis not present

## 2022-01-01 DIAGNOSIS — D509 Iron deficiency anemia, unspecified: Secondary | ICD-10-CM | POA: Diagnosis not present

## 2022-01-01 DIAGNOSIS — E785 Hyperlipidemia, unspecified: Secondary | ICD-10-CM | POA: Diagnosis not present

## 2022-01-01 DIAGNOSIS — I69320 Aphasia following cerebral infarction: Secondary | ICD-10-CM | POA: Diagnosis not present

## 2022-01-01 DIAGNOSIS — K5900 Constipation, unspecified: Secondary | ICD-10-CM | POA: Diagnosis not present

## 2022-01-01 DIAGNOSIS — Z992 Dependence on renal dialysis: Secondary | ICD-10-CM | POA: Diagnosis not present

## 2022-01-01 DIAGNOSIS — E876 Hypokalemia: Secondary | ICD-10-CM | POA: Diagnosis not present

## 2022-01-01 DIAGNOSIS — M179 Osteoarthritis of knee, unspecified: Secondary | ICD-10-CM | POA: Diagnosis not present

## 2022-01-01 DIAGNOSIS — I69351 Hemiplegia and hemiparesis following cerebral infarction affecting right dominant side: Secondary | ICD-10-CM | POA: Diagnosis not present

## 2022-01-05 DIAGNOSIS — K5900 Constipation, unspecified: Secondary | ICD-10-CM | POA: Diagnosis not present

## 2022-01-05 DIAGNOSIS — Z992 Dependence on renal dialysis: Secondary | ICD-10-CM | POA: Diagnosis not present

## 2022-01-05 DIAGNOSIS — Z9181 History of falling: Secondary | ICD-10-CM | POA: Diagnosis not present

## 2022-01-05 DIAGNOSIS — N3000 Acute cystitis without hematuria: Secondary | ICD-10-CM | POA: Diagnosis not present

## 2022-01-05 DIAGNOSIS — I69320 Aphasia following cerebral infarction: Secondary | ICD-10-CM | POA: Diagnosis not present

## 2022-01-05 DIAGNOSIS — G9340 Encephalopathy, unspecified: Secondary | ICD-10-CM | POA: Diagnosis not present

## 2022-01-05 DIAGNOSIS — L89616 Pressure-induced deep tissue damage of right heel: Secondary | ICD-10-CM | POA: Diagnosis not present

## 2022-01-05 DIAGNOSIS — I69391 Dysphagia following cerebral infarction: Secondary | ICD-10-CM | POA: Diagnosis not present

## 2022-01-05 DIAGNOSIS — I69351 Hemiplegia and hemiparesis following cerebral infarction affecting right dominant side: Secondary | ICD-10-CM | POA: Diagnosis not present

## 2022-01-05 DIAGNOSIS — E876 Hypokalemia: Secondary | ICD-10-CM | POA: Diagnosis not present

## 2022-01-05 DIAGNOSIS — E785 Hyperlipidemia, unspecified: Secondary | ICD-10-CM | POA: Diagnosis not present

## 2022-01-05 DIAGNOSIS — Z7982 Long term (current) use of aspirin: Secondary | ICD-10-CM | POA: Diagnosis not present

## 2022-01-05 DIAGNOSIS — I4891 Unspecified atrial fibrillation: Secondary | ICD-10-CM | POA: Diagnosis not present

## 2022-01-05 DIAGNOSIS — E8729 Other acidosis: Secondary | ICD-10-CM | POA: Diagnosis not present

## 2022-01-05 DIAGNOSIS — I10 Essential (primary) hypertension: Secondary | ICD-10-CM | POA: Diagnosis not present

## 2022-01-05 DIAGNOSIS — Z8781 Personal history of (healed) traumatic fracture: Secondary | ICD-10-CM | POA: Diagnosis not present

## 2022-01-05 DIAGNOSIS — M179 Osteoarthritis of knee, unspecified: Secondary | ICD-10-CM | POA: Diagnosis not present

## 2022-01-05 DIAGNOSIS — B965 Pseudomonas (aeruginosa) (mallei) (pseudomallei) as the cause of diseases classified elsewhere: Secondary | ICD-10-CM | POA: Diagnosis not present

## 2022-01-05 DIAGNOSIS — D509 Iron deficiency anemia, unspecified: Secondary | ICD-10-CM | POA: Diagnosis not present

## 2022-01-06 DIAGNOSIS — I69391 Dysphagia following cerebral infarction: Secondary | ICD-10-CM | POA: Diagnosis not present

## 2022-01-06 DIAGNOSIS — L89616 Pressure-induced deep tissue damage of right heel: Secondary | ICD-10-CM | POA: Diagnosis not present

## 2022-01-06 DIAGNOSIS — E8729 Other acidosis: Secondary | ICD-10-CM | POA: Diagnosis not present

## 2022-01-06 DIAGNOSIS — Z992 Dependence on renal dialysis: Secondary | ICD-10-CM | POA: Diagnosis not present

## 2022-01-06 DIAGNOSIS — D509 Iron deficiency anemia, unspecified: Secondary | ICD-10-CM | POA: Diagnosis not present

## 2022-01-06 DIAGNOSIS — I69351 Hemiplegia and hemiparesis following cerebral infarction affecting right dominant side: Secondary | ICD-10-CM | POA: Diagnosis not present

## 2022-01-06 DIAGNOSIS — Z8781 Personal history of (healed) traumatic fracture: Secondary | ICD-10-CM | POA: Diagnosis not present

## 2022-01-06 DIAGNOSIS — Z7982 Long term (current) use of aspirin: Secondary | ICD-10-CM | POA: Diagnosis not present

## 2022-01-06 DIAGNOSIS — B965 Pseudomonas (aeruginosa) (mallei) (pseudomallei) as the cause of diseases classified elsewhere: Secondary | ICD-10-CM | POA: Diagnosis not present

## 2022-01-06 DIAGNOSIS — M179 Osteoarthritis of knee, unspecified: Secondary | ICD-10-CM | POA: Diagnosis not present

## 2022-01-06 DIAGNOSIS — I10 Essential (primary) hypertension: Secondary | ICD-10-CM | POA: Diagnosis not present

## 2022-01-06 DIAGNOSIS — G9340 Encephalopathy, unspecified: Secondary | ICD-10-CM | POA: Diagnosis not present

## 2022-01-06 DIAGNOSIS — Z9181 History of falling: Secondary | ICD-10-CM | POA: Diagnosis not present

## 2022-01-06 DIAGNOSIS — N3000 Acute cystitis without hematuria: Secondary | ICD-10-CM | POA: Diagnosis not present

## 2022-01-06 DIAGNOSIS — I69320 Aphasia following cerebral infarction: Secondary | ICD-10-CM | POA: Diagnosis not present

## 2022-01-06 DIAGNOSIS — I4891 Unspecified atrial fibrillation: Secondary | ICD-10-CM | POA: Diagnosis not present

## 2022-01-06 DIAGNOSIS — E785 Hyperlipidemia, unspecified: Secondary | ICD-10-CM | POA: Diagnosis not present

## 2022-01-06 DIAGNOSIS — E876 Hypokalemia: Secondary | ICD-10-CM | POA: Diagnosis not present

## 2022-01-06 DIAGNOSIS — K5900 Constipation, unspecified: Secondary | ICD-10-CM | POA: Diagnosis not present

## 2022-01-08 DIAGNOSIS — I63412 Cerebral infarction due to embolism of left middle cerebral artery: Secondary | ICD-10-CM | POA: Diagnosis not present

## 2022-01-08 DIAGNOSIS — Z9181 History of falling: Secondary | ICD-10-CM | POA: Diagnosis not present

## 2022-01-08 DIAGNOSIS — R269 Unspecified abnormalities of gait and mobility: Secondary | ICD-10-CM | POA: Diagnosis not present

## 2022-01-08 DIAGNOSIS — D509 Iron deficiency anemia, unspecified: Secondary | ICD-10-CM | POA: Diagnosis not present

## 2022-01-08 DIAGNOSIS — M179 Osteoarthritis of knee, unspecified: Secondary | ICD-10-CM | POA: Diagnosis not present

## 2022-01-08 DIAGNOSIS — Z992 Dependence on renal dialysis: Secondary | ICD-10-CM | POA: Diagnosis not present

## 2022-01-08 DIAGNOSIS — N3000 Acute cystitis without hematuria: Secondary | ICD-10-CM | POA: Diagnosis not present

## 2022-01-08 DIAGNOSIS — E8729 Other acidosis: Secondary | ICD-10-CM | POA: Diagnosis not present

## 2022-01-08 DIAGNOSIS — B965 Pseudomonas (aeruginosa) (mallei) (pseudomallei) as the cause of diseases classified elsewhere: Secondary | ICD-10-CM | POA: Diagnosis not present

## 2022-01-08 DIAGNOSIS — I10 Essential (primary) hypertension: Secondary | ICD-10-CM | POA: Diagnosis not present

## 2022-01-08 DIAGNOSIS — Z8781 Personal history of (healed) traumatic fracture: Secondary | ICD-10-CM | POA: Diagnosis not present

## 2022-01-08 DIAGNOSIS — I4891 Unspecified atrial fibrillation: Secondary | ICD-10-CM | POA: Diagnosis not present

## 2022-01-08 DIAGNOSIS — L89616 Pressure-induced deep tissue damage of right heel: Secondary | ICD-10-CM | POA: Diagnosis not present

## 2022-01-08 DIAGNOSIS — I69391 Dysphagia following cerebral infarction: Secondary | ICD-10-CM | POA: Diagnosis not present

## 2022-01-08 DIAGNOSIS — K5900 Constipation, unspecified: Secondary | ICD-10-CM | POA: Diagnosis not present

## 2022-01-08 DIAGNOSIS — Z7982 Long term (current) use of aspirin: Secondary | ICD-10-CM | POA: Diagnosis not present

## 2022-01-08 DIAGNOSIS — G9340 Encephalopathy, unspecified: Secondary | ICD-10-CM | POA: Diagnosis not present

## 2022-01-08 DIAGNOSIS — E876 Hypokalemia: Secondary | ICD-10-CM | POA: Diagnosis not present

## 2022-01-08 DIAGNOSIS — I69320 Aphasia following cerebral infarction: Secondary | ICD-10-CM | POA: Diagnosis not present

## 2022-01-08 DIAGNOSIS — E785 Hyperlipidemia, unspecified: Secondary | ICD-10-CM | POA: Diagnosis not present

## 2022-01-08 DIAGNOSIS — I69351 Hemiplegia and hemiparesis following cerebral infarction affecting right dominant side: Secondary | ICD-10-CM | POA: Diagnosis not present

## 2022-01-08 DIAGNOSIS — M00062 Staphylococcal arthritis, left knee: Secondary | ICD-10-CM | POA: Diagnosis not present

## 2022-01-09 DIAGNOSIS — S81801A Unspecified open wound, right lower leg, initial encounter: Secondary | ICD-10-CM | POA: Diagnosis not present

## 2022-01-12 DIAGNOSIS — M179 Osteoarthritis of knee, unspecified: Secondary | ICD-10-CM | POA: Diagnosis not present

## 2022-01-12 DIAGNOSIS — D509 Iron deficiency anemia, unspecified: Secondary | ICD-10-CM | POA: Diagnosis not present

## 2022-01-12 DIAGNOSIS — G9340 Encephalopathy, unspecified: Secondary | ICD-10-CM | POA: Diagnosis not present

## 2022-01-12 DIAGNOSIS — K5900 Constipation, unspecified: Secondary | ICD-10-CM | POA: Diagnosis not present

## 2022-01-12 DIAGNOSIS — E785 Hyperlipidemia, unspecified: Secondary | ICD-10-CM | POA: Diagnosis not present

## 2022-01-12 DIAGNOSIS — E8729 Other acidosis: Secondary | ICD-10-CM | POA: Diagnosis not present

## 2022-01-12 DIAGNOSIS — I4891 Unspecified atrial fibrillation: Secondary | ICD-10-CM | POA: Diagnosis not present

## 2022-01-12 DIAGNOSIS — I69320 Aphasia following cerebral infarction: Secondary | ICD-10-CM | POA: Diagnosis not present

## 2022-01-12 DIAGNOSIS — Z992 Dependence on renal dialysis: Secondary | ICD-10-CM | POA: Diagnosis not present

## 2022-01-12 DIAGNOSIS — Z9181 History of falling: Secondary | ICD-10-CM | POA: Diagnosis not present

## 2022-01-12 DIAGNOSIS — I10 Essential (primary) hypertension: Secondary | ICD-10-CM | POA: Diagnosis not present

## 2022-01-12 DIAGNOSIS — E876 Hypokalemia: Secondary | ICD-10-CM | POA: Diagnosis not present

## 2022-01-12 DIAGNOSIS — Z8781 Personal history of (healed) traumatic fracture: Secondary | ICD-10-CM | POA: Diagnosis not present

## 2022-01-12 DIAGNOSIS — I69391 Dysphagia following cerebral infarction: Secondary | ICD-10-CM | POA: Diagnosis not present

## 2022-01-12 DIAGNOSIS — L89616 Pressure-induced deep tissue damage of right heel: Secondary | ICD-10-CM | POA: Diagnosis not present

## 2022-01-12 DIAGNOSIS — N3000 Acute cystitis without hematuria: Secondary | ICD-10-CM | POA: Diagnosis not present

## 2022-01-12 DIAGNOSIS — B965 Pseudomonas (aeruginosa) (mallei) (pseudomallei) as the cause of diseases classified elsewhere: Secondary | ICD-10-CM | POA: Diagnosis not present

## 2022-01-12 DIAGNOSIS — I69351 Hemiplegia and hemiparesis following cerebral infarction affecting right dominant side: Secondary | ICD-10-CM | POA: Diagnosis not present

## 2022-01-12 DIAGNOSIS — Z7982 Long term (current) use of aspirin: Secondary | ICD-10-CM | POA: Diagnosis not present

## 2022-01-14 DIAGNOSIS — E876 Hypokalemia: Secondary | ICD-10-CM | POA: Diagnosis not present

## 2022-01-14 DIAGNOSIS — I4891 Unspecified atrial fibrillation: Secondary | ICD-10-CM | POA: Diagnosis not present

## 2022-01-14 DIAGNOSIS — Z8781 Personal history of (healed) traumatic fracture: Secondary | ICD-10-CM | POA: Diagnosis not present

## 2022-01-14 DIAGNOSIS — I69320 Aphasia following cerebral infarction: Secondary | ICD-10-CM | POA: Diagnosis not present

## 2022-01-14 DIAGNOSIS — I69351 Hemiplegia and hemiparesis following cerebral infarction affecting right dominant side: Secondary | ICD-10-CM | POA: Diagnosis not present

## 2022-01-14 DIAGNOSIS — B965 Pseudomonas (aeruginosa) (mallei) (pseudomallei) as the cause of diseases classified elsewhere: Secondary | ICD-10-CM | POA: Diagnosis not present

## 2022-01-14 DIAGNOSIS — I10 Essential (primary) hypertension: Secondary | ICD-10-CM | POA: Diagnosis not present

## 2022-01-14 DIAGNOSIS — G9340 Encephalopathy, unspecified: Secondary | ICD-10-CM | POA: Diagnosis not present

## 2022-01-14 DIAGNOSIS — M179 Osteoarthritis of knee, unspecified: Secondary | ICD-10-CM | POA: Diagnosis not present

## 2022-01-14 DIAGNOSIS — Z9181 History of falling: Secondary | ICD-10-CM | POA: Diagnosis not present

## 2022-01-14 DIAGNOSIS — N3000 Acute cystitis without hematuria: Secondary | ICD-10-CM | POA: Diagnosis not present

## 2022-01-14 DIAGNOSIS — E8729 Other acidosis: Secondary | ICD-10-CM | POA: Diagnosis not present

## 2022-01-14 DIAGNOSIS — E785 Hyperlipidemia, unspecified: Secondary | ICD-10-CM | POA: Diagnosis not present

## 2022-01-14 DIAGNOSIS — L89616 Pressure-induced deep tissue damage of right heel: Secondary | ICD-10-CM | POA: Diagnosis not present

## 2022-01-14 DIAGNOSIS — Z992 Dependence on renal dialysis: Secondary | ICD-10-CM | POA: Diagnosis not present

## 2022-01-14 DIAGNOSIS — Z7982 Long term (current) use of aspirin: Secondary | ICD-10-CM | POA: Diagnosis not present

## 2022-01-14 DIAGNOSIS — K5900 Constipation, unspecified: Secondary | ICD-10-CM | POA: Diagnosis not present

## 2022-01-14 DIAGNOSIS — D509 Iron deficiency anemia, unspecified: Secondary | ICD-10-CM | POA: Diagnosis not present

## 2022-01-14 DIAGNOSIS — I69391 Dysphagia following cerebral infarction: Secondary | ICD-10-CM | POA: Diagnosis not present

## 2022-01-15 ENCOUNTER — Telehealth: Payer: Self-pay | Admitting: Cardiology

## 2022-01-15 NOTE — Telephone Encounter (Signed)
Patient's mother wants to speak with someone about Sander's echo results.

## 2022-01-15 NOTE — Telephone Encounter (Signed)
Left message for pt mother to call  

## 2022-01-16 NOTE — Telephone Encounter (Signed)
Spoke to patient's mother. States she has patient at home now   Result given, voice understanding.

## 2022-01-16 NOTE — Telephone Encounter (Signed)
Debra Klein, Debra Klein    Chriss Driver, RN 11/27/2021 11:12 AM EST     Spoke with patient regarding the following results. Patient made aware and patient verbalized understanding.   Patient inquiring about dental clearance- advised her to have her dentist fax it over- fax number provided.   Advised patient to call back to office with any issues, questions, or concerns. Patient verbalized understanding.   Lelon Perla, MD 11/25/2021  3:21 PM EST     Normal LV function; mild to moderate MR; no change in meds [name]     Per the above, the inforamtion was given to the patient.  Called , no answer left message for [name]  to call back

## 2022-01-20 DIAGNOSIS — M179 Osteoarthritis of knee, unspecified: Secondary | ICD-10-CM | POA: Diagnosis not present

## 2022-01-20 DIAGNOSIS — I69320 Aphasia following cerebral infarction: Secondary | ICD-10-CM | POA: Diagnosis not present

## 2022-01-20 DIAGNOSIS — L89616 Pressure-induced deep tissue damage of right heel: Secondary | ICD-10-CM | POA: Diagnosis not present

## 2022-01-20 DIAGNOSIS — I69391 Dysphagia following cerebral infarction: Secondary | ICD-10-CM | POA: Diagnosis not present

## 2022-01-20 DIAGNOSIS — I69351 Hemiplegia and hemiparesis following cerebral infarction affecting right dominant side: Secondary | ICD-10-CM | POA: Diagnosis not present

## 2022-01-20 DIAGNOSIS — Z8781 Personal history of (healed) traumatic fracture: Secondary | ICD-10-CM | POA: Diagnosis not present

## 2022-01-20 DIAGNOSIS — Z9181 History of falling: Secondary | ICD-10-CM | POA: Diagnosis not present

## 2022-01-20 DIAGNOSIS — N3000 Acute cystitis without hematuria: Secondary | ICD-10-CM | POA: Diagnosis not present

## 2022-01-20 DIAGNOSIS — Z7982 Long term (current) use of aspirin: Secondary | ICD-10-CM | POA: Diagnosis not present

## 2022-01-20 DIAGNOSIS — D509 Iron deficiency anemia, unspecified: Secondary | ICD-10-CM | POA: Diagnosis not present

## 2022-01-20 DIAGNOSIS — E8729 Other acidosis: Secondary | ICD-10-CM | POA: Diagnosis not present

## 2022-01-20 DIAGNOSIS — G9340 Encephalopathy, unspecified: Secondary | ICD-10-CM | POA: Diagnosis not present

## 2022-01-20 DIAGNOSIS — I10 Essential (primary) hypertension: Secondary | ICD-10-CM | POA: Diagnosis not present

## 2022-01-20 DIAGNOSIS — Z992 Dependence on renal dialysis: Secondary | ICD-10-CM | POA: Diagnosis not present

## 2022-01-20 DIAGNOSIS — E876 Hypokalemia: Secondary | ICD-10-CM | POA: Diagnosis not present

## 2022-01-20 DIAGNOSIS — B965 Pseudomonas (aeruginosa) (mallei) (pseudomallei) as the cause of diseases classified elsewhere: Secondary | ICD-10-CM | POA: Diagnosis not present

## 2022-01-20 DIAGNOSIS — K5900 Constipation, unspecified: Secondary | ICD-10-CM | POA: Diagnosis not present

## 2022-01-20 DIAGNOSIS — E785 Hyperlipidemia, unspecified: Secondary | ICD-10-CM | POA: Diagnosis not present

## 2022-01-20 DIAGNOSIS — I4891 Unspecified atrial fibrillation: Secondary | ICD-10-CM | POA: Diagnosis not present

## 2022-01-26 DIAGNOSIS — G9340 Encephalopathy, unspecified: Secondary | ICD-10-CM | POA: Diagnosis not present

## 2022-01-26 DIAGNOSIS — E876 Hypokalemia: Secondary | ICD-10-CM | POA: Diagnosis not present

## 2022-01-26 DIAGNOSIS — E8729 Other acidosis: Secondary | ICD-10-CM | POA: Diagnosis not present

## 2022-01-26 DIAGNOSIS — I4891 Unspecified atrial fibrillation: Secondary | ICD-10-CM | POA: Diagnosis not present

## 2022-01-26 DIAGNOSIS — I10 Essential (primary) hypertension: Secondary | ICD-10-CM | POA: Diagnosis not present

## 2022-01-26 DIAGNOSIS — B965 Pseudomonas (aeruginosa) (mallei) (pseudomallei) as the cause of diseases classified elsewhere: Secondary | ICD-10-CM | POA: Diagnosis not present

## 2022-01-26 DIAGNOSIS — Z9181 History of falling: Secondary | ICD-10-CM | POA: Diagnosis not present

## 2022-01-26 DIAGNOSIS — L89616 Pressure-induced deep tissue damage of right heel: Secondary | ICD-10-CM | POA: Diagnosis not present

## 2022-01-26 DIAGNOSIS — D509 Iron deficiency anemia, unspecified: Secondary | ICD-10-CM | POA: Diagnosis not present

## 2022-01-26 DIAGNOSIS — E785 Hyperlipidemia, unspecified: Secondary | ICD-10-CM | POA: Diagnosis not present

## 2022-01-26 DIAGNOSIS — Z8781 Personal history of (healed) traumatic fracture: Secondary | ICD-10-CM | POA: Diagnosis not present

## 2022-01-26 DIAGNOSIS — Z7982 Long term (current) use of aspirin: Secondary | ICD-10-CM | POA: Diagnosis not present

## 2022-01-26 DIAGNOSIS — Z992 Dependence on renal dialysis: Secondary | ICD-10-CM | POA: Diagnosis not present

## 2022-01-26 DIAGNOSIS — N3000 Acute cystitis without hematuria: Secondary | ICD-10-CM | POA: Diagnosis not present

## 2022-01-26 DIAGNOSIS — I69351 Hemiplegia and hemiparesis following cerebral infarction affecting right dominant side: Secondary | ICD-10-CM | POA: Diagnosis not present

## 2022-01-26 DIAGNOSIS — M179 Osteoarthritis of knee, unspecified: Secondary | ICD-10-CM | POA: Diagnosis not present

## 2022-01-26 DIAGNOSIS — I69391 Dysphagia following cerebral infarction: Secondary | ICD-10-CM | POA: Diagnosis not present

## 2022-01-26 DIAGNOSIS — K5900 Constipation, unspecified: Secondary | ICD-10-CM | POA: Diagnosis not present

## 2022-01-26 DIAGNOSIS — I69320 Aphasia following cerebral infarction: Secondary | ICD-10-CM | POA: Diagnosis not present

## 2022-01-27 DIAGNOSIS — I69351 Hemiplegia and hemiparesis following cerebral infarction affecting right dominant side: Secondary | ICD-10-CM | POA: Diagnosis not present

## 2022-01-27 DIAGNOSIS — Z8781 Personal history of (healed) traumatic fracture: Secondary | ICD-10-CM | POA: Diagnosis not present

## 2022-01-27 DIAGNOSIS — G9340 Encephalopathy, unspecified: Secondary | ICD-10-CM | POA: Diagnosis not present

## 2022-01-27 DIAGNOSIS — Z9181 History of falling: Secondary | ICD-10-CM | POA: Diagnosis not present

## 2022-01-27 DIAGNOSIS — D509 Iron deficiency anemia, unspecified: Secondary | ICD-10-CM | POA: Diagnosis not present

## 2022-01-27 DIAGNOSIS — I69391 Dysphagia following cerebral infarction: Secondary | ICD-10-CM | POA: Diagnosis not present

## 2022-01-27 DIAGNOSIS — N3000 Acute cystitis without hematuria: Secondary | ICD-10-CM | POA: Diagnosis not present

## 2022-01-27 DIAGNOSIS — E785 Hyperlipidemia, unspecified: Secondary | ICD-10-CM | POA: Diagnosis not present

## 2022-01-27 DIAGNOSIS — E8729 Other acidosis: Secondary | ICD-10-CM | POA: Diagnosis not present

## 2022-01-27 DIAGNOSIS — L89616 Pressure-induced deep tissue damage of right heel: Secondary | ICD-10-CM | POA: Diagnosis not present

## 2022-01-27 DIAGNOSIS — I4891 Unspecified atrial fibrillation: Secondary | ICD-10-CM | POA: Diagnosis not present

## 2022-01-27 DIAGNOSIS — I10 Essential (primary) hypertension: Secondary | ICD-10-CM | POA: Diagnosis not present

## 2022-01-27 DIAGNOSIS — Z992 Dependence on renal dialysis: Secondary | ICD-10-CM | POA: Diagnosis not present

## 2022-01-27 DIAGNOSIS — M179 Osteoarthritis of knee, unspecified: Secondary | ICD-10-CM | POA: Diagnosis not present

## 2022-01-27 DIAGNOSIS — Z7982 Long term (current) use of aspirin: Secondary | ICD-10-CM | POA: Diagnosis not present

## 2022-01-27 DIAGNOSIS — E876 Hypokalemia: Secondary | ICD-10-CM | POA: Diagnosis not present

## 2022-01-27 DIAGNOSIS — I69320 Aphasia following cerebral infarction: Secondary | ICD-10-CM | POA: Diagnosis not present

## 2022-01-27 DIAGNOSIS — B965 Pseudomonas (aeruginosa) (mallei) (pseudomallei) as the cause of diseases classified elsewhere: Secondary | ICD-10-CM | POA: Diagnosis not present

## 2022-01-27 DIAGNOSIS — K5900 Constipation, unspecified: Secondary | ICD-10-CM | POA: Diagnosis not present

## 2022-01-28 DIAGNOSIS — I10 Essential (primary) hypertension: Secondary | ICD-10-CM | POA: Diagnosis not present

## 2022-02-02 DIAGNOSIS — I69351 Hemiplegia and hemiparesis following cerebral infarction affecting right dominant side: Secondary | ICD-10-CM | POA: Diagnosis not present

## 2022-02-02 DIAGNOSIS — K5900 Constipation, unspecified: Secondary | ICD-10-CM | POA: Diagnosis not present

## 2022-02-02 DIAGNOSIS — E876 Hypokalemia: Secondary | ICD-10-CM | POA: Diagnosis not present

## 2022-02-02 DIAGNOSIS — D509 Iron deficiency anemia, unspecified: Secondary | ICD-10-CM | POA: Diagnosis not present

## 2022-02-02 DIAGNOSIS — I69320 Aphasia following cerebral infarction: Secondary | ICD-10-CM | POA: Diagnosis not present

## 2022-02-02 DIAGNOSIS — I4891 Unspecified atrial fibrillation: Secondary | ICD-10-CM | POA: Diagnosis not present

## 2022-02-02 DIAGNOSIS — Z992 Dependence on renal dialysis: Secondary | ICD-10-CM | POA: Diagnosis not present

## 2022-02-02 DIAGNOSIS — Z7982 Long term (current) use of aspirin: Secondary | ICD-10-CM | POA: Diagnosis not present

## 2022-02-02 DIAGNOSIS — L89616 Pressure-induced deep tissue damage of right heel: Secondary | ICD-10-CM | POA: Diagnosis not present

## 2022-02-02 DIAGNOSIS — I69391 Dysphagia following cerebral infarction: Secondary | ICD-10-CM | POA: Diagnosis not present

## 2022-02-02 DIAGNOSIS — Z8781 Personal history of (healed) traumatic fracture: Secondary | ICD-10-CM | POA: Diagnosis not present

## 2022-02-02 DIAGNOSIS — Z9181 History of falling: Secondary | ICD-10-CM | POA: Diagnosis not present

## 2022-02-02 DIAGNOSIS — E8729 Other acidosis: Secondary | ICD-10-CM | POA: Diagnosis not present

## 2022-02-02 DIAGNOSIS — M179 Osteoarthritis of knee, unspecified: Secondary | ICD-10-CM | POA: Diagnosis not present

## 2022-02-02 DIAGNOSIS — B965 Pseudomonas (aeruginosa) (mallei) (pseudomallei) as the cause of diseases classified elsewhere: Secondary | ICD-10-CM | POA: Diagnosis not present

## 2022-02-02 DIAGNOSIS — I10 Essential (primary) hypertension: Secondary | ICD-10-CM | POA: Diagnosis not present

## 2022-02-02 DIAGNOSIS — N3000 Acute cystitis without hematuria: Secondary | ICD-10-CM | POA: Diagnosis not present

## 2022-02-02 DIAGNOSIS — E785 Hyperlipidemia, unspecified: Secondary | ICD-10-CM | POA: Diagnosis not present

## 2022-02-02 DIAGNOSIS — G9340 Encephalopathy, unspecified: Secondary | ICD-10-CM | POA: Diagnosis not present

## 2022-02-03 DIAGNOSIS — Z7982 Long term (current) use of aspirin: Secondary | ICD-10-CM | POA: Diagnosis not present

## 2022-02-03 DIAGNOSIS — E785 Hyperlipidemia, unspecified: Secondary | ICD-10-CM | POA: Diagnosis not present

## 2022-02-03 DIAGNOSIS — Z8781 Personal history of (healed) traumatic fracture: Secondary | ICD-10-CM | POA: Diagnosis not present

## 2022-02-03 DIAGNOSIS — K5900 Constipation, unspecified: Secondary | ICD-10-CM | POA: Diagnosis not present

## 2022-02-03 DIAGNOSIS — E8729 Other acidosis: Secondary | ICD-10-CM | POA: Diagnosis not present

## 2022-02-03 DIAGNOSIS — E876 Hypokalemia: Secondary | ICD-10-CM | POA: Diagnosis not present

## 2022-02-03 DIAGNOSIS — I4891 Unspecified atrial fibrillation: Secondary | ICD-10-CM | POA: Diagnosis not present

## 2022-02-03 DIAGNOSIS — I10 Essential (primary) hypertension: Secondary | ICD-10-CM | POA: Diagnosis not present

## 2022-02-03 DIAGNOSIS — B965 Pseudomonas (aeruginosa) (mallei) (pseudomallei) as the cause of diseases classified elsewhere: Secondary | ICD-10-CM | POA: Diagnosis not present

## 2022-02-03 DIAGNOSIS — N3000 Acute cystitis without hematuria: Secondary | ICD-10-CM | POA: Diagnosis not present

## 2022-02-03 DIAGNOSIS — I69320 Aphasia following cerebral infarction: Secondary | ICD-10-CM | POA: Diagnosis not present

## 2022-02-03 DIAGNOSIS — I69391 Dysphagia following cerebral infarction: Secondary | ICD-10-CM | POA: Diagnosis not present

## 2022-02-03 DIAGNOSIS — Z992 Dependence on renal dialysis: Secondary | ICD-10-CM | POA: Diagnosis not present

## 2022-02-03 DIAGNOSIS — G9340 Encephalopathy, unspecified: Secondary | ICD-10-CM | POA: Diagnosis not present

## 2022-02-03 DIAGNOSIS — M179 Osteoarthritis of knee, unspecified: Secondary | ICD-10-CM | POA: Diagnosis not present

## 2022-02-03 DIAGNOSIS — D509 Iron deficiency anemia, unspecified: Secondary | ICD-10-CM | POA: Diagnosis not present

## 2022-02-03 DIAGNOSIS — L89616 Pressure-induced deep tissue damage of right heel: Secondary | ICD-10-CM | POA: Diagnosis not present

## 2022-02-03 DIAGNOSIS — Z9181 History of falling: Secondary | ICD-10-CM | POA: Diagnosis not present

## 2022-02-03 DIAGNOSIS — I69351 Hemiplegia and hemiparesis following cerebral infarction affecting right dominant side: Secondary | ICD-10-CM | POA: Diagnosis not present

## 2022-02-04 DIAGNOSIS — I69351 Hemiplegia and hemiparesis following cerebral infarction affecting right dominant side: Secondary | ICD-10-CM | POA: Diagnosis not present

## 2022-02-04 DIAGNOSIS — E876 Hypokalemia: Secondary | ICD-10-CM | POA: Diagnosis not present

## 2022-02-04 DIAGNOSIS — I69391 Dysphagia following cerebral infarction: Secondary | ICD-10-CM | POA: Diagnosis not present

## 2022-02-04 DIAGNOSIS — D509 Iron deficiency anemia, unspecified: Secondary | ICD-10-CM | POA: Diagnosis not present

## 2022-02-04 DIAGNOSIS — M179 Osteoarthritis of knee, unspecified: Secondary | ICD-10-CM | POA: Diagnosis not present

## 2022-02-04 DIAGNOSIS — N3000 Acute cystitis without hematuria: Secondary | ICD-10-CM | POA: Diagnosis not present

## 2022-02-04 DIAGNOSIS — I4891 Unspecified atrial fibrillation: Secondary | ICD-10-CM | POA: Diagnosis not present

## 2022-02-04 DIAGNOSIS — I10 Essential (primary) hypertension: Secondary | ICD-10-CM | POA: Diagnosis not present

## 2022-02-04 DIAGNOSIS — G9340 Encephalopathy, unspecified: Secondary | ICD-10-CM | POA: Diagnosis not present

## 2022-02-04 DIAGNOSIS — E8729 Other acidosis: Secondary | ICD-10-CM | POA: Diagnosis not present

## 2022-02-04 DIAGNOSIS — Z9181 History of falling: Secondary | ICD-10-CM | POA: Diagnosis not present

## 2022-02-04 DIAGNOSIS — B965 Pseudomonas (aeruginosa) (mallei) (pseudomallei) as the cause of diseases classified elsewhere: Secondary | ICD-10-CM | POA: Diagnosis not present

## 2022-02-04 DIAGNOSIS — I69320 Aphasia following cerebral infarction: Secondary | ICD-10-CM | POA: Diagnosis not present

## 2022-02-04 DIAGNOSIS — K5900 Constipation, unspecified: Secondary | ICD-10-CM | POA: Diagnosis not present

## 2022-02-04 DIAGNOSIS — Z7982 Long term (current) use of aspirin: Secondary | ICD-10-CM | POA: Diagnosis not present

## 2022-02-04 DIAGNOSIS — Z8781 Personal history of (healed) traumatic fracture: Secondary | ICD-10-CM | POA: Diagnosis not present

## 2022-02-04 DIAGNOSIS — Z992 Dependence on renal dialysis: Secondary | ICD-10-CM | POA: Diagnosis not present

## 2022-02-04 DIAGNOSIS — L89616 Pressure-induced deep tissue damage of right heel: Secondary | ICD-10-CM | POA: Diagnosis not present

## 2022-02-04 DIAGNOSIS — E785 Hyperlipidemia, unspecified: Secondary | ICD-10-CM | POA: Diagnosis not present

## 2022-02-06 DIAGNOSIS — Z9181 History of falling: Secondary | ICD-10-CM | POA: Diagnosis not present

## 2022-02-06 DIAGNOSIS — K5900 Constipation, unspecified: Secondary | ICD-10-CM | POA: Diagnosis not present

## 2022-02-06 DIAGNOSIS — I10 Essential (primary) hypertension: Secondary | ICD-10-CM | POA: Diagnosis not present

## 2022-02-06 DIAGNOSIS — I69320 Aphasia following cerebral infarction: Secondary | ICD-10-CM | POA: Diagnosis not present

## 2022-02-06 DIAGNOSIS — G9340 Encephalopathy, unspecified: Secondary | ICD-10-CM | POA: Diagnosis not present

## 2022-02-06 DIAGNOSIS — Z992 Dependence on renal dialysis: Secondary | ICD-10-CM | POA: Diagnosis not present

## 2022-02-06 DIAGNOSIS — E8729 Other acidosis: Secondary | ICD-10-CM | POA: Diagnosis not present

## 2022-02-06 DIAGNOSIS — D509 Iron deficiency anemia, unspecified: Secondary | ICD-10-CM | POA: Diagnosis not present

## 2022-02-06 DIAGNOSIS — L89616 Pressure-induced deep tissue damage of right heel: Secondary | ICD-10-CM | POA: Diagnosis not present

## 2022-02-06 DIAGNOSIS — B965 Pseudomonas (aeruginosa) (mallei) (pseudomallei) as the cause of diseases classified elsewhere: Secondary | ICD-10-CM | POA: Diagnosis not present

## 2022-02-06 DIAGNOSIS — I4891 Unspecified atrial fibrillation: Secondary | ICD-10-CM | POA: Diagnosis not present

## 2022-02-06 DIAGNOSIS — I69351 Hemiplegia and hemiparesis following cerebral infarction affecting right dominant side: Secondary | ICD-10-CM | POA: Diagnosis not present

## 2022-02-06 DIAGNOSIS — E876 Hypokalemia: Secondary | ICD-10-CM | POA: Diagnosis not present

## 2022-02-06 DIAGNOSIS — N3000 Acute cystitis without hematuria: Secondary | ICD-10-CM | POA: Diagnosis not present

## 2022-02-06 DIAGNOSIS — I69391 Dysphagia following cerebral infarction: Secondary | ICD-10-CM | POA: Diagnosis not present

## 2022-02-06 DIAGNOSIS — M179 Osteoarthritis of knee, unspecified: Secondary | ICD-10-CM | POA: Diagnosis not present

## 2022-02-06 DIAGNOSIS — Z8781 Personal history of (healed) traumatic fracture: Secondary | ICD-10-CM | POA: Diagnosis not present

## 2022-02-06 DIAGNOSIS — Z7982 Long term (current) use of aspirin: Secondary | ICD-10-CM | POA: Diagnosis not present

## 2022-02-06 DIAGNOSIS — E785 Hyperlipidemia, unspecified: Secondary | ICD-10-CM | POA: Diagnosis not present

## 2022-02-08 DIAGNOSIS — M00062 Staphylococcal arthritis, left knee: Secondary | ICD-10-CM | POA: Diagnosis not present

## 2022-02-08 DIAGNOSIS — I63412 Cerebral infarction due to embolism of left middle cerebral artery: Secondary | ICD-10-CM | POA: Diagnosis not present

## 2022-02-08 DIAGNOSIS — R269 Unspecified abnormalities of gait and mobility: Secondary | ICD-10-CM | POA: Diagnosis not present

## 2022-02-09 DIAGNOSIS — I4891 Unspecified atrial fibrillation: Secondary | ICD-10-CM | POA: Diagnosis not present

## 2022-02-09 DIAGNOSIS — I69351 Hemiplegia and hemiparesis following cerebral infarction affecting right dominant side: Secondary | ICD-10-CM | POA: Diagnosis not present

## 2022-02-09 DIAGNOSIS — I10 Essential (primary) hypertension: Secondary | ICD-10-CM | POA: Diagnosis not present

## 2022-02-09 DIAGNOSIS — L89616 Pressure-induced deep tissue damage of right heel: Secondary | ICD-10-CM | POA: Diagnosis not present

## 2022-02-09 DIAGNOSIS — M179 Osteoarthritis of knee, unspecified: Secondary | ICD-10-CM | POA: Diagnosis not present

## 2022-02-09 DIAGNOSIS — I69391 Dysphagia following cerebral infarction: Secondary | ICD-10-CM | POA: Diagnosis not present

## 2022-02-09 DIAGNOSIS — I69320 Aphasia following cerebral infarction: Secondary | ICD-10-CM | POA: Diagnosis not present

## 2022-02-09 DIAGNOSIS — E785 Hyperlipidemia, unspecified: Secondary | ICD-10-CM | POA: Diagnosis not present

## 2022-02-09 DIAGNOSIS — D509 Iron deficiency anemia, unspecified: Secondary | ICD-10-CM | POA: Diagnosis not present

## 2022-02-09 DIAGNOSIS — K5900 Constipation, unspecified: Secondary | ICD-10-CM | POA: Diagnosis not present

## 2022-02-10 DIAGNOSIS — M179 Osteoarthritis of knee, unspecified: Secondary | ICD-10-CM | POA: Diagnosis not present

## 2022-02-10 DIAGNOSIS — I69351 Hemiplegia and hemiparesis following cerebral infarction affecting right dominant side: Secondary | ICD-10-CM | POA: Diagnosis not present

## 2022-02-10 DIAGNOSIS — I4891 Unspecified atrial fibrillation: Secondary | ICD-10-CM | POA: Diagnosis not present

## 2022-02-10 DIAGNOSIS — I69320 Aphasia following cerebral infarction: Secondary | ICD-10-CM | POA: Diagnosis not present

## 2022-02-10 DIAGNOSIS — Z8781 Personal history of (healed) traumatic fracture: Secondary | ICD-10-CM | POA: Diagnosis not present

## 2022-02-10 DIAGNOSIS — Z9181 History of falling: Secondary | ICD-10-CM | POA: Diagnosis not present

## 2022-02-10 DIAGNOSIS — I10 Essential (primary) hypertension: Secondary | ICD-10-CM | POA: Diagnosis not present

## 2022-02-10 DIAGNOSIS — K5900 Constipation, unspecified: Secondary | ICD-10-CM | POA: Diagnosis not present

## 2022-02-10 DIAGNOSIS — D509 Iron deficiency anemia, unspecified: Secondary | ICD-10-CM | POA: Diagnosis not present

## 2022-02-10 DIAGNOSIS — L89616 Pressure-induced deep tissue damage of right heel: Secondary | ICD-10-CM | POA: Diagnosis not present

## 2022-02-10 DIAGNOSIS — E785 Hyperlipidemia, unspecified: Secondary | ICD-10-CM | POA: Diagnosis not present

## 2022-02-10 DIAGNOSIS — Z87440 Personal history of urinary (tract) infections: Secondary | ICD-10-CM | POA: Diagnosis not present

## 2022-02-10 DIAGNOSIS — Z7982 Long term (current) use of aspirin: Secondary | ICD-10-CM | POA: Diagnosis not present

## 2022-02-10 DIAGNOSIS — I69391 Dysphagia following cerebral infarction: Secondary | ICD-10-CM | POA: Diagnosis not present

## 2022-02-11 DIAGNOSIS — L89610 Pressure ulcer of right heel, unstageable: Secondary | ICD-10-CM | POA: Diagnosis not present

## 2022-02-12 DIAGNOSIS — I4891 Unspecified atrial fibrillation: Secondary | ICD-10-CM | POA: Diagnosis not present

## 2022-02-12 DIAGNOSIS — Z8781 Personal history of (healed) traumatic fracture: Secondary | ICD-10-CM | POA: Diagnosis not present

## 2022-02-12 DIAGNOSIS — D509 Iron deficiency anemia, unspecified: Secondary | ICD-10-CM | POA: Diagnosis not present

## 2022-02-12 DIAGNOSIS — Z87440 Personal history of urinary (tract) infections: Secondary | ICD-10-CM | POA: Diagnosis not present

## 2022-02-12 DIAGNOSIS — I69351 Hemiplegia and hemiparesis following cerebral infarction affecting right dominant side: Secondary | ICD-10-CM | POA: Diagnosis not present

## 2022-02-12 DIAGNOSIS — I69320 Aphasia following cerebral infarction: Secondary | ICD-10-CM | POA: Diagnosis not present

## 2022-02-12 DIAGNOSIS — Z7982 Long term (current) use of aspirin: Secondary | ICD-10-CM | POA: Diagnosis not present

## 2022-02-12 DIAGNOSIS — K5900 Constipation, unspecified: Secondary | ICD-10-CM | POA: Diagnosis not present

## 2022-02-12 DIAGNOSIS — Z9181 History of falling: Secondary | ICD-10-CM | POA: Diagnosis not present

## 2022-02-12 DIAGNOSIS — I69391 Dysphagia following cerebral infarction: Secondary | ICD-10-CM | POA: Diagnosis not present

## 2022-02-12 DIAGNOSIS — M179 Osteoarthritis of knee, unspecified: Secondary | ICD-10-CM | POA: Diagnosis not present

## 2022-02-12 DIAGNOSIS — L89616 Pressure-induced deep tissue damage of right heel: Secondary | ICD-10-CM | POA: Diagnosis not present

## 2022-02-12 DIAGNOSIS — I10 Essential (primary) hypertension: Secondary | ICD-10-CM | POA: Diagnosis not present

## 2022-02-12 DIAGNOSIS — E785 Hyperlipidemia, unspecified: Secondary | ICD-10-CM | POA: Diagnosis not present

## 2022-02-16 DIAGNOSIS — I69391 Dysphagia following cerebral infarction: Secondary | ICD-10-CM | POA: Diagnosis not present

## 2022-02-16 DIAGNOSIS — E785 Hyperlipidemia, unspecified: Secondary | ICD-10-CM | POA: Diagnosis not present

## 2022-02-16 DIAGNOSIS — M179 Osteoarthritis of knee, unspecified: Secondary | ICD-10-CM | POA: Diagnosis not present

## 2022-02-16 DIAGNOSIS — K5900 Constipation, unspecified: Secondary | ICD-10-CM | POA: Diagnosis not present

## 2022-02-16 DIAGNOSIS — I10 Essential (primary) hypertension: Secondary | ICD-10-CM | POA: Diagnosis not present

## 2022-02-16 DIAGNOSIS — Z9181 History of falling: Secondary | ICD-10-CM | POA: Diagnosis not present

## 2022-02-16 DIAGNOSIS — L89616 Pressure-induced deep tissue damage of right heel: Secondary | ICD-10-CM | POA: Diagnosis not present

## 2022-02-16 DIAGNOSIS — Z7982 Long term (current) use of aspirin: Secondary | ICD-10-CM | POA: Diagnosis not present

## 2022-02-16 DIAGNOSIS — I69351 Hemiplegia and hemiparesis following cerebral infarction affecting right dominant side: Secondary | ICD-10-CM | POA: Diagnosis not present

## 2022-02-16 DIAGNOSIS — Z8781 Personal history of (healed) traumatic fracture: Secondary | ICD-10-CM | POA: Diagnosis not present

## 2022-02-16 DIAGNOSIS — Z87440 Personal history of urinary (tract) infections: Secondary | ICD-10-CM | POA: Diagnosis not present

## 2022-02-16 DIAGNOSIS — I69320 Aphasia following cerebral infarction: Secondary | ICD-10-CM | POA: Diagnosis not present

## 2022-02-16 DIAGNOSIS — D509 Iron deficiency anemia, unspecified: Secondary | ICD-10-CM | POA: Diagnosis not present

## 2022-02-16 DIAGNOSIS — I4891 Unspecified atrial fibrillation: Secondary | ICD-10-CM | POA: Diagnosis not present

## 2022-02-17 DIAGNOSIS — Z87440 Personal history of urinary (tract) infections: Secondary | ICD-10-CM | POA: Diagnosis not present

## 2022-02-17 DIAGNOSIS — I69351 Hemiplegia and hemiparesis following cerebral infarction affecting right dominant side: Secondary | ICD-10-CM | POA: Diagnosis not present

## 2022-02-17 DIAGNOSIS — Z9181 History of falling: Secondary | ICD-10-CM | POA: Diagnosis not present

## 2022-02-17 DIAGNOSIS — I10 Essential (primary) hypertension: Secondary | ICD-10-CM | POA: Diagnosis not present

## 2022-02-17 DIAGNOSIS — L89610 Pressure ulcer of right heel, unstageable: Secondary | ICD-10-CM | POA: Diagnosis not present

## 2022-02-17 DIAGNOSIS — K5900 Constipation, unspecified: Secondary | ICD-10-CM | POA: Diagnosis not present

## 2022-02-17 DIAGNOSIS — I69320 Aphasia following cerebral infarction: Secondary | ICD-10-CM | POA: Diagnosis not present

## 2022-02-17 DIAGNOSIS — M179 Osteoarthritis of knee, unspecified: Secondary | ICD-10-CM | POA: Diagnosis not present

## 2022-02-17 DIAGNOSIS — I4891 Unspecified atrial fibrillation: Secondary | ICD-10-CM | POA: Diagnosis not present

## 2022-02-17 DIAGNOSIS — I69391 Dysphagia following cerebral infarction: Secondary | ICD-10-CM | POA: Diagnosis not present

## 2022-02-17 DIAGNOSIS — Z7982 Long term (current) use of aspirin: Secondary | ICD-10-CM | POA: Diagnosis not present

## 2022-02-17 DIAGNOSIS — D509 Iron deficiency anemia, unspecified: Secondary | ICD-10-CM | POA: Diagnosis not present

## 2022-02-17 DIAGNOSIS — L89616 Pressure-induced deep tissue damage of right heel: Secondary | ICD-10-CM | POA: Diagnosis not present

## 2022-02-17 DIAGNOSIS — Z8781 Personal history of (healed) traumatic fracture: Secondary | ICD-10-CM | POA: Diagnosis not present

## 2022-02-17 DIAGNOSIS — E785 Hyperlipidemia, unspecified: Secondary | ICD-10-CM | POA: Diagnosis not present

## 2022-02-19 DIAGNOSIS — I69320 Aphasia following cerebral infarction: Secondary | ICD-10-CM | POA: Diagnosis not present

## 2022-02-19 DIAGNOSIS — I69391 Dysphagia following cerebral infarction: Secondary | ICD-10-CM | POA: Diagnosis not present

## 2022-02-19 DIAGNOSIS — M179 Osteoarthritis of knee, unspecified: Secondary | ICD-10-CM | POA: Diagnosis not present

## 2022-02-19 DIAGNOSIS — I4891 Unspecified atrial fibrillation: Secondary | ICD-10-CM | POA: Diagnosis not present

## 2022-02-19 DIAGNOSIS — L89616 Pressure-induced deep tissue damage of right heel: Secondary | ICD-10-CM | POA: Diagnosis not present

## 2022-02-19 DIAGNOSIS — Z8781 Personal history of (healed) traumatic fracture: Secondary | ICD-10-CM | POA: Diagnosis not present

## 2022-02-19 DIAGNOSIS — I69351 Hemiplegia and hemiparesis following cerebral infarction affecting right dominant side: Secondary | ICD-10-CM | POA: Diagnosis not present

## 2022-02-19 DIAGNOSIS — Z87440 Personal history of urinary (tract) infections: Secondary | ICD-10-CM | POA: Diagnosis not present

## 2022-02-19 DIAGNOSIS — I10 Essential (primary) hypertension: Secondary | ICD-10-CM | POA: Diagnosis not present

## 2022-02-19 DIAGNOSIS — Z9181 History of falling: Secondary | ICD-10-CM | POA: Diagnosis not present

## 2022-02-19 DIAGNOSIS — Z7982 Long term (current) use of aspirin: Secondary | ICD-10-CM | POA: Diagnosis not present

## 2022-02-19 DIAGNOSIS — K5900 Constipation, unspecified: Secondary | ICD-10-CM | POA: Diagnosis not present

## 2022-02-19 DIAGNOSIS — E785 Hyperlipidemia, unspecified: Secondary | ICD-10-CM | POA: Diagnosis not present

## 2022-02-19 DIAGNOSIS — D509 Iron deficiency anemia, unspecified: Secondary | ICD-10-CM | POA: Diagnosis not present

## 2022-02-23 DIAGNOSIS — I69391 Dysphagia following cerebral infarction: Secondary | ICD-10-CM | POA: Diagnosis not present

## 2022-02-23 DIAGNOSIS — E785 Hyperlipidemia, unspecified: Secondary | ICD-10-CM | POA: Diagnosis not present

## 2022-02-23 DIAGNOSIS — I69351 Hemiplegia and hemiparesis following cerebral infarction affecting right dominant side: Secondary | ICD-10-CM | POA: Diagnosis not present

## 2022-02-23 DIAGNOSIS — D509 Iron deficiency anemia, unspecified: Secondary | ICD-10-CM | POA: Diagnosis not present

## 2022-02-23 DIAGNOSIS — Z7982 Long term (current) use of aspirin: Secondary | ICD-10-CM | POA: Diagnosis not present

## 2022-02-23 DIAGNOSIS — L89616 Pressure-induced deep tissue damage of right heel: Secondary | ICD-10-CM | POA: Diagnosis not present

## 2022-02-23 DIAGNOSIS — Z9181 History of falling: Secondary | ICD-10-CM | POA: Diagnosis not present

## 2022-02-23 DIAGNOSIS — I69320 Aphasia following cerebral infarction: Secondary | ICD-10-CM | POA: Diagnosis not present

## 2022-02-23 DIAGNOSIS — M179 Osteoarthritis of knee, unspecified: Secondary | ICD-10-CM | POA: Diagnosis not present

## 2022-02-23 DIAGNOSIS — K5900 Constipation, unspecified: Secondary | ICD-10-CM | POA: Diagnosis not present

## 2022-02-23 DIAGNOSIS — I4891 Unspecified atrial fibrillation: Secondary | ICD-10-CM | POA: Diagnosis not present

## 2022-02-23 DIAGNOSIS — Z8781 Personal history of (healed) traumatic fracture: Secondary | ICD-10-CM | POA: Diagnosis not present

## 2022-02-23 DIAGNOSIS — Z87440 Personal history of urinary (tract) infections: Secondary | ICD-10-CM | POA: Diagnosis not present

## 2022-02-23 DIAGNOSIS — I10 Essential (primary) hypertension: Secondary | ICD-10-CM | POA: Diagnosis not present

## 2022-02-25 DIAGNOSIS — L89610 Pressure ulcer of right heel, unstageable: Secondary | ICD-10-CM | POA: Diagnosis not present

## 2022-02-27 DIAGNOSIS — I10 Essential (primary) hypertension: Secondary | ICD-10-CM | POA: Diagnosis not present

## 2022-02-27 DIAGNOSIS — I69391 Dysphagia following cerebral infarction: Secondary | ICD-10-CM | POA: Diagnosis not present

## 2022-02-27 DIAGNOSIS — E785 Hyperlipidemia, unspecified: Secondary | ICD-10-CM | POA: Diagnosis not present

## 2022-02-27 DIAGNOSIS — L89616 Pressure-induced deep tissue damage of right heel: Secondary | ICD-10-CM | POA: Diagnosis not present

## 2022-02-27 DIAGNOSIS — Z9181 History of falling: Secondary | ICD-10-CM | POA: Diagnosis not present

## 2022-02-27 DIAGNOSIS — Z7982 Long term (current) use of aspirin: Secondary | ICD-10-CM | POA: Diagnosis not present

## 2022-02-27 DIAGNOSIS — K5900 Constipation, unspecified: Secondary | ICD-10-CM | POA: Diagnosis not present

## 2022-02-27 DIAGNOSIS — I69320 Aphasia following cerebral infarction: Secondary | ICD-10-CM | POA: Diagnosis not present

## 2022-02-27 DIAGNOSIS — M179 Osteoarthritis of knee, unspecified: Secondary | ICD-10-CM | POA: Diagnosis not present

## 2022-02-27 DIAGNOSIS — Z87440 Personal history of urinary (tract) infections: Secondary | ICD-10-CM | POA: Diagnosis not present

## 2022-02-27 DIAGNOSIS — I69351 Hemiplegia and hemiparesis following cerebral infarction affecting right dominant side: Secondary | ICD-10-CM | POA: Diagnosis not present

## 2022-02-27 DIAGNOSIS — I4891 Unspecified atrial fibrillation: Secondary | ICD-10-CM | POA: Diagnosis not present

## 2022-02-27 DIAGNOSIS — Z8781 Personal history of (healed) traumatic fracture: Secondary | ICD-10-CM | POA: Diagnosis not present

## 2022-02-27 DIAGNOSIS — D509 Iron deficiency anemia, unspecified: Secondary | ICD-10-CM | POA: Diagnosis not present

## 2022-03-04 DIAGNOSIS — Z87440 Personal history of urinary (tract) infections: Secondary | ICD-10-CM | POA: Diagnosis not present

## 2022-03-04 DIAGNOSIS — D509 Iron deficiency anemia, unspecified: Secondary | ICD-10-CM | POA: Diagnosis not present

## 2022-03-04 DIAGNOSIS — L89616 Pressure-induced deep tissue damage of right heel: Secondary | ICD-10-CM | POA: Diagnosis not present

## 2022-03-04 DIAGNOSIS — Z7982 Long term (current) use of aspirin: Secondary | ICD-10-CM | POA: Diagnosis not present

## 2022-03-04 DIAGNOSIS — I69320 Aphasia following cerebral infarction: Secondary | ICD-10-CM | POA: Diagnosis not present

## 2022-03-04 DIAGNOSIS — I4891 Unspecified atrial fibrillation: Secondary | ICD-10-CM | POA: Diagnosis not present

## 2022-03-04 DIAGNOSIS — I69391 Dysphagia following cerebral infarction: Secondary | ICD-10-CM | POA: Diagnosis not present

## 2022-03-04 DIAGNOSIS — M179 Osteoarthritis of knee, unspecified: Secondary | ICD-10-CM | POA: Diagnosis not present

## 2022-03-04 DIAGNOSIS — I69351 Hemiplegia and hemiparesis following cerebral infarction affecting right dominant side: Secondary | ICD-10-CM | POA: Diagnosis not present

## 2022-03-04 DIAGNOSIS — Z9181 History of falling: Secondary | ICD-10-CM | POA: Diagnosis not present

## 2022-03-04 DIAGNOSIS — K5900 Constipation, unspecified: Secondary | ICD-10-CM | POA: Diagnosis not present

## 2022-03-04 DIAGNOSIS — I10 Essential (primary) hypertension: Secondary | ICD-10-CM | POA: Diagnosis not present

## 2022-03-04 DIAGNOSIS — Z8781 Personal history of (healed) traumatic fracture: Secondary | ICD-10-CM | POA: Diagnosis not present

## 2022-03-04 DIAGNOSIS — E785 Hyperlipidemia, unspecified: Secondary | ICD-10-CM | POA: Diagnosis not present

## 2022-03-05 DIAGNOSIS — I10 Essential (primary) hypertension: Secondary | ICD-10-CM | POA: Diagnosis not present

## 2022-03-05 DIAGNOSIS — I6990 Unspecified sequelae of unspecified cerebrovascular disease: Secondary | ICD-10-CM | POA: Diagnosis not present

## 2022-03-05 DIAGNOSIS — Z8673 Personal history of transient ischemic attack (TIA), and cerebral infarction without residual deficits: Secondary | ICD-10-CM | POA: Diagnosis not present

## 2022-03-05 DIAGNOSIS — M7989 Other specified soft tissue disorders: Secondary | ICD-10-CM | POA: Diagnosis not present

## 2022-03-05 DIAGNOSIS — L89610 Pressure ulcer of right heel, unstageable: Secondary | ICD-10-CM | POA: Diagnosis not present

## 2022-03-05 DIAGNOSIS — Z9889 Other specified postprocedural states: Secondary | ICD-10-CM | POA: Diagnosis not present

## 2022-03-06 DIAGNOSIS — M79604 Pain in right leg: Secondary | ICD-10-CM | POA: Diagnosis not present

## 2022-03-06 DIAGNOSIS — R2243 Localized swelling, mass and lump, lower limb, bilateral: Secondary | ICD-10-CM | POA: Diagnosis not present

## 2022-03-06 DIAGNOSIS — M79605 Pain in left leg: Secondary | ICD-10-CM | POA: Diagnosis not present

## 2022-03-11 DIAGNOSIS — R600 Localized edema: Secondary | ICD-10-CM | POA: Diagnosis not present

## 2022-03-11 DIAGNOSIS — I63412 Cerebral infarction due to embolism of left middle cerebral artery: Secondary | ICD-10-CM | POA: Diagnosis not present

## 2022-03-11 DIAGNOSIS — I10 Essential (primary) hypertension: Secondary | ICD-10-CM | POA: Diagnosis not present

## 2022-03-11 DIAGNOSIS — R269 Unspecified abnormalities of gait and mobility: Secondary | ICD-10-CM | POA: Diagnosis not present

## 2022-03-11 DIAGNOSIS — E876 Hypokalemia: Secondary | ICD-10-CM | POA: Diagnosis not present

## 2022-03-11 DIAGNOSIS — M00062 Staphylococcal arthritis, left knee: Secondary | ICD-10-CM | POA: Diagnosis not present

## 2022-03-12 DIAGNOSIS — I69351 Hemiplegia and hemiparesis following cerebral infarction affecting right dominant side: Secondary | ICD-10-CM | POA: Diagnosis not present

## 2022-03-12 DIAGNOSIS — E785 Hyperlipidemia, unspecified: Secondary | ICD-10-CM | POA: Diagnosis not present

## 2022-03-12 DIAGNOSIS — M179 Osteoarthritis of knee, unspecified: Secondary | ICD-10-CM | POA: Diagnosis not present

## 2022-03-12 DIAGNOSIS — L89616 Pressure-induced deep tissue damage of right heel: Secondary | ICD-10-CM | POA: Diagnosis not present

## 2022-03-12 DIAGNOSIS — I69391 Dysphagia following cerebral infarction: Secondary | ICD-10-CM | POA: Diagnosis not present

## 2022-03-12 DIAGNOSIS — Z7982 Long term (current) use of aspirin: Secondary | ICD-10-CM | POA: Diagnosis not present

## 2022-03-12 DIAGNOSIS — I69320 Aphasia following cerebral infarction: Secondary | ICD-10-CM | POA: Diagnosis not present

## 2022-03-12 DIAGNOSIS — Z87440 Personal history of urinary (tract) infections: Secondary | ICD-10-CM | POA: Diagnosis not present

## 2022-03-12 DIAGNOSIS — I4891 Unspecified atrial fibrillation: Secondary | ICD-10-CM | POA: Diagnosis not present

## 2022-03-12 DIAGNOSIS — I10 Essential (primary) hypertension: Secondary | ICD-10-CM | POA: Diagnosis not present

## 2022-03-12 DIAGNOSIS — K5900 Constipation, unspecified: Secondary | ICD-10-CM | POA: Diagnosis not present

## 2022-03-12 DIAGNOSIS — Z8781 Personal history of (healed) traumatic fracture: Secondary | ICD-10-CM | POA: Diagnosis not present

## 2022-03-12 DIAGNOSIS — D509 Iron deficiency anemia, unspecified: Secondary | ICD-10-CM | POA: Diagnosis not present

## 2022-03-12 DIAGNOSIS — Z9181 History of falling: Secondary | ICD-10-CM | POA: Diagnosis not present

## 2022-03-13 DIAGNOSIS — L89610 Pressure ulcer of right heel, unstageable: Secondary | ICD-10-CM | POA: Diagnosis not present

## 2022-03-13 DIAGNOSIS — R600 Localized edema: Secondary | ICD-10-CM | POA: Diagnosis not present

## 2022-03-16 DIAGNOSIS — E785 Hyperlipidemia, unspecified: Secondary | ICD-10-CM | POA: Diagnosis not present

## 2022-03-16 DIAGNOSIS — M179 Osteoarthritis of knee, unspecified: Secondary | ICD-10-CM | POA: Diagnosis not present

## 2022-03-16 DIAGNOSIS — K5900 Constipation, unspecified: Secondary | ICD-10-CM | POA: Diagnosis not present

## 2022-03-16 DIAGNOSIS — L89616 Pressure-induced deep tissue damage of right heel: Secondary | ICD-10-CM | POA: Diagnosis not present

## 2022-03-16 DIAGNOSIS — D509 Iron deficiency anemia, unspecified: Secondary | ICD-10-CM | POA: Diagnosis not present

## 2022-03-16 DIAGNOSIS — Z9181 History of falling: Secondary | ICD-10-CM | POA: Diagnosis not present

## 2022-03-16 DIAGNOSIS — I69351 Hemiplegia and hemiparesis following cerebral infarction affecting right dominant side: Secondary | ICD-10-CM | POA: Diagnosis not present

## 2022-03-16 DIAGNOSIS — Z87440 Personal history of urinary (tract) infections: Secondary | ICD-10-CM | POA: Diagnosis not present

## 2022-03-16 DIAGNOSIS — Z7982 Long term (current) use of aspirin: Secondary | ICD-10-CM | POA: Diagnosis not present

## 2022-03-16 DIAGNOSIS — I69391 Dysphagia following cerebral infarction: Secondary | ICD-10-CM | POA: Diagnosis not present

## 2022-03-16 DIAGNOSIS — I10 Essential (primary) hypertension: Secondary | ICD-10-CM | POA: Diagnosis not present

## 2022-03-16 DIAGNOSIS — Z8781 Personal history of (healed) traumatic fracture: Secondary | ICD-10-CM | POA: Diagnosis not present

## 2022-03-16 DIAGNOSIS — I69320 Aphasia following cerebral infarction: Secondary | ICD-10-CM | POA: Diagnosis not present

## 2022-03-16 DIAGNOSIS — I4891 Unspecified atrial fibrillation: Secondary | ICD-10-CM | POA: Diagnosis not present

## 2022-03-18 DIAGNOSIS — K5900 Constipation, unspecified: Secondary | ICD-10-CM | POA: Diagnosis not present

## 2022-03-18 DIAGNOSIS — L89616 Pressure-induced deep tissue damage of right heel: Secondary | ICD-10-CM | POA: Diagnosis not present

## 2022-03-18 DIAGNOSIS — I69391 Dysphagia following cerebral infarction: Secondary | ICD-10-CM | POA: Diagnosis not present

## 2022-03-18 DIAGNOSIS — Z8781 Personal history of (healed) traumatic fracture: Secondary | ICD-10-CM | POA: Diagnosis not present

## 2022-03-18 DIAGNOSIS — E785 Hyperlipidemia, unspecified: Secondary | ICD-10-CM | POA: Diagnosis not present

## 2022-03-18 DIAGNOSIS — D509 Iron deficiency anemia, unspecified: Secondary | ICD-10-CM | POA: Diagnosis not present

## 2022-03-18 DIAGNOSIS — I69320 Aphasia following cerebral infarction: Secondary | ICD-10-CM | POA: Diagnosis not present

## 2022-03-18 DIAGNOSIS — Z87440 Personal history of urinary (tract) infections: Secondary | ICD-10-CM | POA: Diagnosis not present

## 2022-03-18 DIAGNOSIS — I4891 Unspecified atrial fibrillation: Secondary | ICD-10-CM | POA: Diagnosis not present

## 2022-03-18 DIAGNOSIS — Z7982 Long term (current) use of aspirin: Secondary | ICD-10-CM | POA: Diagnosis not present

## 2022-03-18 DIAGNOSIS — I69351 Hemiplegia and hemiparesis following cerebral infarction affecting right dominant side: Secondary | ICD-10-CM | POA: Diagnosis not present

## 2022-03-18 DIAGNOSIS — I10 Essential (primary) hypertension: Secondary | ICD-10-CM | POA: Diagnosis not present

## 2022-03-18 DIAGNOSIS — Z9181 History of falling: Secondary | ICD-10-CM | POA: Diagnosis not present

## 2022-03-18 DIAGNOSIS — M179 Osteoarthritis of knee, unspecified: Secondary | ICD-10-CM | POA: Diagnosis not present

## 2022-03-25 DIAGNOSIS — K5900 Constipation, unspecified: Secondary | ICD-10-CM | POA: Diagnosis not present

## 2022-03-25 DIAGNOSIS — Z8781 Personal history of (healed) traumatic fracture: Secondary | ICD-10-CM | POA: Diagnosis not present

## 2022-03-25 DIAGNOSIS — I10 Essential (primary) hypertension: Secondary | ICD-10-CM | POA: Diagnosis not present

## 2022-03-25 DIAGNOSIS — M179 Osteoarthritis of knee, unspecified: Secondary | ICD-10-CM | POA: Diagnosis not present

## 2022-03-25 DIAGNOSIS — E785 Hyperlipidemia, unspecified: Secondary | ICD-10-CM | POA: Diagnosis not present

## 2022-03-25 DIAGNOSIS — I69351 Hemiplegia and hemiparesis following cerebral infarction affecting right dominant side: Secondary | ICD-10-CM | POA: Diagnosis not present

## 2022-03-25 DIAGNOSIS — Z87440 Personal history of urinary (tract) infections: Secondary | ICD-10-CM | POA: Diagnosis not present

## 2022-03-25 DIAGNOSIS — Z7982 Long term (current) use of aspirin: Secondary | ICD-10-CM | POA: Diagnosis not present

## 2022-03-25 DIAGNOSIS — D509 Iron deficiency anemia, unspecified: Secondary | ICD-10-CM | POA: Diagnosis not present

## 2022-03-25 DIAGNOSIS — I69320 Aphasia following cerebral infarction: Secondary | ICD-10-CM | POA: Diagnosis not present

## 2022-03-25 DIAGNOSIS — I69391 Dysphagia following cerebral infarction: Secondary | ICD-10-CM | POA: Diagnosis not present

## 2022-03-25 DIAGNOSIS — I4891 Unspecified atrial fibrillation: Secondary | ICD-10-CM | POA: Diagnosis not present

## 2022-03-25 DIAGNOSIS — Z9181 History of falling: Secondary | ICD-10-CM | POA: Diagnosis not present

## 2022-03-25 DIAGNOSIS — L89616 Pressure-induced deep tissue damage of right heel: Secondary | ICD-10-CM | POA: Diagnosis not present

## 2022-04-01 DIAGNOSIS — I4891 Unspecified atrial fibrillation: Secondary | ICD-10-CM | POA: Diagnosis not present

## 2022-04-01 DIAGNOSIS — L89616 Pressure-induced deep tissue damage of right heel: Secondary | ICD-10-CM | POA: Diagnosis not present

## 2022-04-01 DIAGNOSIS — Z9181 History of falling: Secondary | ICD-10-CM | POA: Diagnosis not present

## 2022-04-01 DIAGNOSIS — I69391 Dysphagia following cerebral infarction: Secondary | ICD-10-CM | POA: Diagnosis not present

## 2022-04-01 DIAGNOSIS — K5900 Constipation, unspecified: Secondary | ICD-10-CM | POA: Diagnosis not present

## 2022-04-01 DIAGNOSIS — I69351 Hemiplegia and hemiparesis following cerebral infarction affecting right dominant side: Secondary | ICD-10-CM | POA: Diagnosis not present

## 2022-04-01 DIAGNOSIS — D509 Iron deficiency anemia, unspecified: Secondary | ICD-10-CM | POA: Diagnosis not present

## 2022-04-01 DIAGNOSIS — I10 Essential (primary) hypertension: Secondary | ICD-10-CM | POA: Diagnosis not present

## 2022-04-01 DIAGNOSIS — Z8781 Personal history of (healed) traumatic fracture: Secondary | ICD-10-CM | POA: Diagnosis not present

## 2022-04-01 DIAGNOSIS — Z87440 Personal history of urinary (tract) infections: Secondary | ICD-10-CM | POA: Diagnosis not present

## 2022-04-01 DIAGNOSIS — M179 Osteoarthritis of knee, unspecified: Secondary | ICD-10-CM | POA: Diagnosis not present

## 2022-04-01 DIAGNOSIS — Z7982 Long term (current) use of aspirin: Secondary | ICD-10-CM | POA: Diagnosis not present

## 2022-04-01 DIAGNOSIS — I69320 Aphasia following cerebral infarction: Secondary | ICD-10-CM | POA: Diagnosis not present

## 2022-04-01 DIAGNOSIS — E785 Hyperlipidemia, unspecified: Secondary | ICD-10-CM | POA: Diagnosis not present

## 2022-04-03 DIAGNOSIS — Z7982 Long term (current) use of aspirin: Secondary | ICD-10-CM | POA: Diagnosis not present

## 2022-04-03 DIAGNOSIS — Z8781 Personal history of (healed) traumatic fracture: Secondary | ICD-10-CM | POA: Diagnosis not present

## 2022-04-03 DIAGNOSIS — L89616 Pressure-induced deep tissue damage of right heel: Secondary | ICD-10-CM | POA: Diagnosis not present

## 2022-04-03 DIAGNOSIS — M179 Osteoarthritis of knee, unspecified: Secondary | ICD-10-CM | POA: Diagnosis not present

## 2022-04-03 DIAGNOSIS — I69351 Hemiplegia and hemiparesis following cerebral infarction affecting right dominant side: Secondary | ICD-10-CM | POA: Diagnosis not present

## 2022-04-03 DIAGNOSIS — K5900 Constipation, unspecified: Secondary | ICD-10-CM | POA: Diagnosis not present

## 2022-04-03 DIAGNOSIS — I10 Essential (primary) hypertension: Secondary | ICD-10-CM | POA: Diagnosis not present

## 2022-04-03 DIAGNOSIS — I69391 Dysphagia following cerebral infarction: Secondary | ICD-10-CM | POA: Diagnosis not present

## 2022-04-03 DIAGNOSIS — Z87440 Personal history of urinary (tract) infections: Secondary | ICD-10-CM | POA: Diagnosis not present

## 2022-04-03 DIAGNOSIS — I69320 Aphasia following cerebral infarction: Secondary | ICD-10-CM | POA: Diagnosis not present

## 2022-04-03 DIAGNOSIS — I4891 Unspecified atrial fibrillation: Secondary | ICD-10-CM | POA: Diagnosis not present

## 2022-04-03 DIAGNOSIS — Z9181 History of falling: Secondary | ICD-10-CM | POA: Diagnosis not present

## 2022-04-03 DIAGNOSIS — D509 Iron deficiency anemia, unspecified: Secondary | ICD-10-CM | POA: Diagnosis not present

## 2022-04-03 DIAGNOSIS — E785 Hyperlipidemia, unspecified: Secondary | ICD-10-CM | POA: Diagnosis not present

## 2022-04-06 ENCOUNTER — Telehealth: Payer: Self-pay | Admitting: *Deleted

## 2022-04-06 NOTE — Progress Notes (Signed)
  Care Coordination  Outreach Note  04/06/2022 Name: Debra Klein MRN: [id number] DOB: 01/29/1975   Care Coordination Outreach Attempts: An unsuccessful telephone outreach was attempted today to offer the patient information about available care coordination services as a benefit of their health plan.   Follow Up Plan:  Additional outreach attempts will be made to offer the patient care coordination information and services.   Encounter Outcome:  No Answer  Julian Hy, [place] Direct Dial: [phone]

## 2022-04-06 NOTE — Progress Notes (Signed)
  Care Coordination   Note   04/06/2022 Name: Sana Keesee MRN: [id number] DOB: Mar 04, 1975  Elenita Rozon is a 47 y.o. year old female who sees Gaynelle Arabian, MD for primary care. I reached out to Annett Gula by phone today to offer care coordination services.  Ms. Cid was given information about Care Coordination services today including:   The Care Coordination services include support from the care team which includes your Nurse Coordinator, Clinical Social Worker, or Pharmacist.  The Care Coordination team is here to help remove barriers to the health concerns and goals most important to you. Care Coordination services are voluntary, and the patient may decline or stop services at any time by request to their care team member.   Care Coordination Consent Status: Patient agreed to services and verbal consent obtained.   Follow up plan:  Telephone appointment with care coordination team member scheduled for:  04/15/2022  Encounter Outcome:  Pt. Scheduled  Julian Hy, [place] Direct Dial: [phone]

## 2022-04-07 ENCOUNTER — Ambulatory Visit: Payer: 59 | Attending: Family Medicine | Admitting: Physical Therapy

## 2022-04-07 ENCOUNTER — Other Ambulatory Visit: Payer: Self-pay

## 2022-04-07 ENCOUNTER — Encounter: Payer: Self-pay | Admitting: Physical Therapy

## 2022-04-07 VITALS — BP 180/94 | HR 55

## 2022-04-07 DIAGNOSIS — R2681 Unsteadiness on feet: Secondary | ICD-10-CM | POA: Diagnosis not present

## 2022-04-07 DIAGNOSIS — R2689 Other abnormalities of gait and mobility: Secondary | ICD-10-CM | POA: Insufficient documentation

## 2022-04-07 DIAGNOSIS — M6281 Muscle weakness (generalized): Secondary | ICD-10-CM | POA: Diagnosis not present

## 2022-04-07 NOTE — Therapy (Unsigned)
OUTPATIENT PHYSICAL THERAPY NEURO EVALUATION   Patient Name: Debra Klein MRN: [id number] DOB:11/25/1975, 47 y.o., female Today's Date: 04/07/2022   PCP: Gaynelle Arabian, MD REFERRING PROVIDER: Collene Leyden, MD  END OF SESSION:  PT End of Session - 04/07/22 1115     Visit Number 1    Number of Visits 5   4 + eval   Authorization Type [employer]    PT Start Time 1101    PT Stop Time 1148   Time interrupted by fire drill and pt needing to use restroom   PT Time Calculation (min) 47 min    Equipment Utilized During Treatment Gait belt    Activity Tolerance Patient limited by lethargy   pt sleeps w/ head against wall as mother answers questions.   Behavior During Therapy Flat affect             Past Medical History:  Diagnosis Date   CVA (cerebral vascular accident) ([place])    Hyperlipemia    Hypertension    IDA (iron deficiency anemia)    Schizophrenia ([place])    Past Surgical History:  Procedure Laterality Date   KNEE ARTHROSCOPY Left 06/24/2021   Procedure: ARTHROSCOPY LEFT KNEE IRRIGRATION AND [place] AND SYNOVECTOMY;  Surgeon: Mcarthur Rossetti, MD;  Location: [place] [facility];  Service: Orthopedics;  Laterality: Left;   MITRAL VALVE REPAIR N/A 06/27/2021   Procedure: MITRAL VALVE REPAIR USING 28MM SIMUFORM SEMI-RIGID ANNULOPLASTY RING;  Surgeon: Melrose Nakayama, MD;  Location: [place];  Service: Open Heart Surgery;  Laterality: N/A;   No previous surgery     TEE WITHOUT CARDIOVERSION N/A 06/27/2021   Procedure: TRANSESOPHAGEAL ECHOCARDIOGRAM (TEE);  Surgeon: Melrose Nakayama, MD;  Location: [facility];  Service: Open Heart Surgery;  Laterality: N/A;   Patient Active Problem List   Diagnosis Date Noted   Acute encephalopathy 11/19/2021   Hypomagnesemia 11/19/2021   Acute cystitis 11/19/2021   QT prolongation 11/19/2021   Acute bacterial endocarditis    Pressure injury of skin 06/28/2021   S/P mitral valve repair 06/27/2021   Septic arthritis of  knee, left ([place]) 06/21/2021   Bacteremia due to methicillin susceptible Staphylococcus aureus (MSSA) 06/20/2021   Toxic encephalopathy 06/20/2021   Cerebrovascular accident (CVA) due to embolic occlusion of left middle cerebral artery ([place]) 06/20/2021   Acute respiratory failure with hypoxia ([place]) 06/20/2021   Essential hypertension 06/19/2021   HLD (hyperlipidemia) 06/19/2021   Chronic iron deficiency anemia 06/19/2021   Prediabetes 06/19/2021   Schizophrenia ([place]) 06/19/2021   Sepsis ([place]) 06/19/2021   Lactic acidosis 06/19/2021   Transaminitis 06/19/2021   AKI (acute kidney injury) ([place]) 06/19/2021   Hypokalemia 06/19/2021    ONSET DATE: 04/02/2022  REFERRING DIAG: [id number] (ICD-10-CM) - Personal history of transient ischemic attack (TIA), and cerebral infarction without residual deficits  THERAPY DIAG:  Muscle weakness (generalized)  Unsteadiness on feet  Other abnormalities of gait and mobility  Rationale for Evaluation and Treatment: Rehabilitation  SUBJECTIVE:  SUBJECTIVE STATEMENT: Mother reports patient has a fear of going up and down stairs.  This has made it difficult for patient to enter and exit home, but does not interfere with living on the first floor in her home.   Pt accompanied by: family member-Mother [name]  PERTINENT HISTORY: HTN, HLD, Schizophrenia, embolic left MCA CVA [id number] w/o intervention, s/p mitral valve repair  PAIN:  Are you having pain? Yes: NPRS scale: Pt unable to rate/10 Pain location: left leg Pain description: "it hurts" unable to describe beyond this Aggravating factors: unsure Relieving factors: unsure  PRECAUTIONS: {Therapy precautions:24002}  WEIGHT BEARING RESTRICTIONS: {Yes ***/No:24003}  FALLS: Has patient fallen in last 6 months?  No  LIVING ENVIRONMENT: Lives with: lives with their family Lives in: House/apartment Stairs: Yes: Internal: split level steps; switches from left to right going up and External: 6 steps; on left going up Has following equipment at home: Single point cane, Walker - 2 wheeled, Wheelchair (manual), Shower bench, bed side commode, and Grab bars  PLOF: Independent with household mobility with device, Needs assistance with ADLs, Needs assistance with homemaking, and Needs assistance with transfers  PATIENT GOALS: pt unsure  OBJECTIVE:   DIAGNOSTIC FINDINGS: ***  COGNITION: Overall cognitive status: History of cognitive impairments - at baseline   SENSATION: WFL  COORDINATION: No issues noted during ambulatory tasks  EDEMA:  Baseline in BLE, wears compression stockings, BLE are firm, MD is aware and managing ongoing issue.  MUSCLE TONE: not formally assessed  POSTURE: No Significant postural limitations  LOWER EXTREMITY ROM:     Active  Right Eval Left Eval  Hip flexion Grossly WFL  Hip extension   Hip abduction   Hip adduction   Hip internal rotation   Hip external rotation   Knee flexion   Knee extension   Ankle dorsiflexion   Ankle plantarflexion Limited ~50% WFL  Ankle inversion    Ankle eversion     (Blank rows = not tested)  LOWER EXTREMITY MMT:    MMT Right Eval Left Eval  Hip flexion    Hip extension    Hip abduction    Hip adduction    Hip internal rotation    Hip external rotation    Knee flexion    Knee extension    Ankle dorsiflexion    Ankle plantarflexion    Ankle inversion    Ankle eversion    (Blank rows = not tested)  BED MOBILITY:  {Bed mobility:24027}  TRANSFERS: Assistive device utilized: Environmental consultant - 2 wheeled  Sit to stand: SBA Stand to sit: SBA Chair to chair: SBA  STAIRS: Level of Assistance: CGA Stair Negotiation Technique: Step to Pattern Forwards with Bilateral Rails Number of Stairs: 4  Height of Stairs:  6"  Comments: Pt is unwilling to initiate stairs w/ LLE leading stating she cannot despite being able to lift LE to first step w/o issue.  She is able to perform w/ RLE leading on ascent and LLE leading on descent.  GAIT: Gait pattern: decreased stride length, decreased hip/knee flexion- Right, decreased hip/knee flexion- Left, decreased ankle dorsiflexion- Right, decreased ankle dorsiflexion- Left, narrow BOS, poor foot clearance- Right, and poor foot clearance- Left Distance walked: various clinic distances including out into parking lot during fire drill. Assistive device utilized: Environmental consultant - 2 wheeled Level of assistance: SBA Comments: Pt ambulates w/ slight RLE in-toeing, no noted toe drag.  FUNCTIONAL TESTS:  5 times sit to stand: 21.78seconds w/ BUE support and cues for upright posture  PATIENT SURVEYS:  FOTO N/A due to chronicity of CVA  TODAY'S TREATMENT:                                                                                                                              DATE: N/A   PATIENT EDUCATION: Education details: *** Person educated: {Person educated:25204} Education method: {Education Method:25205} Education comprehension: {Education Comprehension:25206}  HOME EXERCISE PROGRAM: ***  GOALS: Goals reviewed with patient? {yes/no:20286}  SHORT TERM GOALS: Target date: ***  *** Baseline: Goal status: {GOALSTATUS:25110}  2.  *** Baseline:  Goal status: {GOALSTATUS:25110}  3.  *** Baseline:  Goal status: {GOALSTATUS:25110}  4.  *** Baseline:  Goal status: {GOALSTATUS:25110}  5.  *** Baseline:  Goal status: {GOALSTATUS:25110}  6.  *** Baseline:  Goal status: {GOALSTATUS:25110}  LONG TERM GOALS: Target date: ***  *** Baseline:  Goal status: {GOALSTATUS:25110}  2.  *** Baseline:  Goal status: {GOALSTATUS:25110}  3.  *** Baseline:  Goal status: {GOALSTATUS:25110}  4.  *** Baseline:  Goal status: {GOALSTATUS:25110}  5.   *** Baseline:  Goal status: {GOALSTATUS:25110}  6.  *** Baseline:  Goal status: {GOALSTATUS:25110}  ASSESSMENT:  CLINICAL IMPRESSION: Patient is a *** y.o. *** who was seen today for physical therapy evaluation and treatment for ***.   OBJECTIVE IMPAIRMENTS: {opptimpairments:25111}.   ACTIVITY LIMITATIONS: {activitylimitations:27494}  PARTICIPATION LIMITATIONS: {participationrestrictions:25113}  PERSONAL FACTORS: {Personal factors:25162} are also affecting patient's functional outcome.   REHAB POTENTIAL: {rehabpotential:25112}  CLINICAL DECISION MAKING: {clinical decision making:25114}  EVALUATION COMPLEXITY: {Evaluation complexity:25115}  PLAN:  PT FREQUENCY: 1x/week  PT DURATION: 4 weeks  PLANNED INTERVENTIONS: {rehab planned interventions:25118::"Therapeutic exercises","Therapeutic activity","Neuromuscular re-education","Balance training","Gait training","Patient/Family education","Self Care","Joint mobilization"}  PLAN FOR NEXT SESSION: ***Initiate HEP for functional strength of BLE-STS working on dec hand support per request of mom, gait training w/ RW, and stair training due to pt apprehension.   Bary Richard, PT, DPT 04/07/2022, 11:49 AM

## 2022-04-08 DIAGNOSIS — I10 Essential (primary) hypertension: Secondary | ICD-10-CM | POA: Diagnosis not present

## 2022-04-09 DIAGNOSIS — D509 Iron deficiency anemia, unspecified: Secondary | ICD-10-CM | POA: Diagnosis not present

## 2022-04-09 DIAGNOSIS — R269 Unspecified abnormalities of gait and mobility: Secondary | ICD-10-CM | POA: Diagnosis not present

## 2022-04-09 DIAGNOSIS — Z9181 History of falling: Secondary | ICD-10-CM | POA: Diagnosis not present

## 2022-04-09 DIAGNOSIS — Z7982 Long term (current) use of aspirin: Secondary | ICD-10-CM | POA: Diagnosis not present

## 2022-04-09 DIAGNOSIS — M179 Osteoarthritis of knee, unspecified: Secondary | ICD-10-CM | POA: Diagnosis not present

## 2022-04-09 DIAGNOSIS — I69320 Aphasia following cerebral infarction: Secondary | ICD-10-CM | POA: Diagnosis not present

## 2022-04-09 DIAGNOSIS — I63412 Cerebral infarction due to embolism of left middle cerebral artery: Secondary | ICD-10-CM | POA: Diagnosis not present

## 2022-04-09 DIAGNOSIS — I4891 Unspecified atrial fibrillation: Secondary | ICD-10-CM | POA: Diagnosis not present

## 2022-04-09 DIAGNOSIS — L89616 Pressure-induced deep tissue damage of right heel: Secondary | ICD-10-CM | POA: Diagnosis not present

## 2022-04-09 DIAGNOSIS — I69391 Dysphagia following cerebral infarction: Secondary | ICD-10-CM | POA: Diagnosis not present

## 2022-04-09 DIAGNOSIS — Z8781 Personal history of (healed) traumatic fracture: Secondary | ICD-10-CM | POA: Diagnosis not present

## 2022-04-09 DIAGNOSIS — I10 Essential (primary) hypertension: Secondary | ICD-10-CM | POA: Diagnosis not present

## 2022-04-09 DIAGNOSIS — I69351 Hemiplegia and hemiparesis following cerebral infarction affecting right dominant side: Secondary | ICD-10-CM | POA: Diagnosis not present

## 2022-04-09 DIAGNOSIS — E785 Hyperlipidemia, unspecified: Secondary | ICD-10-CM | POA: Diagnosis not present

## 2022-04-09 DIAGNOSIS — M00062 Staphylococcal arthritis, left knee: Secondary | ICD-10-CM | POA: Diagnosis not present

## 2022-04-09 DIAGNOSIS — Z87440 Personal history of urinary (tract) infections: Secondary | ICD-10-CM | POA: Diagnosis not present

## 2022-04-09 DIAGNOSIS — K5900 Constipation, unspecified: Secondary | ICD-10-CM | POA: Diagnosis not present

## 2022-04-14 ENCOUNTER — Encounter: Payer: Self-pay | Admitting: Physical Therapy

## 2022-04-14 ENCOUNTER — Ambulatory Visit: Payer: 59 | Attending: Family Medicine | Admitting: Physical Therapy

## 2022-04-14 DIAGNOSIS — R2681 Unsteadiness on feet: Secondary | ICD-10-CM

## 2022-04-14 DIAGNOSIS — R2689 Other abnormalities of gait and mobility: Secondary | ICD-10-CM | POA: Diagnosis not present

## 2022-04-14 DIAGNOSIS — M6281 Muscle weakness (generalized): Secondary | ICD-10-CM | POA: Diagnosis not present

## 2022-04-14 NOTE — Therapy (Signed)
OUTPATIENT PHYSICAL THERAPY NEURO TREATMENT   Patient Name: Debra Klein MRN: [id number] DOB:[birth year], 47 y.o., female Today's Date: 04/14/2022   PCP: Gaynelle Arabian, MD REFERRING PROVIDER: Collene Leyden, MD  END OF SESSION:  PT End of Session - 04/14/22 1027     Visit Number 2    Number of Visits 5   4 + eval   Date for PT Re-Evaluation 05/08/22    Authorization Type [employer]IC[employer]    PT Start Time 1022   PT ran over w/ patient prior   PT Stop Time 1106    PT Time Calculation (min) 44 min    Equipment Utilized During Treatment Gait belt    Activity Tolerance Patient tolerated treatment well   pt sleeps w/ head against wall as mother answers questions.   Behavior During Therapy Restless              Past Medical History:  Diagnosis Date   CVA (cerebral vascular accident)    Hyperlipemia    Hypertension    IDA (iron deficiency anemia)    Schizophrenia    Past Surgical History:  Procedure Laterality Date   KNEE ARTHROSCOPY Left 06/24/2021   Procedure: ARTHROSCOPY LEFT KNEE IRRIGRATION AND [place] AND SYNOVECTOMY;  Surgeon: Mcarthur Rossetti, MD;  Location: [facility];  Service: Orthopedics;  Laterality: Left;   MITRAL VALVE REPAIR N/A 06/27/2021   Procedure: MITRAL VALVE REPAIR USING 28MM SIMUFORM SEMI-RIGID ANNULOPLASTY RING;  Surgeon: Melrose Nakayama, MD;  Location: [facility];  Service: Open Heart Surgery;  Laterality: N/A;   No previous surgery     TEE WITHOUT CARDIOVERSION N/A 06/27/2021   Procedure: TRANSESOPHAGEAL ECHOCARDIOGRAM (TEE);  Surgeon: Melrose Nakayama, MD;  Location: [facility];  Service: Open Heart Surgery;  Laterality: N/A;   Patient Active Problem List   Diagnosis Date Noted   Acute encephalopathy 11/19/2021   Hypomagnesemia 11/19/2021   Acute cystitis 11/19/2021   QT prolongation 11/19/2021   Acute bacterial endocarditis    Pressure injury of skin 06/28/2021   S/P mitral valve repair 06/27/2021   Septic arthritis of knee,  left 06/21/2021   Bacteremia due to methicillin susceptible Staphylococcus aureus (MSSA) 06/20/2021   Toxic encephalopathy 06/20/2021   Cerebrovascular accident (CVA) due to embolic occlusion of left middle cerebral artery 06/20/2021   Acute respiratory failure with hypoxia 06/20/2021   Essential hypertension 06/19/2021   HLD (hyperlipidemia) 06/19/2021   Chronic iron deficiency anemia 06/19/2021   Prediabetes 06/19/2021   Schizophrenia 06/19/2021   Sepsis 06/19/2021   Lactic acidosis 06/19/2021   Transaminitis 06/19/2021   AKI (acute kidney injury) 06/19/2021   Hypokalemia 06/19/2021    ONSET DATE: 04/02/2022  REFERRING DIAG: [id number] (ICD-10-CM) - Personal history of transient ischemic attack (TIA), and cerebral infarction without residual deficits  THERAPY DIAG:  Muscle weakness (generalized)  Unsteadiness on feet  Other abnormalities of gait and mobility  Rationale for Evaluation and Treatment: Rehabilitation  SUBJECTIVE:  SUBJECTIVE STATEMENT: Patient states her legs are still swollen and her knee hurt.  She states her mom has an appt for her to have her cough addressed. Pt accompanied by: family member-Mother [name]  PERTINENT HISTORY: HTN, HLD, Schizophrenia, embolic left MCA CVA [id number] w/o intervention, s/p mitral valve repair  PAIN:  Are you having pain? Yes: NPRS scale: 7/10 Pain location: bilateral knees Pain description: achy Aggravating factors: unsure Relieving factors: unsure  PRECAUTIONS: Fall  WEIGHT BEARING RESTRICTIONS: No  FALLS: Has patient fallen in last 6 months? No  LIVING ENVIRONMENT: Lives with: lives with their family Lives in: House/apartment Stairs: Yes: Internal: split level steps; switches from left to right going up and External: 6 steps; on left  going up Has following equipment at home: Single point cane, Walker - 2 wheeled, Wheelchair (manual), Shower bench, bed side commode, and Grab bars  PLOF: Independent with household mobility with device, Needs assistance with ADLs, Needs assistance with homemaking, and Needs assistance with transfers  PATIENT GOALS: pt unsure  OBJECTIVE:   DIAGNOSTIC FINDINGS:  Brain MRI w/o contrast   COGNITION: Overall cognitive status: History of cognitive impairments - at baseline   SENSATION: WFL  COORDINATION: No issues noted during ambulatory tasks  EDEMA:  Baseline in BLE, wears compression stockings, BLE are firm, MD is aware and managing ongoing issue.  MUSCLE TONE: not formally assessed  POSTURE: No Significant postural limitations  LOWER EXTREMITY ROM:     Active  Right Eval Left Eval  Hip flexion Grossly WFL  Hip extension   Hip abduction   Hip adduction   Hip internal rotation   Hip external rotation   Knee flexion   Knee extension   Ankle dorsiflexion   Ankle plantarflexion Limited ~50% WFL  Ankle inversion    Ankle eversion     (Blank rows = not tested)  LOWER EXTREMITY MMT:    MMT Right Eval Left Eval  Hip flexion Able to functionally ambulate w/ AD.  Hip extension   Hip abduction   Hip adduction   Hip internal rotation   Hip external rotation   Knee flexion   Knee extension   Ankle dorsiflexion   Ankle plantarflexion   Ankle inversion    Ankle eversion    (Blank rows = not tested)  BED MOBILITY:  Sit to supine SBA Supine to sit SBA  TRANSFERS: Assistive device utilized: Environmental consultant - 2 wheeled  Sit to stand: SBA Stand to sit: SBA Chair to chair: SBA  STAIRS: Level of Assistance: CGA Stair Negotiation Technique: Step to Pattern Forwards with Bilateral Rails Number of Stairs: 4  Height of Stairs: 6"  Comments: Pt is unwilling to initiate stairs w/ LLE leading stating she cannot despite being able to lift LE to first step w/o issue.  She is  able to perform w/ RLE leading on ascent and LLE leading on descent.  GAIT: Gait pattern: decreased stride length, decreased hip/knee flexion- Right, decreased hip/knee flexion- Left, decreased ankle dorsiflexion- Right, decreased ankle dorsiflexion- Left, narrow BOS, poor foot clearance- Right, and poor foot clearance- Left Distance walked: various clinic distances including out into parking lot during fire drill. Assistive device utilized: Environmental consultant - 2 wheeled Level of assistance: SBA Comments: Pt ambulates w/ slight RLE in-toeing, no noted toe drag.  FUNCTIONAL TESTS:  5 times sit to stand: 21.78seconds w/ BUE support and cues for upright posture  PATIENT SURVEYS:  FOTO N/A due to chronicity of CVA  TODAY'S TREATMENT:  DATE: 04/14/2022 -STS BUE support x5 > STS RUE support w/ LUE on RW x5 > hands on knees w/ pt stalling on attempt to push up stating she needs her hands -Trialed various hand positions to reduce UE support w/ stand w/o success; when trialing rocking to increased anterior weight shifting pt stalls on 3rd rock w/o LE engagement noted, max varied multimodal cuing to increase participation and decrease perseveration on "I can't do it".  Pt discouraged from pulling on back of chair and RW to stand as she attempts when trialing reach for anterior oriented chair back.  Instructed patient to practice single UE support at home. -Supine bridges x15, x10, increased multimodal cuing to improve form and pace on second bout -Seated heel raises 2x12 -Pt has prolonged spell of coughing, cues for deep breathing to improve airway clearance.  Pt refuses offer of mask, hand sanitizer, or liquids.  GAIT: Gait pattern: step through pattern, decreased stride length, decreased hip/knee flexion- Right, decreased hip/knee flexion- Left, decreased ankle dorsiflexion- Right, decreased  ankle dorsiflexion- Left, Right foot flat, Left foot flat, trunk flexed, and wide BOS Distance walked: 210' Assistive device utilized: Environmental consultant - 2 wheeled Level of assistance: SBA Comments: Pt requires significant cues for path finding and spatial awareness.  She requires encouragement for continuous gait.  She requires single standing rest break for water break.  Pt unable to modify pattern with cuing likely due in part to difficulty with sustained attention to task as well as severe LE edema.  She did respond to and maintain corrections for approximation to AD.  STAIRS:  Level of Assistance: CGA  Stair Negotiation Technique: Step to Pattern Alternating Pattern  Forwards with Bilateral Rails  Number of Stairs: 4   Height of Stairs: 6"  Comments: Pt requires facilitation of upright trunk w/ anterior shift of pelvis vs pulling from crouched position.  Cues for foot clearance on descent.   PATIENT EDUCATION: Education details: Gait deviations and safety w/ RW management, initial HEP, hygiene w/ ongoing cough, and safety w/ stair management especially on descent.  Person educated: Patient and Parent Education method: Explanation Education comprehension: verbalized understanding and needs further education  HOME EXERCISE PROGRAM: To be established.  GOALS: Goals reviewed with patient? Yes  SHORT TERM GOALS = LONG TERM GOALS: Target date: 05/08/2022  Pt will be compliant with finalized strength and balance HEP with supervision from family. Baseline: To be established. Goal status: INITIAL  2.  Pt will decrease 5xSTS to </=15 seconds in order to demonstrate decreased risk for falls and improved functional bilateral LE strength and power. Baseline: 21.78 seconds w/ BUE support Goal status: INITIAL  3.  Pt will manage 6 stairs reciprocally w/ left rail only at no more than SBA to promote improved function in home environment. Baseline: 4 stairs CGA bilateral rails step-to pattern Goal  status: INITIAL  ASSESSMENT:  CLINICAL IMPRESSION: Focus of skilled session today on initiating a functional strengthening home exercise program.  Patient has ongoing fear of attempting stands without using hand support which PT was unable to surpass this visit using multiple strategies to facilitate this progression.  She tolerates stairs better this visit with ongoing need for CGA and cuing for safety.  Her ambulation remains unchanged in part due to difficulty with sustained attention to task noted throughout session in addition to ongoing severe edema in BLE.  She continues to benefit from skilled PT to address mechanics of gait and improved independence with functional mobility and stair management.  OBJECTIVE IMPAIRMENTS: Abnormal gait, decreased activity tolerance, decreased balance, decreased knowledge of condition, decreased knowledge of use of DME, difficulty walking, increased edema, improper body mechanics, and pain.   ACTIVITY LIMITATIONS: carrying, lifting, bending, squatting, stairs, transfers, bed mobility, bathing, dressing, hygiene/grooming, locomotion level, and caring for others  PARTICIPATION LIMITATIONS: meal prep, cleaning, laundry, medication management, personal finances, interpersonal relationship, driving, shopping, community activity, and occupation  PERSONAL FACTORS: Age, Behavior pattern, Fitness, Past/current experiences, Sex, Time since onset of injury/illness/exacerbation, Transportation, and 3+ comorbidities: HTN, HLD, prior CVA, and Schizophrenia  are also affecting patient's functional outcome.   REHAB POTENTIAL: Good  CLINICAL DECISION MAKING: Evolving/moderate complexity  EVALUATION COMPLEXITY: Moderate  PLAN:  PT FREQUENCY: 1x/week  PT DURATION: 4 weeks  PLANNED INTERVENTIONS: Therapeutic exercises, Therapeutic activity, Neuromuscular re-education, Balance training, Gait training, Patient/Family education, Self Care, Stair training, Vestibular  training, DME instructions, Manual therapy, and Re-evaluation  PLAN FOR NEXT SESSION: Modify HEP prn for functional strength of BLE-STS working on dec hand support as able-try pull to stand in bars progressing to no support?, gait training w/ RW, and stair training due to pt apprehension.  Standing marches, hip abductor strength.   Bary Richard, PT, DPT 04/14/2022, 5:15 PM

## 2022-04-15 ENCOUNTER — Ambulatory Visit: Payer: Self-pay

## 2022-04-15 NOTE — Patient Outreach (Signed)
  Care Coordination   Initial Visit Note   04/15/2022 Name: Debra Klein MRN: [id number] DOB: 1975/11/29  Debra Klein is a 47 y.o. year old female who sees Gaynelle Arabian, MD for primary care. I spoke with  Debra Klein by phone today. Also spoke with Debra Klein DPR  What matters to the patients health and wellness today?  States she is doing OK other than she has had a cough that will not go away. States that her B/P was up to 153/103 this morning but it was 143/96 just now.  States she has been taking cough medicine for her cough.  Debra states she also had pizza yesterday.  States she is currently going to outpt therapy.      Goals Addressed             This Visit's Progress    Care Coordination Activities- self management of hypertension       Interventions Today    Flowsheet Row Most Recent Value  Chronic Disease   Chronic disease during today's visit Hypertension (HTN), Other  [CVA]  General Interventions   General Interventions Discussed/Reviewed General Interventions Discussed, Durable Medical Equipment (DME), Doctor Visits  Doctor Visits Discussed/Reviewed Doctor Visits Discussed, Annual Wellness Visits, PCP  [Discussed to call PCP to have cough and B/P checked]  Durable Medical Equipment (DME) BP Cuff  [Discussed to take her B/P cuff with her to her MD appt to check it for accuracy]  PCP/Specialist Visits Compliance with follow-up visit  Education Interventions   Education Provided Provided Education  Provided Verbal Education On When to see the doctor, Other, Nutrition  [Discussed checking B/P daily and to call provider if elevated]  Nutrition Interventions   Nutrition Discussed/Reviewed Nutrition Discussed, Decreasing salt  [Discussed to avoid higher sodium foods like [place] Interventions   Pharmacy Dicussed/Reviewed Pharmacy Topics Discussed, Medications and their functions  Safety Interventions   Safety Discussed/Reviewed Safety Discussed               SDOH assessments and interventions completed:  Yes  SDOH Interventions Today    Flowsheet Row Most Recent Value  SDOH Interventions   Food Insecurity Interventions Intervention Not Indicated  Housing Interventions Intervention Not Indicated  Transportation Interventions Intervention Not Indicated  Utilities Interventions Intervention Not Indicated        Care Coordination Interventions:  Yes, provided   Follow up plan: Follow up call scheduled for 04/22/22    Encounter Outcome:  Pt. Visit Completed  Peter Garter RN, [facility], CDE Care Management Coordinator [place] Management [phone]

## 2022-04-15 NOTE — Patient Instructions (Signed)
Visit Information  Thank you for taking time to visit with me today. Please don't hesitate to contact me if I can be of assistance to you.   Following are the goals we discussed today:   Goals Addressed             This Visit's Progress    Care Coordination Activities- self management of hypertension       Interventions Today    Flowsheet Row Most Recent Value  Chronic Disease   Chronic disease during today's visit Hypertension (HTN), Other  [CVA]  General Interventions   General Interventions Discussed/Reviewed General Interventions Discussed, Durable Medical Equipment (DME), Doctor Visits  Doctor Visits Discussed/Reviewed Doctor Visits Discussed, Annual Wellness Visits, PCP  [Discussed to call PCP to have cough and B/P checked]  Durable Medical Equipment (DME) BP Cuff  [Discussed to take her B/P cuff with her to her MD appt to check it for accuracy]  PCP/Specialist Visits Compliance with follow-up visit  Education Interventions   Education Provided Provided Education  Provided Verbal Education On When to see the doctor, Other, Nutrition  [Discussed checking B/P daily and to call provider if elevated]  Nutrition Interventions   Nutrition Discussed/Reviewed Nutrition Discussed, Decreasing salt  [Discussed to avoid higher sodium foods like [place] Interventions   Pharmacy Dicussed/Reviewed Pharmacy Topics Discussed, Medications and their functions  Safety Interventions   Safety Discussed/Reviewed Safety Discussed              Our next appointment is by telephone on 04/22/22 at 11:30 AM  Please call the care guide team at [phone] if you need to cancel or reschedule your appointment.   If you are experiencing a Mental Health or [place] or need someone to talk to, please call the Suicide and Crisis Lifeline: 988 call the [place] National Suicide Prevention Lifeline: 269-191-2050 or TTY: 7091856540 TTY 775-373-0614) to talk to a trained  counselor call 1-800-273-TALK (toll free, 24 hour hotline) go to [facility] [address], [place] [phone]) call 911   The patient verbalized understanding of instructions, educational materials, and care plan provided today and agreed to receive a mailed copy of patient instructions, educational materials, and care plan.   Telephone follow up appointment with care management team member scheduled for: 04/22/22  SIGNATURE Peter Garter RN, [facility] [place], [place] Management [phone]

## 2022-04-16 ENCOUNTER — Telehealth: Payer: Self-pay

## 2022-04-16 DIAGNOSIS — R059 Cough, unspecified: Secondary | ICD-10-CM | POA: Diagnosis not present

## 2022-04-16 NOTE — Patient Instructions (Signed)
Visit Information  Thank you for taking time to visit with me today. Please don't hesitate to contact me if I can be of assistance to you.   Following are the goals we discussed today:   Goals Addressed             This Visit's Progress    Care Coordination Activities- self management of hypertension       Interventions Today    Flowsheet Row Most Recent Value  Chronic Disease   Chronic disease during today's visit Hypertension (HTN)  General Interventions   General Interventions Discussed/Reviewed General Interventions Reviewed  Doctor Visits Discussed/Reviewed Doctor Visits Reviewed, PCP  PCP/Specialist Visits Compliance with follow-up visit  [Pt saw provider today and Mother called to update RNCM]  Pharmacy Interventions   Pharmacy Dicussed/Reviewed Pharmacy Topics Reviewed, Medications and their functions  [Discussed potential side effects of prednisone]              Our next appointment is by telephone on 04/22/22 at 11:30 AM  Please call the care guide team at [phone] if you need to cancel or reschedule your appointment.   If you are experiencing a Mental Health or [place] or need someone to talk to, please call the Suicide and Crisis Lifeline: 988 call the [place] National Suicide Prevention Lifeline: (814)666-8889 or TTY: (661) 819-4334 TTY 9524867418) to talk to a trained counselor call 1-800-273-TALK (toll free, 24 hour hotline) go to [facility] [address], [place] [phone]) call 911   The patient verbalized understanding of instructions, educational materials, and care plan provided today and DECLINED offer to receive copy of patient instructions, educational materials, and care plan.   Telephone follow up appointment with care management team member scheduled for:  SIGNATURE Peter Garter RN, [facility], [place] Management Coordinator [place] Management [phone]

## 2022-04-16 NOTE — Patient Outreach (Signed)
  Care Coordination   Follow Up Visit Note   04/16/2022 Name: Debra Klein MRN: [id number] DOB: 04-02-1975  Debra Klein is a 47 y.o. year old female who sees Gaynelle Arabian, MD for primary care. I  spoke with Debra Klein DPR  What matters to the patients health and wellness today?  Debra called to tell RNCM that pt saw MD today and he put her on prednisone for her cough.  States her B/P is better and was 144/90 at the doctors office    Goals Addressed             This Visit's Progress    Care Coordination Activities- self management of hypertension       Interventions Today    Flowsheet Row Most Recent Value  Chronic Disease   Chronic disease during today's visit Hypertension (HTN)  General Interventions   General Interventions Discussed/Reviewed General Interventions Reviewed  Doctor Visits Discussed/Reviewed Doctor Visits Reviewed, PCP  PCP/Specialist Visits Compliance with follow-up visit  [Pt saw provider today and Debra called to update RNCM]  Pharmacy Interventions   Pharmacy Dicussed/Reviewed Pharmacy Topics Reviewed, Medications and their functions  [Discussed potential side effects of prednisone]              SDOH assessments and interventions completed:  No     Care Coordination Interventions:  Yes, provided   Follow up plan: Follow up call scheduled for 04/22/22    Encounter Outcome:  Pt. Visit Completed  Peter Garter RN, [facility], CDE Care Management Coordinator [place] Management [phone]

## 2022-04-21 ENCOUNTER — Encounter: Payer: Self-pay | Admitting: Physical Therapy

## 2022-04-21 ENCOUNTER — Ambulatory Visit: Payer: 59 | Admitting: Physical Therapy

## 2022-04-21 DIAGNOSIS — R2689 Other abnormalities of gait and mobility: Secondary | ICD-10-CM

## 2022-04-21 DIAGNOSIS — R2681 Unsteadiness on feet: Secondary | ICD-10-CM | POA: Diagnosis not present

## 2022-04-21 DIAGNOSIS — M6281 Muscle weakness (generalized): Secondary | ICD-10-CM

## 2022-04-21 NOTE — Therapy (Signed)
OUTPATIENT PHYSICAL THERAPY NEURO TREATMENT   Patient Name: Debra Klein MRN: [id number] DOB:09-22-1975, 47 y.o., female Today's Date: 04/21/2022   PCP: Debra Heys, MD REFERRING PROVIDER: Irven Coe, MD  END OF SESSION:  PT End of Session - 04/21/22 [phone]     Visit Number 3    Number of Visits 5   4 + eval   Date for PT Re-Evaluation 05/08/22    Authorization Type [employer] MEDICARE    PT Start Time 0935   pt late   PT Stop Time 1015    PT Time Calculation (min) 40 min    Equipment Utilized During Treatment Gait belt    Activity Tolerance Patient tolerated treatment well   pt sleeps w/ head against wall as mother answers questions.   Behavior During Therapy Restless              Past Medical History:  Diagnosis Date   CVA (cerebral vascular accident)    Hyperlipemia    Hypertension    IDA (iron deficiency anemia)    Schizophrenia    Past Surgical History:  Procedure Laterality Date   KNEE ARTHROSCOPY Left 06/24/2021   Procedure: ARTHROSCOPY LEFT KNEE IRRIGRATION AND DEBRIEDMENT AND SYNOVECTOMY;  Surgeon: Debra Hitch, MD;  Location: [facility] OR;  Service: Orthopedics;  Laterality: Left;   MITRAL VALVE REPAIR N/A 06/27/2021   Procedure: MITRAL VALVE REPAIR USING SIMUFORM SEMI-RIGID ANNULOPLASTY RING;  Surgeon: Debra Slot, MD;  Location: [facility] OR;  Service: Open Heart Surgery;  Laterality: N/A;   No previous surgery     TEE WITHOUT CARDIOVERSION N/A 06/27/2021   Procedure: TRANSESOPHAGEAL ECHOCARDIOGRAM (TEE);  Surgeon: Debra Slot, MD;  Location: [facility] OR;  Service: Open Heart Surgery;  Laterality: N/A;   Patient Active Problem List   Diagnosis Date Noted   Acute encephalopathy 11/19/2021   Hypomagnesemia 11/19/2021   Acute cystitis 11/19/2021   QT prolongation 11/19/2021   Acute bacterial endocarditis    Pressure injury of skin 06/28/2021   S/P mitral valve repair 06/27/2021   Septic arthritis of knee, left 06/21/2021    Bacteremia due to methicillin susceptible Staphylococcus aureus (MSSA) 06/20/2021   Toxic encephalopathy 06/20/2021   Cerebrovascular accident (CVA) due to embolic occlusion of left middle cerebral artery 06/20/2021   Acute respiratory failure with hypoxia 06/20/2021   Essential hypertension 06/19/2021   HLD (hyperlipidemia) 06/19/2021   Chronic iron deficiency anemia 06/19/2021   Prediabetes 06/19/2021   Schizophrenia 06/19/2021   Sepsis 06/19/2021   Lactic acidosis 06/19/2021   Transaminitis 06/19/2021   AKI (acute kidney injury) 06/19/2021   Hypokalemia 06/19/2021    ONSET DATE: 04/02/2022  REFERRING DIAG: J19.14 (ICD-10-CM) - Personal history of transient ischemic attack (TIA), and cerebral infarction without residual deficits  THERAPY DIAG:  Muscle weakness (generalized)  Unsteadiness on feet  Other abnormalities of gait and mobility  Rationale for Evaluation and Treatment: Rehabilitation  SUBJECTIVE:  SUBJECTIVE STATEMENT: Patient states she has not been coughing as much following visit with Debra Klein regarding cough.  She was taking Prednisone for this.  She denies falls, but she almost fell backwards the other day when she went to stand up. Pt accompanied by: family member-Mother Debra Klein  PERTINENT HISTORY: HTN, HLD, Schizophrenia, embolic left MCA CVA 06/20/2021 w/o intervention, s/p mitral valve repair  PAIN:  Are you having pain? Yes: NPRS scale: 7/10 Pain location: bilateral knees-left worse than right Pain description: "it's arthritis" Aggravating factors: unsure Relieving factors: unsure  PRECAUTIONS: Fall  WEIGHT BEARING RESTRICTIONS: No  FALLS: Has patient fallen in last 6 months? No  LIVING ENVIRONMENT: Lives with: lives with their family Lives in:  House/apartment Stairs: Yes: Internal: split level steps; switches from left to right going up and External: 6 steps; on left going up Has following equipment at home: Single point cane, Walker - 2 wheeled, Wheelchair (manual), Shower bench, bed side commode, and Grab bars  PLOF: Independent with household mobility with device, Needs assistance with ADLs, Needs assistance with homemaking, and Needs assistance with transfers  PATIENT GOALS: pt unsure  OBJECTIVE:   DIAGNOSTIC FINDINGS:  Brain MRI w/o contrast   COGNITION: Overall cognitive status: History of cognitive impairments - at baseline   SENSATION: WFL  COORDINATION: No issues noted during ambulatory tasks  EDEMA:  Baseline in BLE, wears compression stockings, BLE are firm, MD is aware and managing ongoing issue.  MUSCLE TONE: not formally assessed  POSTURE: No Significant postural limitations  LOWER EXTREMITY ROM:     Active  Right Eval Left Eval  Hip flexion Grossly WFL  Hip extension   Hip abduction   Hip adduction   Hip internal rotation   Hip external rotation   Knee flexion   Knee extension   Ankle dorsiflexion   Ankle plantarflexion Limited ~50% WFL  Ankle inversion    Ankle eversion     (Blank rows = not tested)  LOWER EXTREMITY MMT:    MMT Right Eval Left Eval  Hip flexion Able to functionally ambulate w/ AD.  Hip extension   Hip abduction   Hip adduction   Hip internal rotation   Hip external rotation   Knee flexion   Knee extension   Ankle dorsiflexion   Ankle plantarflexion   Ankle inversion    Ankle eversion    (Blank rows = not tested)  BED MOBILITY:  Sit to supine SBA Supine to sit SBA  TRANSFERS: Assistive device utilized: Environmental consultant - 2 wheeled  Sit to stand: SBA Stand to sit: SBA Chair to chair: SBA  STAIRS: Level of Assistance: CGA Stair Negotiation Technique: Step to Pattern Forwards with Bilateral Rails Number of Stairs: 4  Height of Stairs: 6"  Comments: Pt is  unwilling to initiate stairs w/ LLE leading stating she cannot despite being able to lift LE to first step w/o issue.  She is able to perform w/ RLE leading on ascent and LLE leading on descent.  GAIT: Gait pattern: decreased stride length, decreased hip/knee flexion- Right, decreased hip/knee flexion- Left, decreased ankle dorsiflexion- Right, decreased ankle dorsiflexion- Left, narrow BOS, poor foot clearance- Right, and poor foot clearance- Left Distance walked: various clinic distances including out into parking lot during fire drill. Assistive device utilized: Environmental consultant - 2 wheeled Level of assistance: SBA Comments: Pt ambulates w/ slight RLE in-toeing, no noted toe drag.  FUNCTIONAL TESTS:  5 times sit to stand: 21.78seconds w/ BUE support and cues for upright posture  PATIENT SURVEYS:  FOTO N/A due to chronicity of CVA  TODAY'S TREATMENT:                                                                                                                              DATE: 04/21/2022 -Standing march alternating LE x20 w/ countertop support -Standing hip abduction w/ return demo and max cuing > regressed to side step out with poor form and patient perseverating on counting aloud requiring redirection -Side-lying clamshells x12 each LE, PT facilitating form w/ good carryover w/ reps and encouragement -Pt requires increased time and directions for returning to sit EOM, but is able to complete without physical assistance  GAIT: Gait pattern: step through pattern, decreased stride length, decreased hip/knee flexion- Right, decreased hip/knee flexion- Left, decreased ankle dorsiflexion- Right, decreased ankle dorsiflexion- Left, Right foot flat, Left foot flat, trunk flexed, and wide BOS Distance walked: 600' Assistive device utilized: Environmental consultantWalker - 2 wheeled Level of assistance: SBA Comments: Pt demonstrates improved path finding and continuous gait today.  She continues to need cues to clear obstacles  primarily on her left side.  Greater difficulty maintaining adequate left knee bend despite cues creating waddling appearance of gait.  -STS w/ progression from pull-to-stand to single STS w/o UE support, max encouragement and redirection to task, practiced forward rocking to improve anterior weight-shifting -PT ambulates with patient to front lobby then restroom SBA  PATIENT EDUCATION: Education details: Additions to HEP.  Person educated: Patient and Parent Education method: Explanation Education comprehension: verbalized understanding and needs further education  HOME EXERCISE PROGRAM: Access Code: [email or web address] URL: [email or web address] Date: 04/21/2022 Prepared by: Camille BalMarissa Meilech Virts  Exercises - Sit to Stand with Hands on Knees  - 1 x daily - 4 x weekly - 3 sets - 10 reps - Supine Bridge  - 1 x daily - 4 x weekly - 3 sets - 10 reps - Seated Heel Raise  - 1 x daily - 4 x weekly - 3 sets - 10 reps - Clamshell  - 1 x daily - 4 x weekly - 2 sets - 12 reps - Standing March with Counter Support  - 1 x daily - 4 x weekly - 2 sets - 20 reps  GOALS: Goals reviewed with patient? Yes  SHORT TERM GOALS = LONG TERM GOALS: Target date: 05/08/2022  Pt will be compliant with finalized strength and balance HEP with supervision from family. Baseline: To be established. Goal status: INITIAL  2.  Pt will decrease 5xSTS to </=15 seconds in order to demonstrate decreased risk for falls and improved functional bilateral LE strength and power. Baseline: 21.78 seconds w/ BUE support Goal status: INITIAL  3.  Pt will manage 6 stairs reciprocally w/ left rail only at no more than SBA to promote improved function in home environment. Baseline: 4 stairs CGA bilateral rails step-to pattern Goal status: INITIAL  ASSESSMENT:  CLINICAL IMPRESSION: Made additions to HEP this session to progress functional LE  strength.  Patient requires increased cuing and time to promote independence with  rolling and sitting EOM as well as performing STS without UE support.  She continues to be limited by difficulty sustaining attention to task requiring frequent redirection.  PT to continue addressing safety with transfers and upright mobility using RW.  Will continue per POC.  OBJECTIVE IMPAIRMENTS: Abnormal gait, decreased activity tolerance, decreased balance, decreased knowledge of condition, decreased knowledge of use of DME, difficulty walking, increased edema, improper body mechanics, and pain.   ACTIVITY LIMITATIONS: carrying, lifting, bending, squatting, stairs, transfers, bed mobility, bathing, dressing, hygiene/grooming, locomotion level, and caring for others  PARTICIPATION LIMITATIONS: meal prep, cleaning, laundry, medication management, personal finances, interpersonal relationship, driving, shopping, community activity, and occupation  PERSONAL FACTORS: Age, Behavior pattern, Fitness, Past/current experiences, Sex, Time since onset of injury/illness/exacerbation, Transportation, and 3+ comorbidities: HTN, HLD, prior CVA, and Schizophrenia  are also affecting patient's functional outcome.   REHAB POTENTIAL: Good  CLINICAL DECISION MAKING: Evolving/moderate complexity  EVALUATION COMPLEXITY: Moderate  PLAN:  PT FREQUENCY: 1x/week  PT DURATION: 4 weeks  PLANNED INTERVENTIONS: Therapeutic exercises, Therapeutic activity, Neuromuscular re-education, Balance training, Gait training, Patient/Family education, Self Care, Stair training, Vestibular training, DME instructions, Manual therapy, and Re-evaluation  PLAN FOR NEXT SESSION: Modify HEP prn for functional strength of BLE-STS working on dec hand support as able-try squatting to chair, gait training w/ RW, and stair training due to pt apprehension.     Sadie Haber, PT, DPT 04/21/2022, 10:16 AM

## 2022-04-21 NOTE — Patient Instructions (Signed)
Access Code: [email or web address] URL: [email or web address] Date: 04/21/2022 Prepared by: Camille Bal  Exercises - Sit to Stand with Hands on Knees  - 1 x daily - 4 x weekly - 3 sets - 10 reps - Supine Bridge  - 1 x daily - 4 x weekly - 3 sets - 10 reps - Seated Heel Raise  - 1 x daily - 4 x weekly - 3 sets - 10 reps - Clamshell  - 1 x daily - 4 x weekly - 2 sets - 12 reps - Standing March with Counter Support  - 1 x daily - 4 x weekly - 2 sets - 20 reps

## 2022-04-22 ENCOUNTER — Ambulatory Visit: Payer: Self-pay

## 2022-04-22 NOTE — Patient Outreach (Signed)
  Care Coordination   Follow Up Visit Note   04/22/2022 Name: Debra Klein MRN: [id number] DOB: Sep 21, 1975  Debra Klein is a 47 y.o. year old female who sees Blair Heys, MD for primary care. I spoke with  Erin Fulling by phone today and also spoke with [name]  What matters to the patients health and wellness today?  Mother states that pts cough is gone since she is taking the prednisone State she tries to give pt vegetables everyday.  States pt does like sweets but she is now not making cakes or buying those things for pt.  States she is to have an eye exam tomorrow    Goals Addressed             This Visit's Progress    Care Coordination Activities- self management of hypertension       Interventions Today    Flowsheet Row Most Recent Value  Chronic Disease   Chronic disease during today's visit Hypertension (HTN)  General Interventions   General Interventions Discussed/Reviewed General Interventions Reviewed, Annual Eye Exam, Doctor Visits  [Pt to get eye exam tomorrow]  Doctor Visits Discussed/Reviewed Doctor Visits Reviewed, PCP  PCP/Specialist Visits Compliance with follow-up visit  [Pt saw PCP 04/16/22]  Exercise Interventions   Exercise Discussed/Reviewed Exercise Discussed  [Reviewed to continue with PT]  Education Interventions   Education Provided Provided Education, Provided Printed Education  Provided Verbal Education On Nutrition, Exercise, Medication, When to see the doctor  Nutrition Interventions   Nutrition Discussed/Reviewed Nutrition Reviewed, Adding fruits and vegetables, Decreasing salt, Increaing proteins, Portion sizes  [Reviewed portion sizes and the plate method.  Reviewed with Mother to try to have pt avoid concentrated sweets]  Pharmacy Interventions   Pharmacy Dicussed/Reviewed Pharmacy Topics Reviewed, Medications and their functions              SDOH assessments and interventions completed:  No     Care Coordination  Interventions:  Yes, provided   Follow up plan: Follow up call scheduled for 05/04/22    Encounter Outcome:  Pt. Visit Completed  Dudley Major RN, [facility], CDE Care Management Coordinator [facility] Care Management [phone]

## 2022-04-22 NOTE — Patient Instructions (Signed)
Visit Information  Thank you for taking time to visit with me today. Please don't hesitate to contact me if I can be of assistance to you.   Following are the goals we discussed today:   Goals Addressed             This Visit's Progress    Care Coordination Activities- self management of hypertension       Interventions Today    Flowsheet Row Most Recent Value  Chronic Disease   Chronic disease during today's visit Hypertension (HTN)  General Interventions   General Interventions Discussed/Reviewed General Interventions Reviewed, Annual Eye Exam, Doctor Visits  [Pt to get eye exam tomorrow]  Doctor Visits Discussed/Reviewed Doctor Visits Reviewed, PCP  PCP/Specialist Visits Compliance with follow-up visit  [Pt saw PCP 04/16/22]  Exercise Interventions   Exercise Discussed/Reviewed Exercise Discussed  [Reviewed to continue with PT]  Education Interventions   Education Provided Provided Education, Provided Printed Education  Provided Verbal Education On Nutrition, Exercise, Medication, When to see the doctor  Nutrition Interventions   Nutrition Discussed/Reviewed Nutrition Reviewed, Adding fruits and vegetables, Decreasing salt, Increaing proteins, Portion sizes  [Reviewed portion sizes and the plate method.  Reviewed with Mother to try to have pt avoid concentrated sweets]  Pharmacy Interventions   Pharmacy Dicussed/Reviewed Pharmacy Topics Reviewed, Medications and their functions              Our next appointment is by telephone on 05/04/22 at 11:30 AM  Please call the care guide team at [phone] if you need to cancel or reschedule your appointment.   If you are experiencing a Mental Health or Behavioral Health Crisis or need someone to talk to, please call the Suicide and Crisis Lifeline: 988 call the [place] National Suicide Prevention Lifeline: 226-861-9065 or TTY: 580-291-4610 TTY (856)239-0646) to talk to a trained counselor call 1-800-273-TALK (toll free, 24 hour  hotline) go to [facility] [address] [place] [phone]) call 911   The patient verbalized understanding of instructions, educational materials, and care plan provided today and agreed to receive a mailed copy of patient instructions, educational materials, and care plan.   Telephone follow up appointment with care management team member scheduled for: 05/04/22  SIGNATURE Dudley Major RN, Maximiano Coss, CDE Care Management Coordinator [facility] Management [phone]

## 2022-04-28 ENCOUNTER — Encounter: Payer: Self-pay | Admitting: Physical Therapy

## 2022-04-28 ENCOUNTER — Ambulatory Visit: Payer: 59 | Admitting: Physical Therapy

## 2022-04-28 DIAGNOSIS — R2689 Other abnormalities of gait and mobility: Secondary | ICD-10-CM

## 2022-04-28 DIAGNOSIS — R2681 Unsteadiness on feet: Secondary | ICD-10-CM | POA: Diagnosis not present

## 2022-04-28 DIAGNOSIS — M6281 Muscle weakness (generalized): Secondary | ICD-10-CM

## 2022-04-28 NOTE — Therapy (Signed)
OUTPATIENT PHYSICAL THERAPY NEURO TREATMENT   Patient Name: Debra Klein MRN: [id number] DOB:07/09/1975, 47 y.o., female Today's Date: 04/28/2022   PCP: Blair Heys, MD REFERRING PROVIDER: Irven Coe, MD  END OF SESSION:  PT End of Session - 04/28/22 [phone]     Visit Number 4    Number of Visits 5   4 + eval   Date for PT Re-Evaluation 05/08/22    Authorization Type [employer] MEDICARE    PT Start Time [phone]   pt in restroom at onset of session   PT Stop Time 1016    PT Time Calculation (min) 40 min    Equipment Utilized During Treatment Gait belt    Activity Tolerance Patient tolerated treatment well   pt sleeps w/ head against wall as mother answers questions.   Behavior During Therapy Restless              Past Medical History:  Diagnosis Date   CVA (cerebral vascular accident)    Hyperlipemia    Hypertension    IDA (iron deficiency anemia)    Schizophrenia    Past Surgical History:  Procedure Laterality Date   KNEE ARTHROSCOPY Left 06/24/2021   Procedure: ARTHROSCOPY LEFT KNEE IRRIGRATION AND DEBRIEDMENT AND SYNOVECTOMY;  Surgeon: Kathryne Hitch, MD;  Location: [facility] OR;  Service: Orthopedics;  Laterality: Left;   MITRAL VALVE REPAIR N/A 06/27/2021   Procedure: MITRAL VALVE REPAIR USING SIMUFORM SEMI-RIGID ANNULOPLASTY RING;  Surgeon: Loreli Slot, MD;  Location: [facility] OR;  Service: Open Heart Surgery;  Laterality: N/A;   No previous surgery     TEE WITHOUT CARDIOVERSION N/A 06/27/2021   Procedure: TRANSESOPHAGEAL ECHOCARDIOGRAM (TEE);  Surgeon: Loreli Slot, MD;  Location: [facility] OR;  Service: Open Heart Surgery;  Laterality: N/A;   Patient Active Problem List   Diagnosis Date Noted   Acute encephalopathy 11/19/2021   Hypomagnesemia 11/19/2021   Acute cystitis 11/19/2021   QT prolongation 11/19/2021   Acute bacterial endocarditis    Pressure injury of skin 06/28/2021   S/P mitral valve repair 06/27/2021   Septic arthritis  of knee, left 06/21/2021   Bacteremia due to methicillin susceptible Staphylococcus aureus (MSSA) 06/20/2021   Toxic encephalopathy 06/20/2021   Cerebrovascular accident (CVA) due to embolic occlusion of left middle cerebral artery 06/20/2021   Acute respiratory failure with hypoxia 06/20/2021   Essential hypertension 06/19/2021   HLD (hyperlipidemia) 06/19/2021   Chronic iron deficiency anemia 06/19/2021   Prediabetes 06/19/2021   Schizophrenia 06/19/2021   Sepsis 06/19/2021   Lactic acidosis 06/19/2021   Transaminitis 06/19/2021   AKI (acute kidney injury) 06/19/2021   Hypokalemia 06/19/2021    ONSET DATE: 04/02/2022  REFERRING DIAG: Y78.29 (ICD-10-CM) - Personal history of transient ischemic attack (TIA), and cerebral infarction without residual deficits  THERAPY DIAG:  Muscle weakness (generalized)  Unsteadiness on feet  Other abnormalities of gait and mobility  Rationale for Evaluation and Treatment: Rehabilitation  SUBJECTIVE:  SUBJECTIVE STATEMENT: Patient states her knees bothered her this morning from her arthritis, but she feels okay now.  She denies falls. Pt accompanied by: family member-Mother [name]  PERTINENT HISTORY: HTN, HLD, Schizophrenia, embolic left MCA CVA 06/20/2021 w/o intervention, s/p mitral valve repair  PAIN:  Are you having pain? No  PRECAUTIONS: Fall  WEIGHT BEARING RESTRICTIONS: No  FALLS: Has patient fallen in last 6 months? No  LIVING ENVIRONMENT: Lives with: lives with their family Lives in: House/apartment Stairs: Yes: Internal: split level steps; switches from left to right going up and External: 6 steps; on left going up Has following equipment at home: Single point cane, Walker - 2 wheeled, Wheelchair (manual), Shower bench, bed side commode, and  Grab bars  PLOF: Independent with household mobility with device, Needs assistance with ADLs, Needs assistance with homemaking, and Needs assistance with transfers  PATIENT GOALS: pt unsure  OBJECTIVE:   DIAGNOSTIC FINDINGS:  Brain MRI w/o contrast   COGNITION: Overall cognitive status: History of cognitive impairments - at baseline   SENSATION: WFL  COORDINATION: No issues noted during ambulatory tasks  EDEMA:  Baseline in BLE, wears compression stockings, BLE are firm, MD is aware and managing ongoing issue.  MUSCLE TONE: not formally assessed  POSTURE: No Significant postural limitations  LOWER EXTREMITY ROM:     Active  Right Eval Left Eval  Hip flexion Grossly WFL  Hip extension   Hip abduction   Hip adduction   Hip internal rotation   Hip external rotation   Knee flexion   Knee extension   Ankle dorsiflexion   Ankle plantarflexion Limited ~50% WFL  Ankle inversion    Ankle eversion     (Blank rows = not tested)  LOWER EXTREMITY MMT:    MMT Right Eval Left Eval  Hip flexion Able to functionally ambulate w/ AD.  Hip extension   Hip abduction   Hip adduction   Hip internal rotation   Hip external rotation   Knee flexion   Knee extension   Ankle dorsiflexion   Ankle plantarflexion   Ankle inversion    Ankle eversion    (Blank rows = not tested)  BED MOBILITY:  Sit to supine SBA Supine to sit SBA  TRANSFERS: Assistive device utilized: Environmental consultant - 2 wheeled  Sit to stand: SBA Stand to sit: SBA Chair to chair: SBA  STAIRS: Level of Assistance: CGA Stair Negotiation Technique: Step to Pattern Forwards with Bilateral Rails Number of Stairs: 4  Height of Stairs: 6"  Comments: Pt is unwilling to initiate stairs w/ LLE leading stating she cannot despite being able to lift LE to first step w/o issue.  She is able to perform w/ RLE leading on ascent and LLE leading on descent.  GAIT: Gait pattern: decreased stride length, decreased hip/knee  flexion- Right, decreased hip/knee flexion- Left, decreased ankle dorsiflexion- Right, decreased ankle dorsiflexion- Left, narrow BOS, poor foot clearance- Right, and poor foot clearance- Left Distance walked: various clinic distances including out into parking lot during fire drill. Assistive device utilized: Environmental consultant - 2 wheeled Level of assistance: SBA Comments: Pt ambulates w/ slight RLE in-toeing, no noted toe drag.  FUNCTIONAL TESTS:  5 times sit to stand: 21.78seconds w/ BUE support and cues for upright posture  PATIENT SURVEYS:  FOTO N/A due to chronicity of CVA  TODAY'S TREATMENT:  DATE: 04/28/2022 -Squats at edge of mat:  pt has significant difficulty sequencing despite maximum multimodal cuing and demonstration due to difficulty sustaining attention to task, was able to perform ~15 mini squats with decent form over several attempted reps; pt has most difficulty engaging bilateral knee bend to task resulting in pure forward flexion in standing on task initiation -SciFit in multi-peaks mode x8 minutes using BUE/BLE support on level 4.0 for dynamic pre-gait workout for reciprocal mobility and LE knee flexion prior to gait training.  GAIT: Gait pattern: step through pattern, decreased stride length, decreased hip/knee flexion- Right, decreased hip/knee flexion- Left, decreased ankle dorsiflexion- Right, decreased ankle dorsiflexion- Left, Right foot flat, Left foot flat, trunk flexed, and wide BOS Distance walked: 100' + 230' Assistive device utilized: Environmental consultant - 2 wheeled Level of assistance: SBA Comments: No carryover noted during initial bout of gait following  pre-gait task.  Trialed bilateral 2 lb ankle weights w/  mild improvement noted w/ progressed distance on second bout.  Intermittent verbal cues to improve LE mechanics and knee flexion throughout all bouts  following initial assessments.  Pt tends to turn to sit without RW and removes alternating UE from RW intermittently when distracted.  Education provided throughout ambulation on safety w/ RW management.  CURB:  Level of Assistance: CGA Assistive device utilized: Walker - 2 wheeled Curb Comments: Pt requires significant encouragement to engage to task stating "I can't!"  She uses excessive UE use on RW despite education to push through LE.  Cues provided for sequencing.  STAIRS:  Level of Assistance: SBA and CGA  Stair Negotiation Technique: Step to Pattern Forwards with Bilateral Rails  Number of Stairs: 4   Height of Stairs: 6"  Comments: CGA on ascent progressed to SBA on descent, pt has RLE torsion at hip on ascent.  She requires cues for safe sequencing on initiation of task, maintains slight forward lean.  PATIENT EDUCATION: Education details: Additions to HEP.   SAFETY FOR VARIOUS GAIT TASKS (see above). Person educated: Patient and Parent Education method: Explanation Education comprehension: verbalized understanding and needs further education  HOME EXERCISE PROGRAM: Access Code: [email or web address] URL: [email or web address] Date: 04/21/2022 Prepared by: Camille Bal  Exercises - Sit to Stand with Hands on Knees  - 1 x daily - 4 x weekly - 3 sets - 10 reps - Supine Bridge  - 1 x daily - 4 x weekly - 3 sets - 10 reps - Seated Heel Raise  - 1 x daily - 4 x weekly - 3 sets - 10 reps - Clamshell  - 1 x daily - 4 x weekly - 2 sets - 12 reps - Standing March with Counter Support  - 1 x daily - 4 x weekly - 2 sets - 20 reps  GOALS: Goals reviewed with patient? Yes  SHORT TERM GOALS = LONG TERM GOALS: Target date: 05/08/2022  Pt will be compliant with finalized strength and balance HEP with supervision from family. Baseline: To be established. Goal status: INITIAL  2.  Pt will decrease 5xSTS to </=15 seconds in order to demonstrate decreased risk for falls and  improved functional bilateral LE strength and power. Baseline: 21.78 seconds w/ BUE support Goal status: INITIAL  3.  Pt will manage 6 stairs reciprocally w/ left rail only at no more than SBA to promote improved function in home environment. Baseline: 4 stairs CGA bilateral rails step-to pattern Goal status: INITIAL  ASSESSMENT:  CLINICAL IMPRESSION: Emphasis of skilled session on engaging bilateral knee  flexion to functional strength and ambulatory training tasks today.  Patient has no carryover from task to task likely due to difficulty sustaining attention despite cuing and encouragement throughout session.  Pt is less hesitant with stairs and curb than on initial evaluation and education provided to mother in lobby on most successful strategies used and remaining need for guarding.  Discussed discharging next session with patient and mother in agreement.  Will assess goals at next visit.  OBJECTIVE IMPAIRMENTS: Abnormal gait, decreased activity tolerance, decreased balance, decreased knowledge of condition, decreased knowledge of use of DME, difficulty walking, increased edema, improper body mechanics, and pain.   ACTIVITY LIMITATIONS: carrying, lifting, bending, squatting, stairs, transfers, bed mobility, bathing, dressing, hygiene/grooming, locomotion level, and caring for others  PARTICIPATION LIMITATIONS: meal prep, cleaning, laundry, medication management, personal finances, interpersonal relationship, driving, shopping, community activity, and occupation  PERSONAL FACTORS: Age, Behavior pattern, Fitness, Past/current experiences, Sex, Time since onset of injury/illness/exacerbation, Transportation, and 3+ comorbidities: HTN, HLD, prior CVA, and Schizophrenia  are also affecting patient's functional outcome.   REHAB POTENTIAL: Good  CLINICAL DECISION MAKING: Evolving/moderate complexity  EVALUATION COMPLEXITY: Moderate  PLAN:  PT FREQUENCY: 1x/week  PT DURATION: 4  weeks  PLANNED INTERVENTIONS: Therapeutic exercises, Therapeutic activity, Neuromuscular re-education, Balance training, Gait training, Patient/Family education, Self Care, Stair training, Vestibular training, DME instructions, Manual therapy, and Re-evaluation  PLAN FOR NEXT SESSION: ASSESS LTGS-D/C!     Sadie Haber, PT, DPT 04/28/2022, 10:17 AM

## 2022-05-04 ENCOUNTER — Ambulatory Visit: Payer: Self-pay

## 2022-05-04 NOTE — Patient Instructions (Signed)
Visit Information  Thank you for taking time to visit with me today. Please don't hesitate to contact me if I can be of assistance to you.   Following are the goals we discussed today:   Goals Addressed             This Visit's Progress    Care Coordination Activities- self management of hypertension       Interventions Today    Flowsheet Row Most Recent Value  Chronic Disease   Chronic disease during today's visit Hypertension (HTN)  General Interventions   General Interventions Discussed/Reviewed General Interventions Reviewed, Doctor Visits, Durable Medical Equipment (DME)  Doctor Visits Discussed/Reviewed Doctor Visits Reviewed  Durable Medical Equipment (DME) BP Cuff  Exercise Interventions   Exercise Discussed/Reviewed Exercise Reviewed  [Reinforced to walk with cane or walker and to continue PT exercises]  Education Interventions   Education Provided Provided Education  Provided Verbal Education On Nutrition, Exercise, Other  [Reviewed to start checking B/P daily and to call doctor for abnormal readings]  Nutrition Interventions   Nutrition Discussed/Reviewed Nutrition Reviewed, Adding fruits and vegetables, Decreasing salt  Pharmacy Interventions   Pharmacy Dicussed/Reviewed Pharmacy Topics Reviewed  Safety Interventions   Safety Discussed/Reviewed Home Safety              Our next appointment is by telephone on 05/20/22 at 10:30 AM  Please call the care guide team at [phone] if you need to cancel or reschedule your appointment.   If you are experiencing a Mental Health or Behavioral Health Crisis or need someone to talk to, please call the Suicide and Crisis Lifeline: 988 call the [place] National Suicide Prevention Lifeline: 251-321-4545 or TTY: 418-291-0173 TTY (571)573-6711) to talk to a trained counselor call 1-800-273-TALK (toll free, 24 hour hotline) go to [facility] [address] [place] [phone]) call  911   The patient verbalized understanding of instructions, educational materials, and care plan provided today and DECLINED offer to receive copy of patient instructions, educational materials, and care plan.   Telephone follow up appointment with care management team member scheduled for: 05/20/22  SIGNATURE Dudley Major RN, Maximiano Coss, CDE Care Management Coordinator [facility] Care Management [phone]

## 2022-05-04 NOTE — Patient Outreach (Signed)
  Care Coordination   Follow Up Visit Note   05/04/2022 Name: Debra Klein MRN: [id number] DOB: [birth year]  Debra Klein is a 47 y.o. year old female who sees Debra Heys, MD for primary care. I spoke with  Debra Klein by phone today. Spoke with Debra Klein  What matters to the patients health and wellness today?  Debra states pt is doing good and getting more steady with her walking. States she has one more PT visit.  States her cough is much better.  States she is eating good and she tries to make sure she gets vegetables.  States she has not checked her B/P recently    Goals Addressed             This Visit's Progress    Care Coordination Activities- self management of hypertension       Interventions Today    Flowsheet Row Most Recent Value  Chronic Disease   Chronic disease during today's visit Hypertension (HTN)  General Interventions   General Interventions Discussed/Reviewed General Interventions Reviewed, Doctor Visits, Durable Medical Equipment (DME)  Doctor Visits Discussed/Reviewed Doctor Visits Reviewed  Durable Medical Equipment (DME) BP Cuff  Exercise Interventions   Exercise Discussed/Reviewed Exercise Reviewed  [Reinforced to walk with cane or walker and to continue PT exercises]  Education Interventions   Education Provided Provided Education  Provided Verbal Education On Nutrition, Exercise, Other  [Reviewed to start checking B/P daily and to call doctor for abnormal readings]  Nutrition Interventions   Nutrition Discussed/Reviewed Nutrition Reviewed, Adding fruits and vegetables, Decreasing salt  Pharmacy Interventions   Pharmacy Dicussed/Reviewed Pharmacy Topics Reviewed  Safety Interventions   Safety Discussed/Reviewed Home Safety              SDOH assessments and interventions completed:  No     Care Coordination Interventions:  Yes, provided   Follow up plan: Follow up call scheduled for 05/20/22    Encounter Outcome:   Pt. Visit Completed  Dudley Major RN, [facility] [place], CDE Care Management Coordinator [facility] Care Management [phone]

## 2022-05-05 ENCOUNTER — Ambulatory Visit: Payer: 59 | Admitting: Physical Therapy

## 2022-05-05 ENCOUNTER — Encounter: Payer: Self-pay | Admitting: Physical Therapy

## 2022-05-05 DIAGNOSIS — R2689 Other abnormalities of gait and mobility: Secondary | ICD-10-CM

## 2022-05-05 DIAGNOSIS — M6281 Muscle weakness (generalized): Secondary | ICD-10-CM | POA: Diagnosis not present

## 2022-05-05 DIAGNOSIS — R2681 Unsteadiness on feet: Secondary | ICD-10-CM | POA: Diagnosis not present

## 2022-05-05 NOTE — Therapy (Signed)
OUTPATIENT PHYSICAL THERAPY NEURO TREATMENT/DISCHARGE SUMMARY   Patient Name: Debra Klein MRN: [id number] DOB:[birth year], 47 y.o., female Today's Date: 05/05/2022   PCP: Blair Heys, MD REFERRING PROVIDER: Irven Coe, MD  PHYSICAL THERAPY DISCHARGE SUMMARY  Visits from Start of Care: 5  Current functional level related to goals / functional outcomes: See clinical impression statement.   Remaining deficits: Pt remains self-limiting with difficulty sustaining attention to task and carryover of cues.  She has difficulty performing stairs and STS due to reluctance and refusal of attempts without UE support or decreased UE support.  She remains safest w/ CGA when using single rail on stairs (SBA w/ bilateral rails) and use of RW for safest gait.   Education / Equipment: Discussed behavioral components impacting care with mom and patient.  Remaining limitations and progress towards goals.  Improved HEP form and engagement.   Ongoing safety w/ use of RW.  Discharge plan.  Need for maintained walking and activity w/ HEP to supplement.  Patient agrees to discharge. Patient goals were partially met. Patient is being discharged due to maximized rehab potential.    END OF SESSION:  PT End of Session - 05/05/22 0929     Visit Number 5    Number of Visits 5   4 + eval   Date for PT Re-Evaluation 05/08/22    Authorization Type [employer]IC[employer]    PT Start Time [phone]    PT Stop Time [phone]    PT Time Calculation (min) 21 min    Equipment Utilized During Treatment Gait belt    Activity Tolerance Patient tolerated treatment well   pt sleeps w/ head against wall as mother answers questions.   Behavior During Therapy WFL for tasks assessed/performed              Past Medical History:  Diagnosis Date   CVA (cerebral vascular accident)    Hyperlipemia    Hypertension    IDA (iron deficiency anemia)    Schizophrenia    Past Surgical History:  Procedure Laterality Date    KNEE ARTHROSCOPY Left 06/24/2021   Procedure: ARTHROSCOPY LEFT KNEE IRRIGRATION AND DEBRIEDMENT AND SYNOVECTOMY;  Surgeon: Kathryne Hitch, MD;  Location: [facility] OR;  Service: Orthopedics;  Laterality: Left;   MITRAL VALVE REPAIR N/A 06/27/2021   Procedure: MITRAL VALVE REPAIR USING SIMUFORM SEMI-RIGID ANNULOPLASTY RING;  Surgeon: Loreli Slot, MD;  Location: [facility] OR;  Service: Open Heart Surgery;  Laterality: N/A;   No previous surgery     TEE WITHOUT CARDIOVERSION N/A 06/27/2021   Procedure: TRANSESOPHAGEAL ECHOCARDIOGRAM (TEE);  Surgeon: Loreli Slot, MD;  Location: [facility] OR;  Service: Open Heart Surgery;  Laterality: N/A;   Patient Active Problem List   Diagnosis Date Noted   Acute encephalopathy 11/19/2021   Hypomagnesemia 11/19/2021   Acute cystitis 11/19/2021   QT prolongation 11/19/2021   Acute bacterial endocarditis    Pressure injury of skin 06/28/2021   S/P mitral valve repair 06/27/2021   Septic arthritis of knee, left 06/21/2021   Bacteremia due to methicillin susceptible Staphylococcus aureus (MSSA) 06/20/2021   Toxic encephalopathy 06/20/2021   Cerebrovascular accident (CVA) due to embolic occlusion of left middle cerebral artery 06/20/2021   Acute respiratory failure with hypoxia 06/20/2021   Essential hypertension 06/19/2021   HLD (hyperlipidemia) 06/19/2021   Chronic iron deficiency anemia 06/19/2021   Prediabetes 06/19/2021   Schizophrenia 06/19/2021   Sepsis 06/19/2021   Lactic acidosis 06/19/2021   Transaminitis 06/19/2021   AKI (acute  kidney injury) 06/19/2021   Hypokalemia 06/19/2021    ONSET DATE: 04/02/2022  REFERRING DIAG: Z61.09 (ICD-10-CM) - Personal history of transient ischemic attack (TIA), and cerebral infarction without residual deficits  THERAPY DIAG:  Muscle weakness (generalized)  Unsteadiness on feet  Other abnormalities of gait and mobility  Rationale for Evaluation and Treatment: Rehabilitation  SUBJECTIVE:                                                                                                                                                                                              SUBJECTIVE STATEMENT: Her knees are bothering her today.  Mom voices concern about discharge plan.  She denies falls.  She has been walking a little bit at home. Pt accompanied by: family member-Mother [name]  PERTINENT HISTORY: HTN, HLD, Schizophrenia, embolic left MCA CVA 06/20/2021 w/o intervention, s/p mitral valve repair  PAIN:  Are you having pain? Yes: NPRS scale: 7-8/10 Pain location: bilateral knees Pain description: "arthritis" Aggravating factors: activity Relieving factors: Tylenol  PRECAUTIONS: Fall  WEIGHT BEARING RESTRICTIONS: No  FALLS: Has patient fallen in last 6 months? No  LIVING ENVIRONMENT: Lives with: lives with their family Lives in: House/apartment Stairs: Yes: Internal: split level steps; switches from left to right going up and External: 6 steps; on left going up Has following equipment at home: Single point cane, Walker - 2 wheeled, Wheelchair (manual), Shower bench, bed side commode, and Grab bars  PLOF: Independent with household mobility with device, Needs assistance with ADLs, Needs assistance with homemaking, and Needs assistance with transfers  PATIENT GOALS: pt unsure  OBJECTIVE:   DIAGNOSTIC FINDINGS:  Brain MRI w/o contrast   COGNITION: Overall cognitive status: History of cognitive impairments - at baseline   SENSATION: WFL  COORDINATION: No issues noted during ambulatory tasks  EDEMA:  Baseline in BLE, wears compression stockings, BLE are firm, MD is aware and managing ongoing issue.  MUSCLE TONE: not formally assessed  POSTURE: No Significant postural limitations  LOWER EXTREMITY ROM:     Active  Right Eval Left Eval  Hip flexion Grossly WFL  Hip extension   Hip abduction   Hip adduction   Hip internal rotation   Hip external rotation    Knee flexion   Knee extension   Ankle dorsiflexion   Ankle plantarflexion Limited ~50% WFL  Ankle inversion    Ankle eversion     (Blank rows = not tested)  LOWER EXTREMITY MMT:    MMT Right Eval Left Eval  Hip flexion Able to functionally ambulate w/ AD.  Hip extension   Hip abduction   Hip adduction  Hip internal rotation   Hip external rotation   Knee flexion   Knee extension   Ankle dorsiflexion   Ankle plantarflexion   Ankle inversion    Ankle eversion    (Blank rows = not tested)  BED MOBILITY:  Sit to supine SBA Supine to sit SBA  TRANSFERS: Assistive device utilized: Environmental consultant - 2 wheeled  Sit to stand: SBA Stand to sit: SBA Chair to chair: SBA  STAIRS: Level of Assistance: CGA Stair Negotiation Technique: Step to Pattern Forwards with Bilateral Rails Number of Stairs: 4  Height of Stairs: 6"  Comments: Pt is unwilling to initiate stairs w/ LLE leading stating she cannot despite being able to lift LE to first step w/o issue.  She is able to perform w/ RLE leading on ascent and LLE leading on descent.  GAIT: Gait pattern: decreased stride length, decreased hip/knee flexion- Right, decreased hip/knee flexion- Left, decreased ankle dorsiflexion- Right, decreased ankle dorsiflexion- Left, narrow BOS, poor foot clearance- Right, and poor foot clearance- Left Distance walked: various clinic distances including out into parking lot during fire drill. Assistive device utilized: Environmental consultant - 2 wheeled Level of assistance: SBA Comments: Pt ambulates w/ slight RLE in-toeing, no noted toe drag.  FUNCTIONAL TESTS:  5 times sit to stand: 21.78seconds w/ BUE support and cues for upright posture  PATIENT SURVEYS:  FOTO N/A due to chronicity of CVA  TODAY'S TREATMENT:                                                                                                                              DATE: 05/05/2022 -PT discusses plan for discharge with patient and mom  acknowledging ongoing lack of carryover from sessions likely due to difficulty sustaining attention to task, reiterates that patient is safest with RW and will require ongoing encouragement to participate in functional tasks at home and to maintain activity.  Mom voices understanding. -Reviewed STS with attempts to progress to no UE support, pt verbalizing "I can't do that!"  PT attempts max encouragement with pt disengaging from task. -Verbally reviewed remainder of HEP, physically reviewed seated heel raise and standing march x20 each w/ cues for improved form (especially left hip flexor engagement during marches) as pt performs in decreased ROM w/ bilateral knee extension for heel raises -5xSTS (trial 1):  10.73 seconds w/ BUE support w/ pt remaining in 90-90 forward leaned position despite cuing.  -5xSTS (trial 2):  23.75 seconds w/ BUE support, return demo and max multimodal cuing to obtain full upright in standing  STAIRS:  Level of Assistance: SBA and CGA  Stair Negotiation Technique: Step to Pattern Sideways with Single Rail on Left  Number of Stairs: 4   Height of Stairs: 6"  Comments: Pt uses BUE to pull on left rail w/ step-to gait (left leading on ascent and RLE leading on descent).  Pt refuses further attempts of stairs despite therapeutic redirection to task.  PATIENT EDUCATION: Education details:  Discussed behavioral components impacting care with mom and patient.  Remaining limitations and progress towards goals.  Improved HEP form and engagement.   Ongoing safety w/ use of RW.  Discharge plan.  Need for maintained walking and activity w/ HEP to supplement.  Person educated: Patient and Parent Education method: Explanation Education comprehension: verbalized understanding and needs further education  HOME EXERCISE PROGRAM: Access Code: [email or web address] URL: [email or web address] Date: 04/21/2022 Prepared by: Camille Bal  Exercises - Sit to Stand with Hands on  Knees  - 1 x daily - 4 x weekly - 3 sets - 10 reps - Supine Bridge  - 1 x daily - 4 x weekly - 3 sets - 10 reps - Seated Heel Raise  - 1 x daily - 4 x weekly - 3 sets - 10 reps - Clamshell  - 1 x daily - 4 x weekly - 2 sets - 12 reps - Standing March with Counter Support  - 1 x daily - 4 x weekly - 2 sets - 20 reps  GOALS: Goals reviewed with patient? Yes  SHORT TERM GOALS = LONG TERM GOALS: Target date: 05/08/2022  Pt will be compliant with finalized strength and balance HEP with supervision from family. Baseline: Reviewed this visit, unsure of patient's performance at home (4/23) Goal status: PARTIALLY MET  2.  Pt will decrease 5xSTS to </=15 seconds in order to demonstrate decreased risk for falls and improved functional bilateral LE strength and power. Baseline: 21.78 seconds w/ BUE support; 23.75 seconds w/ BUE support (4/23) Goal status: NOT MET  3.  Pt will manage 6 stairs reciprocally w/ left rail only at no more than SBA to promote improved function in home environment. Baseline: 4 stairs CGA bilateral rails step-to pattern; 4 stairs using left rail only w/ step-to pattern sideways CGA (4/23) Goal status: NOT MET  ASSESSMENT:  CLINICAL IMPRESSION: Session concluded early as pt refused to continue working with PT today.  Goal assessment limited by patient refusal to continue stairs for assessment.  She continues to be self-limiting with activity despite cuing, attempts at behavior management, and encouragement during session.  She has ongoing difficulty sustaining attention to task and demonstrating carryover of cues.  She has difficulty performing stairs and STS due to reluctance and refusal of attempts without UE support or decreased UE support.  She remains safest w/ CGA when using single rail on stairs (SBA w/ bilateral rails) and use of RW for safest gait.  Overall, she has maximized her rehab potential and would benefit most from attempts to practice functional tasks in home as  practiced in clinic.  Will discharge at this time.  OBJECTIVE IMPAIRMENTS: Abnormal gait, decreased activity tolerance, decreased balance, decreased knowledge of condition, decreased knowledge of use of DME, difficulty walking, increased edema, improper body mechanics, and pain.   ACTIVITY LIMITATIONS: carrying, lifting, bending, squatting, stairs, transfers, bed mobility, bathing, dressing, hygiene/grooming, locomotion level, and caring for others  PARTICIPATION LIMITATIONS: meal prep, cleaning, laundry, medication management, personal finances, interpersonal relationship, driving, shopping, community activity, and occupation  PERSONAL FACTORS: Age, Behavior pattern, Fitness, Past/current experiences, Sex, Time since onset of injury/illness/exacerbation, Transportation, and 3+ comorbidities: HTN, HLD, prior CVA, and Schizophrenia  are also affecting patient's functional outcome.   REHAB POTENTIAL: Good  CLINICAL DECISION MAKING: Evolving/moderate complexity  EVALUATION COMPLEXITY: Moderate  PLAN:  PT FREQUENCY: 1x/week  PT DURATION: 4 weeks  PLANNED INTERVENTIONS: Therapeutic exercises, Therapeutic activity, Neuromuscular re-education, Balance training, Gait training, Patient/Family education, Self Care, Stair training, Vestibular  training, DME instructions, Manual therapy, and Re-evaluation  PLAN FOR NEXT SESSION:  N/A   Sadie Haber, PT, DPT 05/05/2022, 10:08 AM

## 2022-05-06 DIAGNOSIS — R600 Localized edema: Secondary | ICD-10-CM | POA: Diagnosis not present

## 2022-05-10 DIAGNOSIS — M00062 Staphylococcal arthritis, left knee: Secondary | ICD-10-CM | POA: Diagnosis not present

## 2022-05-10 DIAGNOSIS — R269 Unspecified abnormalities of gait and mobility: Secondary | ICD-10-CM | POA: Diagnosis not present

## 2022-05-10 DIAGNOSIS — I63412 Cerebral infarction due to embolism of left middle cerebral artery: Secondary | ICD-10-CM | POA: Diagnosis not present

## 2022-05-15 DIAGNOSIS — R609 Edema, unspecified: Secondary | ICD-10-CM | POA: Diagnosis not present

## 2022-05-19 ENCOUNTER — Other Ambulatory Visit: Payer: Self-pay | Admitting: *Deleted

## 2022-05-19 DIAGNOSIS — M7989 Other specified soft tissue disorders: Secondary | ICD-10-CM

## 2022-05-20 ENCOUNTER — Ambulatory Visit: Payer: Self-pay

## 2022-05-20 NOTE — Patient Outreach (Signed)
  Care Coordination   Follow Up Visit Note   05/20/2022 Name: Debra Klein MRN: [id number] DOB: Jun 10, 1975  Debra Klein is a 47 y.o. year old female who sees Blair Heys, MD for primary care. I spoke with  Erin Fulling by phone today. And DPR Mother Taneisha Livelsberger  What matters to the patients health and wellness today?  Mother states that pt saw her doctor on 05/15/22 for swelling in her legs States she gave her a fluid pill and she is to see the vein doctor on 05/28/22.  States she is not keeping her legs elevated.  States her B/P was 110/67 today.  States she makes sure pt follows a low sodium healthy diet as much as possible.    Goals Addressed             This Visit's Progress    Care Coordination Activities- self management of hypertension       Interventions Today    Flowsheet Row Most Recent Value  Chronic Disease   Chronic disease during today's visit Hypertension (HTN)  General Interventions   General Interventions Discussed/Reviewed General Interventions Reviewed, Doctor Visits, Durable Medical Equipment (DME)  Doctor Visits Discussed/Reviewed Doctor Visits Reviewed, PCP, Specialist  Durable Medical Equipment (DME) BP Cuff, Walker  PCP/Specialist Visits Compliance with follow-up visit  [Reviewed to keep appt with PCP on 06/17/22 and vein specialist on 05/28/22]  Exercise Interventions   Exercise Discussed/Reviewed Exercise Reviewed, Physical Activity, Assistive device use and maintanence  Physical Activity Discussed/Reviewed Home Exercise Program (HEP)  [Reinforeced to try to do the home exercises that PT has taught her]  Education Interventions   Education Provided Provided Education  Provided Verbal Education On Nutrition, Exercise, Other, When to see the doctor  [Reviewed to keep legs elevated as much as possible to help with swelling]  Nutrition Interventions   Nutrition Discussed/Reviewed Nutrition Reviewed, Decreasing salt, Fluid intake, Adding fruits and  vegetables  [Reinforced to follow a low sodium diet]  Pharmacy Interventions   Pharmacy Dicussed/Reviewed Pharmacy Topics Reviewed, Medications and their functions  Safety Interventions   Safety Discussed/Reviewed Safety Reviewed, Home Safety  Home Safety Assistive Devices  [Reinforced to use cane or walker at all times]              SDOH assessments and interventions completed:  No     Care Coordination Interventions:  Yes, provided   Follow up plan: Follow up call scheduled for 06/24/22    Encounter Outcome:  Pt. Visit Completed  Dudley Major RN, [facility], CDE Care Management Coordinator [facility] Care Management [phone]

## 2022-05-20 NOTE — Patient Instructions (Signed)
Visit Information  Thank you for taking time to visit with me today. Please don't hesitate to contact me if I can be of assistance to you.   Following are the goals we discussed today:   Goals Addressed             This Visit's Progress    Care Coordination Activities- self management of hypertension       Interventions Today    Flowsheet Row Most Recent Value  Chronic Disease   Chronic disease during today's visit Hypertension (HTN)  General Interventions   General Interventions Discussed/Reviewed General Interventions Reviewed, Doctor Visits, Durable Medical Equipment (DME)  Doctor Visits Discussed/Reviewed Doctor Visits Reviewed, PCP, Specialist  Durable Medical Equipment (DME) BP Cuff, Walker  PCP/Specialist Visits Compliance with follow-up visit  [Reviewed to keep appt with PCP on 06/17/22 and vein specialist on 05/28/22]  Exercise Interventions   Exercise Discussed/Reviewed Exercise Reviewed, Physical Activity, Assistive device use and maintanence  Physical Activity Discussed/Reviewed Home Exercise Program (HEP)  [Reinforeced to try to do the home exercises that PT has taught her]  Education Interventions   Education Provided Provided Education  Provided Verbal Education On Nutrition, Exercise, Other, When to see the doctor  [Reviewed to keep legs elevated as much as possible to help with swelling]  Nutrition Interventions   Nutrition Discussed/Reviewed Nutrition Reviewed, Decreasing salt, Fluid intake, Adding fruits and vegetables  [Reinforced to follow a low sodium diet]  Pharmacy Interventions   Pharmacy Dicussed/Reviewed Pharmacy Topics Reviewed, Medications and their functions  Safety Interventions   Safety Discussed/Reviewed Safety Reviewed, Home Safety  Home Safety Assistive Devices  [Reinforced to use cane or walker at all times]              Our next appointment is by telephone on 06/24/22 at 10:30 AM  Please call the care guide team at [phone] if you  need to cancel or reschedule your appointment.   If you are experiencing a Mental Health or Behavioral Health Crisis or need someone to talk to, please call the Suicide and Crisis Lifeline: 988 call the [place] National Suicide Prevention Lifeline: 620-270-1421 or TTY: 727-657-1343 TTY (670)061-1430) to talk to a trained counselor call 1-800-273-TALK (toll free, 24 hour hotline) go to [facility] [address] [place] [phone]) call 911   The patient verbalized understanding of instructions, educational materials, and care plan provided today and DECLINED offer to receive copy of patient instructions, educational materials, and care plan.   Telephone follow up appointment with care management team member scheduled for: 06/24/22  SIGNATURE Dudley Major RN, [facility] Coss, CDE Care Management Coordinator [facility] Care Management [phone]

## 2022-05-25 ENCOUNTER — Ambulatory Visit (HOSPITAL_COMMUNITY)
Admission: RE | Admit: 2022-05-25 | Discharge: 2022-05-25 | Disposition: A | Discharge status: Home / Self Care | Payer: 59 | Source: Ambulatory Visit | Attending: Surgery | Admitting: Surgery

## 2022-05-25 DIAGNOSIS — M7989 Other specified soft tissue disorders: Secondary | ICD-10-CM | POA: Insufficient documentation

## 2022-05-26 NOTE — Progress Notes (Unsigned)
[facility]           [facility] [place]  History and Physical   Debra Klein is a 47 y.o. female who presents with BLE leg swelling.  She is here with her mother.  Her mother tells me she has had some leg swelling since late last year.  Pt states that her left leg is worse than her right.  She tells me she gets some cramping in her legs when she walks but not always.  She tells me her skin is hard on her legs.   She has  medical history significant for ischemic CVA, schizophrenia, hyperlipidemia, chronic iron deficiency anemia associated with baseline hemoglobin range 8.5-10, status post mitral valve repair in June 2023 after she had a left MCA infarct and septic left knee joint and found to have moderate to severe MR with small vegetation and moderate TR. Her blood cultures were + for MSSA.Marland Kitchen  She was most recently hospitalized in November with AMS and found to have + urine cx positive for pseudomonas.    The pt does not have hx of previous venous procedures. The patient has not history of DVT. Pt does not history of varicose vein.   Pt does have  skin changes in lower legs.   There is not family history of venous disorders.   The patient has  used compression stockings in the past.   She states they do not help or make her legs feel better.   The pt is on a statin for cholesterol management.  The pt is on a daily aspirin.   Other AC:  none The pt is on Diuretic for hypertension.   The pt is not on medication for diabetes.   Tobacco hx:  never  Pt does not have family hx of AAA.  Past Medical History:  Diagnosis Date   CVA (cerebral vascular accident) (HCC)    Hyperlipemia    Hypertension    IDA (iron deficiency anemia)    Schizophrenia (HCC)     Past Surgical History:  Procedure Laterality Date   KNEE ARTHROSCOPY Left 06/24/2021   Procedure: ARTHROSCOPY LEFT KNEE IRRIGRATION AND DEBRIEDMENT AND SYNOVECTOMY;  Surgeon: Kathryne Hitch, MD;   Location: [facility] OR;  Service: Orthopedics;  Laterality: Left;   MITRAL VALVE REPAIR N/A 06/27/2021   Procedure: MITRAL VALVE REPAIR USING SIMUFORM SEMI-RIGID ANNULOPLASTY RING;  Surgeon: Loreli Slot, MD;  Location: [facility] OR;  Service: Open Heart Surgery;  Laterality: N/A;   No previous surgery     TEE WITHOUT CARDIOVERSION N/A 06/27/2021   Procedure: TRANSESOPHAGEAL ECHOCARDIOGRAM (TEE);  Surgeon: Loreli Slot, MD;  Location: [facility];  Service: Open Heart Surgery;  Laterality: N/A;    Social History   Socioeconomic History   Marital status: Single    Spouse name: Not on file   Number of children: Not on file   Years of education: Not on file   Highest education level: Not on file  Occupational History   Not on file  Tobacco Use   Smoking status: Never   Smokeless tobacco: Never  Substance and Sexual Activity   Alcohol use: Not Currently   Drug use: Not Currently   Sexual activity: Not Currently  Other Topics Concern   Not on file  Social History Narrative   Not on file   Social Determinants of Health   Financial Resource Strain: Not on file  Food Insecurity: No Food  Insecurity (04/15/2022)   Hunger Vital Sign    Worried About Running Out of Food in the Last Year: Never true    Ran Out of Food in the Last Year: Never true  Transportation Needs: No Transportation Needs (04/15/2022)   PRAPARE - Administrator, Civil Service (Medical): No    Lack of Transportation (Non-Medical): No  Physical Activity: Not on file  Stress: Not on file  Social Connections: Not on file  Intimate Partner Violence: Not on file    Family Hx: negative for varicose veins or AAA  Current Outpatient Medications  Medication Sig Dispense Refill   acetaminophen (TYLENOL) 500 MG tablet Take 1,000 mg by mouth every 6 (six) hours as needed for mild pain.     aspirin 81 MG chewable tablet Chew 1 tablet (81 mg total) by mouth daily.     atorvastatin (LIPITOR) 20 MG tablet Take 20  mg by mouth daily.     benztropine (COGENTIN) 0.5 MG tablet Take 0.5 mg by mouth at bedtime.     cloZAPine (CLOZARIL) 100 MG tablet Take 300 mg by mouth at bedtime.     fluPHENAZine (PROLIXIN) 2.5 MG tablet Take 2.5 mg by mouth at bedtime.     furosemide (LASIX) 20 MG tablet Take 20 mg by mouth daily.     hydrochlorothiazide (HYDRODIURIL) 12.5 MG tablet Take 12.5 mg by mouth every morning.     iron polysaccharides (NIFEREX) 150 MG capsule Take 150 mg by mouth 2 (two) times daily.     Potassium Chloride ER 20 MEQ TBCR Take 1 tablet by mouth every other day.     vitamin B-12 1000 MCG tablet Take 1 tablet (1,000 mcg total) by mouth daily. (Patient not taking: Reported on 11/19/2021)     No current facility-administered medications for this visit.    No Known Allergies  REVIEW OF SYSTEMS:   [X]  denotes positive finding, [ ]  denotes negative finding Cardiac  Comments:  Chest pain or chest pressure:    Shortness of breath upon exertion:    Short of breath when lying flat:    Irregular heart rhythm:        Vascular    Pain in calf, thigh, or hip brought on by ambulation: x   Pain in feet at night that wakes you up from your sleep:     Blood clot in your veins:    Leg swelling:  x       Pulmonary    Oxygen at home:    Productive cough:     Wheezing:         Neurologic    Sudden weakness in arms or legs:     Sudden numbness in arms or legs:     Sudden onset of difficulty speaking or slurred speech:    Temporary loss of vision in one eye:     Problems with dizziness:         Gastrointestinal    Blood in stool:     Vomited blood:         Genitourinary    Burning when urinating:     Blood in urine:        Psychiatric    Major depression:         Hematologic    Bleeding problems:    Problems with blood clotting too easily:        Skin    Rashes or ulcers:        Constitutional  Fever or chills:      PHYSICAL EXAMINATION:  Today's Vitals   05/28/22 0819  BP: (!)  162/96  Pulse: 60  Resp: 18  Temp: 98.2 F (36.8 C)  TempSrc: Temporal  SpO2: 99%  Weight: 207 lb 3.2 oz (94 kg)  Height: 5' 5.5" (1.664 m)  PainSc: 0-No pain   Body mass index is 33.96 kg/m.   General:  WDWN in NAD; vital signs documented above Gait: Not observed HENT: WNL, normocephalic Pulmonary: normal non-labored breathing without wheezing Cardiac: regular HR; without carotid bruits Skin: without rashes Vascular Exam/Pulses:  Right Left  Radial 2+ (normal) 2+ (normal)  DP monophasic Brisk biphasic  PT Unable to doppler Unable to doppler  Peroneal monophasic Brisk monophasic   Extremities: BLE swelling with hyperkeratosis bilateral legs; +stemmer sign; no non healing wounds  Neurologic: A&O X 3;  moving all extremities equally Psychiatric:  The pt has  flat  affect (hx of stroke)   Non-Invasive Vascular Imaging:   Venous duplex on 05/25/2022: +--------------+---------+------+-----------+------------+--------+  LEFT         Reflux NoRefluxReflux TimeDiameter cmsComments                          Yes                                   +--------------+---------+------+-----------+------------+--------+  CFV          no                                              +--------------+---------+------+-----------+------------+--------+  FV mid        no                                              +--------------+---------+------+-----------+------------+--------+  Popliteal    no                                              +--------------+---------+------+-----------+------------+--------+  GSV at SFJ    no                            .455              +--------------+---------+------+-----------+------------+--------+  GSV prox thighno                            .542              +--------------+---------+------+-----------+------------+--------+  GSV mid thigh no                            .405               +--------------+---------+------+-----------+------------+--------+  GSV dist thighno                            .298              +--------------+---------+------+-----------+------------+--------+  GSV at knee   no                            .212              +--------------+---------+------+-----------+------------+--------+  GSV prox calf no                            .190              +--------------+---------+------+-----------+------------+--------+  GSV mid calf                                        NWV       +--------------+---------+------+-----------+------------+--------+  SSV Pop Fossa no                            .190              +--------------+---------+------+-----------+------------+--------+  SSV prox calf no                            .310              +--------------+---------+------+-----------+------------+--------+  SSV mid calf                                        NWV       +--------------+---------+------+-----------+------------+--------+        Summary:  Left:  - No evidence of deep vein thrombosis seen in the left lower extremity,  from the common femoral through the popliteal veins.  - No evidence of superficial venous thrombosis in the left lower extremity.  - There is no evidence of venous reflux seen in the left lower extremity     Debra Klein is a 47 y.o. female who presents with: BLE swelling    -pt has palpable left DP pedal pulse.  Her pedal pulses on the right are not palpable.  She has been in physical therapy and she has been wearing compression stockings.  -pt does not have evidence of DVT.  Pt does not have venous reflux in the deep or superficial venous systems. She has a + stemmer sign and hyperkeratosis of both legs  indicating lymphedema -discussed with pt about continuing to wear her knee high 15-20 mmHg compression stockings  -discussed the importance of leg elevation and how to  elevate properly - pt is advised to elevate their legs and a diagram is given to them to demonstrate for pt to lay flat on their back with knees elevated and slightly bent with their feet higher than their knees, which puts their feet higher than their heart for 15 minutes per day.  If pt cannot lay flat, advised to lay as flat as possible.  -pt is advised to continue as much walking as possible and avoid sitting or standing for long periods of time.  -discussed importance of weight loss  -handout with recommendations given -pt will f/u in 4 weeks with ABI to get a baseline given she does not have palpable pulses and monophasic doppler flow on the right and be evaluated for lymphedema pumps.     Doreatha Massed, PAC  [facility] [phone]  Clinic MD:  Edilia Bo

## 2022-05-28 ENCOUNTER — Ambulatory Visit (INDEPENDENT_AMBULATORY_CARE_PROVIDER_SITE_OTHER): Payer: 59 | Admitting: Physician Assistant

## 2022-05-28 ENCOUNTER — Encounter: Payer: Self-pay | Admitting: Physician Assistant

## 2022-05-28 VITALS — BP 162/96 | HR 60 | Temp 98.2°F | Resp 18 | Ht 65.5 in | Wt 207.2 lb

## 2022-05-28 DIAGNOSIS — I890 Lymphedema, not elsewhere classified: Secondary | ICD-10-CM

## 2022-05-28 DIAGNOSIS — M7989 Other specified soft tissue disorders: Secondary | ICD-10-CM

## 2022-06-04 ENCOUNTER — Other Ambulatory Visit: Payer: Self-pay

## 2022-06-04 DIAGNOSIS — Z789 Other specified health status: Secondary | ICD-10-CM

## 2022-06-05 DIAGNOSIS — M179 Osteoarthritis of knee, unspecified: Secondary | ICD-10-CM | POA: Diagnosis not present

## 2022-06-05 DIAGNOSIS — E7800 Pure hypercholesterolemia, unspecified: Secondary | ICD-10-CM | POA: Diagnosis not present

## 2022-06-05 DIAGNOSIS — I10 Essential (primary) hypertension: Secondary | ICD-10-CM | POA: Diagnosis not present

## 2022-06-09 DIAGNOSIS — I63412 Cerebral infarction due to embolism of left middle cerebral artery: Secondary | ICD-10-CM | POA: Diagnosis not present

## 2022-06-09 DIAGNOSIS — R269 Unspecified abnormalities of gait and mobility: Secondary | ICD-10-CM | POA: Diagnosis not present

## 2022-06-09 DIAGNOSIS — M00062 Staphylococcal arthritis, left knee: Secondary | ICD-10-CM | POA: Diagnosis not present

## 2022-06-17 DIAGNOSIS — R7303 Prediabetes: Secondary | ICD-10-CM | POA: Diagnosis not present

## 2022-06-17 DIAGNOSIS — R059 Cough, unspecified: Secondary | ICD-10-CM | POA: Diagnosis not present

## 2022-06-17 DIAGNOSIS — K219 Gastro-esophageal reflux disease without esophagitis: Secondary | ICD-10-CM | POA: Diagnosis not present

## 2022-06-17 DIAGNOSIS — Z9989 Dependence on other enabling machines and devices: Secondary | ICD-10-CM | POA: Diagnosis not present

## 2022-06-17 DIAGNOSIS — R609 Edema, unspecified: Secondary | ICD-10-CM | POA: Diagnosis not present

## 2022-06-17 DIAGNOSIS — I10 Essential (primary) hypertension: Secondary | ICD-10-CM | POA: Diagnosis not present

## 2022-06-17 DIAGNOSIS — E7800 Pure hypercholesterolemia, unspecified: Secondary | ICD-10-CM | POA: Diagnosis not present

## 2022-06-24 ENCOUNTER — Ambulatory Visit: Payer: Self-pay

## 2022-06-24 NOTE — Patient Instructions (Signed)
  Visit Information  Thank you for taking time to visit with me today. Please don't hesitate to contact me if I can be of assistance to you before our next scheduled telephone appointment.   If you are experiencing a Mental Health or Behavioral Health Crisis or need someone to talk to, please call the Suicide and Crisis Lifeline: 988 call the [place] National Suicide Prevention Lifeline: (450) 033-6158 or TTY: (619)190-7283 TTY (717)207-2231) to talk to a trained counselor call 1-800-273-TALK (toll free, 24 hour hotline) go to [facility] [address] [place] [phone]) call 911   The patient verbalized understanding of instructions, educational materials, and care plan provided today and agreed to receive a mailed copy of patient instructions, educational materials, and care plan.   Rowe Pavy, RN, BSN, CEN [facility] [phone]

## 2022-06-24 NOTE — Patient Outreach (Signed)
  Care Coordination   Follow Up Visit Note   06/24/2022 Name: Debra Klein MRN: [id number] DOB: 26-Sep-1975  Debra Klein is a 47 y.o. year old female who sees Blair Heys, MD for primary care. I spoke with  Erin Fulling by phone today.  What matters to the patients health and wellness today?  Placed call to patient and mother. Reviewed with mother that patient went to the vein [facility] vascular center.  Patient and mother report no changes in leg swelling and the left is worse than the right.  Mother reports that patient is wearing her compression stocking as directed.  Mother reports patient is taking her medications as prescribed.   Mother cooks with garlic salt.  Patient has continued to have elevated BP.  Last reading yesterday of 155/86.  Mother reports difficulty getting patient to sit still and not talk when monitoring BP. Mother states that she got a new BP cuff but is not sure it is accurate.    Goals Addressed             This Visit's Progress    Care Coordination Activities- self management of hypertension       Interventions Today    Flowsheet Row Most Recent Value  Chronic Disease   Chronic disease during today's visit Hypertension (HTN)  General Interventions   General Interventions Discussed/Reviewed General Interventions Discussed  Exercise Interventions   Exercise Discussed/Reviewed Physical Activity  Physical Activity Discussed/Reviewed Physical Activity Reviewed  Education Interventions   Education Provided Provided Education  [Encouraged low salt diet. Will mail information to mother.  Reviewed importance of exercise.  Reviewed suggestion to take new BP cuff to tomorrows office visit to get staff to check to see if BP cuff is accurate.]  Provided Verbal Education On Nutrition, Exercise, Medication  Mental Health Interventions   Mental Health Discussed/Reviewed Mental Health Reviewed  [mother reports mental health is about the same]  Nutrition Interventions    Nutrition Discussed/Reviewed Decreasing salt, Nutrition Discussed  Pharmacy Interventions   Pharmacy Dicussed/Reviewed Medications and their functions              SDOH assessments and interventions completed:  No     Care Coordination Interventions:  Yes, provided   Follow up plan: Follow up call scheduled for 07/27/2022    Encounter Outcome:  Pt. Visit Completed   Rowe Pavy, RN, BSN, CEN [facility] Coordinator [phone]

## 2022-06-25 ENCOUNTER — Ambulatory Visit (HOSPITAL_COMMUNITY)
Admission: RE | Admit: 2022-06-25 | Discharge: 2022-06-25 | Disposition: A | Discharge status: Home / Self Care | Payer: 59 | Source: Ambulatory Visit | Attending: Vascular Surgery | Admitting: Vascular Surgery

## 2022-06-25 ENCOUNTER — Ambulatory Visit (INDEPENDENT_AMBULATORY_CARE_PROVIDER_SITE_OTHER): Payer: 59 | Admitting: Physician Assistant

## 2022-06-25 VITALS — BP 171/82 | HR 67 | Temp 98.1°F | Resp 20 | Ht 65.5 in | Wt 196.0 lb

## 2022-06-25 DIAGNOSIS — Z79899 Other long term (current) drug therapy: Secondary | ICD-10-CM | POA: Diagnosis not present

## 2022-06-25 DIAGNOSIS — J45909 Unspecified asthma, uncomplicated: Secondary | ICD-10-CM | POA: Diagnosis not present

## 2022-06-25 DIAGNOSIS — M7989 Other specified soft tissue disorders: Secondary | ICD-10-CM

## 2022-06-25 DIAGNOSIS — I10 Essential (primary) hypertension: Secondary | ICD-10-CM | POA: Diagnosis not present

## 2022-06-25 DIAGNOSIS — M79621 Pain in right upper arm: Secondary | ICD-10-CM | POA: Diagnosis not present

## 2022-06-25 DIAGNOSIS — R748 Abnormal levels of other serum enzymes: Secondary | ICD-10-CM | POA: Diagnosis not present

## 2022-06-25 DIAGNOSIS — F17210 Nicotine dependence, cigarettes, uncomplicated: Secondary | ICD-10-CM | POA: Diagnosis not present

## 2022-06-25 DIAGNOSIS — Z7982 Long term (current) use of aspirin: Secondary | ICD-10-CM | POA: Diagnosis not present

## 2022-06-25 DIAGNOSIS — R0989 Other specified symptoms and signs involving the circulatory and respiratory systems: Secondary | ICD-10-CM | POA: Diagnosis not present

## 2022-06-25 DIAGNOSIS — J449 Chronic obstructive pulmonary disease, unspecified: Secondary | ICD-10-CM | POA: Diagnosis not present

## 2022-06-25 DIAGNOSIS — Z789 Other specified health status: Secondary | ICD-10-CM | POA: Diagnosis not present

## 2022-06-25 DIAGNOSIS — I890 Lymphedema, not elsewhere classified: Secondary | ICD-10-CM | POA: Diagnosis not present

## 2022-06-25 LAB — VAS US ABI WITH/WO TBI
Left ABI: 1.16
Right ABI: 1.16

## 2022-06-25 NOTE — Progress Notes (Signed)
Office Note     CC:  follow up Requesting Provider:  Blair Heys, MD  HPI: Debra Klein is a 47 y.o. (26-Mar-1975) female who presents for recheck of bilateral lower extremity swelling.  She was last seen in office on 05/28/2022.  There was concern that she did not have any palpable pulses in her right foot and an ABI was ordered.  She denies any claudication or rest pain in her right foot.  She also is without tissue loss.  The edema of bilateral lower extremities has worsened over the past year.  She has skin changes consistent with lymphedema.  She is wearing knee-high compression stockings on a daily basis.  She is also elevating her legs when possible during the day above the level of her heart.  She has also increased her mobility in the past month and believes she can wean herself from using the walker relatively soon.  Her mother who is present today is encouraged by her progress.  She denies tobacco use.   Past Medical History:  Diagnosis Date   CVA (cerebral vascular accident) (HCC)    Hyperlipemia    Hypertension    IDA (iron deficiency anemia)    Schizophrenia (HCC)     Past Surgical History:  Procedure Laterality Date   KNEE ARTHROSCOPY Left 06/24/2021   Procedure: ARTHROSCOPY LEFT KNEE IRRIGRATION AND DEBRIEDMENT AND SYNOVECTOMY;  Surgeon: Kathryne Hitch, MD;  Location: [facility] OR;  Service: Orthopedics;  Laterality: Left;   MITRAL VALVE REPAIR N/A 06/27/2021   Procedure: MITRAL VALVE REPAIR USING SIMUFORM SEMI-RIGID ANNULOPLASTY RING;  Surgeon: Loreli Slot, MD;  Location: [facility] OR;  Service: Open Heart Surgery;  Laterality: N/A;   No previous surgery     TEE WITHOUT CARDIOVERSION N/A 06/27/2021   Procedure: TRANSESOPHAGEAL ECHOCARDIOGRAM (TEE);  Surgeon: Loreli Slot, MD;  Location: [facility] OR;  Service: Open Heart Surgery;  Laterality: N/A;    Social History   Socioeconomic History   Marital status: Single    Spouse name: Not on file   Number  of children: Not on file   Years of education: Not on file   Highest education level: Not on file  Occupational History   Not on file  Tobacco Use   Smoking status: Never   Smokeless tobacco: Never  Substance and Sexual Activity   Alcohol use: Not Currently   Drug use: Not Currently   Sexual activity: Not Currently  Other Topics Concern   Not on file  Social History Narrative   Not on file   Social Determinants of Health   Financial Resource Strain: Not on file  Food Insecurity: No Food Insecurity (04/15/2022)   Hunger Vital Sign    Worried About Running Out of Food in the Last Year: Never true    Ran Out of Food in the Last Year: Never true  Transportation Needs: No Transportation Needs (04/15/2022)   PRAPARE - Administrator, Civil Service (Medical): No    Lack of Transportation (Non-Medical): No  Physical Activity: Not on file  Stress: Not on file  Social Connections: Not on file  Intimate Partner Violence: Not on file   History reviewed. No pertinent family history.  Current Outpatient Medications  Medication Sig Dispense Refill   acetaminophen (TYLENOL) 500 MG tablet Take 1,000 mg by mouth every 6 (six) hours as needed for mild pain.     aspirin 81 MG chewable tablet Chew 1 tablet (81 mg total) by mouth daily.  atorvastatin (LIPITOR) 20 MG tablet Take 20 mg by mouth daily.     benztropine (COGENTIN) 0.5 MG tablet Take 0.5 mg by mouth at bedtime.     cloZAPine (CLOZARIL) 100 MG tablet Take 300 mg by mouth at bedtime.     fluPHENAZine (PROLIXIN) 2.5 MG tablet Take 2.5 mg by mouth at bedtime.     furosemide (LASIX) 20 MG tablet Take 20 mg by mouth daily.     hydrochlorothiazide (HYDRODIURIL) 12.5 MG tablet Take 12.5 mg by mouth every morning.     iron polysaccharides (NIFEREX) 150 MG capsule Take 150 mg by mouth 2 (two) times daily.     Potassium Chloride ER 20 MEQ TBCR Take 1 tablet by mouth every other day.     vitamin B-12 1000 MCG tablet Take 1 tablet  (1,000 mcg total) by mouth daily.     No current facility-administered medications for this visit.    No Known Allergies   REVIEW OF SYSTEMS:   [X]  denotes positive finding, [ ]  denotes negative finding Cardiac  Comments:  Chest pain or chest pressure:    Shortness of breath upon exertion:    Short of breath when lying flat:    Irregular heart rhythm:        Vascular    Pain in calf, thigh, or hip brought on by ambulation:    Pain in feet at night that wakes you up from your sleep:     Blood clot in your veins:    Leg swelling:         Pulmonary    Oxygen at home:    Productive cough:     Wheezing:         Neurologic    Sudden weakness in arms or legs:     Sudden numbness in arms or legs:     Sudden onset of difficulty speaking or slurred speech:    Temporary loss of vision in one eye:     Problems with dizziness:         Gastrointestinal    Blood in stool:     Vomited blood:         Genitourinary    Burning when urinating:     Blood in urine:        Psychiatric    Major depression:         Hematologic    Bleeding problems:    Problems with blood clotting too easily:        Skin    Rashes or ulcers:        Constitutional    Fever or chills:      PHYSICAL EXAMINATION:  Vitals:   06/25/22 0917  BP: (!) 171/82  Pulse: 67  Resp: 20  Temp: 98.1 F (36.7 C)  SpO2: 99%  Weight: 196 lb (88.9 kg)  Height: 5' 5.5" (1.664 m)    General:  WDWN in NAD; vital signs documented above Gait: Not observed HENT: WNL, normocephalic Pulmonary: normal non-labored breathing , without Rales, rhonchi,  wheezing Cardiac: regular HR Abdomen: soft, NT, no masses Skin: without rashes Vascular Exam/Pulses: brisk DP and PT by doppler Extremities: without ischemic changes, without Gangrene , without cellulitis; without open wounds; hyperkeratotic ptosis of the skin of both shins as well as hyperpigmentation Musculoskeletal: no muscle wasting or atrophy  Neurologic: A&O  X 3 Psychiatric:  The pt has Normal affect.  ASSESSMENT/PLAN:: 47 y.o. female here for follow up for evaluation of arterial circulation as well  as for lymphedema  -Fortunately ABIs show triphasic waveforms at the ankle bilateral lower extremities thus indicating she has no evidence of arterial insufficiency.  Since last office visit 1 month ago she has been wearing her compression on a daily basis, elevating her legs during the day, and improving her mobility with exercise and walking.  She will be a good candidate for lymphedema pumps.  We will ask bio tab to evaluate for possible lymphedema pumps in the near future.  For now she will follow-up on an as-needed basis.   Emilie Rutter, PA-C [facility] [phone]  Clinic MD:   Randie Heinz

## 2022-07-10 DIAGNOSIS — I63412 Cerebral infarction due to embolism of left middle cerebral artery: Secondary | ICD-10-CM | POA: Diagnosis not present

## 2022-07-10 DIAGNOSIS — R269 Unspecified abnormalities of gait and mobility: Secondary | ICD-10-CM | POA: Diagnosis not present

## 2022-07-10 DIAGNOSIS — M00062 Staphylococcal arthritis, left knee: Secondary | ICD-10-CM | POA: Diagnosis not present

## 2022-07-11 NOTE — Progress Notes (Unsigned)
Cardiology Clinic Note   Patient Name: Debra Klein Date of Encounter: 07/13/2022  Primary Care Provider:  Blair Heys, MD Primary Cardiologist:  Debra Millers, MD  Patient Profile    47 year old female history of hyperlipidemia, hypertension, iron deficiency anemia schizophrenia, MSSA mitral echo done status post mitral valve repair, with postoperative PAF treated with amiodarone, and CVA left MCA stroke suspected due to septic emboli.   Past Medical History    Past Medical History:  Diagnosis Date   CVA (cerebral vascular accident) (HCC)    Hyperlipemia    Hypertension    IDA (iron deficiency anemia)    Schizophrenia (HCC)    Past Surgical History:  Procedure Laterality Date   KNEE ARTHROSCOPY Left 06/24/2021   Procedure: ARTHROSCOPY LEFT KNEE IRRIGRATION AND DEBRIEDMENT AND SYNOVECTOMY;  Surgeon: Debra Hitch, MD;  Location: [facility] OR;  Service: Orthopedics;  Laterality: Left;   MITRAL VALVE REPAIR N/A 06/27/2021   Procedure: MITRAL VALVE REPAIR USING SIMUFORM SEMI-RIGID ANNULOPLASTY RING;  Surgeon: Debra Slot, MD;  Location: [facility] OR;  Service: Open Heart Surgery;  Laterality: N/A;   No previous surgery     TEE WITHOUT CARDIOVERSION N/A 06/27/2021   Procedure: TRANSESOPHAGEAL ECHOCARDIOGRAM (TEE);  Surgeon: Debra Slot, MD;  Location: [facility] OR;  Service: Open Heart Surgery;  Laterality: N/A;    Allergies  No Known Allergies  History of Present Illness    [name] comes today for ongoing assessment and management of hypertension, hyperlipidemia, history of CVA with MCA stroke, mitral valve repair in the setting of MSSA mitral valve endocarditis.  She comes today with her mother and is without complaints.  She is being treated for a chronic cough and has been started on Robitussin for this.  That is improved her coughing a great deal.  She denies any shortness of breath exertion.  Due to CVA she is cognitively slower but her mother states  that she has been doing well.  Her blood pressures have been slightly elevated at home and her primary care provider has increased her HCTZ from 12.5 mg daily to 25 mg daily.  Home Medications    Current Outpatient Medications  Medication Sig Dispense Refill   acetaminophen (TYLENOL) 500 MG tablet Take 1,000 mg by mouth every 6 (six) hours as needed for mild pain.     aspirin 81 MG chewable tablet Chew 1 tablet (81 mg total) by mouth daily.     atorvastatin (LIPITOR) 20 MG tablet Take 20 mg by mouth daily.     benztropine (COGENTIN) 0.5 MG tablet Take 0.5 mg by mouth at bedtime.     fluPHENAZine (PROLIXIN) 2.5 MG tablet Take 2.5 mg by mouth at bedtime.     hydrochlorothiazide (HYDRODIURIL) 25 MG tablet Take 25 mg by mouth daily.     iron polysaccharides (NIFEREX) 150 MG capsule Take 150 mg by mouth 2 (two) times daily.     Potassium Chloride ER 20 MEQ TBCR Take 1 tablet by mouth every other day.     traZODone (DESYREL) 100 MG tablet Take 100 mg by mouth at bedtime.     vitamin B-12 1000 MCG tablet Take 1 tablet (1,000 mcg total) by mouth daily.     cloZAPine (CLOZARIL) 100 MG tablet Take 300 mg by mouth at bedtime. (Patient not taking: Reported on 07/13/2022)     No current facility-administered medications for this visit.     Family History    History reviewed. No pertinent family history. has no family  status information on file.   Social History    Social History   Socioeconomic History   Marital status: Single    Spouse name: Not on file   Number of children: Not on file   Years of education: Not on file   Highest education level: Not on file  Occupational History   Not on file  Tobacco Use   Smoking status: Never   Smokeless tobacco: Never  Substance and Sexual Activity   Alcohol use: Not Currently   Drug use: Not Currently   Sexual activity: Not Currently  Other Topics Concern   Not on file  Social History Narrative   Not on file   Social Determinants of Health    Financial Resource Strain: Not on file  Food Insecurity: No Food Insecurity (04/15/2022)   Hunger Vital Sign    Worried About Running Out of Food in the Last Year: Never true    Ran Out of Food in the Last Year: Never true  Transportation Needs: No Transportation Needs (04/15/2022)   PRAPARE - Administrator, Civil Service (Medical): No    Lack of Transportation (Non-Medical): No  Physical Activity: Not on file  Stress: Not on file  Social Connections: Not on file  Intimate Partner Violence: Not on file     Review of Systems    General:  No chills, fever, night sweats or weight changes.  Cardiovascular:  No chest pain, dyspnea on exertion, edema, orthopnea, palpitations, paroxysmal nocturnal dyspnea. Dermatological: No rash, lesions/masses Respiratory: No cough, dyspnea Urologic: No hematuria, dysuria Abdominal:   No nausea, vomiting, diarrhea, bright red blood per rectum, melena, or hematemesis Neurologic:  No visual changes, wkns, changes in mental status. All other systems reviewed and are otherwise negative except as noted above.     Physical Exam    VS:  BP (!) 149/87   Pulse (!) 52   Ht 5\' 5"  (1.651 m)   Wt 184 lb 12.8 oz (83.8 kg)   SpO2 100%   BMI 30.75 kg/m  , BMI Body mass index is 30.75 kg/m.     GEN: Well nourished, well developed, in no acute distress. HEENT: normal. Neck: Supple, no JVD, carotid bruits, or masses. Cardiac: RRR, no murmurs, rubs, or gallops. No clubbing, cyanosis, edema.  Radials/DP/PT 2+ and equal bilaterally.  Respiratory:  Respirations regular and unlabored, clear to auscultation bilaterally. GI: Soft, nontender, nondistended, BS + x 4. MS: no deformity or atrophy. Skin: warm and dry, no rash. Neuro:  Strength and sensation are intact. Psych: Normal affect.      Lab Results  Component Value Date   WBC 7.7 11/20/2021   HGB 9.0 (L) 11/20/2021   HCT 28.7 (L) 11/20/2021   MCV 93.8 11/20/2021   PLT 280 11/20/2021   Lab  Results  Component Value Date   CREATININE 0.81 11/21/2021   BUN 7 11/21/2021   NA 138 11/21/2021   K 4.4 11/21/2021   CL 110 11/21/2021   CO2 22 11/21/2021   Lab Results  Component Value Date   ALT 8 11/19/2021   AST 13 (L) 11/19/2021   ALKPHOS 39 11/19/2021   BILITOT 0.9 11/19/2021   Lab Results  Component Value Date   CHOL 161 06/20/2021   HDL <10 (L) 06/20/2021   LDLCALC UNABLE TO CALCULATE IF TRIGLYCERIDE OVER 400 mg/dL 60/45/4098   LDLDIRECT 27.7 06/21/2021   TRIG 157 (H) 06/23/2021   CHOLHDL  UNABLE TO CALCULATE 06/20/2021    Lab  Results  Component Value Date   HGBA1C 6.1 (H) 06/27/2021     Review of Prior Studies    Echocardiogram 11/25/2021 1. Left ventricular ejection fraction, by estimation, is 60 to 65%. The  left ventricle has normal function. The left ventricle has no regional  wall motion abnormalities. Left ventricular diastolic parameters are  indeterminate.   2. Right ventricular systolic function is normal. The right ventricular  size is normal. There is normal pulmonary artery systolic pressure.   3. S/p MVR 06/27/2021. MV mean gradient 6 mmhg, MVA >1.5 cm2 ; moderate  MS. Mild to moderate mitral valve regurgitation.   4. The aortic valve is tricuspid. Aortic valve regurgitation is not  visualized.   5. The inferior vena cava is normal in size with greater than 50%  respiratory variability, suggesting right atrial pressure of 3 mmHg.   Bilateral ABI 06/25/2022 Right: Resting right ankle-brachial index is within normal range. The  right toe-brachial index is normal.   Left: Resting left ankle-brachial index is within normal range. The left  toe-brachial index is normal.   Assessment & Plan   1.  Hypertension: Elevated today in the office but much better than prior recordings.  I did recheck it again into the 138/78.  Change her medications at this time.  They are to keep track of her blood pressures at home and if it remains persistently greater  than 140/80 may need to adjust medications.  Primary care is monitoring this as well and has increased her HCTZ from 12.5 mg daily to 25 mg daily for better blood pressure control.  She is to avoid salt.  2.  History of CVA due to embolic occlusion: Currently on aspirin 81 mg only.    3.  Hyperlipidemia: Remains on atorvastatin 20 mg daily.  Labs are followed by PCP.  Goal of LDL less than 100.  4.  History of bacterial endocarditis: Last echo revealed mitral valve regurgitation with moderate MS.  She is status post mitral valve repair June 2023.  Recommend repeating echo if she becomes symptomatic with shortness of breath, or palpitations.         Signed, Bettey Mare. Liborio Nixon, ANP, AACC   07/13/2022 11:20 AM      Office [phone] Fax [phone]  Notice: This dictation was prepared with Dragon dictation along with smaller phrase technology. Any transcriptional errors that result from this process are unintentional and may not be corrected upon review.

## 2022-07-13 ENCOUNTER — Ambulatory Visit: Payer: 59 | Attending: Adult Health | Admitting: Adult Health

## 2022-07-13 ENCOUNTER — Encounter: Payer: Self-pay | Admitting: Adult Health

## 2022-07-13 VITALS — BP 149/87 | HR 52 | Ht 65.0 in | Wt 184.8 lb

## 2022-07-13 DIAGNOSIS — E7800 Pure hypercholesterolemia, unspecified: Secondary | ICD-10-CM | POA: Diagnosis not present

## 2022-07-13 DIAGNOSIS — I10 Essential (primary) hypertension: Secondary | ICD-10-CM

## 2022-07-13 DIAGNOSIS — Z9889 Other specified postprocedural states: Secondary | ICD-10-CM | POA: Diagnosis not present

## 2022-07-13 DIAGNOSIS — I6340 Cerebral infarction due to embolism of unspecified cerebral artery: Secondary | ICD-10-CM

## 2022-07-13 NOTE — Patient Instructions (Signed)
Medication Instructions:  No Changes *If you need a refill on your cardiac medications before your next appointment, please call your pharmacy*   Lab Work: No labs If you have labs (blood work) drawn today and your tests are completely normal, you will receive your results only by: MyChart Message (if you have MyChart) OR A paper copy in the mail If you have any lab test that is abnormal or we need to change your treatment, we will call you to review the results.   Testing/Procedures: No Testing   Follow-Up: At [facility], you and your health needs are our priority.  As part of our continuing mission to provide you with exceptional heart care, we have created designated Provider Care Teams.  These Care Teams include your primary Cardiologist (physician) and Advanced Practice Providers (APPs -  Physician Assistants and Nurse Practitioners) who all work together to provide you with the care you need, when you need it.  We recommend signing up for the patient portal called "MyChart".  Sign up information is provided on this After Visit Summary.  MyChart is used to connect with patients for Virtual Visits (Telemedicine).  Patients are able to view lab/test results, encounter notes, upcoming appointments, etc.  Non-urgent messages can be sent to your provider as well.   To learn more about what you can do with MyChart, go to [email or web address].    Your next appointment:   1 year(s)  Provider:   Brian Crenshaw, MD   

## 2022-07-20 DIAGNOSIS — I890 Lymphedema, not elsewhere classified: Secondary | ICD-10-CM | POA: Diagnosis not present

## 2022-07-27 ENCOUNTER — Ambulatory Visit: Payer: Self-pay

## 2022-07-27 NOTE — Patient Outreach (Signed)
  Care Coordination   Follow Up Visit Note   07/27/2022 Name: Phinley Schall MRN: [id number] DOB: [birth year]  Lateefah Mallery is a 47 y.o. year old female who sees Blair Heys, MD for primary care. I spoke with  Erin Fulling by phone today.  What matters to the patients health and wellness today?  Follow up call with patient and mother ( caregiver)  Patient reports that she is doing well. No new problems or concerns today.  Mother states patient is self monitoring BP and today's reading was 121/93. Decrease swelling in her legs.  Wearing compression machine daily.  Mother reports that patient is helping around the house with making bed and doing dishes.     Goals Addressed             This Visit's Progress    Care Coordination Activities- self management of hypertension       Interventions Today    Flowsheet Row Most Recent Value  Chronic Disease   Chronic disease during today's visit Hypertension (HTN)  General Interventions   General Interventions Discussed/Reviewed General Interventions Discussed  Exercise Interventions   Exercise Discussed/Reviewed Physical Activity  Physical Activity Discussed/Reviewed Physical Activity Discussed  Nutrition Interventions   Nutrition Discussed/Reviewed Nutrition Discussed, Decreasing salt  Pharmacy Interventions   Pharmacy Dicussed/Reviewed Medications and their functions              SDOH assessments and interventions completed:  No     Care Coordination Interventions:  Yes, provided   Follow up plan: Follow up call scheduled for 08/27/2022    Encounter Outcome:  Pt. Visit Completed   Rowe Pavy, RN, BSN, CEN [facility] Coordinator [phone]

## 2022-08-04 DIAGNOSIS — I10 Essential (primary) hypertension: Secondary | ICD-10-CM | POA: Diagnosis not present

## 2022-08-09 DIAGNOSIS — I63412 Cerebral infarction due to embolism of left middle cerebral artery: Secondary | ICD-10-CM | POA: Diagnosis not present

## 2022-08-09 DIAGNOSIS — M00062 Staphylococcal arthritis, left knee: Secondary | ICD-10-CM | POA: Diagnosis not present

## 2022-08-09 DIAGNOSIS — R269 Unspecified abnormalities of gait and mobility: Secondary | ICD-10-CM | POA: Diagnosis not present

## 2022-08-27 ENCOUNTER — Ambulatory Visit: Payer: Self-pay

## 2022-08-27 NOTE — Patient Outreach (Signed)
  Care Coordination   Follow Up Visit Note   08/27/2022 Name: Debra Klein MRN: [id number] DOB: [birth year]  Debra Klein is a 47 y.o. year old female who sees Blair Heys, MD for primary care. I spoke with  Erin Fulling by phone today.  What matters to the patients health and wellness today?  Spoke with mother and patient on the phone.  They report patient is doing well.  No new changes. Mother reports BP is doing well. Todays BP 122/87. Patients mother is her caregiver and wants to know how to get that approved with her medicaid.    Goals Addressed             This Visit's Progress    Care Coordination Activities- self management of hypertension       Interventions Today    Flowsheet Row Most Recent Value  Chronic Disease   Chronic disease during today's visit Hypertension (HTN)  General Interventions   General Interventions Discussed/Reviewed General Interventions Discussed  Nutrition Interventions   Nutrition Discussed/Reviewed Nutrition Discussed, Decreasing salt  Pharmacy Interventions   Pharmacy Dicussed/Reviewed Medications and their functions      Referral placed for [facility] social worker to call to discuss medicaid and the process of patients mother being her caregiver.         SDOH assessments and interventions completed:  No     Care Coordination Interventions:  Yes, provided   Follow up plan: Follow up call scheduled for 10/08/2022, social worker referral placed.     Encounter Outcome:  Pt. Visit Completed   Debra Pavy, RN, BSN, CEN [facility] Coordinator [phone]

## 2022-08-31 ENCOUNTER — Ambulatory Visit: Payer: Self-pay | Admitting: Licensed Clinical Social Worker

## 2022-08-31 NOTE — Patient Instructions (Signed)
Visit Information  Thank you for taking time to visit with me today. Please don't hesitate to contact me if I can be of assistance to you.   Following are the goals we discussed today:   Goals Addressed             This Visit's Progress    Patient needs daily care with care needs:  meal preparation, bathing, transport, medicatoin administration       Interventions:  Spoke with [name], mother of client, via phone about client needs Discussed program support for client Discussed support with DSS [place], [place]. Encouraged [name] to call DSS [facility], [place] to ask Adult Medicaid Caseworker questions about Medicaid for client.  LCSW provided [name] with name, address, and phone number of DSS [facility], [place] Discussed client needs. [name] helps client with all client aspects of care.   Discussed client insurance. Client has UHC and Medicaid (Dual Complete). Discussed with [name] client use of U Card benefits. Discussed pain issues of client, sleeping issues of client and appetite of client Discussed transport needs of client. Izzabelle Veltri transports client to and from client medical appointments [name] LCSW name and phone number [phone])  Encouraged Genowefa Elkhatib to call LCSW as needed to discuss  SW needs of client        Our next appointment is by telephone on 10/19/22 at 9:30 AM   Please call the care guide team at [phone] if you need to cancel or reschedule your appointment.   If you are experiencing a Mental Health or Behavioral Health Crisis or need someone to talk to, please go to [facility] [address], [place] [phone])   The patient / Debra Klein, mother, verbalized understanding of instructions, educational materials, and care plan provided today and DECLINED offer to receive copy of patient instructions, educational materials, and care plan.   The patient / Debra Klein, mother, has been provided with  contact information for the care management team and has been advised to call with any health related questions or concerns.   [name].Nadelyn Enriques MSW, LCSW Licensed Visual merchandiser [facility] Care Management [phone]

## 2022-08-31 NOTE — Patient Outreach (Signed)
  Care Coordination   Initial Visit Note   08/31/2022 Name: Debra Klein MRN: [id number] DOB: [birth year]  Debra Klein is a 47 y.o. year old female who sees Blair Heys, MD for primary care. I spoke with  Erin Fulling / [name], mother of client , via phone today about client needs.   What matters to the patients health and wellness today? Patient needs daily care with care needs:  meal preparatoin, bathing, transport, medication administration    Goals Addressed             This Visit's Progress    Patient needs daily care with care needs:  meal preparation, bathing, transport, medicatoin administration       Interventions:  Spoke with [name], mother of client, via phone about client needs Discussed program support for client Discussed support with DSS [place], [place]. Encouraged [name] to call DSS [facility], [place] to ask Adult Medicaid Caseworker questions about Medicaid for client.  LCSW provided [name] with name, address, and phone number of DSS [facility], [place] Discussed client needs. [name] helps client with all client aspects of care.   Discussed client insurance. Client has UHC and Medicaid (Dual Complete). Discussed with [name] client use of U Card benefits. Discussed pain issues of client, sleeping issues of client and appetite of client Discussed transport needs of client. [name] transports client to and from client medical appointments [name] LCSW name and phone number [phone])  Encouraged [name] to call LCSW as needed to discuss  SW needs of client        SDOH assessments and interventions completed:  Yes  SDOH Interventions Today    Flowsheet Row Most Recent Value  SDOH Interventions   Depression Interventions/Treatment  --  [name] program support informatoin with mother of client]  Physical Activity Interventions Other (Comments)  [risk of social isolatoin. occasional pain issues]  Stress Interventions Other (Comment)   [stress in managing needs. stress in managing medical needs]        Care Coordination Interventions:  Yes, provided    Interventions Today    Flowsheet Row Most Recent Value  Chronic Disease   Chronic disease during today's visit Other  [spoke with mother of client about client needs]  General Interventions   General Interventions Discussed/Reviewed General Interventions Discussed, [name] program support with LCSW, Charity fundraiser, Pharmacist]  Exercise Interventions   Exercise Discussed/Reviewed Physical Activity  Education Interventions   Education Provided Provided Education  Provided Verbal Education On [employer]  Mental Health Interventions   Mental Health Discussed/Reviewed Coping Strategies  [client has diagnosis of schizophrenia]  Nutrition Interventions   Nutrition Discussed/Reviewed Nutrition Discussed  Pharmacy Interventions   Pharmacy Dicussed/Reviewed Pharmacy Topics Discussed       Follow up plan: Follow up call scheduled for 10/19/22 at 9:30 AM    Encounter Outcome:  Pt. Visit Completed   [name].[name] MSW, LCSW Licensed Visual merchandiser [facility] Care Management [phone]

## 2022-09-09 DIAGNOSIS — I63412 Cerebral infarction due to embolism of left middle cerebral artery: Secondary | ICD-10-CM | POA: Diagnosis not present

## 2022-09-09 DIAGNOSIS — R269 Unspecified abnormalities of gait and mobility: Secondary | ICD-10-CM | POA: Diagnosis not present

## 2022-09-09 DIAGNOSIS — M00062 Staphylococcal arthritis, left knee: Secondary | ICD-10-CM | POA: Diagnosis not present

## 2022-10-05 ENCOUNTER — Ambulatory Visit: Payer: 59

## 2022-10-05 NOTE — Patient Outreach (Signed)
Care Coordination   Follow Up Visit Note   10/05/2022 Name: Debra Klein MRN: [id number] DOB: 1975-05-31  Debra Klein is a 47 y.o. year old female who sees Blair Heys, MD for primary care. I spoke with  Erin Fulling by phone today.  What matters to the patients health and wellness today?  Spoke with patient and mother. Mother reports that patient is doing well. Reports BP 120/80. Mother reports that patient is taking her medications as prescribed.  Mother denies any additional needs at this time.  Mother reports that patient and she have been talking to a case Production designer, theatre/television/film at the MD office.       Goals Addressed             This Visit's Progress    Care Coordination Activities- self management of hypertension       Interventions Today    Flowsheet Row Most Recent Value  Chronic Disease   Chronic disease during today's visit Hypertension (HTN)  General Interventions   General Interventions Discussed/Reviewed General Interventions Discussed  Education Interventions   Education Provided Provided Education  [Reviewed importance of patient continuing to take her medications as prescribed.  Review with mother that recent BP are normal.  Encouraged mother to call MD office in the future for needs.]  Provided Verbal Education On Nutrition, When to see the doctor, Medication  Mental Health Interventions   Mental Health Discussed/Reviewed Other  [offered a listening ear for patients mother.]  Pharmacy Interventions   Pharmacy Dicussed/Reviewed Medications and their functions                SDOH assessments and interventions completed:  No     Care Coordination Interventions:  Yes, provided   Follow up plan: No further intervention required.   Encounter Outcome:  Patient Visit Completed   Lonia Chimera, RN, BSN, CEN [facility] Coordinator [phone]

## 2022-10-05 NOTE — Patient Outreach (Signed)
Care Coordination   Follow Up Visit Note   10/05/2022 Name: Selin Hoppman MRN: [id number] DOB: [birth year]  Shecid Wassmann is a 47 y.o. year old female who sees Blair Heys, MD for primary care. I spoke with  Erin Fulling by phone today.  What matters to the patients health and wellness today?  Mother reports that patient is doing well. Reports BP is normal. No changes to medications. 120/84 recently.  Mother reports that they have talking to case manager at the MD office.  Mother denies any additional needs at this time.    Goals Addressed             This Visit's Progress    COMPLETED: Care Coordination Activities- self management of hypertension       Interventions Today    Flowsheet Row Most Recent Value  Chronic Disease   Chronic disease during today's visit Hypertension (HTN)  General Interventions   General Interventions Discussed/Reviewed General Interventions Discussed  Education Interventions   Education Provided Provided Education  [Reviewed importance of patient continuing to take her medications as prescribed.  Review with mother that recent BP are normal.  Encouraged mother to call MD office in the future for needs.]  Provided Verbal Education On Nutrition, When to see the doctor, Medication  Mental Health Interventions   Mental Health Discussed/Reviewed Other  [offered a listening ear for patients mother.]  Pharmacy Interventions   Pharmacy Dicussed/Reviewed Medications and their functions              SDOH assessments and interventions completed:  No     Care Coordination Interventions:  Yes, provided   Follow up plan: No further intervention required.   Encounter Outcome:  Patient Visit Completed   Lonia Chimera, RN, BSN, CEN [facility] Coordinator [phone]

## 2022-10-08 ENCOUNTER — Ambulatory Visit: Payer: 59

## 2022-10-08 ENCOUNTER — Telehealth: Payer: Self-pay | Admitting: *Deleted

## 2022-10-08 NOTE — Progress Notes (Signed)
Care Coordination Note  10/08/2022 Name: Teagin Christoffel MRN: [id number] DOB: Oct 14, 1975  Nory Nygaard is a 47 y.o. year old female who is a primary care patient of Blair Heys, MD and is actively engaged with the care management team. I reached out to Erin Fulling mother [name] by phone today to assist with re-scheduling a follow up visit with the Licensed Clinical Social Worker  Follow up plan: Unsuccessful telephone outreach attempt made. A HIPAA compliant phone message was left for the patient providing contact information and requesting a return call.   [facility]  Care Coordination Care Guide  Direct Dial: [phone]

## 2022-10-09 NOTE — Progress Notes (Unsigned)
Care Coordination Note  10/09/2022 Name: Debra Klein MRN: [id number] DOB: Aug 27, 1975  Debra Klein is a 47 y.o. year old female who is a primary care patient of Blair Heys, MD and is actively engaged with the care management team. I reached out to Erin Fulling by phone today to assist with re-scheduling a follow up visit with the RN Case Manager  Follow up plan: Unsuccessful telephone outreach attempt made. A HIPAA compliant phone message was left for the patient providing contact information and requesting a return call.   [facility]  Care Coordination Care Guide  Direct Dial: [phone]

## 2022-10-10 DIAGNOSIS — I63412 Cerebral infarction due to embolism of left middle cerebral artery: Secondary | ICD-10-CM | POA: Diagnosis not present

## 2022-10-10 DIAGNOSIS — R269 Unspecified abnormalities of gait and mobility: Secondary | ICD-10-CM | POA: Diagnosis not present

## 2022-10-10 DIAGNOSIS — M00062 Staphylococcal arthritis, left knee: Secondary | ICD-10-CM | POA: Diagnosis not present

## 2022-10-12 NOTE — Progress Notes (Signed)
Care Coordination Note  10/12/2022 Name: Debra Klein MRN: [id number] DOB: 11/30/1975  Debra Klein is a 47 y.o. year old female who is a primary care patient of Blair Heys, MD and is actively engaged with the care management team. I reached out to Erin Fulling by phone today to assist with re-scheduling a follow up visit with the Licensed Clinical Social Worker  Follow up plan: Unsuccessful telephone outreach attempt made. A HIPAA compliant phone message was left for the patient providing contact information and requesting a return call. We have been unable to make contact with the patient for follow up. The care management team is available to follow up with the patient after provider conversation with the patient regarding recommendation for care management engagement and subsequent re-referral to the care management team.   [facility] Coordination Care Guide  Direct Dial: [phone]

## 2022-10-19 ENCOUNTER — Encounter: Payer: 59 | Admitting: Licensed Clinical Social Worker

## 2022-10-27 ENCOUNTER — Encounter: Payer: 59 | Admitting: Licensed Clinical Social Worker

## 2022-11-06 ENCOUNTER — Telehealth: Payer: Self-pay | Admitting: *Deleted

## 2022-11-06 NOTE — Progress Notes (Signed)
Care Coordination Note  11/06/2022 Name: Kourtney Knapper MRN: [id number] DOB: 18-Sep-1975  Jekia Amer is a 47 y.o. year old female who is a primary care patient of Ehinger, Molly Maduro, MD (Inactive) and is actively engaged with the care management team.  Erin Fulling mother Aleiza Hammitt contacted me via phone today to assist with re-scheduling a follow up visit with the Licensed Clinical Social Worker  Follow up plan: Telephone appointment with care management team member scheduled for:11/4  [facility]  Care Coordination Care Guide  Direct Dial: [phone]

## 2022-11-14 DIAGNOSIS — Z23 Encounter for immunization: Secondary | ICD-10-CM | POA: Diagnosis not present

## 2022-11-16 ENCOUNTER — Ambulatory Visit: Payer: Self-pay | Admitting: Licensed Clinical Social Worker

## 2022-11-16 NOTE — Patient Outreach (Signed)
  Care Coordination   Follow Up Visit Note   11/16/2022 Name: Debra Klein MRN: [id number] DOB: November 19, 1975  Debra Klein is a 47 y.o. year old female who sees Blair Heys, MD (Inactive) for primary care. I spoke with  Erin Fulling [name], mother and contact of client, via  phone today.  What matters to the patients health and wellness today?  Patient needs daily care with care needs:  meal preparation, bathing, transport, medicatoin administration     Goals Addressed             This Visit's Progress    Patient needs daily care with care needs:  meal preparation, bathing, transport, medicatoin administration       Interventions:  Spoke with [name], mother of client, via phone today about client status and needs Discussed program support for client Discussed support with DSS [place], [place]. Encouraged [name] to call DSS [facility], [place] to ask Adult Medicaid Caseworker questions about Medicaid for client.  LCSW previously provided [name] with name, address, and phone number of DSS [place], [place] . [name] said client needed replacement Medicaid ID card. LCSW encouraged [name] to call DSS [facility] and request replacement Medicaid ID card to be mailed to client. [name] said she would call DSS [facility], [place] to request replacement Medicaid ID card for client Discussed client needs. [name] helps client with all client aspects of care (ADLs, transport, meals, medication administration) Discussed client insurance. Client has UHC and Medicaid (Dual Complete). Discussed with [name] client use of U Card benefits. [name] said use of U card was very helpful to client Discussed pain issues of client, sleeping issues of client and appetite of client Discussed transport needs of client. [name] transports client to and from client medical appointments [name] LCSW name and phone number [phone])  Encouraged [name] to call LCSW as needed to discuss  SW  needs of client [name] was appreciative of call today from LCSW        SDOH assessments and interventions completed:  Yes  SDOH Interventions Today    Flowsheet Row Most Recent Value  SDOH Interventions   Depression Interventions/Treatment  Counseling  Physical Activity Interventions Other (Comments)  [client may be inactive occasionally]  Stress Interventions Other (Comment)  [client is care dependent on her mother. client needs help with ADLs, meals, medication administration, transport]        Care Coordination Interventions:  Yes, provided  Interventions Today    Flowsheet Row Most Recent Value  Chronic Disease   Chronic disease during today's visit Other  [spoke with [name], mother of client, about client needs]  General Interventions   General Interventions Discussed/Reviewed General Interventions Discussed, Community Resources  Education Interventions   Education Provided Provided Education  Provided Verbal Education On [employer]  [discussed support with DSS [employer] [place]]  Mental Health Interventions   Mental Health Discussed/Reviewed Coping Strategies  Nutrition Interventions   Nutrition Discussed/Reviewed Nutrition Discussed  Pharmacy Interventions   Pharmacy Dicussed/Reviewed Pharmacy Topics Discussed        Follow up plan: Follow up call scheduled for 12/15/22 at 10:30 AM    Encounter Outcome:  Patient Visit Completed   [name].Loida Calamia MSW, LCSW Licensed Visual merchandiser [facility] Care Management [phone]

## 2022-11-16 NOTE — Patient Outreach (Signed)
  Care Coordination   11/16/2022 Name: Debra Klein MRN: [id number] DOB: 10/29/1975   Care Coordination Outreach Attempts:  An unsuccessful telephone outreach was attempted today to offer the patient information about available care coordination services.  Follow Up Plan:  Additional outreach attempts will be made to offer the patient care coordination information and services.   Encounter Outcome:  No Answer   Care Coordination Interventions:  No, not indicated    [name].Vern Guerette MSW, LCSW Licensed Visual merchandiser [facility] Care Management [phone]

## 2022-11-16 NOTE — Patient Instructions (Signed)
Visit Information  Thank you for taking time to visit with me today. Please don't hesitate to contact me if I can be of assistance to you.   Following are the goals we discussed today:   Goals Addressed             This Visit's Progress    Patient needs daily care with care needs:  meal preparation, bathing, transport, medicatoin administration       Interventions:  Spoke with [name], mother of client, via phone today about client status and needs Discussed program support for client Discussed support with DSS [place], [place]. Encouraged [name] to call DSS [facility], [place] to ask Adult Medicaid Caseworker questions about Medicaid for client.  LCSW previously provided [name] with name, address, and phone number of DSS [place], [place] . [name] said client needed replacement Medicaid ID card. LCSW encouraged [name] to call DSS [facility] and request replacement Medicaid ID card to be mailed to client. [name] said she would call DSS [facility], [place] to request replacement Medicaid ID card for client Discussed client needs. [name] helps client with all client aspects of care (ADLs, transport, meals, medication administration) Discussed client insurance. Client has UHC and Medicaid (Dual Complete). Discussed with [name] client use of U Card benefits. [name] said use of U card was very helpful to client Discussed pain issues of client, sleeping issues of client and appetite of client Discussed transport needs of client. [name] transports client to and from client medical appointments [name] LCSW name and phone number [phone])  Encouraged [name] to call LCSW as needed to discuss  SW needs of client [name] was appreciative of call today from LCSW        Our next appointment is by telephone on 12/15/22 at 10:30 AM   Please call the care guide team at [phone] if you need to cancel or reschedule your appointment.   If you are experiencing a Mental Health or  Behavioral Health Crisis or need someone to talk to, please go to [facility] [address] [place] [phone])   The patient / Debra Klein, mother, verbalized understanding of instructions, educational materials, and care plan provided today and DECLINED offer to receive copy of patient instructions, educational materials, and care plan.   The patient / Debra Klein, mother, has been provided with contact information for the care management team and has been advised to call with any health related questions or concerns.   [name].[name] MSW, LCSW Licensed Visual merchandiser [facility] Care Management [phone]

## 2022-12-15 ENCOUNTER — Ambulatory Visit: Payer: Self-pay | Admitting: Licensed Clinical Social Worker

## 2022-12-15 NOTE — Patient Instructions (Signed)
Visit Information  Thank you for taking time to visit with me today. Please don't hesitate to contact me if I can be of assistance to you.   Following are the goals we discussed today:   Goals Addressed             This Visit's Progress    Patient needs daily care with care needs:  meal preparation, bathing, transport, medicatoin administration       Interventions:  Spoke with [name], mother of client, via phone today about client status and needs Discussed program support for client with RN, LCSW, Pharmacist Discussed client needs. [name] helps client with all client aspects of care (ADLs, transport, meals, medication administration). Discussed with [name] setting up a daily schedule and giving [name] assigned tasks to complete that [name] was able to complete successfully. [name] thought this was good idea.  [name] said that client often will ask [name] to do tasks for client that client could do for herself. [name] spoke of caregiver fatigue Discussed Day Program in area where client used to attend [facility]) [name] will contact [facility] to see if [name] could be evaluated again to see if she [place]) might qualify to start attending Day Program there again. Discussed client insurance. Client has UHC and Medicaid (Dual Complete). Discussed with [name] client use of U Card benefits. [name] said use of U card was very helpful to client Discussed sleeping issues of client and appetite of client Discussed transport needs of client. Steffi Barsness transports client to and from client medical appointments Discussed hobbies of client .Client likes to watch TV, watch ballet, and likes to listen to music  Discussed medication procurement [name] LCSW name and phone number [phone])  Encouraged [name] to call LCSW as needed to discuss  SW needs of client [name] was appreciative of call today from LCSW          Our next appointment is by telephone on 02/08/23 at 9:00 AM    Please call the care guide team at [phone] if you need to cancel or reschedule your appointment.   If you are experiencing a Mental Health or Behavioral Health Crisis or need someone to talk to, please go to [facility] [address] [place] [phone])   The patient Debra Klein, mother of patient,  verbalized understanding of instructions, educational materials, and care plan provided today and DECLINED offer to receive copy of patient instructions, educational materials, and care plan.   The patient / Debra Klein, mother of patient, has been provided with contact information for the care management team and has been advised to call with any health related questions or concerns.   [name].Dario Yono MSW, LCSW Licensed Visual merchandiser [facility] Care Management [phone]

## 2022-12-15 NOTE — Patient Outreach (Signed)
Care Coordination   Follow Up Visit Note   12/15/2022 Name: Debra Klein MRN: [id number] DOB: 07/11/1975  Debra Klein is a 47 y.o. year old female who sees Debra Heys, MD (Inactive) for primary care. I spoke with  Debra Klein, mother of client via phone today.  What matters to the patients health and wellness today?  Patient needs daily care with care needs:  meal preparation, bathing, transport, medicatoin administration    Goals Addressed             This Visit's Progress    Patient needs daily care with care needs:  meal preparation, bathing, transport, medicatoin administration       Interventions:  Spoke with Debra Klein, mother of client, via phone today about client status and needs Discussed program support for client with RN, LCSW, Pharmacist Discussed client needs. Debra Klein helps client with all client aspects of care (ADLs, transport, meals, medication administration). Discussed with Debra Klein setting up a daily schedule and giving Debra Klein assigned tasks to complete that Debra Klein was able to complete successfully. Debra Klein thought this was good idea.  Debra Klein said that client often will ask Debra Klein to do tasks for client that client could do for herself. Debra Klein spoke of caregiver fatigue Discussed Day Program in area where client used to attend Debra Klein) Debra Klein will contact Debra Klein, Debra Klein to see if Debra Klein could be evaluated again to see if she Debra Klein) might qualify to start attending Day Program there again. Discussed client insurance. Client has UHC and Medicaid (Dual Complete). Discussed with Debra Klein client use of U Card benefits. Debra Klein said use of U card was very helpful to client Discussed sleeping issues of client and appetite of client Discussed transport needs of client. Debra Klein transports client to and from client medical appointments Discussed hobbies of client .Client likes to watch TV, watch ballet, and likes to listen to music  Discussed medication  procurement Debra Pillar LCSW name and phone number [phone])  Encouraged Debra Klein to call LCSW as needed to discuss  SW needs of client Debra Klein was appreciative of call today from LCSW          SDOH assessments and interventions completed:  Yes  SDOH Interventions Today    Flowsheet Row Most Recent Value  SDOH Interventions   Depression Interventions/Treatment  Counseling  Physical Activity Interventions Other (Comments)  [client has decreased activities]  Stress Interventions Other (Comment)  [client is dependent on mother for care,  client has low motivation to do things on her own, that she may be able to do]        Care Coordination Interventions:  Yes, provided   Interventions Today    Flowsheet Row Most Recent Value  Chronic Disease   Chronic disease during today's visit Other  [spoke with Debra Klein, mother of client, about client needs]  General Interventions   General Interventions Discussed/Reviewed General Interventions Discussed, Community Resources  Education Interventions   Education Provided Provided Education  Provided Verbal Education On [employer]  [discussed client past particiation in Day Program in the area]  Mental Health Interventions   Mental Health Discussed/Reviewed Coping Strategies  [client has reduced sleep]  Nutrition Interventions   Nutrition Discussed/Reviewed Nutrition Discussed  Pharmacy Interventions   Pharmacy Dicussed/Reviewed Pharmacy Topics Discussed        Follow up plan: Follow up call scheduled for 02/08/23 at 9:00 AM     Encounter Outcome:  Patient Visit Completed   Debra Klein.Debra Klein MSW, LCSW Licensed Clinical  Social Worker [facility] Care Management [phone]

## 2022-12-23 DIAGNOSIS — I10 Essential (primary) hypertension: Secondary | ICD-10-CM | POA: Diagnosis not present

## 2022-12-23 DIAGNOSIS — E7800 Pure hypercholesterolemia, unspecified: Secondary | ICD-10-CM | POA: Diagnosis not present

## 2022-12-23 DIAGNOSIS — R7303 Prediabetes: Secondary | ICD-10-CM | POA: Diagnosis not present

## 2022-12-25 ENCOUNTER — Other Ambulatory Visit: Payer: Self-pay | Admitting: Family Medicine

## 2022-12-25 DIAGNOSIS — I10 Essential (primary) hypertension: Secondary | ICD-10-CM | POA: Diagnosis not present

## 2022-12-25 DIAGNOSIS — R809 Proteinuria, unspecified: Secondary | ICD-10-CM | POA: Diagnosis not present

## 2022-12-25 DIAGNOSIS — I6990 Unspecified sequelae of unspecified cerebrovascular disease: Secondary | ICD-10-CM | POA: Diagnosis not present

## 2022-12-25 DIAGNOSIS — D649 Anemia, unspecified: Secondary | ICD-10-CM | POA: Diagnosis not present

## 2022-12-25 DIAGNOSIS — Z8673 Personal history of transient ischemic attack (TIA), and cerebral infarction without residual deficits: Secondary | ICD-10-CM | POA: Diagnosis not present

## 2022-12-25 DIAGNOSIS — E7800 Pure hypercholesterolemia, unspecified: Secondary | ICD-10-CM | POA: Diagnosis not present

## 2022-12-25 DIAGNOSIS — I69359 Hemiplegia and hemiparesis following cerebral infarction affecting unspecified side: Secondary | ICD-10-CM | POA: Diagnosis not present

## 2022-12-25 DIAGNOSIS — Z23 Encounter for immunization: Secondary | ICD-10-CM | POA: Diagnosis not present

## 2022-12-25 DIAGNOSIS — Z0000 Encounter for general adult medical examination without abnormal findings: Secondary | ICD-10-CM | POA: Diagnosis not present

## 2022-12-25 DIAGNOSIS — Z1231 Encounter for screening mammogram for malignant neoplasm of breast: Secondary | ICD-10-CM

## 2022-12-25 DIAGNOSIS — K219 Gastro-esophageal reflux disease without esophagitis: Secondary | ICD-10-CM | POA: Diagnosis not present

## 2022-12-25 DIAGNOSIS — R7303 Prediabetes: Secondary | ICD-10-CM | POA: Diagnosis not present

## 2023-01-11 DIAGNOSIS — N6452 Nipple discharge: Secondary | ICD-10-CM | POA: Diagnosis not present

## 2023-01-22 ENCOUNTER — Ambulatory Visit
Admission: RE | Admit: 2023-01-22 | Discharge: 2023-01-22 | Disposition: A | Discharge status: Home / Self Care | Payer: 59 | Source: Ambulatory Visit | Attending: Family Medicine | Admitting: Family Medicine

## 2023-01-22 DIAGNOSIS — Z1231 Encounter for screening mammogram for malignant neoplasm of breast: Secondary | ICD-10-CM

## 2023-02-08 ENCOUNTER — Ambulatory Visit: Payer: Self-pay | Admitting: Licensed Clinical Social Worker

## 2023-02-08 NOTE — Patient Outreach (Signed)
  Care Coordination   Follow Up Visit Note   02/08/2023 Name: Debra Klein MRN: [id number] DOB: 26-Nov-1975  Debra Klein is a 48 y.o. year old female who sees Blair Heys, MD (Inactive) for primary care. I spoke with  Erin Fulling / [name], mother of client, via phone today.  What matters to the patients health and wellness today? Patient needs daily care with care needs:  meal preparation, bathing, transport, medicatoin administration    Goals Addressed             This Visit's Progress    Patient needs daily care with care needs:  meal preparation, bathing, transport, medicatoin administration       Interventions:  Spoke with [name], mother of client, via phone today about client status and needs Discussed program support for client with RN, LCSW, Pharmacist. Encouraged client to access program support Discussed appetite of client. Client is eating well. Discussed sleeping of client. Client is sleeping well Discussed medication procurement Discussed social activities of client. Client likes to do activities outside of the home.  Discussed with [name] taking client on short outings to do errands during the day to allow client to be outside of the home Discussed pain issues of client [name] said client had a physical with her PCP last month Discussed ambulation of client. [name] said client uses a walker to help her walk when client leaves the home to do an activitiy Discussed Medicaid support for client. Encouraged [name] to call DSS Medicaid worker to talk with caseworker about Medicaid status of client Discussed transport needs of client. [employer] transports client to and from client medical appointments Encouraged [name] or client to call LCSW as needed for SW support for client at [phone]          SDOH assessments and interventions completed:  Yes   SDOH Interventions Today    Flowsheet Row Most Recent Value  SDOH Interventions   Depression  Interventions/Treatment  Counseling  Physical Activity Interventions Other (Comments)  [uses a walker to help her walk]  Stress Interventions Other (Comment)  [has stress in managing daily activities]        Care Coordination Interventions:  Yes, provided    Interventions Today    Flowsheet Row Most Recent Value  Chronic Disease   Chronic disease during today's visit Other  [spoke with [name], mother of client, about client needs]  General Interventions   General Interventions Discussed/Reviewed General Interventions Discussed, Community Resources  Education Interventions   Education Provided Provided Education  Provided Verbal Education On [employer]  Mental Health Interventions   Mental Health Discussed/Reviewed Coping Strategies  [client likes to do activities outside of the home]  Nutrition Interventions   Nutrition Discussed/Reviewed Nutrition Discussed  Pharmacy Interventions   Pharmacy Dicussed/Reviewed Pharmacy Topics Discussed  Safety Interventions   Safety Discussed/Reviewed Fall Risk       Follow up plan: Follow up call scheduled for 04/06/23 at 1:30 PM     Encounter Outcome:  Patient Visit Completed    Lorna Few  MSW, LCSW [place] Licensed Clinical Social Worker Direct Dial:  [phone] Fax:  [phone] Website:  [email or web address]

## 2023-02-08 NOTE — Patient Instructions (Signed)
Visit Information  Thank you for taking time to visit with me today. Please don't hesitate to contact me if I can be of assistance to you.   Following are the goals we discussed today:   Goals Addressed             This Visit's Progress    Patient needs daily care with care needs:  meal preparation, bathing, transport, medicatoin administration       Interventions:  Spoke with [name], mother of client, via phone today about client status and needs Discussed program support for client with RN, LCSW, Pharmacist. Encouraged client to access program support Discussed appetite of client. Client is eating well. Discussed sleeping of client. Client is sleeping well Discussed medication procurement Discussed social activities of client. Client likes to do activities outside of the home.  Discussed with [name] taking client on short outings to do errands during the day to allow client to be outside of the home Discussed pain issues of client [name] said client had a physical with her PCP last month Discussed ambulation of client. [name] said client uses a walker to help her walk when client leaves the home to do an activitiy Discussed Medicaid support for client. Encouraged [name] to call DSS Medicaid worker to talk with caseworker about Medicaid status of client Discussed transport needs of client. Erianna Jolly transports client to and from client medical appointments Encouraged [name] or client to call LCSW as needed for SW support for client at [phone]          Our next appointment is by telephone on 04/06/23 at 1:30 PM   Please call the care guide team at [phone] if you need to cancel or reschedule your appointment.   If you are experiencing a Mental Health or Behavioral Health Crisis or need someone to talk to, please go to [facility] [address] [place] [phone])   The patient / Debra Klein, mother, verbalized understanding  of instructions, educational materials, and care plan provided today and DECLINED offer to receive copy of patient instructions, educational materials, and care plan.   The patient / Debra Klein, mother ,has been provided with contact information for the care management team and has been advised to call with any health related questions or concerns.    Lorna Few  MSW, LCSW [place] Licensed Clinical Social Worker Direct Dial:  [phone] Fax:  [phone] Website:  [email or web address]

## 2023-03-03 ENCOUNTER — Ambulatory Visit: Payer: Self-pay | Admitting: Licensed Clinical Social Worker

## 2023-03-03 NOTE — Patient Outreach (Signed)
Care Coordination   Follow Up Visit Note   03/03/2023 Name: Debra Klein MRN: [id number] DOB: [birth year]  Debra Klein is a 48 y.o. year old female who sees Blair Heys, MD (Inactive) for primary care. I spoke with  Erin Fulling / [name], mother of client, via  phone today.  What matters to the patients health and wellness today?  Patient needs daily care with care needs:  meal preparation, bathing, transport, medicatoin administration    Goals Addressed             This Visit's Progress    Patient needs daily care with care needs:  meal preparation, bathing, transport, medicatoin administration       Interventions:  Spoke with [name], mother of client, via phone today about client status and needs Discussed program support for client with RN, LCSW, Pharmacist. Encouraged client to access program support Discussed appetite of client. Client is eating well. [name] said that sometimes she and Anglea will work on meal preparation together for an activity Discussed sleeping of client. Client sometimes has reduced sleep per [name] Discussed medication procurement. Client sometimes forgets to take client medications; [name] said she reminds Bentli to take client medications as prescribed Discussed social activities of client. Client likes to do activities outside of the home.  Client recently had hair appointment and went to eat at a restaurant. Discussed with [name] taking client on short outings to do errands during the day to allow client to be outside of the home Discussed pain issues of client [name] said Dr. Manus Gunning retired and so now client is seeing Dr. Lewie Chamber at the same practice. Discussed ambulation of client. [name] said client uses a walker to help her walk when client leaves the home to do an activitiy Discussed Medicaid support for client.  [name] asked about completing forms for being a paid caregiver for client. Encouraged [name] to call DSS Medicaid worker to  talk with caseworker about  mailing her forms to complete to apply to be paid caregiver for client.  Discussed transport needs of client. [name] transports client to and from client medical appointments Client is attending medical appointments as scheduled  Discussed self care  with [name] and talked with [name] about risk of caregiver fatigue or burnout.  Encouraged [name]  to give Adonai some daily tasks to complete to help with home chores and tasks. ( For example, folding laundry, helping with cooking, cleaning her room , setting table). [name] thought this was a good idea Educational psychologist for phone call with LCSW today Encouraged [name] or client to call LCSW as needed for SW support for client at [phone]. [name] was appreciative of call from LCSW today          SDOH assessments and interventions completed:  Yes  SDOH Interventions Today    Flowsheet Row Most Recent Value  SDOH Interventions   Depression Interventions/Treatment  Counseling  Physical Activity Interventions Other (Comments)  [client uses a walker, when outside of the home , to help her walk]  Stress Interventions Other (Comment)  [client has some stress in managing medical needs. She needs support with transport, meal preparation, taking medications, bathing]        Care Coordination Interventions:  Yes, provided    Interventions Today    Flowsheet Row Most Recent Value  Chronic Disease   Chronic disease during today's visit Other  [spoke with [name], mother of client, about client needs]  General Interventions   General  Interventions Discussed/Reviewed General Interventions Discussed, IT trainer Provided Provided Education  Provided Verbal Education On [employer]  Mental Health Interventions   Mental Health Discussed/Reviewed Coping Strategies  [client likes to watch TV, listen to music, go on activities outside of the home]  Nutrition  Interventions   Nutrition Discussed/Reviewed Nutrition Discussed  Pharmacy Interventions   Pharmacy Dicussed/Reviewed Pharmacy Topics Discussed  Safety Interventions   Safety Discussed/Reviewed Fall Risk       Follow up plan: LCSW to call [name] as scheduled to discuss care needs of client at that time   Encounter Outcome:  Patient Visit Completed    Lorna Few  MSW, LCSW [place] Licensed Clinical Social Worker Direct Dial:  [phone] Fax:  [phone] Website:  [email or web address]

## 2023-03-03 NOTE — Patient Instructions (Signed)
Visit Information  Thank you for taking time to visit with me today. Please don't hesitate to contact me if I can be of assistance to you.   Following are the goals we discussed today:   Goals Addressed             This Visit's Progress    Patient needs daily care with care needs:  meal preparation, bathing, transport, medicatoin administration       Interventions:  Spoke with [name], mother of client, via phone today about client status and needs Discussed program support for client with RN, LCSW, Pharmacist. Encouraged client to access program support Discussed appetite of client. Client is eating well. [name] said that sometimes she and Anglea will work on meal preparation together for an activity Discussed sleeping of client. Client sometimes has reduced sleep per [name] Discussed medication procurement. Client sometimes forgets to take client medications; [name] said she reminds Prezley to take client medications as prescribed Discussed social activities of client. Client likes to do activities outside of the home.  Client recently had hair appointment and went to eat at a restaurant. Discussed with [name] taking client on short outings to do errands during the day to allow client to be outside of the home Discussed pain issues of client [name] said Dr. Manus Gunning retired and so now client is seeing Dr. Lewie Chamber at the same practice. Discussed ambulation of client. [name] said client uses a walker to help her walk when client leaves the home to do an activitiy Discussed Medicaid support for client.  [name] asked about completing forms for being a paid caregiver for client. Encouraged [name] to call DSS Medicaid worker to talk with caseworker about  mailing her forms to complete to apply to be paid caregiver for client.  Discussed transport needs of client. [name] transports client to and from client medical appointments Client is attending medical appointments as scheduled  Discussed  self care  with [name] and talked with [name] about risk of caregiver fatigue or burnout.  Encouraged [name]  to give Aris some daily tasks to complete to help with home chores and tasks. ( For example, folding laundry, helping with cooking, cleaning her room , setting table). [name] thought this was a good idea Educational psychologist for phone call with LCSW today Encouraged [name] or client to call LCSW as needed for SW support for client at [phone]. [name] was appreciative of call from LCSW today         LCSW to call Kurt Azimi  as scheduled to discuss care needs of client at that time   Please call the care guide team at [phone] if you need to cancel or reschedule your appointment.   If you are experiencing a Mental Health or Behavioral Health Crisis or need someone to talk to, please go to [facility] [address] [place] [phone])   The patient/ Lurlean Kernen, mother,  verbalized understanding of instructions, educational materials, and care plan provided today and DECLINED offer to receive copy of patient instructions, educational materials, and care plan.   The patient / Jarelis Ehlert, mother, has been provided with contact information for the care management team and has been advised to call with any health related questions or concerns.    Lorna Few  MSW, LCSW [place] Licensed Clinical Social Worker Direct Dial:  [phone] Fax:  [phone] Website:  [email or web address]

## 2023-04-06 ENCOUNTER — Encounter: Payer: 59 | Admitting: Licensed Clinical Social Worker

## 2023-04-13 ENCOUNTER — Ambulatory Visit: Payer: Self-pay | Admitting: Licensed Clinical Social Worker

## 2023-04-13 NOTE — Patient Instructions (Signed)
 Visit Information  Thank you for taking time to visit with me today. Please don't hesitate to contact me if I can be of assistance to you.   Following are the goals we discussed today:   Goals Addressed             This Visit's Progress    Patient needs daily care with care needs:  meal preparation, bathing, transport, medicatoin administration       Interventions:  Spoke with [name], mother of client, via phone today about client status and needs Discussed program support for client with RN, LCSW, Pharmacist. Encouraged client to access program support Discussed appetite of client. Client is eating well. [name] said that sometimes she and Anglea will work on meal preparation together for an activity. Client likes eating pudding for a snack Discussed sleeping of client. Client sometimes has reduced sleep per [name] Discussed medication procurement of client Discussed social activities of client. Client likes to do activities outside of the home.  [name] said she and client went to a movie recently and that client enjoyed going to see a movie Client enjoys doing short outings away from the home occasionally Discussed pain issues of client [name] said Dr. Manus Gunning retired and so now client is seeing Dr. Lewie Chamber at the same practice. Discussed ambulation of client. [name] said client uses a walker to help her walk when client leaves the home to do an activitiy Discussed transport needs of client. Lakyra Tippins transports client to and from client medical appointments Client is attending medical appointments as scheduled  Encouraged [name]  to give Adya some daily tasks to complete to help with home chores and tasks. ( For example, folding laundry, helping with cooking, cleaning her room , setting table). [name] thought this was a good idea [name] said client uses machine to help with swelling reduction for her legs. She uses machine for about 1.5 hours daily. [name] said that use of machine helps  swelling to be reduced in legs of client. Client also uses compression stockings to help with swelling in client's legs [name] said client occasionally will have nightmares when sleeping Dion Saucier for phone call with LCSW today Encouraged [name] or client to call LCSW as needed for SW support for client at [phone]. [name] was appreciative of call from LCSW today         LCSW has provided [name] with LCSW name and phone number. LCSW has encouraged [name] to call LCSW as needed for SW support for client at [phone]  Please call the care guide team at [phone] if you need to cancel or reschedule your appointment.   If you are experiencing a Mental Health or Behavioral Health Crisis or need someone to talk to, please go to [facility] [address], [place] [phone])   The patient / Ricquel Foulk, mother of patient, verbalized understanding of instructions, educational materials, and care plan provided today and DECLINED offer to receive copy of patient instructions, educational materials, and care plan.   The patient/ Dashanna Kinnamon, mother of patient,  has been provided with contact information for the care management team and has been advised to call with any health related questions or concerns.    Lorna Few  MSW, LCSW [place] Licensed Clinical Social Worker Direct Dial:  [phone] Fax:  [phone] Website:  [email or web address]

## 2023-04-13 NOTE — Patient Outreach (Signed)
 Care Coordination   Follow Up Visit Note   04/13/2023 Name: Debra Klein MRN: [id number] DOB: 1975-08-02  Debra Klein is a 48 y.o. year old female who sees Blair Heys, MD (Inactive) for primary care. I spoke with  Erin Fulling / Debra Klein, mother of client, by phone today.  What matters to the patients health and wellness today?  Patient needs daily care with care needs:  meal preparation, bathing, transport, medicatoin administration    Goals Addressed             This Visit's Progress    Patient needs daily care with care needs:  meal preparation, bathing, transport, medicatoin administration       Interventions:  Spoke with Debra Klein, mother of client, via phone today about client status and needs Discussed program support for client with RN, LCSW, Pharmacist. Encouraged client to access program support Discussed appetite of client. Client is eating well. [name] said that sometimes she and Anglea will work on meal preparation together for an activity. Client likes eating pudding for a snack Discussed sleeping of client. Client sometimes has reduced sleep per Debra Klein Discussed medication procurement of client Discussed social activities of client. Client likes to do activities outside of the home.  [name] said she and client went to a movie recently and that client enjoyed going to see a movie Client enjoys doing short outings away from the home occasionally Discussed pain issues of client [name] said Dr. Manus Gunning retired and so now client is seeing Dr. Lewie Chamber at the same practice. Discussed ambulation of client. [name] said client uses a walker to help her walk when client leaves the home to do an activitiy Discussed transport needs of client. Makaiya Geerdes transports client to and from client medical appointments Client is attending medical appointments as scheduled  Encouraged [name]  to give [place] some daily tasks to complete to help with home chores and tasks. ( For  example, folding laundry, helping with cooking, cleaning her room , setting table). [name] thought this was a good idea [name] said client uses machine to help with swelling reduction for her legs. She uses machine for about 1.5 hours daily. [name] said that use of machine helps swelling to be reduced in legs of client. Client also uses compression stockings to help with swelling in client's legs [name] said client occasionally will have nightmares when sleeping Dion Saucier for phone call with LCSW today Encouraged [name] or client to call LCSW as needed for SW support for client at [phone]. [name] was appreciative of call from LCSW today          SDOH assessments and interventions completed:  Yes  SDOH Interventions Today    Flowsheet Row Most Recent Value  SDOH Interventions   Depression Interventions/Treatment  Counseling  Physical Activity Interventions Other (Comments)  [sometimes client has decreased activities]  Stress Interventions Other (Comment)  [client has stress in managing daily needs]        Care Coordination Interventions:  Yes, provided   Interventions Today    Flowsheet Row Most Recent Value  Chronic Disease   Chronic disease during today's visit Other  [spoke with Debra Klein, mother of client, about client needs]  General Interventions   General Interventions Discussed/Reviewed General Interventions Discussed, Community Resources  Education Interventions   Education Provided Provided Education  Provided Verbal Education On [employer]  Mental Health Interventions   Mental Health Discussed/Reviewed Coping Strategies  Nutrition Interventions   Nutrition Discussed/Reviewed Nutrition Discussed  Pharmacy Interventions   Pharmacy Dicussed/Reviewed Pharmacy Topics Discussed  Safety Interventions   Safety Discussed/Reviewed Fall Risk        Follow up plan: LCSW has provided Debra Klein with LCSW name and phone number. LCSW has encouraged Debra Klein  to call LCSW as needed for SW support for client at [phone]   Encounter Outcome:  Patient Visit Completed    Lorna Few  MSW, LCSW [place] Licensed Clinical Social Worker Direct Dial:  [phone] Fax:  [phone] Website:  [email or web address]

## 2023-04-24 IMAGING — DX DG CHEST 1V PORT
1 series · 1 of 1 positions shown · non-contrast
Comparison: 06/29/2021

CLINICAL DATA: Altered mental status.  Sepsis.  Status post MVR.

EXAM:
PORTABLE CHEST 1 VIEW

[chest ap]
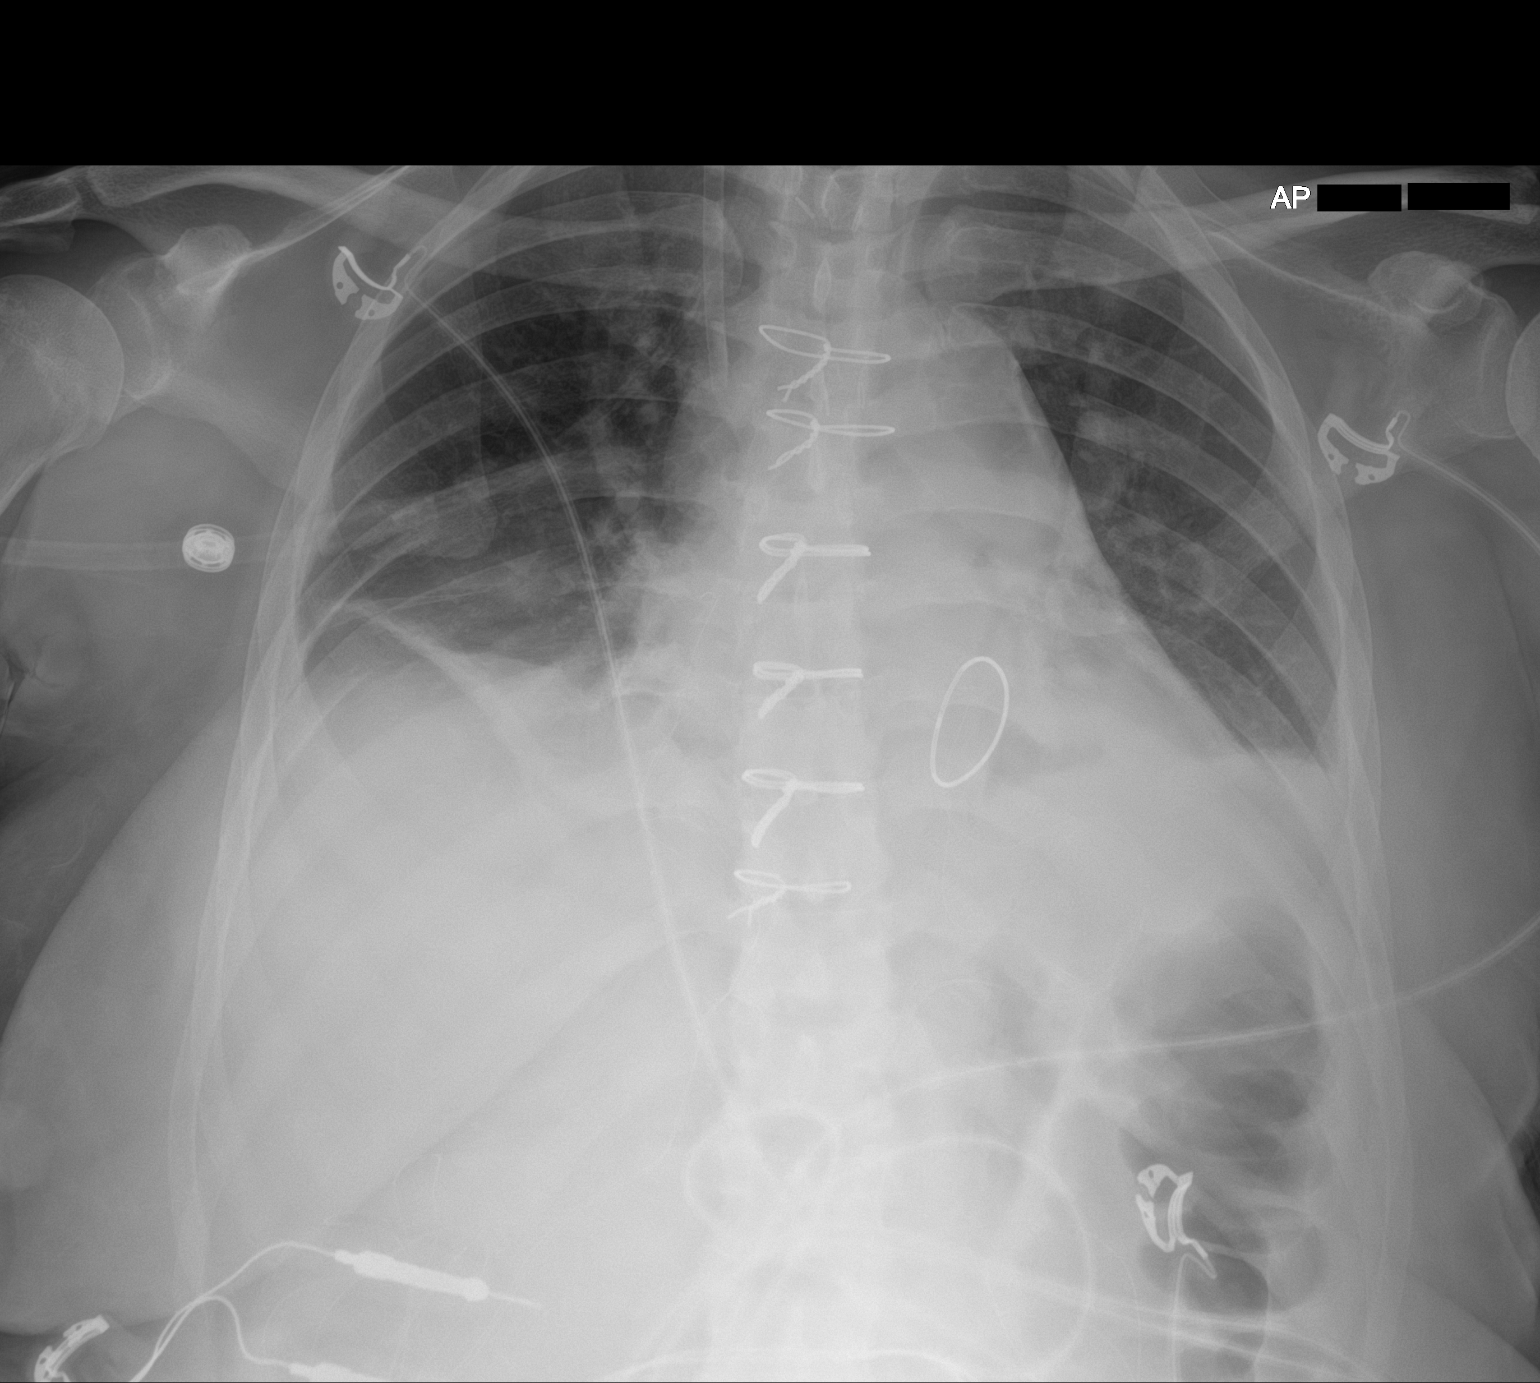

[1 of 1 positions shown; findings below may reference images not displayed]

FINDINGS: Status post median sternotomy and aortic valve replacement.
RIGHT-sided Swan-Ganz catheter has been removed. RIGHT IJ sheath
remains in place. LEFT-sided chest tube has been removed.
Nasogastric tube and mediastinal drain also removed. There is no
pneumothorax. Focal areas of atelectasis seen at both lung bases.
Possible small RIGHT pleural effusion versus atelectasis. No
evidence for pulmonary edema.
IMPRESSION: Bibasilar atelectasis. Small RIGHT pleural effusion versus
atelectasis.

## 2023-04-25 IMAGING — DX DG CHEST 1V PORT
1 series · 1 of 1 positions shown · non-contrast
Comparison: Chest XR, most recently 06/30/2021.

CLINICAL DATA: AMS.  Sepsis.

EXAM:
PORTABLE CHEST 1 VIEW

[chest ap]
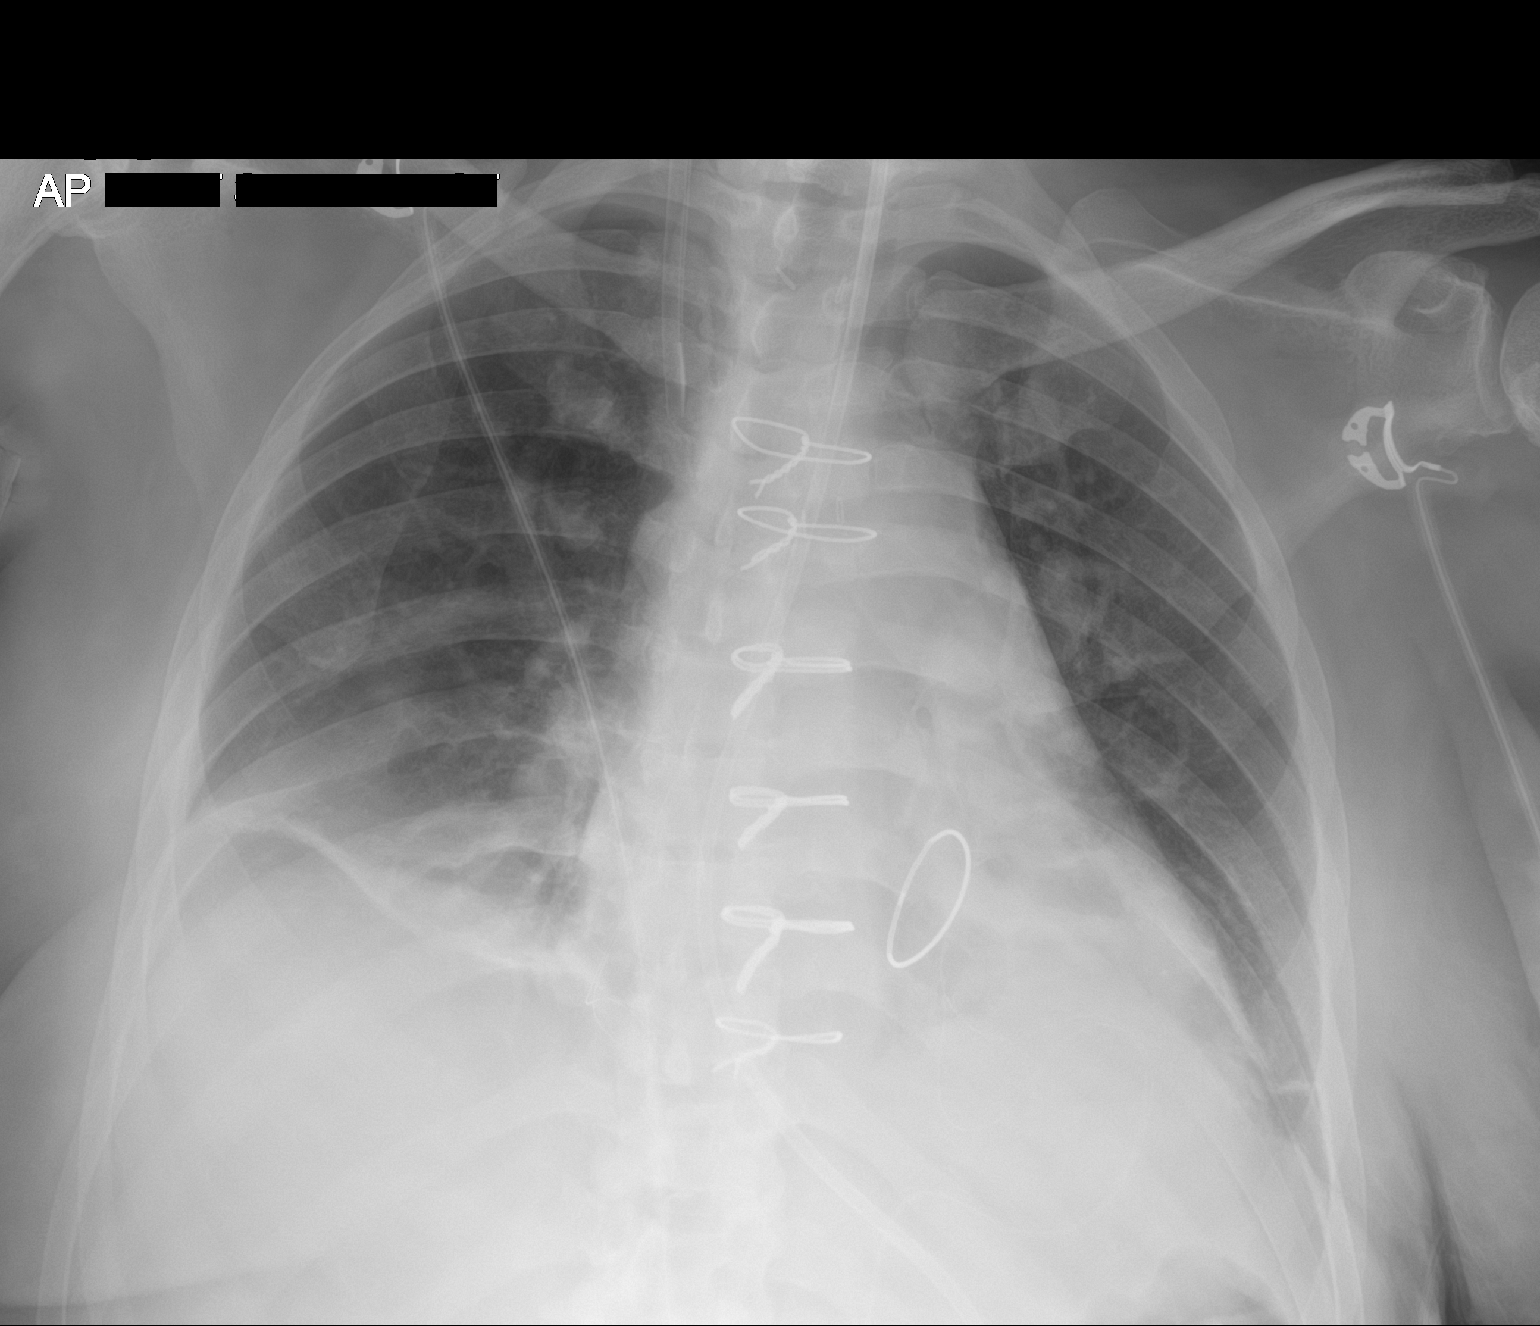

[1 of 1 positions shown; findings below may reference images not displayed]

FINDINGS: Support lines: RIGHT IJ sheath. Enteric feeding tube with tip
outside the field of view. Overlying cutaneous leads.

Cardiomediastinal silhouette is unchanged in within normal limits.
Aortic valvuloplasty. Similar degree of inflation with bibasilar
linear and patchy perihilar opacities. No new or additional
consolidation. No large pleural effusion or pneumothorax. Median
sternotomy. No interval osseous abnormality.
IMPRESSION: 1. Lines and tubes as above
2. Unchanged pulmonary findings, with trace bibasilar atelectasis.

## 2023-06-09 ENCOUNTER — Other Ambulatory Visit: Payer: Self-pay | Admitting: Internal Medicine

## 2023-06-23 DIAGNOSIS — D649 Anemia, unspecified: Secondary | ICD-10-CM | POA: Diagnosis not present

## 2023-06-23 DIAGNOSIS — I10 Essential (primary) hypertension: Secondary | ICD-10-CM | POA: Diagnosis not present

## 2023-06-23 DIAGNOSIS — E7800 Pure hypercholesterolemia, unspecified: Secondary | ICD-10-CM | POA: Diagnosis not present

## 2023-06-23 DIAGNOSIS — R7303 Prediabetes: Secondary | ICD-10-CM | POA: Diagnosis not present

## 2023-06-25 DIAGNOSIS — R809 Proteinuria, unspecified: Secondary | ICD-10-CM | POA: Diagnosis not present

## 2023-06-25 DIAGNOSIS — R8290 Unspecified abnormal findings in urine: Secondary | ICD-10-CM | POA: Diagnosis not present

## 2023-06-25 DIAGNOSIS — Z8673 Personal history of transient ischemic attack (TIA), and cerebral infarction without residual deficits: Secondary | ICD-10-CM | POA: Diagnosis not present

## 2023-06-25 DIAGNOSIS — R7303 Prediabetes: Secondary | ICD-10-CM | POA: Diagnosis not present

## 2023-06-25 DIAGNOSIS — K219 Gastro-esophageal reflux disease without esophagitis: Secondary | ICD-10-CM | POA: Diagnosis not present

## 2023-06-25 DIAGNOSIS — E7800 Pure hypercholesterolemia, unspecified: Secondary | ICD-10-CM | POA: Diagnosis not present

## 2023-06-25 DIAGNOSIS — I6990 Unspecified sequelae of unspecified cerebrovascular disease: Secondary | ICD-10-CM | POA: Diagnosis not present

## 2023-06-25 DIAGNOSIS — D649 Anemia, unspecified: Secondary | ICD-10-CM | POA: Diagnosis not present

## 2023-06-25 DIAGNOSIS — I10 Essential (primary) hypertension: Secondary | ICD-10-CM | POA: Diagnosis not present

## 2023-06-25 DIAGNOSIS — L309 Dermatitis, unspecified: Secondary | ICD-10-CM | POA: Diagnosis not present

## 2023-06-25 DIAGNOSIS — R059 Cough, unspecified: Secondary | ICD-10-CM | POA: Diagnosis not present

## 2023-07-13 ENCOUNTER — Encounter (HOSPITAL_COMMUNITY): Payer: Self-pay | Admitting: Internal Medicine

## 2023-07-13 NOTE — Progress Notes (Signed)
 Attempted to obtain medical history for pre op call via telephone, unable to reach at this time. HIPAA compliant voicemail message left requesting return call to pre surgical testing department.

## 2023-07-15 NOTE — Progress Notes (Signed)
 HPI: Follow-up mitral valve repair (in June 2023 patient found to have sepsis/bacteremia with left knee septic arthritis and mitral valve endocarditis; also noted to have CVA; had mitral valve repair at that time).  Note preoperative carotid Dopplers near normal bilaterally.  Follow-up echocardiogram November 2023 showed normal LV function, status post mitral valve repair with mean gradient 6 mmHg and mild to moderate mitral regurgitation.  ABIs June 2024 normal.  Also with history of hypertension, hyperlipidemia, schizophrenia.  Since last seen patient denies dyspnea, chest pain, palpitations or syncope.  Current Outpatient Medications  Medication Sig Dispense Refill   aspirin  81 MG chewable tablet Chew 1 tablet (81 mg total) by mouth daily.     aspirin  81 MG chewable tablet Chew 81 mg by mouth daily.     atorvastatin  (LIPITOR) 20 MG tablet Take 20 mg by mouth daily.     benztropine  (COGENTIN ) 0.5 MG tablet Take 0.5 mg by mouth at bedtime.     clobetasol ointment (TEMOVATE) 0.05 % Apply 1 Application topically 2 (two) times daily.     dextromethorphan (DELSYM) 30 MG/5ML liquid Take 30 mg by mouth as needed.     fluPHENAZine  (PROLIXIN ) 2.5 MG tablet Take 2.5 mg by mouth at bedtime.     hydrochlorothiazide (HYDRODIURIL) 25 MG tablet Take 25 mg by mouth daily.     Potassium Chloride  ER 20 MEQ TBCR Take 1 tablet by mouth every other day.     traZODone (DESYREL) 100 MG tablet Take 100 mg by mouth at bedtime.     acetaminophen  (TYLENOL ) 500 MG tablet Take 1,000 mg by mouth every 6 (six) hours as needed for mild pain. (Patient not taking: Reported on 07/22/2023)     cloZAPine  (CLOZARIL ) 100 MG tablet Take 300 mg by mouth at bedtime. (Patient not taking: Reported on 07/22/2023)     iron  polysaccharides (NIFEREX) 150 MG capsule Take 150 mg by mouth 2 (two) times daily.     vitamin B-12 1000 MCG tablet Take 1 tablet (1,000 mcg total) by mouth daily.     No current facility-administered medications  for this visit.     Past Medical History:  Diagnosis Date   CVA (cerebral vascular accident) (HCC)    Hyperlipemia    Hypertension    IDA (iron  deficiency anemia)    Schizophrenia (HCC)     Past Surgical History:  Procedure Laterality Date   COLONOSCOPY N/A 07/20/2023   Procedure: COLONOSCOPY;  Surgeon: Kriss Estefana DEL, DO;  Location: [facility];  Service: Gastroenterology;  Laterality: N/A;   KNEE ARTHROSCOPY Left 06/24/2021   Procedure: ARTHROSCOPY LEFT KNEE IRRIGRATION AND DEBRIEDMENT AND SYNOVECTOMY;  Surgeon: Vernetta Lonni GRADE, MD;  Location: [facility] OR;  Service: Orthopedics;  Laterality: Left;   MITRAL VALVE REPAIR N/A 06/27/2021   Procedure: MITRAL VALVE REPAIR USING SIMUFORM SEMI-RIGID ANNULOPLASTY RING;  Surgeon: Kerrin Elspeth BROCKS, MD;  Location: [facility] OR;  Service: Open Heart Surgery;  Laterality: N/A;   No previous surgery     TEE WITHOUT CARDIOVERSION N/A 06/27/2021   Procedure: TRANSESOPHAGEAL ECHOCARDIOGRAM (TEE);  Surgeon: Kerrin Elspeth BROCKS, MD;  Location: [facility] OR;  Service: Open Heart Surgery;  Laterality: N/A;    Social History   Socioeconomic History   Marital status: Single    Spouse name: Not on file   Number of children: Not on file   Years of education: Not on file   Highest education level: Not on file  Occupational History   Not on file  Tobacco Use   Smoking status: Never   Smokeless tobacco: Never  Substance and Sexual Activity   Alcohol use: Not Currently   Drug use: Not Currently   Sexual activity: Not Currently  Other Topics Concern   Not on file  Social History Narrative   Not on file   Social Drivers of Health   Financial Resource Strain: Not on file  Food Insecurity: No Food Insecurity (04/15/2022)   Hunger Vital Sign    Worried About Running Out of Food in the Last Year: Never true    Ran Out of Food in the Last Year: Never true  Transportation Needs: No Transportation Needs (04/15/2022)   PRAPARE - Doctor, general practice (Medical): No    Lack of Transportation (Non-Medical): No  Physical Activity: Inactive (04/13/2023)   Exercise Vital Sign    Days of Exercise per Week: 0 days    Minutes of Exercise per Session: 0 min  Stress: Stress Concern Present (04/13/2023)   [employer] of Occupational Health - Occupational Stress Questionnaire    Feeling of Stress : To some extent  Social Connections: Not on file  Intimate Partner Violence: Not on file    History reviewed. No pertinent family history.  ROS: no fevers or chills, productive cough, hemoptysis, dysphasia, odynophagia, melena, hematochezia, dysuria, hematuria, rash, seizure activity, orthopnea, PND, pedal edema, claudication. Remaining systems are negative.  Physical Exam: Well-developed well-nourished in no acute distress.  Skin is warm and dry.  HEENT is normal.  Neck is supple.  Chest is clear to auscultation with normal expansion.  Cardiovascular exam is regular rate and rhythm.  Abdominal exam nontender or distended. No masses palpated. Extremities show no edema. neuro grossly intact  EKG Interpretation Date/Time:  Thursday July 22 2023 08:59:35 EDT Ventricular Rate:  54 PR Interval:  148 QRS Duration:  82 QT Interval:  486 QTC Calculation: 460 R Axis:   50  Text Interpretation: Sinus bradycardia Possible Left atrial enlargement Confirmed by Pietro Rogue ([id number]) on 07/22/2023 9:06:48 AM    A/P  1 status post mitral valve repair-most recent echocardiogram showed mild to moderate mitral regurgitation.  Will plan repeat study.  Continue SBE prophylaxis.  2 hypertension-patient's blood pressure is controlled.  Continue present medical regimen.  Potassium and renal function monitored by primary care.  3 hyperlipidemia-continue statin.  Lipids and liver monitored by primary care.  4 history of CVA-felt secondary to embolus at time of endocarditis.  Rogue Pietro, MD

## 2023-07-20 ENCOUNTER — Other Ambulatory Visit: Payer: Self-pay

## 2023-07-20 ENCOUNTER — Encounter (HOSPITAL_COMMUNITY)
Admission: RE | Disposition: A | Discharge status: Home / Self Care | Payer: Self-pay | Source: Home / Self Care | Attending: Internal Medicine

## 2023-07-20 ENCOUNTER — Ambulatory Visit (HOSPITAL_COMMUNITY): Admitting: Anesthesiology

## 2023-07-20 ENCOUNTER — Encounter (HOSPITAL_COMMUNITY): Payer: Self-pay | Admitting: Internal Medicine

## 2023-07-20 ENCOUNTER — Ambulatory Visit (HOSPITAL_COMMUNITY)
Admission: RE | Admit: 2023-07-20 | Discharge: 2023-07-20 | Disposition: A | Discharge status: Home / Self Care | Attending: Internal Medicine | Admitting: Internal Medicine

## 2023-07-20 DIAGNOSIS — I10 Essential (primary) hypertension: Secondary | ICD-10-CM | POA: Insufficient documentation

## 2023-07-20 DIAGNOSIS — M1990 Unspecified osteoarthritis, unspecified site: Secondary | ICD-10-CM | POA: Insufficient documentation

## 2023-07-20 DIAGNOSIS — D125 Benign neoplasm of sigmoid colon: Secondary | ICD-10-CM | POA: Insufficient documentation

## 2023-07-20 DIAGNOSIS — I69351 Hemiplegia and hemiparesis following cerebral infarction affecting right dominant side: Secondary | ICD-10-CM | POA: Insufficient documentation

## 2023-07-20 DIAGNOSIS — D120 Benign neoplasm of cecum: Secondary | ICD-10-CM | POA: Diagnosis not present

## 2023-07-20 DIAGNOSIS — K635 Polyp of colon: Secondary | ICD-10-CM | POA: Diagnosis not present

## 2023-07-20 DIAGNOSIS — Z1211 Encounter for screening for malignant neoplasm of colon: Secondary | ICD-10-CM | POA: Insufficient documentation

## 2023-07-20 DIAGNOSIS — D124 Benign neoplasm of descending colon: Secondary | ICD-10-CM | POA: Diagnosis not present

## 2023-07-20 DIAGNOSIS — I63412 Cerebral infarction due to embolism of left middle cerebral artery: Secondary | ICD-10-CM | POA: Diagnosis not present

## 2023-07-20 HISTORY — PX: COLONOSCOPY: SHX5424

## 2023-07-20 SURGERY — COLONOSCOPY
Anesthesia: Monitor Anesthesia Care

## 2023-07-20 MED ORDER — GLYCOPYRROLATE PF 0.2 MG/ML IJ SOSY
PREFILLED_SYRINGE | INTRAMUSCULAR | Status: DC | PRN
Start: 2023-07-20 — End: 2023-07-20
  Administered 2023-07-20: .1 mg via INTRAVENOUS

## 2023-07-20 MED ORDER — PROPOFOL 10 MG/ML IV BOLUS
INTRAVENOUS | Status: DC | PRN
  Administered 2023-07-20: 115 ug/kg/min via INTRAVENOUS
  Administered 2023-07-20: 50 mg via INTRAVENOUS

## 2023-07-20 MED ORDER — SODIUM CHLORIDE 0.9 % IV SOLN
INTRAVENOUS | Status: AC | PRN
  Administered 2023-07-20: 500 mL via INTRAMUSCULAR

## 2023-07-20 MED ORDER — SODIUM CHLORIDE 0.9 % IV SOLN: INTRAVENOUS | Status: DC | PRN

## 2023-07-20 MED ORDER — PROPOFOL 1000 MG/100ML IV EMUL
INTRAVENOUS | Status: AC
  Filled 2023-07-20: qty 100

## 2023-07-20 MED ORDER — SODIUM CHLORIDE 0.9 % IV SOLN: INTRAVENOUS | Status: DC

## 2023-07-20 MED ORDER — LIDOCAINE 2% (20 MG/ML) 5 ML SYRINGE
INTRAMUSCULAR | Status: DC | PRN
  Administered 2023-07-20: 100 mg via INTRAVENOUS

## 2023-07-20 NOTE — Anesthesia Procedure Notes (Signed)
 Procedure Name: MAC Date/Time: 07/20/2023 8:10 AM  Performed by: Obadiah Reyes BROCKS, CRNAPre-anesthesia Checklist: Patient identified, Emergency Drugs available, Suction available, Timeout performed and Patient being monitored Patient Re-evaluated:Patient Re-evaluated prior to induction Oxygen  Delivery Method: Simple face mask Preoxygenation: Pre-oxygenation with 100% oxygen  Induction Type: IV induction

## 2023-07-20 NOTE — H&P (Signed)
 [facility]  Subjective: Debra Klein is a 48 y.o. female who presents for colonoscopy for colorectal cancer screening.  No prior colonoscopy.  No current alarm symptoms.  Has medical history significant for schizophrenia as well as CVA in June 2023 with hemiparesis and expressive aphasia.  Her mother is her power of attorney.  No family history colon cancer.  Mother notes that patient tolerated bowel preparation.  Objective: General: Awake and alert, non-toxic in appearance Cardio: Regular rate and rhythm  Pulm: Clear to auscultation, no conversational dyspnea Abdomen: Soft, non-tender to palpation, bowel sounds appreciated    Assessment:  Colon cancer screening  Plan:  - Recommend colonoscopy, discussed procedure with patient and patient's mother, discussed benefits, alternatives and risks of bleeding/infection/perforation/missed lesion/anesthesia, she verbalized understanding elected to proceed - Further recommendations to follow pending procedure  Debra Keas, DO [facility]

## 2023-07-20 NOTE — Discharge Instructions (Signed)

## 2023-07-20 NOTE — Op Note (Signed)
 [facility] Patient Name: Debra Klein Procedure Date: 07/20/2023 MRN: [id number] Attending MD: Estefana Keas DO, DO, [id number] Date of Birth: 1975-11-11 CSN: [id number] Age: 48 Admit Type: Outpatient Procedure:                Colonoscopy Indications:              Screening for colorectal malignant neoplasm, This                            is the patient's first colonoscopy Providers:                Estefana Keas DO, DO, Darleene Bare, RN, Coye Bade, Technician Referring MD:              Medicines:                See the Anesthesia note for documentation of the                            administered medications Complications:            No immediate complications. Estimated Blood Loss:     Estimated blood loss was minimal. Procedure:                Pre-Anesthesia Assessment:                           - ASA Grade Assessment: III - A patient with severe                            systemic disease.                           - The risks and benefits of the procedure and the                            sedation options and risks were discussed with the                            patient. All questions were answered and informed                            consent was obtained.                           After obtaining informed consent, the colonoscope                            was passed under direct vision. Throughout the                            procedure, the patient's blood pressure, pulse, and                            oxygen  saturations were monitored continuously. The  CF-HQ190L ([id number]) Olympus colonoscope was                            introduced through the anus and advanced to the the                            terminal ileum, with identification of the                            appendiceal orifice and IC valve. The colonoscopy                            was performed without difficulty. The  patient                            tolerated the procedure well. The quality of the                            bowel preparation was evaluated using the BBPS                            [facility] Bowel Preparation Scale) with scores of:                            Right Colon = 3, Transverse Colon = 3 and Left                            Colon = 3 (entire mucosa seen well with no residual                            staining, small fragments of stool or opaque                            liquid). The total BBPS score equals 9. Scope In: 8:22:04 AM Scope Out: 8:41:23 AM Scope Withdrawal Time: 0 hours 16 minutes 18 seconds  Total Procedure Duration: 0 hours 19 minutes 19 seconds  Findings:      The perianal and digital rectal examinations were normal.      A 3 mm polyp was found in the cecum. The polyp was sessile. The polyp       was removed with a cold snare. Resection and retrieval were complete.      Two sessile polyps were found in the descending colon. The polyps were 2       to 3 mm in size. These polyps were removed with a cold snare. Resection       and retrieval were complete.      A diminutive polyp was found in the sigmoid colon. The polyp was       sessile. The polyp was removed with a cold biopsy forceps. Resection and       retrieval were complete.      The terminal ileum appeared normal. Impression:               - One 3 mm polyp in the cecum, removed with a cold  snare. Resected and retrieved.                           - Two 2 to 3 mm polyps in the descending colon,                            removed with a cold snare. Resected and retrieved.                           - One diminutive polyp in the sigmoid colon,                            removed with a cold biopsy forceps. Resected and                            retrieved.                           - The examined portion of the ileum was normal. Moderate Sedation:      See the other procedure note  for documentation of moderate sedation with       intraservice time. Recommendation:           - Discharge patient to home.                           - Resume previous diet.                           - Continue present medications.                           - Await pathology results.                           - Repeat colonoscopy date to be determined after                            pending pathology results are reviewed for                            surveillance based on pathology results.                           - Return to GI office PRN.                           - Telephone GI office if symptomatic PRN.                           - Telephone GI office if patient not contacted for                            pathology results in 3 weeks. Procedure Code(s):        --- Professional ---  54614, Colonoscopy, flexible; with removal of                            tumor(s), polyp(s), or other lesion(s) by snare                            technique                           45380, 59, Colonoscopy, flexible; with biopsy,                            single or multiple Diagnosis Code(s):        --- Professional ---                           Z12.11, Encounter for screening for malignant                            neoplasm of colon                           D12.0, Benign neoplasm of cecum                           D12.5, Benign neoplasm of sigmoid colon                           D12.4, Benign neoplasm of descending colon CPT copyright 2022 American Medical Association. All rights reserved. The codes documented in this report are preliminary and upon coder review may  be revised to meet current compliance requirements. Dr Estefana Keas, DO Estefana Keas DO, DO 07/20/2023 9:05:35 AM Number of Addenda: 0

## 2023-07-20 NOTE — Transfer of Care (Signed)
 Immediate Anesthesia Transfer of Care Note  Patient: Debra Klein  Procedure(s) Performed: COLONOSCOPY  Patient Location: PACU  Anesthesia Type:MAC  Level of Consciousness: sedated and responds to stimulation  Airway & Oxygen  Therapy: Patient Spontanous Breathing and Patient connected to nasal cannula oxygen   Post-op Assessment: Report given to RN  Post vital signs: Reviewed and stable  Last Vitals:  Vitals Value Taken Time  BP    Temp    Pulse 53 07/20/23 08:47  Resp 21 07/20/23 08:47  SpO2 100 % 07/20/23 08:47  Vitals shown include unfiled device data.  Last Pain:  Vitals:   07/20/23 0715  TempSrc: Oral  PainSc: 0-No pain         Complications: No notable events documented.

## 2023-07-20 NOTE — Anesthesia Preprocedure Evaluation (Signed)
 Anesthesia Evaluation  Patient identified by MRN, date of birth, ID band Patient awake    Reviewed: Allergy & Precautions, H&P , NPO status , Patient's Chart, lab work & pertinent test results  Airway Mallampati: II   Neck ROM: full    Dental  (+) Dental Advisory Given   Pulmonary neg pulmonary ROS   breath sounds clear to auscultation       Cardiovascular hypertension, Pt. on medications + Valvular Problems/Murmurs MR  Rhythm:regular Rate:Normal  TEE: moderate MR with eccentric jet. LVEF normal.  There is a mobile vegetation on the posterior leaflet with concern for abscess as well.   Neuro/Psych  PSYCHIATRIC DISORDERS    Schizophrenia  Acute right side hemiparesis. CVA    GI/Hepatic   Endo/Other    Renal/GU      Musculoskeletal  (+) Arthritis , Osteoarthritis,    Abdominal   Peds  Hematology  (+) Blood dyscrasia, anemia   Anesthesia Other Findings   Reproductive/Obstetrics                              Anesthesia Physical Anesthesia Plan  ASA: 3  Anesthesia Plan: MAC   Post-op Pain Management:    Induction: Intravenous  PONV Risk Score and Plan: 2 and Treatment may vary due to age or medical condition and Propofol  infusion  Airway Management Planned: Simple Face Mask  Additional Equipment: None  Intra-op Plan:   Post-operative Plan:   Informed Consent: I have reviewed the patients History and Physical, chart, labs and discussed the procedure including the risks, benefits and alternatives for the proposed anesthesia with the patient or authorized representative who has indicated his/her understanding and acceptance.     Dental advisory given  Plan Discussed with: CRNA, Anesthesiologist and Surgeon  Anesthesia Plan Comments:          Anesthesia Quick Evaluation

## 2023-07-21 ENCOUNTER — Encounter (HOSPITAL_COMMUNITY): Payer: Self-pay | Admitting: Internal Medicine

## 2023-07-21 LAB — SURGICAL PATHOLOGY

## 2023-07-21 NOTE — Anesthesia Postprocedure Evaluation (Signed)
 Anesthesia Post Note  Patient: Debra Klein  Procedure(s) Performed: COLONOSCOPY     Patient location during evaluation: PACU Anesthesia Type: MAC Level of consciousness: awake and alert Pain management: pain level controlled Vital Signs Assessment: post-procedure vital signs reviewed and stable Respiratory status: spontaneous breathing, nonlabored ventilation and respiratory function stable Cardiovascular status: blood pressure returned to baseline and stable Postop Assessment: no apparent nausea or vomiting Anesthetic complications: no   No notable events documented.  Last Vitals:  Vitals:   07/20/23 0906 07/20/23 0910  BP: 128/80 135/66  Pulse: (!) 52 (!) 51  Resp: 17 (!) 21  Temp:    SpO2: 100% 100%    Last Pain:  Vitals:   07/20/23 0910  TempSrc:   PainSc: 0-No pain                 Butler Levander Pinal

## 2023-07-22 ENCOUNTER — Ambulatory Visit: Attending: Cardiology | Admitting: Cardiology

## 2023-07-22 ENCOUNTER — Encounter: Payer: Self-pay | Admitting: Cardiology

## 2023-07-22 VITALS — BP 126/78 | HR 54 | Ht 65.0 in | Wt 222.2 lb

## 2023-07-22 DIAGNOSIS — Z9889 Other specified postprocedural states: Secondary | ICD-10-CM | POA: Diagnosis not present

## 2023-07-22 DIAGNOSIS — E7800 Pure hypercholesterolemia, unspecified: Secondary | ICD-10-CM

## 2023-07-22 DIAGNOSIS — I10 Essential (primary) hypertension: Secondary | ICD-10-CM | POA: Diagnosis not present

## 2023-07-22 NOTE — Patient Instructions (Signed)
   Testing/Procedures:  Your physician has requested that you have an echocardiogram. Echocardiography is a painless test that uses sound waves to create images of your heart. It provides your doctor with information about the size and shape of your heart and how well your heart's chambers and valves are working. This procedure takes approximately one hour. There are no restrictions for this procedure. Please do NOT wear cologne, perfume, aftershave, or lotions (deodorant is allowed). Please arrive 15 minutes prior to your appointment time.  Please note: We ask at that you not bring children with you during ultrasound (echo/ vascular) testing. Due to room size and safety concerns, children are not allowed in the ultrasound rooms during exams. Our front office staff cannot provide observation of children in our lobby area while testing is being conducted. An adult accompanying a patient to their appointment will only be allowed in the ultrasound room at the discretion of the ultrasound technician under special circumstances. We apologize for any inconvenience. [address]  Follow-Up: At [facility], you and your health needs are our priority.  As part of our continuing mission to provide you with exceptional heart care, our providers are all part of one team.  This team includes your primary Cardiologist (physician) and Advanced Practice Providers or APPs (Physician Assistants and Nurse Practitioners) who all work together to provide you with the care you need, when you need it.  Your next appointment:   12 month(s)  Provider:   Alexandria Angel, MD

## 2023-09-01 ENCOUNTER — Ambulatory Visit (HOSPITAL_COMMUNITY)
Admission: RE | Admit: 2023-09-01 | Discharge: 2023-09-01 | Disposition: A | Discharge status: Home / Self Care | Source: Ambulatory Visit | Attending: Cardiovascular Disease | Admitting: Cardiovascular Disease

## 2023-09-01 DIAGNOSIS — E785 Hyperlipidemia, unspecified: Secondary | ICD-10-CM | POA: Insufficient documentation

## 2023-09-01 DIAGNOSIS — Z8673 Personal history of transient ischemic attack (TIA), and cerebral infarction without residual deficits: Secondary | ICD-10-CM | POA: Diagnosis not present

## 2023-09-01 DIAGNOSIS — I083 Combined rheumatic disorders of mitral, aortic and tricuspid valves: Secondary | ICD-10-CM | POA: Insufficient documentation

## 2023-09-01 DIAGNOSIS — Z9889 Other specified postprocedural states: Secondary | ICD-10-CM | POA: Diagnosis not present

## 2023-09-01 DIAGNOSIS — I10 Essential (primary) hypertension: Secondary | ICD-10-CM | POA: Diagnosis not present

## 2023-09-02 LAB — ECHOCARDIOGRAM COMPLETE
Area-P 1/2: 2.05 cm2
MV VTI: 0.71 cm2
S' Lateral: 2.4 cm

## 2023-09-03 ENCOUNTER — Ambulatory Visit: Payer: Self-pay | Admitting: Cardiology

## 2023-09-07 NOTE — Telephone Encounter (Signed)
 Patient's mother is requesting a call back to review results.

## 2023-10-30 DIAGNOSIS — Z23 Encounter for immunization: Secondary | ICD-10-CM | POA: Diagnosis not present

## 2023-12-01 NOTE — Progress Notes (Signed)
 HPI: Follow-up mitral valve repair (in June 2023 patient found to have sepsis/bacteremia with left knee septic arthritis and mitral valve endocarditis; also noted to have CVA; had mitral valve repair at that time). Note preoperative carotid Dopplers near normal bilaterally. ABIs June 2024 normal. Also with history of hypertension, hyperlipidemia, schizophrenia.  Follow-up echocardiogram August 2025 showed normal LV function, mild left ventricular hypertrophy, flattened interventricular septum consistent with right ventricular volume overload, moderate right ventricular enlargement, severe pulmonary hypertension, mild left atrial enlargement, moderate right atrial enlargement, status post mitral valve repair with mild mitral regurgitation, mild to moderate mitral stenosis with mean gradient 5.9 mmHg, severe tricuspid regurgitation.  Since last seen patient denies dyspnea, chest pain, palpitations or syncope.  Current Outpatient Medications  Medication Sig Dispense Refill   aspirin  81 MG chewable tablet Chew 1 tablet (81 mg total) by mouth daily.     aspirin  81 MG chewable tablet Chew 81 mg by mouth daily.     atorvastatin  (LIPITOR) 20 MG tablet Take 20 mg by mouth daily.     benztropine  (COGENTIN ) 0.5 MG tablet Take 0.5 mg by mouth at bedtime.     clobetasol ointment (TEMOVATE) 0.05 % Apply 1 Application topically 2 (two) times daily.     dextromethorphan (DELSYM) 30 MG/5ML liquid Take 30 mg by mouth as needed.     fluPHENAZine  (PROLIXIN ) 2.5 MG tablet Take 2.5 mg by mouth at bedtime.     hydrochlorothiazide (HYDRODIURIL) 25 MG tablet Take 25 mg by mouth daily.     iron  polysaccharides (NIFEREX) 150 MG capsule Take 150 mg by mouth 2 (two) times daily.     Potassium Chloride  ER 20 MEQ TBCR Take 1 tablet by mouth every other day.     traZODone (DESYREL) 100 MG tablet Take 100 mg by mouth at bedtime.     vitamin B-12 1000 MCG tablet Take 1 tablet (1,000 mcg total) by mouth daily.      acetaminophen  (TYLENOL ) 500 MG tablet Take 1,000 mg by mouth every 6 (six) hours as needed for mild pain. (Patient not taking: Reported on 12/07/2023)     cloZAPine  (CLOZARIL ) 100 MG tablet Take 300 mg by mouth at bedtime. (Patient not taking: Reported on 12/07/2023)     No current facility-administered medications for this visit.     Past Medical History:  Diagnosis Date   CVA (cerebral vascular accident) (HCC)    Hyperlipemia    Hypertension    IDA (iron  deficiency anemia)    Schizophrenia (HCC)     Past Surgical History:  Procedure Laterality Date   COLONOSCOPY N/A 07/20/2023   Procedure: COLONOSCOPY;  Surgeon: Kriss Estefana DEL, DO;  Location: [facility];  Service: Gastroenterology;  Laterality: N/A;   KNEE ARTHROSCOPY Left 06/24/2021   Procedure: ARTHROSCOPY LEFT KNEE IRRIGRATION AND DEBRIEDMENT AND SYNOVECTOMY;  Surgeon: Vernetta Lonni GRADE, MD;  Location: [facility] OR;  Service: Orthopedics;  Laterality: Left;   MITRAL VALVE REPAIR N/A 06/27/2021   Procedure: MITRAL VALVE REPAIR USING SIMUFORM SEMI-RIGID ANNULOPLASTY RING;  Surgeon: Kerrin Elspeth BROCKS, MD;  Location: [facility] OR;  Service: Open Heart Surgery;  Laterality: N/A;   No previous surgery     TEE WITHOUT CARDIOVERSION N/A 06/27/2021   Procedure: TRANSESOPHAGEAL ECHOCARDIOGRAM (TEE);  Surgeon: Kerrin Elspeth BROCKS, MD;  Location: [facility] OR;  Service: Open Heart Surgery;  Laterality: N/A;    Social History   Socioeconomic History   Marital status: Single    Spouse name: Not on file  Number of children: Not on file   Years of education: Not on file   Highest education level: Not on file  Occupational History   Not on file  Tobacco Use   Smoking status: Never   Smokeless tobacco: Never  Substance and Sexual Activity   Alcohol use: Not Currently   Drug use: Not Currently   Sexual activity: Not Currently  Other Topics Concern   Not on file  Social History Narrative   Not on file   Social Drivers of Health    Financial Resource Strain: Not on file  Food Insecurity: No Food Insecurity (04/15/2022)   Hunger Vital Sign    Worried About Running Out of Food in the Last Year: Never true    Ran Out of Food in the Last Year: Never true  Transportation Needs: No Transportation Needs (04/15/2022)   PRAPARE - Administrator, Civil Service (Medical): No    Lack of Transportation (Non-Medical): No  Physical Activity: Inactive (04/13/2023)   Exercise Vital Sign    Days of Exercise per Week: 0 days    Minutes of Exercise per Session: 0 min  Stress: Stress Concern Present (04/13/2023)   [employer] of Occupational Health - Occupational Stress Questionnaire    Feeling of Stress : To some extent  Social Connections: Not on file  Intimate Partner Violence: Not on file    History reviewed. No pertinent family history.  ROS: no fevers or chills, productive cough, hemoptysis, dysphasia, odynophagia, melena, hematochezia, dysuria, hematuria, rash, seizure activity, orthopnea, PND, pedal edema, claudication. Remaining systems are negative.  Physical Exam: Well-developed well-nourished in no acute distress.  Skin is warm and dry.  HEENT is normal.  Neck is supple.  Chest is clear to auscultation with normal expansion.  Cardiovascular exam is regular rate and rhythm.  Abdominal exam nontender or distended. No masses palpated. Extremities show no edema. neuro grossly intact   A/P  1 status post mitral valve repair-follow-up echocardiogram showed mild to moderate mitral stenosis and mild mitral regurgitation.  Patient also noted to have right side enlargement, severe tricuspid regurgitation and severe pulmonary hypertension.  Question if this is secondary to previous mitral valve disease.  Note she also snores.  We will arrange pulmonary evaluation and sleep study.  Check chest x-ray and VQ scan.  Check ANA and sed rate.  2 hyperlipidemia-continue statin.  Check lipids and liver.  3  hypertension-blood pressure controlled.  Continue present medications.  Check potassium and renal function.  4 prior CVA-this was felt likely secondary to an embolic event at time of endocarditis.  Redell Shallow, MD

## 2023-12-07 ENCOUNTER — Encounter: Payer: Self-pay | Admitting: Cardiology

## 2023-12-07 ENCOUNTER — Ambulatory Visit: Attending: Cardiology | Admitting: Cardiology

## 2023-12-07 ENCOUNTER — Ambulatory Visit (HOSPITAL_COMMUNITY)
Admission: RE | Admit: 2023-12-07 | Discharge: 2023-12-07 | Disposition: A | Discharge status: Home / Self Care | Source: Ambulatory Visit | Attending: Cardiology | Admitting: Cardiology

## 2023-12-07 VITALS — BP 127/83 | HR 60 | Ht 65.0 in | Wt 220.0 lb

## 2023-12-07 DIAGNOSIS — R0683 Snoring: Secondary | ICD-10-CM

## 2023-12-07 DIAGNOSIS — I10 Essential (primary) hypertension: Secondary | ICD-10-CM | POA: Diagnosis not present

## 2023-12-07 DIAGNOSIS — I272 Pulmonary hypertension, unspecified: Secondary | ICD-10-CM

## 2023-12-07 DIAGNOSIS — E7800 Pure hypercholesterolemia, unspecified: Secondary | ICD-10-CM

## 2023-12-07 DIAGNOSIS — Z9889 Other specified postprocedural states: Secondary | ICD-10-CM

## 2023-12-07 NOTE — Patient Instructions (Signed)
   Testing/Procedures:  A chest x-ray takes a picture of the organs and structures inside the chest, including the heart, lungs, and blood vessels. This test can show several things, including, whether the heart is enlarges; whether fluid is building up in the lungs; and whether pacemaker / defibrillator leads are still in place. [address]  VQ SCAN AT [facility]  Follow-Up: At [facility], you and your health needs are our priority.  As part of our continuing mission to provide you with exceptional heart care, our providers are all part of one team.  This team includes your primary Cardiologist (physician) and Advanced Practice Providers or APPs (Physician Assistants and Nurse Practitioners) who all work together to provide you with the care you need, when you need it.  Your next appointment:   6 month(s)  Provider:   Redell Shallow, MD

## 2023-12-08 ENCOUNTER — Ambulatory Visit: Payer: Self-pay | Admitting: Cardiology

## 2023-12-08 LAB — COMPREHENSIVE METABOLIC PANEL WITH GFR
ALT: 12 IU/L (ref 0–32)
AST: 13 IU/L (ref 0–40)
Albumin: 4 g/dL (ref 3.9–4.9)
Alkaline Phosphatase: 88 IU/L (ref 41–116)
BUN/Creatinine Ratio: 13 (ref 9–23)
BUN: 12 mg/dL (ref 6–24)
Bilirubin Total: 0.4 mg/dL (ref 0.0–1.2)
CO2: 25 mmol/L (ref 20–29)
Calcium: 9.1 mg/dL (ref 8.7–10.2)
Chloride: 101 mmol/L (ref 96–106)
Creatinine, Ser: 0.95 mg/dL (ref 0.57–1.00)
Globulin, Total: 3.1 g/dL (ref 1.5–4.5)
Glucose: 88 mg/dL (ref 70–99)
Potassium: 3.5 mmol/L (ref 3.5–5.2)
Sodium: 141 mmol/L (ref 134–144)
Total Protein: 7.1 g/dL (ref 6.0–8.5)
eGFR: 74 mL/min/1.73 (ref >59)

## 2023-12-08 LAB — LIPID PANEL
Chol/HDL Ratio: 2.9 ratio (ref 0.0–4.4)
Cholesterol, Total: 174 mg/dL (ref 100–199)
HDL: 59 mg/dL (ref >39)
LDL Chol Calc (NIH): 103 mg/dL — ABNORMAL HIGH (ref 0–99)
Triglycerides: 59 mg/dL (ref 0–149)
VLDL Cholesterol Cal: 12 mg/dL (ref 5–40)

## 2023-12-08 LAB — SEDIMENTATION RATE: Sed Rate: 65 mm/h — AB (ref 0–32)

## 2023-12-08 LAB — ANA

## 2023-12-13 ENCOUNTER — Other Ambulatory Visit: Payer: Self-pay | Admitting: Cardiology

## 2023-12-13 DIAGNOSIS — I272 Pulmonary hypertension, unspecified: Secondary | ICD-10-CM

## 2023-12-15 ENCOUNTER — Encounter: Payer: Self-pay | Admitting: *Deleted

## 2023-12-16 ENCOUNTER — Ambulatory Visit (HOSPITAL_COMMUNITY): Admission: RE | Admit: 2023-12-16 | Discharge: 2023-12-16 | Attending: Cardiology | Admitting: Cardiology

## 2023-12-16 ENCOUNTER — Inpatient Hospital Stay (HOSPITAL_COMMUNITY): Admission: RE | Admit: 2023-12-16 | Discharge: 2023-12-16 | Attending: Cardiology | Admitting: Cardiology

## 2023-12-16 DIAGNOSIS — I272 Pulmonary hypertension, unspecified: Secondary | ICD-10-CM

## 2023-12-16 MED ORDER — TECHNETIUM TO 99M ALBUMIN AGGREGATED
4.4000 | Freq: Once | INTRAVENOUS | Status: AC | PRN
  Administered 2023-12-16: 4.4 via INTRAVENOUS

## 2023-12-21 ENCOUNTER — Ambulatory Visit: Payer: Self-pay | Admitting: Cardiology

## 2023-12-22 ENCOUNTER — Encounter: Payer: Self-pay | Admitting: *Deleted

## 2023-12-22 NOTE — Telephone Encounter (Signed)
 Letter of results sent to pt

## 2023-12-27 ENCOUNTER — Encounter: Payer: Self-pay | Admitting: Dermatology

## 2023-12-27 ENCOUNTER — Ambulatory Visit: Admitting: Dermatology

## 2023-12-27 VITALS — BP 94/64 | HR 56

## 2023-12-27 DIAGNOSIS — L810 Postinflammatory hyperpigmentation: Secondary | ICD-10-CM

## 2023-12-27 DIAGNOSIS — L209 Atopic dermatitis, unspecified: Secondary | ICD-10-CM

## 2023-12-27 MED ORDER — CLOBETASOL PROPIONATE 0.05 % EX OINT: TOPICAL_OINTMENT | CUTANEOUS | 6 refills | Status: AC

## 2023-12-27 MED ORDER — HYDROQUINONE 4 % EX CREA: TOPICAL_CREAM | Freq: Two times a day (BID) | CUTANEOUS | 6 refills | Status: AC

## 2023-12-27 NOTE — Patient Instructions (Addendum)
 VISIT SUMMARY:  During your visit, we discussed the persistent dryness and darkening of your hands. We identified that your condition is likely due to atopic dermatitis, which is being exacerbated by exposure to irritants such as dishwashing detergents. We have developed a treatment plan to help manage your symptoms and prevent further irritation.  YOUR PLAN:  -ATOPIC DERMATITIS OF THE HANDS WITH POSTINFLAMMATORY HYPERPIGMENTATION:  Atopic dermatitis is a condition that causes your skin to become dry, itchy, and inflamed. The darkening of your skin (hyperpigmentation) is a result of the inflammation. To manage this, you should apply clobetasol  ointment twice daily for two weeks, followed by a two-week break, and continue this cycle throughout the winter.   Additionally, use hydroquinone  cream at night in a four-month on, four-month off cycle to help with the hyperpigmentation.  If hydroquinone  is too expensive, you can use Eucerin Radiant Tone Dual Serum as an alternative.   Remember to wear gloves when washing dishes to prevent further irritation and use a strong moisturizer like Neutrogena Norwegian hand cream three times daily.   We also provided samples of face moisturizers to help with your facial dryness.  INSTRUCTIONS:  Please follow up in one year unless your symptoms persist or worsen.          Important Information  Due to recent changes in healthcare laws, you may see results of your pathology and/or laboratory studies on MyChart before the doctors have had a chance to review them. We understand that in some cases there may be results that are confusing or concerning to you. Please understand that not all results are received at the same time and often the doctors may need to interpret multiple results in order to provide you with the best plan of care or course of treatment. Therefore, we ask that you please give us  2 business days to thoroughly review all your results  before contacting the office for clarification. Should we see a critical lab result, you will be contacted sooner.   If You Need Anything After Your Visit  If you have any questions or concerns for your doctor, please call our main line at [phone] If no one answers, please leave a voicemail as directed and we will return your call as soon as possible. Messages left after 4 pm will be answered the following business day.   You may also send us  a message via MyChart. We typically respond to MyChart messages within 1-2 business days.  For prescription refills, please ask your pharmacy to contact our office. Our fax number is [phone].  If you have an urgent issue when the clinic is closed that cannot wait until the next business day, you can page your doctor at the number below.    Please note that while we do our best to be available for urgent issues outside of office hours, we are not available 24/7.   If you have an urgent issue and are unable to reach us , you may choose to seek medical care at your doctor's office, retail clinic, urgent care center, or emergency room.  If you have a medical emergency, please immediately call 911 or go to the emergency department. In the event of inclement weather, please call our main line at [phone] for an update on the status of any delays or closures.  Dermatology Medication Tips: Please keep the boxes that topical medications come in in order to help keep track of the instructions about where and how to use these. Pharmacies typically  print the medication instructions only on the boxes and not directly on the medication tubes.   If your medication is too expensive, please contact our office at [phone] or send us  a message through MyChart.   We are unable to tell what your co-pay for medications will be in advance as this is different depending on your insurance coverage. However, we may be able to find a substitute medication at  lower cost or fill out paperwork to get insurance to cover a needed medication.   If a prior authorization is required to get your medication covered by your insurance company, please allow us  1-2 business days to complete this process.  Drug prices often vary depending on where the prescription is filled and some pharmacies may offer cheaper prices.  The website www.goodrx.com contains coupons for medications through different pharmacies. The prices here do not account for what the cost may be with help from insurance (it may be cheaper with your insurance), but the website can give you the price if you did not use any insurance.  - You can print the associated coupon and take it with your prescription to the pharmacy.  - You may also stop by our office during regular business hours and pick up a GoodRx coupon card.  - If you need your prescription sent electronically to a different pharmacy, notify our office through [facility] or by phone at [phone]

## 2023-12-27 NOTE — Progress Notes (Signed)
" ° °  New Patient Visit  Patient (and/or pt guardian) consented to the use of AI-assisted tools for note generation.    Subjective  Debra Klein is a 48 y.o. female who presents for the following: Dermatitis - patient accompanied by mom Debra Klein  Located on bilateral hands that she would like to have examined.  Patient reports the areas have been there for a year She reports the areas are bothersome. Patient reports that hands are extremely dry and irritated. Patient rates itchiness 1 out of 10 for today  She states that the areas have not spread.  Patient reports she has previously been treated for these areas.  Patient was prescribed Clobetasol  ointment 0.05%. Patient reports she is using the Clobetasol  every other day to help with itchiness and uses it every other day Patient reports it is helping with the itch but hands are still very dry   The following portions of the chart were reviewed this encounter and updated as appropriate: medications, allergies, medical history  Review of Systems:  No other skin or systemic complaints except as noted in HPI or Assessment and Plan.  Objective  Well appearing patient in no apparent distress; mood and affect are within normal limits.  A focused examination was performed of the following areas: Bilateral Hands  Relevant exam findings are noted in the Assessment and Plan.    Assessment & Plan  ATOPIC DERMATITIS Exam: Scaly pink papules coalescing to plaques on bilateral hands  Atopic dermatitis of the hands with postinflammatory hyperpigmentation Chronic atopic dermatitis of the hands with associated postinflammatory hyperpigmentation. The condition is exacerbated by exposure to irritants such as dishwashing detergents, leading to inflammation and subsequent hyperpigmentation. The hyperpigmentation is a cosmetic concern and not medically necessary to treat. The condition is expected to improve with consistent treatment and preventive  measures. - Prescribed clobetasol  ointment for application twice daily for two weeks, followed by a two-week break, and continue this cycle throughout the winter. - Prescribed hydroquinone  cream for nighttime use, with a four-month on, four-month off cycle to prevent medication deposition and worsening of hyperpigmentation. - Recommended over-the-counter Eucerin Radiant Tone Dual Serum as an alternative to hydroquinone  if more affordable. - Advised wearing gloves when washing dishes to prevent further irritation and hyperpigmentation. - Recommended using a strong moisturizer such as Neutrogena Norwegian hand cream or P&G, applied three times daily. - Provided samples of face moisturizers to prevent facial dryness. - Scheduled follow-up in one year unless symptoms persist or worsen.   Recommend gentle skin care.  Post Inflammatory Hyperpigmentation Prescribed Hydroquinone  4% cream Recommended use of Eucerin Radiant Tone  Recommended use of Avene Tolerance and Avene Cicalfate and daily and nightly moisturizers  ATOPIC DERMATITIS, UNSPECIFIED TYPE   This Visit - clobetasol  ointment (TEMOVATE ) 0.05 % - Use topically twice daily for two weeks POSTINFLAMMATORY HYPERPIGMENTATION   This Visit - hydroquinone  4 % cream - Apply topically 2 (two) times daily.  Return in about 6 months (around 06/26/2024) for Atopic Dermatitis/PIH F/U.  [name], am acting as a neurosurgeon for [employer], DO .   Documentation: I have reviewed the above documentation for accuracy and completeness, and I agree with the above.  Delon Lenis, DO     "

## 2023-12-29 ENCOUNTER — Other Ambulatory Visit: Payer: Self-pay | Admitting: Family Medicine

## 2023-12-29 DIAGNOSIS — Z1231 Encounter for screening mammogram for malignant neoplasm of breast: Secondary | ICD-10-CM

## 2023-12-31 ENCOUNTER — Telehealth: Payer: Self-pay | Admitting: Cardiology

## 2023-12-31 MED ORDER — AMOXICILLIN 500 MG PO TABS: ORAL_TABLET | ORAL | 3 refills | Status: AC

## 2023-12-31 NOTE — Telephone Encounter (Signed)
 Per Agent - Pt had dental appointment today at 10am, pt is planning to reschedule for a later date. Pt seems to meet prophylaxis requirements. Reached out to DOD Chandrasekhar to review. Approved Amoxicillin.  Spoke with [name] per DPR. Medication ordered. [name] stated understanding.

## 2023-12-31 NOTE — Telephone Encounter (Signed)
 Pts mom calling to ask if she will need to take antibiotic before dental cleaning today. Please advise.

## 2024-01-25 ENCOUNTER — Ambulatory Visit
Admission: RE | Admit: 2024-01-25 | Discharge: 2024-01-25 | Disposition: A | Source: Ambulatory Visit | Attending: Family Medicine | Admitting: Family Medicine

## 2024-01-25 DIAGNOSIS — Z1231 Encounter for screening mammogram for malignant neoplasm of breast: Secondary | ICD-10-CM

## 2024-01-26 ENCOUNTER — Ambulatory Visit: Admitting: Pulmonary Disease

## 2024-01-28 ENCOUNTER — Other Ambulatory Visit: Payer: Self-pay | Admitting: Family Medicine

## 2024-01-28 DIAGNOSIS — R928 Other abnormal and inconclusive findings on diagnostic imaging of breast: Secondary | ICD-10-CM

## 2024-02-09 ENCOUNTER — Other Ambulatory Visit

## 2024-02-09 ENCOUNTER — Encounter

## 2024-02-22 ENCOUNTER — Ambulatory Visit: Admitting: Pulmonary Disease

## 2024-02-23 ENCOUNTER — Other Ambulatory Visit

## 2024-02-23 ENCOUNTER — Encounter
